# Patient Record
Sex: Female | Born: 1955 | Race: Black or African American | Hispanic: No | Marital: Single | Smoking: Former smoker
Health system: Southern US, Community
[De-identification: ages and names within clinical notes are randomized; demographics above are authoritative.]

## PROBLEM LIST (undated history)

## (undated) DIAGNOSIS — G4733 Obstructive sleep apnea (adult) (pediatric): Secondary | ICD-10-CM

## (undated) DIAGNOSIS — I10 Essential (primary) hypertension: Secondary | ICD-10-CM

## (undated) DIAGNOSIS — K219 Gastro-esophageal reflux disease without esophagitis: Secondary | ICD-10-CM

## (undated) DIAGNOSIS — Z9989 Dependence on other enabling machines and devices: Secondary | ICD-10-CM

## (undated) DIAGNOSIS — Z9289 Personal history of other medical treatment: Secondary | ICD-10-CM

## (undated) DIAGNOSIS — E7800 Pure hypercholesterolemia, unspecified: Secondary | ICD-10-CM

## (undated) DIAGNOSIS — K754 Autoimmune hepatitis: Secondary | ICD-10-CM

## (undated) DIAGNOSIS — M1990 Unspecified osteoarthritis, unspecified site: Secondary | ICD-10-CM

## (undated) DIAGNOSIS — E119 Type 2 diabetes mellitus without complications: Secondary | ICD-10-CM

## (undated) DIAGNOSIS — F419 Anxiety disorder, unspecified: Secondary | ICD-10-CM

## (undated) DIAGNOSIS — Z8489 Family history of other specified conditions: Secondary | ICD-10-CM

## (undated) DIAGNOSIS — Z87442 Personal history of urinary calculi: Secondary | ICD-10-CM

## (undated) DIAGNOSIS — I2510 Atherosclerotic heart disease of native coronary artery without angina pectoris: Secondary | ICD-10-CM

## (undated) DIAGNOSIS — J449 Chronic obstructive pulmonary disease, unspecified: Secondary | ICD-10-CM

## (undated) DIAGNOSIS — D649 Anemia, unspecified: Secondary | ICD-10-CM

## (undated) DIAGNOSIS — J189 Pneumonia, unspecified organism: Secondary | ICD-10-CM

## (undated) DIAGNOSIS — J45909 Unspecified asthma, uncomplicated: Secondary | ICD-10-CM

## (undated) HISTORY — PX: TOTAL KNEE ARTHROPLASTY: SHX125

## (undated) HISTORY — PX: CHOLECYSTECTOMY: SHX55

## (undated) HISTORY — PX: CARDIAC CATHETERIZATION: SHX172

## (undated) HISTORY — PX: JOINT REPLACEMENT: SHX530

## (undated) HISTORY — PX: SHOULDER OPEN ROTATOR CUFF REPAIR: SHX2407

## (undated) HISTORY — PX: CARPAL TUNNEL RELEASE: SHX101

## (undated) HISTORY — PX: BACK SURGERY: SHX140

## (undated) HISTORY — PX: SHOULDER ARTHROSCOPY WITH ROTATOR CUFF REPAIR: SHX5685

## (undated) HISTORY — PX: ABDOMINAL HYSTERECTOMY: SHX81

---

## 2010-05-23 HISTORY — PX: POSTERIOR FUSION LUMBAR SPINE: SUR632

## 2010-05-29 ENCOUNTER — Inpatient Hospital Stay (HOSPITAL_COMMUNITY): Admission: RE | Admit: 2010-05-29 | Discharge: 2010-06-02 | Payer: Self-pay | Admitting: Neurosurgery

## 2010-10-03 LAB — CBC
HCT: 31.8 % — ABNORMAL LOW (ref 36.0–46.0)
Hemoglobin: 10 g/dL — ABNORMAL LOW (ref 12.0–15.0)
MCH: 30 pg (ref 26.0–34.0)
MCHC: 32.3 g/dL (ref 30.0–36.0)
MCV: 92.9 fL (ref 78.0–100.0)
Platelets: 244 10*3/uL (ref 150–400)
RDW: 13.6 % (ref 11.5–15.5)
RDW: 13.9 % (ref 11.5–15.5)
WBC: 10.6 10*3/uL — ABNORMAL HIGH (ref 4.0–10.5)
WBC: 13.8 10*3/uL — ABNORMAL HIGH (ref 4.0–10.5)

## 2010-10-03 LAB — BASIC METABOLIC PANEL
BUN: 8 mg/dL (ref 6–23)
BUN: 9 mg/dL (ref 6–23)
CO2: 24 mEq/L (ref 19–32)
Chloride: 109 mEq/L (ref 96–112)
Creatinine, Ser: 0.76 mg/dL (ref 0.4–1.2)
GFR calc Af Amer: >60 mL/min (ref >60)
GFR calc non Af Amer: >60 mL/min (ref >60)
Potassium: 3.6 mEq/L (ref 3.5–5.1)
Sodium: 140 mEq/L (ref 135–145)

## 2010-10-03 LAB — ABO/RH: ABO/RH(D): O POS

## 2010-10-03 LAB — TYPE AND SCREEN: Antibody Screen: NEGATIVE

## 2010-10-03 LAB — GLUCOSE, CAPILLARY
Glucose-Capillary: 101 mg/dL — ABNORMAL HIGH (ref 70–99)
Glucose-Capillary: 109 mg/dL — ABNORMAL HIGH (ref 70–99)
Glucose-Capillary: 113 mg/dL — ABNORMAL HIGH (ref 70–99)
Glucose-Capillary: 125 mg/dL — ABNORMAL HIGH (ref 70–99)
Glucose-Capillary: 154 mg/dL — ABNORMAL HIGH (ref 70–99)
Glucose-Capillary: 200 mg/dL — ABNORMAL HIGH (ref 70–99)

## 2010-10-03 LAB — CARDIAC PANEL(CRET KIN+CKTOT+MB+TROPI)
CK, MB: 1.3 ng/mL (ref 0.3–4.0)
Relative Index: 0.2 (ref 0.0–2.5)
Relative Index: 0.2 (ref 0.0–2.5)
Total CK: 712 U/L — ABNORMAL HIGH (ref 7–177)
Troponin I: 0.01 ng/mL (ref 0.00–0.06)
Troponin I: 0.02 ng/mL (ref 0.00–0.06)

## 2010-10-04 LAB — CBC
HCT: 38.1 % (ref 36.0–46.0)
Hemoglobin: 12.1 g/dL (ref 12.0–15.0)
MCH: 30.1 pg (ref 26.0–34.0)
MCHC: 31.8 g/dL (ref 30.0–36.0)
RBC: 4.02 MIL/uL (ref 3.87–5.11)

## 2010-10-04 LAB — BASIC METABOLIC PANEL
CO2: 27 mEq/L (ref 19–32)
GFR calc non Af Amer: >60 mL/min (ref >60)
Glucose, Bld: 95 mg/dL (ref 70–99)
Potassium: 4.6 mEq/L (ref 3.5–5.1)
Sodium: 139 mEq/L (ref 135–145)

## 2010-10-04 LAB — SURGICAL PCR SCREEN: MRSA, PCR: NEGATIVE

## 2011-09-14 IMAGING — RF DG LUMBAR SPINE 2-3V
1 series · 2 of 2 positions shown · non-contrast
Comparison: Intraoperative radiographs dated 05/29/2010

CLINICAL DATA: Spondylolisthesis.  Spinal stenosis.

LUMBAR SPINE - 2-3 VIEW

[Series 1: run · 2 of 2 slices shown]
[im 1/2]
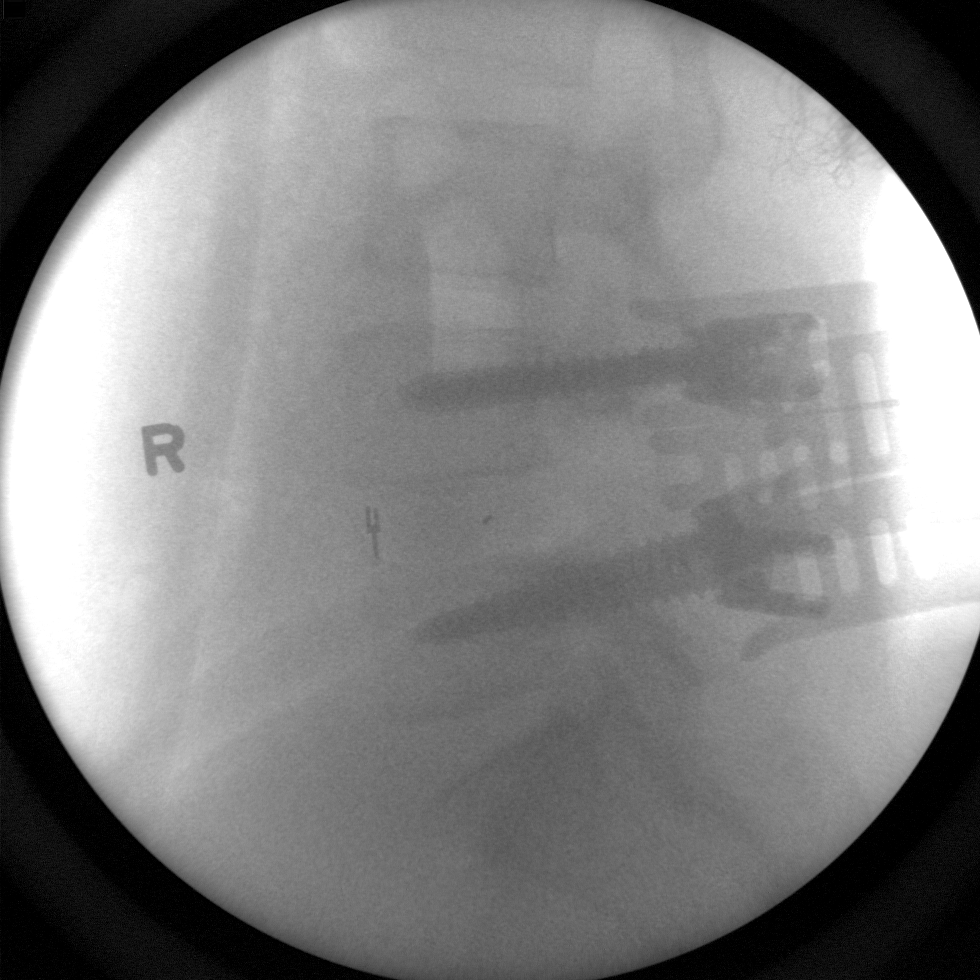
[im 2/2]
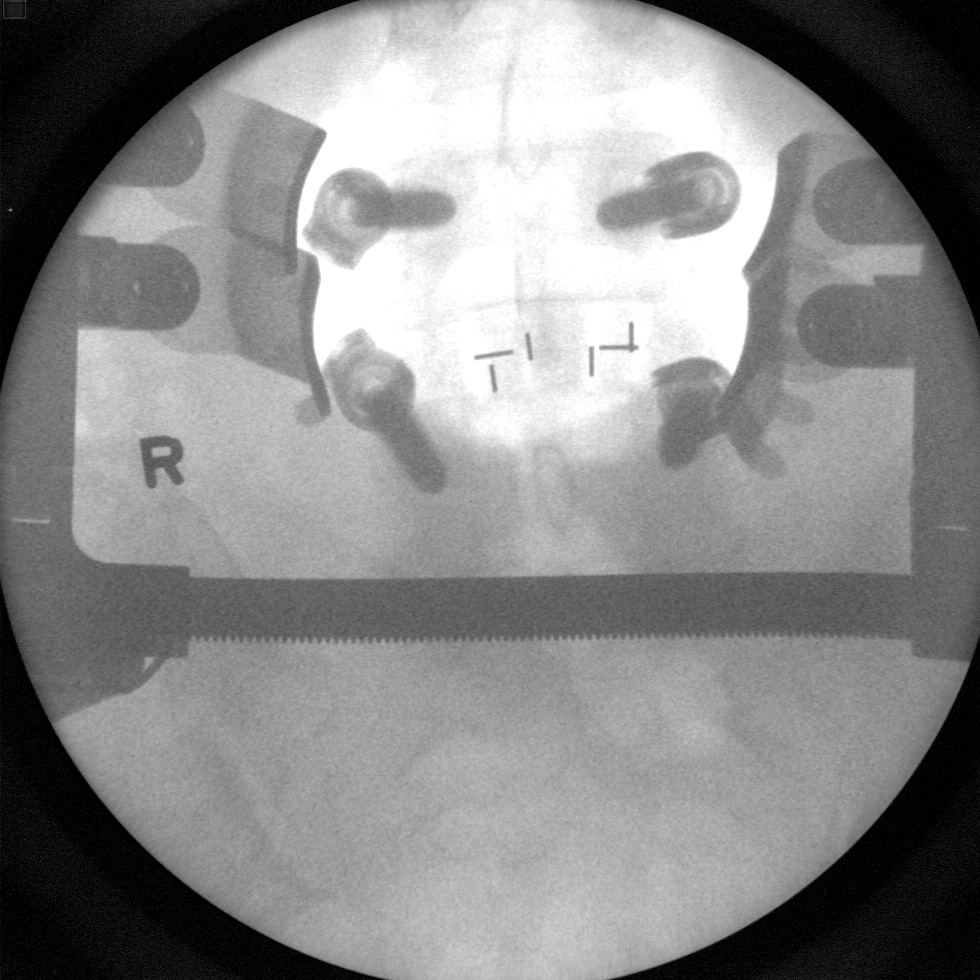

[2 of 2 positions shown; findings below may reference images not displayed]

FINDINGS: AP and lateral C-arm images demonstrate that the patient
has a interbody fusion device and interpedicular screws at L4-5.
Alignment appears anatomic at the L4-5 level.
IMPRESSION: Fusion being performed at L4-5.

## 2011-09-14 IMAGING — CR DG OR PORTABLE SPINE
1 series · 1 of 1 positions shown · non-contrast
Comparison: None.

CLINICAL DATA: L4-L5 laminectomy and PLIF.  Missing sponge.

PORTABLE SPINE

[view not recorded]
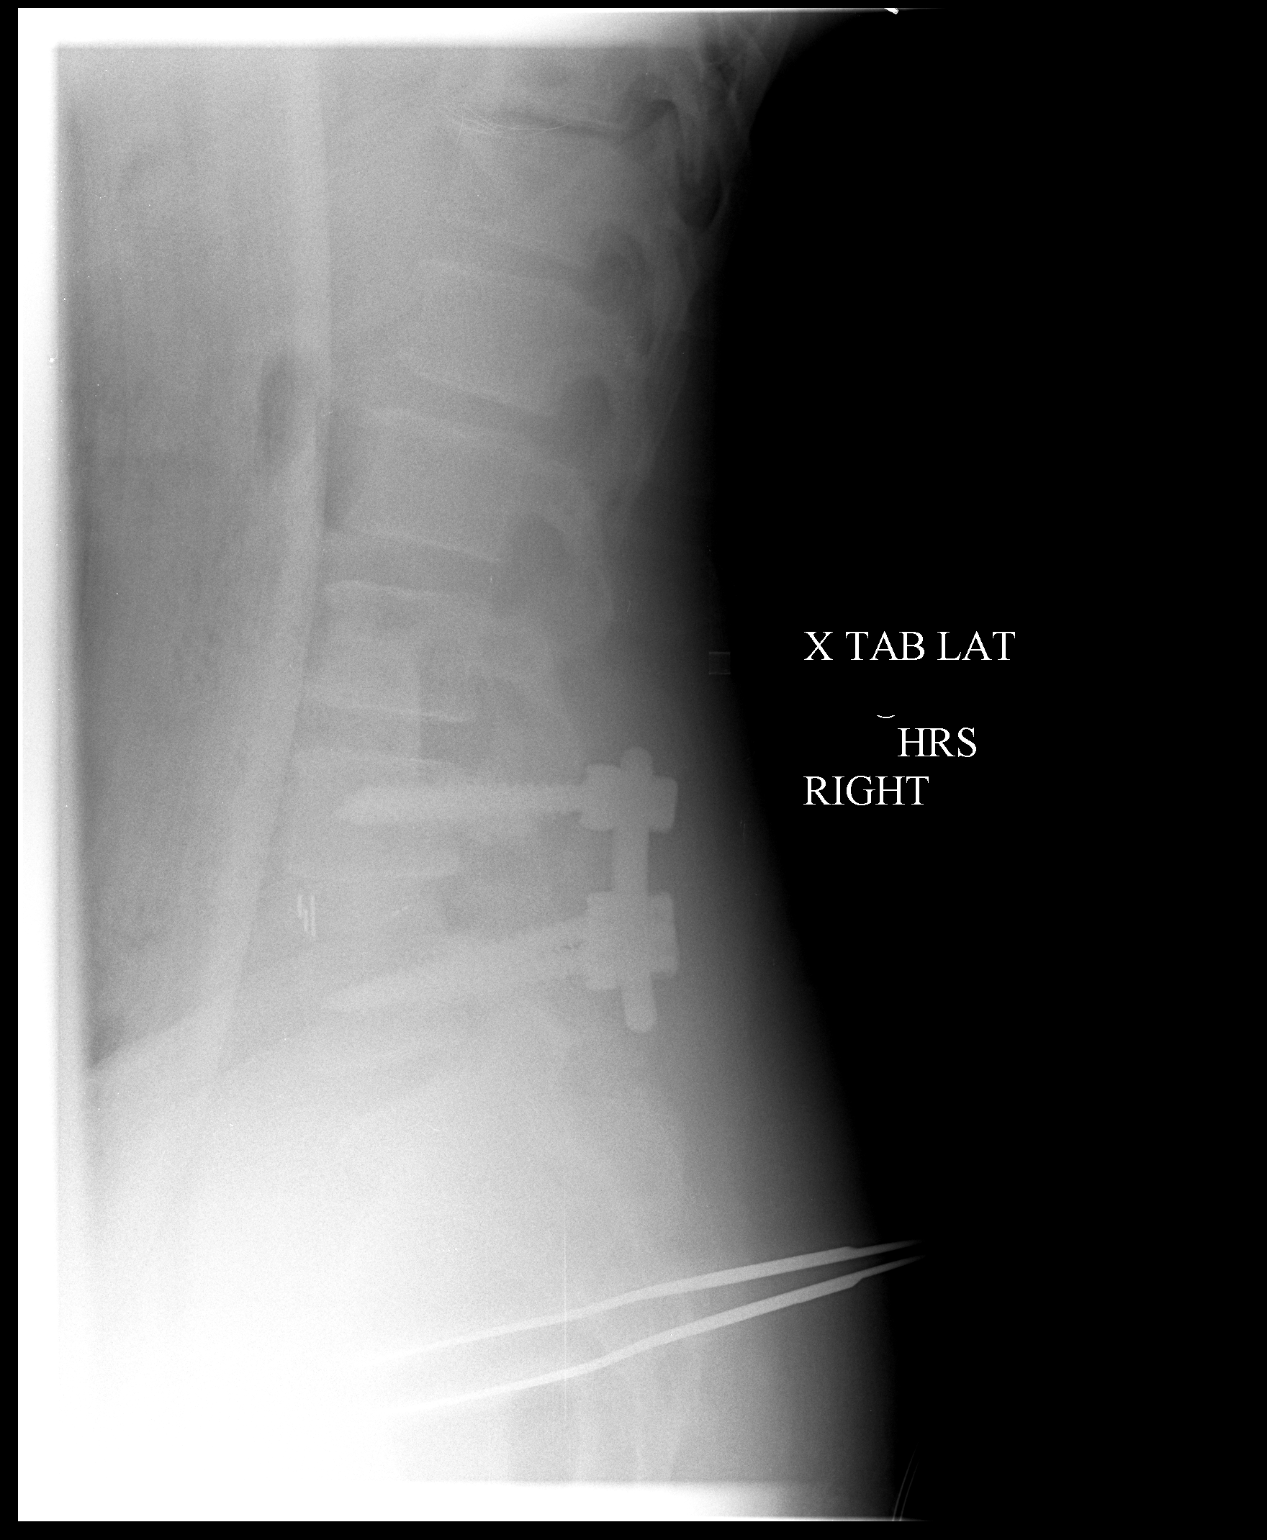

[1 of 1 positions shown; findings below may reference images not displayed]

FINDINGS: Five lumbar type vertebral bodies are assumed.  Cross-
table lateral view at 7995 hours demonstrates posterior lumbar and
interbody fusion at L4-L5.  The hardware appears well positioned.
No retained sponge is identified within the field of view.  An
instrument overlies the sacrum.
IMPRESSION: No evidence of retained surgical sponge status post L4-L5 PLIF.  An
instrument overlies the sacrum.

Results were called to the operating room at the time of
interpretation.  It was reported at that time that the sponge had
been found.

## 2013-07-23 HISTORY — PX: INCISION AND DRAINAGE: SHX5863

## 2013-07-23 HISTORY — PX: REVISION TOTAL KNEE ARTHROPLASTY: SUR1280

## 2014-09-06 ENCOUNTER — Ambulatory Visit: Payer: Self-pay

## 2015-09-27 ENCOUNTER — Encounter: Payer: Self-pay | Admitting: Internal Medicine

## 2015-09-27 ENCOUNTER — Ambulatory Visit (INDEPENDENT_AMBULATORY_CARE_PROVIDER_SITE_OTHER): Payer: Medicaid Other | Admitting: Internal Medicine

## 2015-09-27 VITALS — BP 133/82 | HR 71 | Temp 98.3°F

## 2015-09-27 DIAGNOSIS — T8453XA Infection and inflammatory reaction due to internal right knee prosthesis, initial encounter: Secondary | ICD-10-CM | POA: Diagnosis present

## 2015-09-27 DIAGNOSIS — E7800 Pure hypercholesterolemia, unspecified: Secondary | ICD-10-CM

## 2015-09-27 DIAGNOSIS — Z9049 Acquired absence of other specified parts of digestive tract: Secondary | ICD-10-CM | POA: Diagnosis not present

## 2015-09-27 DIAGNOSIS — I10 Essential (primary) hypertension: Secondary | ICD-10-CM

## 2015-09-27 DIAGNOSIS — I25812 Atherosclerosis of bypass graft of coronary artery of transplanted heart without angina pectoris: Secondary | ICD-10-CM | POA: Diagnosis not present

## 2015-09-27 DIAGNOSIS — I25810 Atherosclerosis of coronary artery bypass graft(s) without angina pectoris: Secondary | ICD-10-CM | POA: Insufficient documentation

## 2015-09-27 MED ORDER — SULFAMETHOXAZOLE-TRIMETHOPRIM 800-160 MG PO TABS: 1.0000 | ORAL_TABLET | Freq: Two times a day (BID) | ORAL | Status: DC

## 2015-09-27 NOTE — Addendum Note (Signed)
Addended by: Rejeana BrockMURRAY, CANDACE A on: 09/27/2015 02:52 PM   Modules accepted: Medications

## 2015-09-27 NOTE — Progress Notes (Addendum)
[facility]      Reason for Consult:prosthetic joint infection    Referring Physician: Dr. Adin Hector    Patient ID: Nancy Simmons, female    DOB: 11-Sep-1955, 60 y.o.   MRN: [id number]  HPI:   She comes in for evaluation of treatment for an early prosthetic joint infection.  She remotely had a right total knee arthroplasty done and then recently had progressively worsening pain, noting sensations of instability and giving way and xray noted some possible loosening.  An initial knee aspirate was negative for infection and she underwent revision right total knee arthroplasty and ORIF of intraoperative medical tibial plateau fracture.  She initially was doing well and in rehab but one day her knee locked while walking and had a 'pop' sensation.  She developed some swelling then noted some blood and pus.  She went to the ED and evaluated and on 08/19/15 underwent 1 stage replacement.  She was started on empiric antibiotics with vanocmycin and then grew Serratia marcescens on culture, resistant to cefazolin and cefoxitin and sensitive to TMP/SMX, ceftriaxone, cefepime, levofloxacin, ertapenem.  She was continued on ceftriaxone and has been on this for about 4 weeks, though I don't have a definitive start date.   Previous record reviewed including the OP report, office notes.   PMHx: CAD, HTN, hypercholesterolemia  Prior to Admission medications   Medication Sig Start Date End Date Taking? Authorizing Provider  apixaban (ELIQUIS) 5 MG TABS tablet Take 5 mg by mouth 2 (two) times daily.   Yes Historical Provider, MD  azathioprine (IMURAN) 100 MG tablet Take 100 mg by mouth daily.   Yes Historical Provider, MD  cefTRIAXone (ROCEPHIN) 2 g SOLR injection Inject 2 g into the vein daily. 09/08/15 10/04/15 Yes Historical Provider, MD  fesoterodine (TOVIAZ) 8 MG TB24 tablet Take 8 mg by mouth daily.   Yes Historical Provider, MD  guaiFENesin-dextromethorphan (ROBITUSSIN DM) 100-10  MG/5ML syrup Take 5 mLs by mouth every 4 (four) hours as needed for cough.   Yes Historical Provider, MD  hydrALAZINE (APRESOLINE) 10 MG tablet Take 10 mg by mouth 2 (two) times daily.   Yes Historical Provider, MD  hydrOXYzine (ATARAX/VISTARIL) 25 MG tablet Take 25 mg by mouth 3 (three) times daily as needed (for hives).   Yes Historical Provider, MD  Ipratropium-Albuterol (COMBIVENT RESPIMAT) 20-100 MCG/ACT AERS respimat Inhale 1 puff into the lungs every 6 (six) hours.   Yes Historical Provider, MD  ipratropium-albuterol (DUONEB) 0.5-2.5 (3) MG/3ML SOLN Take 3 mLs by nebulization every 6 (six) hours as needed (for shortrness of breath).   Yes Historical Provider, MD  lidocaine (LIDODERM) 5 % Place 1 patch onto the skin daily. Remove & Discard patch within 12 hours or as directed by MD   Yes Historical Provider, MD  metFORMIN (GLUCOPHAGE) 500 MG tablet Take 500 mg by mouth 2 (two) times daily with a meal.   Yes Historical Provider, MD  Multiple Vitamins-Minerals (DAILY MULTIVITAMIN PO) Take by mouth 1 day or 1 dose.   Yes Historical Provider, MD  sulfamethoxazole-trimethoprim (BACTRIM DS,SEPTRA DS) 800-160 MG tablet Take 1 tablet by mouth 2 (two) times daily. 09/27/15   Thayer Headings, MD    Allergies not on file  Social History  Substance Use Topics  . Smoking status: Never Smoker   . Smokeless tobacco: None  . Alcohol Use: None    FMHx: cardiac disease  Review of Systems  Constitutional: negative for fevers, chills, malaise and  anorexia Gastrointestinal: negative for diarrhea and abdominal pain Musculoskeletal: negative for myalgias and arthralgias All other systems reviewed and are negative   Constitutional: in no apparent distress and alert  Filed Vitals:   09/27/15 0944  BP: 133/82  Pulse: 71  Temp: 98.3 F (36.8 C)   EYES: anicteric ENMT: Cardiovascular: Cor RRR and No murmurs Respiratory: CTA B; normal respiratory effort GI: Bowel sounds are normal, liver is not  enlarged, spleen is not enlarged Musculoskeletal: peripheral pulses normal, no pedal edema, no clubbing or cyanosis Skin: negatives: no rash Hematologic: no cervical lad  Labs: Lab Results  Component Value Date   WBC 13.8* 06/01/2010   HGB 9.7* 06/01/2010   HCT 30.0* 06/01/2010   MCV 92.9 06/01/2010   PLT 244 06/01/2010    Lab Results  Component Value Date   CREATININE 0.76 06/01/2010   BUN 9 06/01/2010   NA 137 06/01/2010   K 4.2 06/01/2010   CL 109 06/01/2010   CO2 24 06/01/2010   No results found for: ALT, AST, GGT, ALKPHOS, BILITOT, INR   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   Plan: 1) Complete 6 weeks of IV ceftriaxone 2) transition to Bactrim DS 1 tab twice a day for 3-6 months 3)crp, esr, cbc, cmp - had labs today at rehab so will check what was done. 4) rtc about 2 months  Thank you for referral  ADDENDUM: labs from rehab revieiwed, CRP 0.8 and ESR 75, creat, K, wnl.  Ok to transition to Bactrim 1 DS bid after completing 6 weeks.

## 2015-09-28 ENCOUNTER — Telehealth: Payer: Self-pay | Admitting: *Deleted

## 2015-09-28 NOTE — Telephone Encounter (Signed)
[facility] dose and length of therapy.  Reviewed OV notes and confirmed rx.

## 2015-12-06 ENCOUNTER — Ambulatory Visit: Payer: Medicaid Other | Admitting: Internal Medicine

## 2015-12-08 ENCOUNTER — Ambulatory Visit: Payer: Medicaid Other | Admitting: Internal Medicine

## 2016-01-16 ENCOUNTER — Encounter: Payer: Self-pay | Admitting: Internal Medicine

## 2016-01-16 ENCOUNTER — Ambulatory Visit (INDEPENDENT_AMBULATORY_CARE_PROVIDER_SITE_OTHER): Payer: Medicaid Other | Admitting: Internal Medicine

## 2016-01-16 VITALS — BP 118/79 | HR 91 | Temp 98.4°F | Wt 236.0 lb

## 2016-01-16 DIAGNOSIS — T8453XA Infection and inflammatory reaction due to internal right knee prosthesis, initial encounter: Secondary | ICD-10-CM | POA: Diagnosis not present

## 2016-01-16 NOTE — Progress Notes (Signed)
[facility]      Reason for Consult:prosthetic joint infection    Referring Physician: Dr. Mardene SpeakHubler    Patient ID: Nancy Simmons, female    DOB: 02/27/1956, 60 y.o.   MRN: [id number]  HPI:   She comes in for evaluation of treatment for an early prosthetic joint infection.  She remotely had a right total knee arthroplasty done and then recently had progressively worsening pain, noting sensations of instability and giving way and xray noted some possible loosening.  An initial knee aspirate was negative for infection and she underwent revision right total knee arthroplasty and ORIF of intraoperative medical tibial plateau fracture.  She initially was doing well and in rehab but one day her knee locked while walking and had a 'pop' sensation.  She developed some swelling then noted some blood and pus.  She went to the ED and evaluated and on 08/19/15 underwent 1 stage replacement.  She was started on empiric antibiotics with vanocmycin and then grew Serratia marcescens on culture, resistant to cefazolin and cefoxitin and sensitive to TMP/SMX, ceftriaxone, cefepime, levofloxacin, ertapenem.  She was continued on ceftriaxone and has been on this for about 4 weeks, though I don't have a definitive start date.   Previous record reviewed including the OP report, office notes.   PMHx: CAD, HTN, hypercholesterolemia  Prior to Admission medications   Medication Sig Start Date End Date Taking? Authorizing Provider  apixaban (ELIQUIS) 5 MG TABS tablet Take 5 mg by mouth 2 (two) times daily.   Yes Historical Provider, MD  azathioprine (IMURAN) 100 MG tablet Take 100 mg by mouth daily.   Yes Historical Provider, MD  cefTRIAXone (ROCEPHIN) 2 g SOLR injection Inject 2 g into the vein daily. 09/08/15 10/04/15 Yes Historical Provider, MD  fesoterodine (TOVIAZ) 8 MG TB24 tablet Take 8 mg by mouth daily.   Yes Historical Provider, MD  guaiFENesin-dextromethorphan (ROBITUSSIN DM) 100-10  MG/5ML syrup Take 5 mLs by mouth every 4 (four) hours as needed for cough.   Yes Historical Provider, MD  hydrALAZINE (APRESOLINE) 10 MG tablet Take 10 mg by mouth 2 (two) times daily.   Yes Historical Provider, MD  hydrOXYzine (ATARAX/VISTARIL) 25 MG tablet Take 25 mg by mouth 3 (three) times daily as needed (for hives).   Yes Historical Provider, MD  Ipratropium-Albuterol (COMBIVENT RESPIMAT) 20-100 MCG/ACT AERS respimat Inhale 1 puff into the lungs every 6 (six) hours.   Yes Historical Provider, MD  ipratropium-albuterol (DUONEB) 0.5-2.5 (3) MG/3ML SOLN Take 3 mLs by nebulization every 6 (six) hours as needed (for shortrness of breath).   Yes Historical Provider, MD  lidocaine (LIDODERM) 5 % Place 1 patch onto the skin daily. Remove & Discard patch within 12 hours or as directed by MD   Yes Historical Provider, MD  metFORMIN (GLUCOPHAGE) 500 MG tablet Take 500 mg by mouth 2 (two) times daily with a meal.   Yes Historical Provider, MD  Multiple Vitamins-Minerals (DAILY MULTIVITAMIN PO) Take by mouth 1 day or 1 dose.   Yes Historical Provider, MD  sulfamethoxazole-trimethoprim (BACTRIM DS,SEPTRA DS) 800-160 MG tablet Take 1 tablet by mouth 2 (two) times daily. 09/27/15   Gardiner Barefootobert W Felder Lebeda, MD    Allergies  Allergen Reactions  . Penicillin G Rash    Social History  Substance Use Topics  . Smoking status: Never Simmons   . Smokeless tobacco: None  . Alcohol Use: None    FMHx: cardiac disease  Review of Systems  Constitutional: negative for fevers, chills, malaise and anorexia  Constitutional: in no apparent distress and alert  Filed Vitals:   01/16/16 1341  BP: 118/79  Pulse: 91  Temp: 98.4 F (36.9 C)   Musculoskeletal: peripheral pulses normal, no pedal edema, no clubbing or cyanosis Skin: negatives: no rash Hematologic: no cervical lad  Labs: Lab Results  Component Value Date   WBC 13.8* 06/01/2010   HGB 9.7* 06/01/2010   HCT 30.0* 06/01/2010   MCV 92.9 06/01/2010   PLT 244  06/01/2010    Lab Results  Component Value Date   CREATININE 0.76 06/01/2010   BUN 9 06/01/2010   NA 137 06/01/2010   K 4.2 06/01/2010   CL 109 06/01/2010   CO2 24 06/01/2010   No results found for: ALT, AST, GGT, ALKPHOS, BILITOT, INR   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 Now has completed 6 weeks of IV therapy and 3 months of oral continuation in about 2 weeks.  She will finish out the three months and stop. She already had labs by Dr. Mardene SpeakHubler and will defer if there are any concerns on the labs, otherwise can stop.  Plan: Finish antibiotics and stop at the end of three months.  RTC PRN

## 2016-05-10 ENCOUNTER — Ambulatory Visit (INDEPENDENT_AMBULATORY_CARE_PROVIDER_SITE_OTHER): Payer: Medicaid Other | Admitting: Internal Medicine

## 2016-05-10 ENCOUNTER — Telehealth: Payer: Self-pay

## 2016-05-10 VITALS — BP 117/78 | HR 62 | Temp 98.4°F | Wt 234.0 lb

## 2016-05-10 DIAGNOSIS — T8453XA Infection and inflammatory reaction due to internal right knee prosthesis, initial encounter: Secondary | ICD-10-CM | POA: Diagnosis not present

## 2016-05-10 DIAGNOSIS — Z23 Encounter for immunization: Secondary | ICD-10-CM

## 2016-05-10 MED ORDER — SULFAMETHOXAZOLE-TRIMETHOPRIM 800-160 MG PO TABS: 2.0000 | ORAL_TABLET | Freq: Two times a day (BID) | ORAL | 1 refills | Status: DC

## 2016-05-10 MED ORDER — METRONIDAZOLE 500 MG PO TABS: 500.0000 mg | ORAL_TABLET | Freq: Three times a day (TID) | ORAL | 1 refills | Status: DC

## 2016-05-10 NOTE — Telephone Encounter (Signed)
Called Dr. Jose PersiaHubler's medical assistant Jasmine DecemberSharon and left a message requesting any recent blood work drawn on patient per Dr. Ephriam Knucklesomer's request. Clinic already has knee biopsy results. Left message to fax over information or to call triage. Rejeana Brockandace Meldrick Buttery, LPN

## 2016-05-10 NOTE — Progress Notes (Signed)
Patient ID: Nancy Simmons, female   DOB: 09/01/1955, 60 y.o.   MRN: [id number]    [facility]      Reason for Consult:prosthetic joint infection    Referring Physician: Dr. Adin Hector    Patient ID: Nancy Simmons, female    DOB: October 12, 1955, 60 y.o.   MRN: [id number]  HPI:   She comes in for follow up of early prosthetic joint infection.   She remotely had a right total knee arthroplasty done and then recently had progressively worsening pain, noting sensations of instability and giving way and xray noted some possible loosening.  An initial knee aspirate was negative for infection and she underwent revision right total knee arthroplasty and ORIF of intraoperative medical tibial plateau fracture.  She initially was doing well and in rehab but one day her knee locked while walking and had a 'pop' sensation.  She developed some swelling then noted some blood and pus.  She went to the ED and evaluated and on 08/19/15 underwent 1 stage replacement.  She was started on empiric antibiotics with vanocmycin and then grew Serratia marcescens on culture, resistant to cefazolin and cefoxitin and sensitive to TMP/SMX, ceftriaxone, cefepime, levofloxacin, ertapenem.  She was continued on ceftriaxone and completed it and continued with Bactrim for 3 months.     Since then, she apparently had some increased inflammatory markers and concern for continued infection and had an aspiration of the knee done at [facility] which was notable for 47,593 WBCs and culture with an anaeorobic Finegoldia magna.  She was placed on empiric Bactrim again by Dr. Adin Hector and back here today. She also has an appt with Dr. Redmond Pulling of orthopedics at [facility] at the end of the month.    PMHx: CAD, HTN, hypercholesterolemia  Prior to Admission medications   Medication Sig Start Date End Date Taking? Authorizing Provider  apixaban (ELIQUIS) 5 MG TABS tablet Take 5 mg by mouth 2 (two) times daily.   Yes Historical  Provider, MD  azathioprine (IMURAN) 100 MG tablet Take 100 mg by mouth daily.   Yes Historical Provider, MD  cefTRIAXone (ROCEPHIN) 2 g SOLR injection Inject 2 g into the vein daily. 09/08/15 10/04/15 Yes Historical Provider, MD  fesoterodine (TOVIAZ) 8 MG TB24 tablet Take 8 mg by mouth daily.   Yes Historical Provider, MD  guaiFENesin-dextromethorphan (ROBITUSSIN DM) 100-10 MG/5ML syrup Take 5 mLs by mouth every 4 (four) hours as needed for cough.   Yes Historical Provider, MD  hydrALAZINE (APRESOLINE) 10 MG tablet Take 10 mg by mouth 2 (two) times daily.   Yes Historical Provider, MD  hydrOXYzine (ATARAX/VISTARIL) 25 MG tablet Take 25 mg by mouth 3 (three) times daily as needed (for hives).   Yes Historical Provider, MD  Ipratropium-Albuterol (COMBIVENT RESPIMAT) 20-100 MCG/ACT AERS respimat Inhale 1 puff into the lungs every 6 (six) hours.   Yes Historical Provider, MD  ipratropium-albuterol (DUONEB) 0.5-2.5 (3) MG/3ML SOLN Take 3 mLs by nebulization every 6 (six) hours as needed (for shortrness of breath).   Yes Historical Provider, MD  lidocaine (LIDODERM) 5 % Place 1 patch onto the skin daily. Remove & Discard patch within 12 hours or as directed by MD   Yes Historical Provider, MD  metFORMIN (GLUCOPHAGE) 500 MG tablet Take 500 mg by mouth 2 (two) times daily with a meal.   Yes Historical Provider, MD  Multiple Vitamins-Minerals (DAILY MULTIVITAMIN PO) Take by mouth 1 day or 1 dose.   Yes Historical Provider,  MD  sulfamethoxazole-trimethoprim (BACTRIM DS,SEPTRA DS) 800-160 MG tablet Take 1 tablet by mouth 2 (two) times daily. 09/27/15   Thayer Headings, MD    Allergies  Allergen Reactions  . Penicillin G Rash    Social History  Substance Use Topics  . Smoking status: Never Smoker  . Smokeless tobacco: Not on file  . Alcohol use Not on file    FMHx: cardiac disease  Review of Systems  Constitutional: negative for fevers, chills, malaise and anorexia MS: denies any significant pain with  walking  Constitutional: in no apparent distress and alert  Vitals:   05/10/16 1019  BP: 117/78  Pulse: 62  Temp: 98.4 F (36.9 C)   Musculoskeletal: peripheral pulses normal, no pedal edema, no clubbing or cyanosis; right knee is notably warm compared to other knee, rest of leg; minimal edema but difficult to assess with size; Skin: negatives: no rash Hematologic: no cervical lad  Labs: Lab Results  Component Value Date   WBC 13.8 (H) 06/01/2010   HGB 9.7 (L) 06/01/2010   HCT 30.0 (L) 06/01/2010   MCV 92.9 06/01/2010   PLT 244 06/01/2010    Lab Results  Component Value Date   CREATININE 0.76 06/01/2010   BUN 9 06/01/2010   NA 137 06/01/2010   K 4.2 06/01/2010   CL 109 06/01/2010   CO2 24 06/01/2010   No results found for: ALT, AST, GGT, ALKPHOS, BILITOT, INR   Assessment: new onset of infection vs recurrence, exacerbation.  It did not grow Serratia but concerning that it still is.  It did grow an unusual anaerobe but I doubt its significance but worth covering as well.  My concern is that she will need debridement and prosthetic knee removal if she does not improve and prolonged IV antibiotics again after debridment.  At this time, IV antibiotics not indicated.  She had recent labs by Dr. Adin Hector so will get a copy of recent CRP and ESR.    Plan:  1) continue bactrim but will make higher dose 2 DS tabs twice a day 2) add flagyl TID 3) get recent labs 4) follow up in 3-4 weeks

## 2016-06-07 ENCOUNTER — Ambulatory Visit (INDEPENDENT_AMBULATORY_CARE_PROVIDER_SITE_OTHER): Payer: Medicaid Other | Admitting: Internal Medicine

## 2016-06-07 ENCOUNTER — Encounter: Payer: Self-pay | Admitting: Internal Medicine

## 2016-06-07 VITALS — BP 118/78 | HR 76 | Temp 98.8°F | Ht 62.0 in | Wt 228.0 lb

## 2016-06-07 DIAGNOSIS — T8453XD Infection and inflammatory reaction due to internal right knee prosthesis, subsequent encounter: Secondary | ICD-10-CM

## 2016-06-07 LAB — BASIC METABOLIC PANEL
BUN: 13 mg/dL (ref 7–25)
CHLORIDE: 105 mmol/L (ref 98–110)
CO2: 21 mmol/L (ref 20–31)
Calcium: 9.5 mg/dL (ref 8.6–10.4)
Creat: 0.72 mg/dL (ref 0.50–0.99)
Glucose, Bld: 90 mg/dL (ref 65–99)
POTASSIUM: 4.4 mmol/L (ref 3.5–5.3)
SODIUM: 134 mmol/L — AB (ref 135–146)

## 2016-06-07 NOTE — Progress Notes (Addendum)
Patient ID: Nancy Simmons, female   DOB: [birth year], 60 y.o.   MRN: [id number]    [facility]      Reason for Consult:prosthetic joint infection    Referring Physician: Dr. Adin Simmons    Patient ID: Nancy Simmons, female    DOB: 1955-12-21, 60 y.o.   MRN: [id number]  HPI:   She comes in for follow up of early prosthetic joint infection.   She remotely had a right total knee arthroplasty done and then recently had progressively worsening pain, noting sensations of instability and giving way and xray noted some possible loosening.  An initial knee aspirate was negative for infection and she underwent revision right total knee arthroplasty and ORIF of intraoperative medical tibial plateau fracture.  She initially was doing well and in rehab but one day her knee locked while walking and had a 'pop' sensation.  She developed some swelling then noted some blood and pus.  She went to the ED and evaluated and on 08/19/15 underwent 1 stage replacement.  She was started on empiric antibiotics with vanocmycin and then grew Serratia marcescens on culture, resistant to cefazolin and cefoxitin and sensitive to TMP/SMX, ceftriaxone, cefepime, levofloxacin, ertapenem.  She was continued on ceftriaxone and completed it and continued with Bactrim for 3 months.     Since then, she apparently had some increased inflammatory markers and concern for continued infection and had an aspiration of the knee done at [facility] which was notable for 47,593 WBCs and culture with an anaeorobic Finegoldia magna.  She was placed on empiric Bactrim again by Dr. Adin Simmons and with growth I increased her Bactrim to 2DS twice a day and added flagyl.  No issues with taking medication.  She feels her knee is much better.  She also has an appt with Dr. Redmond Simmons of orthopedics at [facility] at the end of this month.   PMHx: CAD, HTN, hypercholesterolemia  Prior to Admission medications   Medication Sig Start Date End Date  Taking? Authorizing Provider  apixaban (ELIQUIS) 5 MG TABS tablet Take 5 mg by mouth 2 (two) times daily.   Yes Historical Provider, MD  azathioprine (IMURAN) 100 MG tablet Take 100 mg by mouth daily.   Yes Historical Provider, MD  cefTRIAXone (ROCEPHIN) 2 g SOLR injection Inject 2 g into the vein daily. 09/08/15 10/04/15 Yes Historical Provider, MD  fesoterodine (TOVIAZ) 8 MG TB24 tablet Take 8 mg by mouth daily.   Yes Historical Provider, MD  guaiFENesin-dextromethorphan (ROBITUSSIN DM) 100-10 MG/5ML syrup Take 5 mLs by mouth every 4 (four) hours as needed for cough.   Yes Historical Provider, MD  hydrALAZINE (APRESOLINE) 10 MG tablet Take 10 mg by mouth 2 (two) times daily.   Yes Historical Provider, MD  hydrOXYzine (ATARAX/VISTARIL) 25 MG tablet Take 25 mg by mouth 3 (three) times daily as needed (for hives).   Yes Historical Provider, MD  Ipratropium-Albuterol (COMBIVENT RESPIMAT) 20-100 MCG/ACT AERS respimat Inhale 1 puff into the lungs every 6 (six) hours.   Yes Historical Provider, MD  ipratropium-albuterol (DUONEB) 0.5-2.5 (3) MG/3ML SOLN Take 3 mLs by nebulization every 6 (six) hours as needed (for shortrness of breath).   Yes Historical Provider, MD  lidocaine (LIDODERM) 5 % Place 1 patch onto the skin daily. Remove & Discard patch within 12 hours or as directed by MD   Yes Historical Provider, MD  metFORMIN (GLUCOPHAGE) 500 MG tablet Take 500 mg by mouth 2 (two) times daily with a meal.  Yes Historical Provider, MD  Multiple Vitamins-Minerals (DAILY MULTIVITAMIN PO) Take by mouth 1 day or 1 dose.   Yes Historical Provider, MD  sulfamethoxazole-trimethoprim (BACTRIM DS,SEPTRA DS) 800-160 MG tablet Take 1 tablet by mouth 2 (two) times daily. 09/27/15   Thayer Headings, MD    Allergies  Allergen Reactions  . Penicillin G Rash    Social History  Substance Use Topics  . Smoking status: Current Every Day Smoker    Packs/day: 0.50    Types: Cigarettes    Start date: 07/23/1972  . Smokeless  tobacco: Current User  . Alcohol use Not on file    FMHx: cardiac disease  Review of Systems  Constitutional: negative for fevers, chills, malaise and anorexia MS: denies any significant pain with walking  Constitutional: in no apparent distress and alert  Vitals:   06/07/16 1047  BP: 118/78  Pulse: 76  Temp: 98.8 F (37.1 C)   Musculoskeletal: peripheral pulses normal, no pedal edema, no clubbing or cyanosis; right knee is improved with no swelling or warmth now Skin: negatives: no rash Hematologic: no cervical lad  Labs: Lab Results  Component Value Date   WBC 13.8 (H) 06/01/2010   HGB 9.7 (L) 06/01/2010   HCT 30.0 (L) 06/01/2010   MCV 92.9 06/01/2010   PLT 244 06/01/2010    Lab Results  Component Value Date   CREATININE 0.76 06/01/2010   BUN 9 06/01/2010   NA 137 06/01/2010   K 4.2 06/01/2010   CL 109 06/01/2010   CO2 24 06/01/2010   No results found for: ALT, AST, GGT, ALKPHOS, BILITOT, INR   Assessment: new onset of infection vs recurrence, exacerbation.  It did not grow Serratia this past time but concerning that it still is.  It did grow an unusual anaerobe but I doubt its significance but worth covering as well.  My concern is that she will need debridement and prosthetic knee removal if she does not improve and prolonged IV antibiotics again after debridment.  I have had her on one month of treatment and it has improved.  I am still worried that after stopping, her knee will again flare.    Plan:  1) continue bactrim and flagyl for another month 2) crp, esr 3) check bmp on bactrim for medication monitoring 4) rtc 4 weeks and consider stopping to see what happens.  She also has an appt with an orthopedist during that time  ADDENDUM:  ESR noted and is over 100 despite treatment.  I am concerned her infection is only being suppressed and she needs 1 or 2 stage revision.  I do not feel IV antibiotics will help without surgical managemtn.  She is seeing  orthopedics at [facility] soon for consideration of that.  Will continue with same antibiotics.

## 2016-06-08 LAB — C-REACTIVE PROTEIN: CRP: 5.6 mg/L (ref <8.0)

## 2016-06-08 LAB — SEDIMENTATION RATE: Sed Rate: 115 mm/hr — ABNORMAL HIGH (ref 0–30)

## 2016-07-09 ENCOUNTER — Encounter: Payer: Self-pay | Admitting: Internal Medicine

## 2016-07-09 ENCOUNTER — Ambulatory Visit (INDEPENDENT_AMBULATORY_CARE_PROVIDER_SITE_OTHER): Payer: Medicaid Other | Admitting: Internal Medicine

## 2016-07-09 VITALS — BP 150/85 | HR 99 | Temp 97.9°F | Ht 62.0 in | Wt 229.0 lb

## 2016-07-09 DIAGNOSIS — T8453XD Infection and inflammatory reaction due to internal right knee prosthesis, subsequent encounter: Secondary | ICD-10-CM | POA: Diagnosis present

## 2016-07-09 DIAGNOSIS — Z5181 Encounter for therapeutic drug level monitoring: Secondary | ICD-10-CM

## 2016-07-09 DIAGNOSIS — K754 Autoimmune hepatitis: Secondary | ICD-10-CM | POA: Diagnosis not present

## 2016-07-09 LAB — CBC WITH DIFFERENTIAL/PLATELET
Basophils Absolute: 43 cells/uL (ref 0–200)
Basophils Relative: 1 %
EOS ABS: 86 {cells}/uL (ref 15–500)
Eosinophils Relative: 2 %
HEMATOCRIT: 34.5 % — AB (ref 35.0–45.0)
Hemoglobin: 10.9 g/dL — ABNORMAL LOW (ref 11.7–15.5)
LYMPHS PCT: 37 %
Lymphs Abs: 1591 cells/uL (ref 850–3900)
MCH: 30.4 pg (ref 27.0–33.0)
MCHC: 31.6 g/dL — AB (ref 32.0–36.0)
MCV: 96.1 fL (ref 80.0–100.0)
MONO ABS: 645 {cells}/uL (ref 200–950)
MPV: 9.9 fL (ref 7.5–12.5)
Monocytes Relative: 15 %
NEUTROS PCT: 45 %
Neutro Abs: 1935 cells/uL (ref 1500–7800)
Platelets: 412 10*3/uL — ABNORMAL HIGH (ref 140–400)
RBC: 3.59 MIL/uL — AB (ref 3.80–5.10)
RDW: 17.4 % — AB (ref 11.0–15.0)
WBC: 4.3 10*3/uL (ref 3.8–10.8)

## 2016-07-09 MED ORDER — SULFAMETHOXAZOLE-TRIMETHOPRIM 800-160 MG PO TABS: 2.0000 | ORAL_TABLET | Freq: Two times a day (BID) | ORAL | 0 refills | Status: DC

## 2016-07-09 MED ORDER — METRONIDAZOLE 500 MG PO TABS: 500.0000 mg | ORAL_TABLET | Freq: Three times a day (TID) | ORAL | 0 refills | Status: DC

## 2016-07-09 NOTE — Assessment & Plan Note (Addendum)
doing well but I will continue for 1 more month of antibiotics with elevated inflammatory markers. Though certainly with autoimmune hepatitis, this may simply be related to that and elevation is not due to the knee.  I am concerned that after antibiotics her knee will again flare and needs more definitive surgical care (ie 1 or 2 stage removal).

## 2016-07-09 NOTE — Assessment & Plan Note (Signed)
Will check creat, K today and continue if ok.

## 2016-07-09 NOTE — Progress Notes (Signed)
   Subjective:    Patient ID: Nancy Simmons, female    DOB: 10/29/1955, 60 y.o.   MRN: [id number]  HPI Here for follow up of PJI.  She remotely had a right total knee arthroplasty done and then recently had progressively worsening pain, noting sensations of instability and giving way and xray noted some possible loosening.  An initial knee aspirate was negative for infection and she underwent revision right total knee arthroplasty and ORIF of intraoperative medical tibial plateau fracture.  She initially was doing well and in rehab but one day her knee locked while walking and had a 'pop' sensation.  She developed some swelling then noted some blood and pus.  She went to the ED and evaluated and on 08/19/15 underwent 1 stage replacement.  She was started on empiric antibiotics with vanocmycin and then grew Serratia marcescens on culture, resistant to cefazolin and cefoxitin and sensitive to TMP/SMX, ceftriaxone, cefepime, levofloxacin, ertapenem.  She was continued on ceftriaxone and completed it and continued with Bactrim for 3 months.     Since then, she has had some increased inflammatory markers and concern for continued infection and had an aspiration of the knee done at [facility] in October 2017 which was notable for 47,593 WBCs and culture with an anaeorobic Finegoldia magna.  She was placed on empiric Bactrim again by Dr. Mardene SpeakHubler and with growth I increased her Bactrim to 2DS twice a day and added flagyl.  No issues with taking medication.  She feels her knee is much better and even compared to last visit is not warm and knot is gone and she is walking better.  As noted though her inflammatory markers remained quite elevated despite clinically improving.   She has an appt with another orthopedist in [place] in January.    Review of Systems  Constitutional: Negative for fatigue and fever.  Gastrointestinal: Negative for diarrhea and nausea.  Genitourinary: Negative for dysuria.  Skin:  Negative for rash.  Neurological: Negative for dizziness.       Objective:   Physical Exam  Constitutional: She appears well-developed and well-nourished. No distress.  Eyes: No scleral icterus.  Cardiovascular: Normal rate, regular rhythm and normal heart sounds.   Musculoskeletal:  Knee without warmth, no signficant swelling  Skin: No rash noted.   Social History   Social History  . Marital status: Single    Spouse name: N/A  . Number of children: N/A  . Years of education: N/A   Occupational History  . Not on file.   Social History Main Topics  . Smoking status: Current Every Day Smoker    Packs/day: 0.50    Types: Cigarettes    Start date: 07/23/1972  . Smokeless tobacco: Never Used  . Alcohol use No  . Drug use: No  . Sexual activity: Not Currently   Other Topics Concern  . Not on file   Social History Narrative  . No narrative on file         Assessment & Plan:

## 2016-07-09 NOTE — Assessment & Plan Note (Signed)
On azathioprine.

## 2016-07-10 LAB — BASIC METABOLIC PANEL
BUN: 13 mg/dL (ref 7–25)
CALCIUM: 9.8 mg/dL (ref 8.6–10.4)
CO2: 23 mmol/L (ref 20–31)
CREATININE: 0.79 mg/dL (ref 0.50–0.99)
Chloride: 101 mmol/L (ref 98–110)
GLUCOSE: 77 mg/dL (ref 65–99)
POTASSIUM: 4.5 mmol/L (ref 3.5–5.3)
Sodium: 134 mmol/L — ABNORMAL LOW (ref 135–146)

## 2016-07-10 LAB — C-REACTIVE PROTEIN: CRP: 8.7 mg/L — ABNORMAL HIGH (ref <8.0)

## 2016-07-10 LAB — SEDIMENTATION RATE: Sed Rate: 90 mm/hr — ABNORMAL HIGH (ref 0–30)

## 2016-07-13 ENCOUNTER — Telehealth: Payer: Self-pay

## 2016-07-13 NOTE — Telephone Encounter (Signed)
States that in Oct Dr.Comer gave her an Rx for an antibiotic and switch the dosage on the 18th of Dec and wanted to know if that was if she should take it as written or take it like she had in October. I told her to take Rx as written this last time. I asked if she was having any adverse reactions and Pt denied. I reviewed her chart and saw where the Dr also Rx'd Flagyl as well. I will speak with the RN on duty to advise and call Pt back with what to do next.

## 2016-07-13 NOTE — Telephone Encounter (Signed)
Pt called stating that during her last visit Dr. Luciana Axeomer changed her Rx for Bactrim from taking 1 tablet 2 times a day to taking 2 tablets 2 times a day and wanted to know if it the change was correct. After reviewing the chart I advised Pt to take 2 tablets 2 times a day as directed. I asked Pt is she was having any new symptoms or otherwise adverse reactions to the medication change. Pt denied but mentioned that she's having burning during urination and thinks she may have a yeast infection and denies discharge or odors. I asked the Pt if she thinks it might be an UTI and she stated that that might be a possibility as well. I advised the Pt that she couldn't be diagnosed over the phone and would need to be seen by a Dr to find out. I will call the Pt back and advise her to be seen by her PCP. Spoke with Pt a second time advising to be seen by her PCP and she agreed to comply

## 2016-07-23 HISTORY — PX: CARDIAC CATHETERIZATION: SHX172

## 2016-08-06 ENCOUNTER — Ambulatory Visit: Payer: Medicaid Other | Admitting: Internal Medicine

## 2016-08-27 ENCOUNTER — Ambulatory Visit (INDEPENDENT_AMBULATORY_CARE_PROVIDER_SITE_OTHER): Payer: Medicaid Other | Admitting: Internal Medicine

## 2016-08-27 ENCOUNTER — Encounter: Payer: Self-pay | Admitting: Internal Medicine

## 2016-08-27 DIAGNOSIS — K754 Autoimmune hepatitis: Secondary | ICD-10-CM | POA: Diagnosis present

## 2016-08-27 DIAGNOSIS — T8453XD Infection and inflammatory reaction due to internal right knee prosthesis, subsequent encounter: Secondary | ICD-10-CM | POA: Diagnosis not present

## 2016-08-28 NOTE — Assessment & Plan Note (Signed)
This seems to have resolved, she has been off of antibiotics and doing well.  From an ID standpoint, I think it is ok to proceed to hip replacement if/when indicated.

## 2016-08-28 NOTE — Assessment & Plan Note (Signed)
I suspect this is why her inflammatory markers have remained elevated.

## 2016-08-28 NOTE — Progress Notes (Signed)
   Subjective:    Patient ID: Nancy Simmons, female    DOB: 02/04/1956, 61 y.o.   MRN: [id number]  HPI Here for follow up of PJI.  She remotely had a right total knee arthroplasty done and then recently had progressively worsening pain, noting sensations of instability and giving way and xray noted some possible loosening.  An initial knee aspirate was negative for infection and she underwent revision right total knee arthroplasty and ORIF of intraoperative medical tibial plateau fracture.  She initially was doing well and in rehab but one day her knee locked while walking and had a 'pop' sensation.  She developed some swelling then noted some blood and pus.  She went to the ED and evaluated and on 08/19/15 underwent 1 stage replacement.  She was started on empiric antibiotics with vanocmycin and then grew Serratia marcescens on culture, resistant to cefazolin and cefoxitin and sensitive to TMP/SMX, ceftriaxone, cefepime, levofloxacin, ertapenem.  She was continued on ceftriaxone and completed it and continued with Bactrim for 3 months.     Since then, she has had some increased inflammatory markers and concern for continued infection and had an aspiration of the knee done at [facility] in October 2017 which was notable for 47,593 WBCs and culture with an anaeorobic Finegoldia magna.  She was placed on empiric Bactrim again by Dr. Mardene SpeakHubler and with growth I increased her Bactrim to 2DS twice a day and added flagyl.  She now has completed about 3 months and has been off of antibiotics about 2 weeks.  Her knee feels well with no swelling, no warmth.   She is hopeful to get a hip replacement soon.       Review of Systems  Constitutional: Negative for fatigue and fever.  Gastrointestinal: Negative for diarrhea and nausea.  Genitourinary: Negative for dysuria.  Skin: Negative for rash.  Neurological: Negative for dizziness.       Objective:   Physical Exam  Constitutional: She appears  well-developed and well-nourished. No distress.  Eyes: No scleral icterus.  Cardiovascular: Normal rate, regular rhythm and normal heart sounds.   Musculoskeletal:  Knee without warmth, no signficant swelling  Skin: No rash noted.   Social History   Social History  . Marital status: Single    Spouse name: N/A  . Number of children: N/A  . Years of education: N/A   Occupational History  . Not on file.   Social History Main Topics  . Smoking status: Current Every Day Smoker    Packs/day: 0.50    Types: Cigarettes    Start date: 07/23/1972  . Smokeless tobacco: Never Used  . Alcohol use No  . Drug use: No  . Sexual activity: Not Currently   Other Topics Concern  . Not on file   Social History Narrative  . No narrative on file         Assessment & Plan:

## 2016-10-04 DIAGNOSIS — E1142 Type 2 diabetes mellitus with diabetic polyneuropathy: Secondary | ICD-10-CM

## 2016-10-04 DIAGNOSIS — J449 Chronic obstructive pulmonary disease, unspecified: Secondary | ICD-10-CM | POA: Diagnosis not present

## 2016-10-04 DIAGNOSIS — I82621 Acute embolism and thrombosis of deep veins of right upper extremity: Secondary | ICD-10-CM | POA: Diagnosis not present

## 2016-10-04 DIAGNOSIS — N10 Acute tubulo-interstitial nephritis: Secondary | ICD-10-CM | POA: Diagnosis not present

## 2016-10-04 DIAGNOSIS — N390 Urinary tract infection, site not specified: Secondary | ICD-10-CM

## 2016-10-04 DIAGNOSIS — K754 Autoimmune hepatitis: Secondary | ICD-10-CM | POA: Diagnosis not present

## 2016-10-04 DIAGNOSIS — Z720 Tobacco use: Secondary | ICD-10-CM

## 2016-10-05 DIAGNOSIS — N390 Urinary tract infection, site not specified: Secondary | ICD-10-CM | POA: Diagnosis not present

## 2016-10-05 DIAGNOSIS — N10 Acute tubulo-interstitial nephritis: Secondary | ICD-10-CM | POA: Diagnosis not present

## 2016-10-05 DIAGNOSIS — E1142 Type 2 diabetes mellitus with diabetic polyneuropathy: Secondary | ICD-10-CM | POA: Diagnosis not present

## 2016-10-05 DIAGNOSIS — Z720 Tobacco use: Secondary | ICD-10-CM | POA: Diagnosis not present

## 2016-10-06 DIAGNOSIS — N10 Acute tubulo-interstitial nephritis: Secondary | ICD-10-CM | POA: Diagnosis not present

## 2016-10-06 DIAGNOSIS — K754 Autoimmune hepatitis: Secondary | ICD-10-CM | POA: Diagnosis not present

## 2016-10-06 DIAGNOSIS — Z720 Tobacco use: Secondary | ICD-10-CM | POA: Diagnosis not present

## 2016-10-06 DIAGNOSIS — E1142 Type 2 diabetes mellitus with diabetic polyneuropathy: Secondary | ICD-10-CM | POA: Diagnosis not present

## 2016-10-06 DIAGNOSIS — N390 Urinary tract infection, site not specified: Secondary | ICD-10-CM | POA: Diagnosis not present

## 2016-10-07 DIAGNOSIS — Z720 Tobacco use: Secondary | ICD-10-CM | POA: Diagnosis not present

## 2016-10-07 DIAGNOSIS — N10 Acute tubulo-interstitial nephritis: Secondary | ICD-10-CM | POA: Diagnosis not present

## 2016-10-07 DIAGNOSIS — E1142 Type 2 diabetes mellitus with diabetic polyneuropathy: Secondary | ICD-10-CM | POA: Diagnosis not present

## 2016-10-07 DIAGNOSIS — N390 Urinary tract infection, site not specified: Secondary | ICD-10-CM | POA: Diagnosis not present

## 2016-10-08 DIAGNOSIS — N10 Acute tubulo-interstitial nephritis: Secondary | ICD-10-CM | POA: Diagnosis not present

## 2016-10-08 DIAGNOSIS — N390 Urinary tract infection, site not specified: Secondary | ICD-10-CM | POA: Diagnosis not present

## 2016-10-08 DIAGNOSIS — Z720 Tobacco use: Secondary | ICD-10-CM | POA: Diagnosis not present

## 2016-10-08 DIAGNOSIS — E1142 Type 2 diabetes mellitus with diabetic polyneuropathy: Secondary | ICD-10-CM | POA: Diagnosis not present

## 2017-03-04 ENCOUNTER — Telehealth: Payer: Self-pay | Admitting: *Deleted

## 2017-03-04 NOTE — Telephone Encounter (Signed)
Call from [facility] requesting all office notes from 12/2015 until present. Stated that they referred patient to this office. Notes faxed to [phone]; confirmation received. Wendall MolaJacqueline Cockerham

## 2017-04-01 DIAGNOSIS — J449 Chronic obstructive pulmonary disease, unspecified: Secondary | ICD-10-CM

## 2017-04-01 DIAGNOSIS — E1142 Type 2 diabetes mellitus with diabetic polyneuropathy: Secondary | ICD-10-CM

## 2017-04-01 DIAGNOSIS — I82621 Acute embolism and thrombosis of deep veins of right upper extremity: Secondary | ICD-10-CM

## 2017-04-01 DIAGNOSIS — K754 Autoimmune hepatitis: Secondary | ICD-10-CM | POA: Diagnosis not present

## 2017-04-01 DIAGNOSIS — G4733 Obstructive sleep apnea (adult) (pediatric): Secondary | ICD-10-CM

## 2017-04-01 DIAGNOSIS — R079 Chest pain, unspecified: Secondary | ICD-10-CM | POA: Diagnosis not present

## 2017-04-02 DIAGNOSIS — E1142 Type 2 diabetes mellitus with diabetic polyneuropathy: Secondary | ICD-10-CM | POA: Diagnosis not present

## 2017-04-02 DIAGNOSIS — I82621 Acute embolism and thrombosis of deep veins of right upper extremity: Secondary | ICD-10-CM | POA: Diagnosis not present

## 2017-04-02 DIAGNOSIS — R079 Chest pain, unspecified: Secondary | ICD-10-CM

## 2017-04-02 DIAGNOSIS — G4733 Obstructive sleep apnea (adult) (pediatric): Secondary | ICD-10-CM | POA: Diagnosis not present

## 2017-04-02 DIAGNOSIS — K754 Autoimmune hepatitis: Secondary | ICD-10-CM | POA: Diagnosis not present

## 2017-04-02 DIAGNOSIS — J449 Chronic obstructive pulmonary disease, unspecified: Secondary | ICD-10-CM | POA: Diagnosis not present

## 2017-04-03 ENCOUNTER — Encounter (HOSPITAL_COMMUNITY): Payer: Self-pay | Admitting: General Practice

## 2017-04-03 ENCOUNTER — Observation Stay (HOSPITAL_COMMUNITY)
Admission: RE | Admit: 2017-04-03 | Discharge: 2017-04-05 | Disposition: A | Discharge status: Home / Self Care | Payer: Medicaid Other | Source: Other Acute Inpatient Hospital | Attending: Cardiology | Admitting: Cardiology

## 2017-04-03 DIAGNOSIS — Z833 Family history of diabetes mellitus: Secondary | ICD-10-CM | POA: Insufficient documentation

## 2017-04-03 DIAGNOSIS — Z880 Allergy status to penicillin: Secondary | ICD-10-CM | POA: Insufficient documentation

## 2017-04-03 DIAGNOSIS — Z7901 Long term (current) use of anticoagulants: Secondary | ICD-10-CM | POA: Diagnosis not present

## 2017-04-03 DIAGNOSIS — Z823 Family history of stroke: Secondary | ICD-10-CM | POA: Insufficient documentation

## 2017-04-03 DIAGNOSIS — I25119 Atherosclerotic heart disease of native coronary artery with unspecified angina pectoris: Principal | ICD-10-CM | POA: Insufficient documentation

## 2017-04-03 DIAGNOSIS — I10 Essential (primary) hypertension: Secondary | ICD-10-CM | POA: Diagnosis present

## 2017-04-03 DIAGNOSIS — Z96653 Presence of artificial knee joint, bilateral: Secondary | ICD-10-CM | POA: Insufficient documentation

## 2017-04-03 DIAGNOSIS — K219 Gastro-esophageal reflux disease without esophagitis: Secondary | ICD-10-CM | POA: Insufficient documentation

## 2017-04-03 DIAGNOSIS — Z9989 Dependence on other enabling machines and devices: Secondary | ICD-10-CM | POA: Diagnosis not present

## 2017-04-03 DIAGNOSIS — E7800 Pure hypercholesterolemia, unspecified: Secondary | ICD-10-CM | POA: Diagnosis present

## 2017-04-03 DIAGNOSIS — Z79899 Other long term (current) drug therapy: Secondary | ICD-10-CM | POA: Diagnosis not present

## 2017-04-03 DIAGNOSIS — F17210 Nicotine dependence, cigarettes, uncomplicated: Secondary | ICD-10-CM | POA: Diagnosis not present

## 2017-04-03 DIAGNOSIS — I208 Other forms of angina pectoris: Secondary | ICD-10-CM | POA: Diagnosis present

## 2017-04-03 DIAGNOSIS — Z800 Family history of malignant neoplasm of digestive organs: Secondary | ICD-10-CM | POA: Insufficient documentation

## 2017-04-03 DIAGNOSIS — E119 Type 2 diabetes mellitus without complications: Secondary | ICD-10-CM

## 2017-04-03 DIAGNOSIS — J4520 Mild intermittent asthma, uncomplicated: Secondary | ICD-10-CM

## 2017-04-03 DIAGNOSIS — R9439 Abnormal result of other cardiovascular function study: Secondary | ICD-10-CM

## 2017-04-03 DIAGNOSIS — Z981 Arthrodesis status: Secondary | ICD-10-CM | POA: Insufficient documentation

## 2017-04-03 DIAGNOSIS — Z8249 Family history of ischemic heart disease and other diseases of the circulatory system: Secondary | ICD-10-CM | POA: Diagnosis not present

## 2017-04-03 DIAGNOSIS — E785 Hyperlipidemia, unspecified: Secondary | ICD-10-CM | POA: Insufficient documentation

## 2017-04-03 DIAGNOSIS — G4733 Obstructive sleep apnea (adult) (pediatric): Secondary | ICD-10-CM

## 2017-04-03 DIAGNOSIS — R079 Chest pain, unspecified: Secondary | ICD-10-CM | POA: Diagnosis not present

## 2017-04-03 DIAGNOSIS — Z886 Allergy status to analgesic agent status: Secondary | ICD-10-CM | POA: Diagnosis not present

## 2017-04-03 DIAGNOSIS — K754 Autoimmune hepatitis: Secondary | ICD-10-CM | POA: Diagnosis present

## 2017-04-03 DIAGNOSIS — Z8679 Personal history of other diseases of the circulatory system: Secondary | ICD-10-CM

## 2017-04-03 DIAGNOSIS — Z86718 Personal history of other venous thrombosis and embolism: Secondary | ICD-10-CM | POA: Insufficient documentation

## 2017-04-03 DIAGNOSIS — I82621 Acute embolism and thrombosis of deep veins of right upper extremity: Secondary | ICD-10-CM | POA: Diagnosis not present

## 2017-04-03 DIAGNOSIS — Z9049 Acquired absence of other specified parts of digestive tract: Secondary | ICD-10-CM | POA: Insufficient documentation

## 2017-04-03 DIAGNOSIS — J45909 Unspecified asthma, uncomplicated: Secondary | ICD-10-CM | POA: Diagnosis present

## 2017-04-03 DIAGNOSIS — I2089 Other forms of angina pectoris: Secondary | ICD-10-CM | POA: Diagnosis present

## 2017-04-03 DIAGNOSIS — Z90710 Acquired absence of both cervix and uterus: Secondary | ICD-10-CM | POA: Diagnosis not present

## 2017-04-03 DIAGNOSIS — E1142 Type 2 diabetes mellitus with diabetic polyneuropathy: Secondary | ICD-10-CM | POA: Diagnosis not present

## 2017-04-03 HISTORY — DX: Gastro-esophageal reflux disease without esophagitis: K21.9

## 2017-04-03 HISTORY — DX: Dependence on other enabling machines and devices: Z99.89

## 2017-04-03 HISTORY — DX: Autoimmune hepatitis: K75.4

## 2017-04-03 HISTORY — DX: Unspecified osteoarthritis, unspecified site: M19.90

## 2017-04-03 HISTORY — DX: Family history of other specified conditions: Z84.89

## 2017-04-03 HISTORY — DX: Obstructive sleep apnea (adult) (pediatric): G47.33

## 2017-04-03 HISTORY — DX: Pure hypercholesterolemia, unspecified: E78.00

## 2017-04-03 HISTORY — DX: Personal history of other medical treatment: Z92.89

## 2017-04-03 HISTORY — DX: Unspecified asthma, uncomplicated: J45.909

## 2017-04-03 HISTORY — DX: Type 2 diabetes mellitus without complications: E11.9

## 2017-04-03 LAB — GLUCOSE, CAPILLARY: Glucose-Capillary: 103 mg/dL — ABNORMAL HIGH (ref 65–99)

## 2017-04-03 LAB — TROPONIN I: Troponin I: <0.03 ng/mL

## 2017-04-03 MED ORDER — INSULIN ASPART 100 UNIT/ML ~~LOC~~ SOLN: 0.0000 [IU] | Freq: Three times a day (TID) | SUBCUTANEOUS | Status: DC

## 2017-04-03 MED ORDER — OXYCODONE HCL 5 MG PO TABS
10.0000 mg | ORAL_TABLET | Freq: Four times a day (QID) | ORAL | Status: DC | PRN
  Administered 2017-04-03 – 2017-04-05 (×3): 10 mg via ORAL
  Filled 2017-04-03 (×3): qty 2

## 2017-04-03 MED ORDER — ONDANSETRON HCL 4 MG PO TABS: 4.0000 mg | ORAL_TABLET | Freq: Four times a day (QID) | ORAL | Status: DC | PRN

## 2017-04-03 MED ORDER — FENOFIBRATE 160 MG PO TABS
160.0000 mg | ORAL_TABLET | Freq: Every day | ORAL | Status: DC
  Administered 2017-04-04 – 2017-04-05 (×2): 160 mg via ORAL
  Filled 2017-04-03 (×2): qty 1

## 2017-04-03 MED ORDER — SODIUM CHLORIDE 0.9% FLUSH
3.0000 mL | Freq: Two times a day (BID) | INTRAVENOUS | Status: DC
  Administered 2017-04-04: 3 mL via INTRAVENOUS

## 2017-04-03 MED ORDER — AZATHIOPRINE 50 MG PO TABS
100.0000 mg | ORAL_TABLET | Freq: Every day | ORAL | Status: DC
  Administered 2017-04-04 – 2017-04-05 (×2): 100 mg via ORAL
  Filled 2017-04-03 (×2): qty 2

## 2017-04-03 MED ORDER — SODIUM CHLORIDE 0.9 % IV SOLN: 250.0000 mL | INTRAVENOUS | Status: DC | PRN

## 2017-04-03 MED ORDER — ALBUTEROL SULFATE (2.5 MG/3ML) 0.083% IN NEBU: 2.5000 mg | INHALATION_SOLUTION | RESPIRATORY_TRACT | Status: DC | PRN

## 2017-04-03 MED ORDER — ONDANSETRON HCL 4 MG/2ML IJ SOLN: 4.0000 mg | Freq: Four times a day (QID) | INTRAMUSCULAR | Status: DC | PRN

## 2017-04-03 MED ORDER — ASPIRIN EC 81 MG PO TBEC
81.0000 mg | DELAYED_RELEASE_TABLET | Freq: Every day | ORAL | Status: DC
Start: 2017-04-03 — End: 2017-04-05
  Administered 2017-04-03 – 2017-04-05 (×3): 81 mg via ORAL
  Filled 2017-04-03 (×3): qty 1

## 2017-04-03 MED ORDER — BUDESONIDE 0.5 MG/2ML IN SUSP
0.5000 mg | Freq: Two times a day (BID) | RESPIRATORY_TRACT | Status: DC
  Administered 2017-04-03 – 2017-04-04 (×3): 0.5 mg via RESPIRATORY_TRACT
  Filled 2017-04-03 (×4): qty 2

## 2017-04-03 MED ORDER — SODIUM CHLORIDE 0.9% FLUSH: 3.0000 mL | INTRAVENOUS | Status: DC | PRN

## 2017-04-03 MED ORDER — IPRATROPIUM-ALBUTEROL 0.5-2.5 (3) MG/3ML IN SOLN: 3.0000 mL | Freq: Four times a day (QID) | RESPIRATORY_TRACT | Status: DC | PRN

## 2017-04-03 MED ORDER — INSULIN ASPART 100 UNIT/ML ~~LOC~~ SOLN: 0.0000 [IU] | Freq: Every day | SUBCUTANEOUS | Status: DC

## 2017-04-03 NOTE — H&P (Signed)
Nancy Simmons YQM:[id number] DOB: 11/09/1955 DOA: 04/03/2017     PCP: Wilmer Floor., MD   Outpatient Specialists: ID comer, [facility] Patient coming from:    home Lives alone,      Chief Complaint: Chest pain, transferred from [place]  HPI: Nancy Simmons is a 61 y.o. 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 Improved with NItro. Initially pain was 10 out of 10 but after EMS arrived and administered nitroglycerin was down to 3 out of 10 Radiated to her back and abdomen. Associated with Nausea and diaphoresis. Pain occurred at rest.  She has been sedentary lately due to bad HIP and have not been walking actively. If she walks up stairs she gets shortness of breth.But ususlay no chest pain at rest.  Currently chest pain free. No melena, no fever or chills, Mild cough starting today.    A trend of patient have had 2-D echo EF 67% diastolic dysfunction with severely dilated left ventricle concentric left ventricular hypertrophy left atrium moderately dilated mild aortic valve sclerosis.  2-D stress test perfusion showing areas of reversible ischemia of anterior septal and inferior lateral wall low to intermediate stress test cardiology was consulted to trend of who recommended transfer to [facility] case was discussed with cardiology here Dr. Marcelino Freestone. Plan to hold [place] plan for cardiac catheterization on September 14 Lipid profile done at [facility] showing total cholesterol 107 LDL 16 HDL 30   Regarding pertinent Chronic problems:DM on metformin reports remote Cardiac craterization years ago unsure of result. She has a cardiologist on [place] but cannot recollect his name.  History of peak induced DVT in right arm over 1 year ago currently on eliquis History of autoimmune hepatitis on azathioprine History of  sleep apnea on C Pap IN ER:  Temp (24hrs), Avg:99.7 F (37.6 C), Min:99.7 F (37.6 C), Max:99.7 F (37.6 C)      on arrival  ED Triage Vitals [04/03/17 1800]  Enc Vitals Group     BP (!) 150/76     Pulse Rate 69     Resp (!) 21     Temp 99.7 F (37.6 C)     Temp Source Oral     SpO2 100 %     Weight 262 lb 2 oz (118.9 kg)     Height  (1.575 m)     Head Circumference      Peak Flow      Pain Score      Pain Loc      Pain Edu?      Excl. in GC?     Latest 99.7 RR 21 100% Hr 69 BP 150/76 WBC 5.0 hemoglobin 10.6 NA 149, K3.8, creatinine 0.6 INR 1.1 calcium 9.6  LFTs unremarkable  troponin less than 0.01 BNP 127 Chest x-ray nonacute Following Medications were ordered in ER: Medications - No data to display   [facility] provider discussed case with: Dr. Dr. Sanjuana Kava Who recommends: transfer to [facility] for cardiac cath on 9/14 We'll see patient in consult in the morning    Hospitalist was called for admission for Stable angina  Review of Systems:    Pertinent positives include: chest pain,   Constitutional:  No weight loss, night sweats, Fevers, chills, fatigue, weight loss  HEENT:  No headaches, Difficulty swallowing,Tooth/dental problems,Sore  throat,  No sneezing, itching, ear ache, nasal congestion, post nasal drip,  Cardio-vascular:  No Orthopnea, PND, anasarca, dizziness, palpitations.no Bilateral lower extremity swelling  GI:  No heartburn, indigestion, abdominal pain, nausea, vomiting, diarrhea, change in bowel habits, loss of appetite, melena, blood in stool, hematemesis Resp:  no shortness of breath at rest. No dyspnea on exertion, No excess mucus, no productive cough, No non-productive cough, No coughing up of blood.No change in color of mucus.No wheezing. Skin:  no rash or lesions. No jaundice GU:  no dysuria, change in color of urine, no urgency or frequency. No straining to urinate.  No flank pain.  Musculoskeletal:  No joint pain or no joint  swelling. No decreased range of motion. No back pain.  Psych:  No change in mood or affect. No depression or anxiety. No memory loss.  Neuro: no localizing neurological complaints, no tingling, no weakness, no double vision, no gait abnormality, no slurred speech, no confusion  As per HPI otherwise 10 point review of systems negative.   Past Medical History: Past Medical History:  Diagnosis Date  . Arthritis    "pretty much all over" (04/03/2017)  . Asthma   . Autoimmune hepatitis (HCC)   . Family history of adverse reaction to anesthesia    "granddaughter has PONV"  . GERD (gastroesophageal reflux disease)   . High cholesterol   . History of blood transfusion    "in [place]; related to knee surgeries"  . OSA on CPAP   . Type II diabetes mellitus (HCC)    Past Surgical History:  Procedure Laterality Date  . ABDOMINAL HYSTERECTOMY    . BACK SURGERY    . CARDIAC CATHETERIZATION    . CARPAL TUNNEL RELEASE Bilateral    "done in [place]"  . CHOLECYSTECTOMY    . INCISION AND DRAINAGE Right 2015   S/P knee revision  . JOINT REPLACEMENT    . POSTERIOR FUSION LUMBAR SPINE  05/2010  . REVISION TOTAL KNEE ARTHROPLASTY Right 2015  . SHOULDER ARTHROSCOPY WITH ROTATOR CUFF REPAIR Right   . SHOULDER OPEN ROTATOR CUFF REPAIR Left    "done in [place]"  . TOTAL KNEE ARTHROPLASTY Bilateral 1990s     Social History:  Ambulatory walker      reports that she has been smoking Cigarettes.  She started smoking about 44 years ago. She has a 22.00 pack-year smoking history. She has never used smokeless tobacco. She reports that she does not drink alcohol or use drugs.  Allergies:   Allergies  Allergen Reactions  . Penicillin G Rash       Family History:   Family History  Problem Relation Age of Onset  . CAD Mother   . CAD Father   . Diabetes Father   . CAD Brother   . Diabetes Brother   . Colon cancer Other   . Stroke Other     Medications: Prior to Admission  medications   Medication Sig Start Date End Date Taking? Authorizing Provider  apixaban (ELIQUIS) 5 MG TABS tablet Take 5 mg by mouth 2 (two) times daily.    [provider]  azathioprine (IMURAN) 100 MG tablet Take 100 mg by mouth daily.    [provider]  beclomethasone (QVAR) 80 MCG/ACT inhaler Inhale 2 puffs into the lungs 2 (two) times daily. Reported on 01/16/2016    [provider]  Choline Fenofibrate (TRILIPIX) 135 MG capsule Take 135 mg by mouth daily.    [provider]  fesoterodine Gala Murdoch)  8 MG TB24 tablet Take 8 mg by mouth daily.    [provider]  hydrOXYzine (ATARAX/VISTARIL) 25 MG tablet Take 25 mg by mouth 3 (three) times daily as needed (for hives).    [provider]  Ipratropium-Albuterol (COMBIVENT RESPIMAT) 20-100 MCG/ACT AERS respimat Inhale 1 puff into the lungs every 6 (six) hours.    [provider]  ipratropium-albuterol (DUONEB) 0.5-2.5 (3) MG/3ML SOLN Take 3 mLs by nebulization every 6 (six) hours as needed (for shortrness of breath). Reported on 01/16/2016    [provider]  meclizine (ANTIVERT) 25 MG tablet Take 25 mg by mouth 3 (three) times daily as needed for dizziness.    [provider]  metFORMIN (GLUCOPHAGE) 500 MG tablet Take 500 mg by mouth 2 (two) times daily with a meal.    [provider]  metroNIDAZOLE (FLAGYL) 500 MG tablet Take 1 tablet (500 mg total) by mouth 3 (three) times daily. Patient not taking: Reported on 08/27/2016 07/09/16   Gardiner Barefoot, MD  Multiple Vitamins-Minerals (DAILY MULTIVITAMIN PO) Take by mouth 1 day or 1 dose.    [provider]  OxyCODONE HCl, Abuse Deter, (OXAYDO) 5 MG TABA Take 10 mg by mouth. Reported on 01/16/2016    [provider]  pantoprazole (PROTONIX) 40 MG tablet Take 40 mg by mouth daily.    [provider]  sulfamethoxazole-trimethoprim (BACTRIM DS,SEPTRA DS) 800-160 MG tablet Take 2 tablets by  mouth 2 (two) times daily. Patient not taking: Reported on 08/27/2016 07/09/16   Gardiner Barefoot, MD  traZODone (DESYREL) 100 MG tablet Take 100 mg by mouth at bedtime.    [provider]    Physical Exam: Patient Vitals for the past 24 hrs:  BP Temp Temp src Pulse Resp SpO2 Height Weight  04/03/17 1800 (!) 150/76 99.7 F (37.6 C) Oral 69 (!) 21 100 %  (1.575 m) 118.9 kg (262 lb 2 oz)    1. General:  in No Acute distress  well  -appearing 2. Psychological: Alert and   Oriented 3. Head/ENT:   Moist   Mucous Membranes                          Head Non traumatic, neck supple                          Normal    Dentition 4. SKIN: normal  Skin turgor,  Skin clean Dry and intact no rash 5. Heart: Regular rate and rhythm no Murmur, no Rub or gallop 6. Lungs:  Clear to auscultation bilaterally, no wheezes or crackles   7. Abdomen: Soft, non-tender, Non distended  Obese bowel sounds present 8. Lower extremities: no clubbing, cyanosis, or edema 9. Neurologically Grossly intact, moving all 4 extremities equally  10. MSK: Normal range of motion   body mass index is 47.94 kg/m.  Labs on Admission:   Labs on Admission: I have personally reviewed following labs and imaging studies  CBC: No results for input(s): WBC, NEUTROABS, HGB, HCT, MCV, PLT in the last 168 hours. Basic Metabolic Panel: No results for input(s): NA, K, CL, CO2, GLUCOSE, BUN, CREATININE, CALCIUM, MG, PHOS in the last 168 hours. GFR: CrCl cannot be calculated (Patient's most recent lab result is older than the maximum 21 days allowed.). Liver Function Tests: No results for input(s): AST, ALT, ALKPHOS, BILITOT, PROT, ALBUMIN in the last 168 hours. No  results for input(s): LIPASE, AMYLASE in the last 168 hours. No results for input(s): AMMONIA in the last 168 hours. Coagulation Profile: No results for input(s): INR, PROTIME in the last 168 hours. Cardiac Enzymes: No results for input(s): CKTOTAL, CKMB,  CKMBINDEX, TROPONINI in the last 168 hours. BNP (last 3 results) No results for input(s): PROBNP in the last 8760 hours. HbA1C: No results for input(s): HGBA1C in the last 72 hours. CBG: No results for input(s): GLUCAP in the last 168 hours. Lipid Profile: No results for input(s): CHOL, HDL, LDLCALC, TRIG, CHOLHDL, LDLDIRECT in the last 72 hours. Thyroid Function Tests: No results for input(s): TSH, T4TOTAL, FREET4, T3FREE, THYROIDAB in the last 72 hours. Anemia Panel: No results for input(s): VITAMINB12, FOLATE, FERRITIN, TIBC, IRON, RETICCTPCT in the last 72 hours. Urine analysis: No results found for: COLORURINE, APPEARANCEUR, LABSPEC, PHURINE, GLUCOSEU, HGBUR, BILIRUBINUR, KETONESUR, PROTEINUR, UROBILINOGEN, NITRITE, LEUKOCYTESUR Sepsis Labs: @LABRCNTIP (procalcitonin:4,lacticidven:4) )No results found for this or any previous visit (from the past 240 hour(s)).      UA  not ordered  No results found for: HGBA1C  CrCl cannot be calculated (Patient's most recent lab result is older than the maximum 21 days allowed.).  BNP (last 3 results) No results for input(s): PROBNP in the last 8760 hours.   ECG REPORT  Independently reviewed Rate: 61  Rhythm: NSR ST&T Change: No acute ischemic changes  QTC 411  Filed Weights   04/03/17 1800  Weight: 118.9 kg (262 lb 2 oz)     Cultures: No results found for: SDES, SPECREQUEST, CULT, REPTSTATUS   Radiological Exams on Admission: No results found.  Chart has been reviewed    Assessment/Plan   61 y.o. 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 Stable angina (HCC)/ Angina at rest [facility](HCC) - plan for cardiac catheterization on 14 continue to monitor on telemetry currently chest pain-free Trend cardiac enzymes  . Autoimmune hepatitis (HCC) - continue home medications   Azathiaoprine currently stable . HTN (hypertension) - stable continue home medications . Hypercholesterolemia - [facility] appears to be at goal continue core medications . Asthma/copd - stable continue home inhalers and when necessary nebulizer currently appears to be well compensated Tobacco abuse spoke about importance of quitting patient is interested at order nicotine cessation protocol Diabetes mellitus type 2 -  - Order Sensitive SSI     -  check TSH and HgA1C  - Hold by mouth medications  OSA - continue CPAP    Other plan as per orders.  DVT prophylaxis:  SCD   Code Status:  FULL CODE   as per patient    Family Communication:   Family not at  Bedside    Disposition Plan:      To home once workup is complete and patient is stable                             Consults called: email cardiology   Admission status:    inpatient     Level of care    tele          I have spent a total of 56 min on this admission  Jesse Nosbisch 04/03/2017, 9:24 PM    [facility]  Pager [phone]   after 2 AM please page  floor coverage PA If 7AM-7PM, please contact the day team taking care of the patient  [email or web address]  Password [email or web address]

## 2017-04-03 NOTE — Progress Notes (Signed)
Patient has CPAP from home. RT will continue to monitor.

## 2017-04-03 NOTE — Progress Notes (Signed)
Dr. Regino Schultzeutouva  Text page for new pt admission by Gardiner CoinsF Tarpley RN .

## 2017-04-04 DIAGNOSIS — E7800 Pure hypercholesterolemia, unspecified: Secondary | ICD-10-CM

## 2017-04-04 DIAGNOSIS — R9439 Abnormal result of other cardiovascular function study: Secondary | ICD-10-CM | POA: Diagnosis not present

## 2017-04-04 DIAGNOSIS — E119 Type 2 diabetes mellitus without complications: Secondary | ICD-10-CM

## 2017-04-04 DIAGNOSIS — K754 Autoimmune hepatitis: Secondary | ICD-10-CM

## 2017-04-04 DIAGNOSIS — I25119 Atherosclerotic heart disease of native coronary artery with unspecified angina pectoris: Secondary | ICD-10-CM | POA: Diagnosis not present

## 2017-04-04 DIAGNOSIS — I208 Other forms of angina pectoris: Secondary | ICD-10-CM | POA: Diagnosis not present

## 2017-04-04 DIAGNOSIS — I10 Essential (primary) hypertension: Secondary | ICD-10-CM | POA: Diagnosis not present

## 2017-04-04 LAB — CBC
HCT: 31.1 % — ABNORMAL LOW (ref 36.0–46.0)
Hemoglobin: 9.8 g/dL — ABNORMAL LOW (ref 12.0–15.0)
MCH: 29.8 pg (ref 26.0–34.0)
MCHC: 31.5 g/dL (ref 30.0–36.0)
MCV: 94.5 fL (ref 78.0–100.0)
PLATELETS: 280 10*3/uL (ref 150–400)
RBC: 3.29 MIL/uL — AB (ref 3.87–5.11)
RDW: 15.6 % — AB (ref 11.5–15.5)
WBC: 5.1 10*3/uL (ref 4.0–10.5)

## 2017-04-04 LAB — HEMOGLOBIN A1C
HEMOGLOBIN A1C: 5.3 % (ref 4.8–5.6)
Mean Plasma Glucose: 105.41 mg/dL

## 2017-04-04 LAB — COMPREHENSIVE METABOLIC PANEL
ALK PHOS: 94 U/L (ref 38–126)
ALT: 20 U/L (ref 14–54)
AST: 21 U/L (ref 15–41)
Albumin: 3.2 g/dL — ABNORMAL LOW (ref 3.5–5.0)
Anion gap: 6 (ref 5–15)
BUN: 12 mg/dL (ref 6–20)
CALCIUM: 9.2 mg/dL (ref 8.9–10.3)
CO2: 23 mmol/L (ref 22–32)
CREATININE: 0.6 mg/dL (ref 0.44–1.00)
Chloride: 108 mmol/L (ref 101–111)
GFR calc non Af Amer: >60 mL/min (ref >60)
Glucose, Bld: 110 mg/dL — ABNORMAL HIGH (ref 65–99)
Potassium: 4 mmol/L (ref 3.5–5.1)
SODIUM: 137 mmol/L (ref 135–145)
Total Bilirubin: 0.6 mg/dL (ref 0.3–1.2)
Total Protein: 6.8 g/dL (ref 6.5–8.1)

## 2017-04-04 LAB — GLUCOSE, CAPILLARY
GLUCOSE-CAPILLARY: 100 mg/dL — AB (ref 65–99)
GLUCOSE-CAPILLARY: 105 mg/dL — AB (ref 65–99)
GLUCOSE-CAPILLARY: 114 mg/dL — AB (ref 65–99)
Glucose-Capillary: 138 mg/dL — ABNORMAL HIGH (ref 65–99)
Glucose-Capillary: 93 mg/dL (ref 65–99)

## 2017-04-04 LAB — TSH: TSH: 1.82 u[IU]/mL (ref 0.350–4.500)

## 2017-04-04 LAB — MAGNESIUM: Magnesium: 1.5 mg/dL — ABNORMAL LOW (ref 1.7–2.4)

## 2017-04-04 LAB — TROPONIN I
Troponin I: <0.03 ng/mL (ref <0.03)
Troponin I: <0.03 ng/mL (ref <0.03)

## 2017-04-04 LAB — HIV ANTIBODY (ROUTINE TESTING W REFLEX): HIV SCREEN 4TH GENERATION: NONREACTIVE

## 2017-04-04 LAB — PHOSPHORUS: Phosphorus: 3.7 mg/dL (ref 2.5–4.6)

## 2017-04-04 MED ORDER — MAGNESIUM SULFATE 2 GM/50ML IV SOLN
2.0000 g | Freq: Once | INTRAVENOUS | Status: AC
  Administered 2017-04-04: 2 g via INTRAVENOUS
  Filled 2017-04-04: qty 50

## 2017-04-04 MED ORDER — MECLIZINE HCL 25 MG PO TABS
25.0000 mg | ORAL_TABLET | Freq: Three times a day (TID) | ORAL | Status: DC | PRN
  Administered 2017-04-04: 25 mg via ORAL
  Filled 2017-04-04 (×2): qty 1

## 2017-04-04 MED ORDER — SODIUM CHLORIDE 0.9 % IV SOLN
250.0000 mL | INTRAVENOUS | Status: DC | PRN
Start: 2017-04-04 — End: 2017-04-05

## 2017-04-04 MED ORDER — ASPIRIN 81 MG PO CHEW
81.0000 mg | CHEWABLE_TABLET | ORAL | Status: AC
  Administered 2017-04-05: 81 mg via ORAL
  Filled 2017-04-04: qty 1

## 2017-04-04 MED ORDER — SODIUM CHLORIDE 0.9 % WEIGHT BASED INFUSION
3.0000 mL/kg/h | INTRAVENOUS | Status: DC
  Administered 2017-04-05: 3 mL/kg/h via INTRAVENOUS

## 2017-04-04 MED ORDER — SODIUM CHLORIDE 0.9% FLUSH: 3.0000 mL | INTRAVENOUS | Status: DC | PRN

## 2017-04-04 MED ORDER — SODIUM CHLORIDE 0.9% FLUSH
3.0000 mL | Freq: Two times a day (BID) | INTRAVENOUS | Status: DC
  Administered 2017-04-04: 3 mL via INTRAVENOUS

## 2017-04-04 MED ORDER — ENOXAPARIN SODIUM 40 MG/0.4ML ~~LOC~~ SOLN: 40.0000 mg | SUBCUTANEOUS | Status: DC

## 2017-04-04 MED ORDER — SODIUM CHLORIDE 0.9 % WEIGHT BASED INFUSION
1.0000 mL/kg/h | INTRAVENOUS | Status: DC
  Administered 2017-04-05: 1 mL/kg/h via INTRAVENOUS

## 2017-04-04 NOTE — Consult Note (Signed)
 Cardiology Consultation:   Patient ID: Nancy Simmons; [id number]; 11/03/1955   Admit date: 04/03/2017 Date of Consult: 04/04/2017  Primary Care Provider: Campbell, Stephen D., MD Primary Cardiologist: New   Patient Profile:   Nancy Simmons is a 61 y.o. 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  History of Present Illness:   [name] presented to [facility] on 04/03/17 with sharp chest pain. A cardiac stress test was done which was positive for reversible ischemia of the anterior septal and inferior lateral wall. This was a low to intermediate risk stress test. The patient was transferred to [facility] for further cardiac evaluation and possible cardiac catheterization.  Significant findings: Troponins: <0.03, <0.03, <0.03 SCr 0.60,   K+  4.0,  magnesium 1.5  Hgb 9.8 TSH 1.820 Hemoglobin A1c 5.3  Past Medical History:  Diagnosis Date  . Arthritis    "pretty much all over" (04/03/2017)  . Asthma   . Autoimmune hepatitis (HCC)   . Family history of adverse reaction to anesthesia    "granddaughter has PONV"  . GERD (gastroesophageal reflux disease)   . High cholesterol   . History of blood transfusion    "in [place]; related to knee surgeries"  . OSA on CPAP   . Type II diabetes mellitus (HCC)     Past Surgical History:  Procedure Laterality Date  . ABDOMINAL HYSTERECTOMY    . BACK SURGERY    . CARDIAC CATHETERIZATION    . CARPAL TUNNEL RELEASE Bilateral    "done in [place]"  . CHOLECYSTECTOMY    . INCISION AND DRAINAGE Right 2015   S/P knee revision  . JOINT REPLACEMENT    . POSTERIOR FUSION LUMBAR SPINE  05/2010  . REVISION TOTAL KNEE ARTHROPLASTY Right 2015  . SHOULDER ARTHROSCOPY WITH ROTATOR CUFF REPAIR Right   . SHOULDER OPEN ROTATOR CUFF REPAIR Left    "done in  [place]"  . TOTAL KNEE ARTHROPLASTY Bilateral 1990s     Home Medications:  Prior to Admission medications   Medication Sig Start Date End Date Taking? Authorizing Provider  apixaban (ELIQUIS) 5 MG TABS tablet Take 5 mg by mouth 2 (two) times daily.   Yes [provider]  azathioprine (IMURAN) 100 MG tablet Take 100 mg by mouth daily.   Yes [provider]  beclomethasone (QVAR) 80 MCG/ACT inhaler Inhale 2 puffs into the lungs 2 (two) times daily. Reported on 01/16/2016   Yes [provider]  Choline Fenofibrate (TRILIPIX) 135 MG capsule Take 135 mg by mouth daily.   Yes [provider]  fesoterodine (TOVIAZ) 8 MG TB24 tablet Take 8 mg by mouth daily.   Yes [provider]  gabapentin (NEURONTIN) 100 MG capsule Take 100 mg by mouth 3 (three) times daily.   Yes [provider]  hydrOXYzine (ATARAX/VISTARIL) 25 MG tablet Take 25 mg by mouth 3 (three) times daily as needed (for hives).   Yes [provider]  Ipratropium-Albuterol (COMBIVENT RESPIMAT) 20-100 MCG/ACT AERS respimat Inhale 1 puff into the lungs every 6 (six) hours.   Yes [provider]  ipratropium-albuterol (DUONEB) 0.5-2.5 (3) MG/3ML SOLN Take 3 mLs by nebulization every 6 (six) hours as needed (for shortrness of breath). Reported on 01/16/2016   Yes [provider]  meclizine (ANTIVERT) 25 MG tablet Take   25 mg by mouth 3 (three) times daily as needed for dizziness.   Yes [provider]  meloxicam (MOBIC) 7.5 MG tablet Take 7.5 mg by mouth 2 (two) times daily.   Yes [provider]  metFORMIN (GLUCOPHAGE) 500 MG tablet Take 500 mg by mouth 2 (two) times daily with a meal.   Yes [provider]  Multiple Vitamins-Minerals (DAILY MULTIVITAMIN PO) Take 1 tablet by mouth daily.    Yes [provider]  OxyCODONE HCl, Abuse Deter, (OXAYDO) 5 MG TABA Take 10 mg by mouth 4 (four) times daily as needed (pain). Reported on  01/16/2016   Yes [provider]  pantoprazole (PROTONIX) 40 MG tablet Take 40 mg by mouth daily.   Yes [provider]  tiotropium (SPIRIVA) 18 MCG inhalation capsule Place 18 mcg into inhaler and inhale daily.   Yes [provider]    Inpatient Medications: Scheduled Meds: . aspirin EC  81 mg Oral Daily  . azaTHIOprine  100 mg Oral Daily  . budesonide  0.5 mg Nebulization BID  . fenofibrate  160 mg Oral Daily  . insulin aspart  0-5 Units Subcutaneous QHS  . insulin aspart  0-9 Units Subcutaneous TID WC  . sodium chloride flush  3 mL Intravenous Q12H   Continuous Infusions: . sodium chloride     PRN Meds: sodium chloride, ipratropium-albuterol, ondansetron **OR** ondansetron (ZOFRAN) IV, oxyCODONE, sodium chloride flush  Allergies:    Allergies  Allergen Reactions  . Tylenol [Acetaminophen]   . Penicillin G Rash    Social History:   Social History   Social History  . Marital status: Single    Spouse name: N/A  . Number of children: N/A  . Years of education: N/A   Occupational History  . Not on file.   Social History Main Topics  . Smoking status: Current Every Day Smoker    Packs/day: 0.50    Years: 44.00    Types: Cigarettes    Start date: 07/23/1972  . Smokeless tobacco: Never Used  . Alcohol use No  . Drug use: No  . Sexual activity: Not Currently   Other Topics Concern  . Not on file   Social History Narrative  . No narrative on file    Family History:    Family History  Problem Relation Age of Onset  . CAD Mother   . CAD Father   . Diabetes Father   . CAD Brother   . Diabetes Brother   . Colon cancer Other   . Stroke Other      ROS:  Please see the history of present illness.  ROS  All other ROS reviewed and negative.     Physical Exam/Data:   Vitals:   04/03/17 2333 04/04/17 0601 04/04/17 0754 04/04/17 0755  BP:  (!) 127/57    Pulse:  77 (!) 6   Resp:   16   Temp:  98.8 F (37.1 C)    TempSrc:  Oral      SpO2: 98% 100%  98%  Weight:  261 lb 9.6 oz (118.7 kg)    Height:        Intake/Output Summary (Last 24 hours) at 04/04/17 1440 Last data filed at 04/04/17 0800  Gross per 24 hour  Intake              180 ml  Output                0 ml  Net  180 ml   Filed Weights   04/03/17 1800 04/04/17 0601  Weight: 262 lb 2 oz (118.9 kg) 261 lb 9.6 oz (118.7 kg)   Body mass index is 47.85 kg/m.  General:  Well nourished, well developed, Obese female, in no acute distress HEENT: normal Lymph: no adenopathy Neck: no JVD Endocrine:  No thryomegaly Vascular: No carotid bruits; FA pulses 2+ bilaterally without bruits  Cardiac:  normal S1, S2; RRR; no murmur  Lungs:  clear to auscultation bilaterally, no wheezing, rhonchi or rales  Abd: soft, nontender, no hepatomegaly  Ext: no edema Musculoskeletal:  No deformities, BUE and BLE strength normal and equal Skin: warm and dry  Neuro:  CNs 2-12 intact, no focal abnormalities noted Psych:  Normal affect   Telemetry:  Telemetry was personally reviewed and demonstrates:  NSR in the 60's-70's. 4 beats NSVT  Relevant CV Studies:  Cardiac stress test was positive forreversible ischemia of anterior septal and inferior lateral wall low to intermediate stress test  Laboratory Data:  Chemistry Recent Labs Lab 04/04/17 0637  NA 137  K 4.0  CL 108  CO2 23  GLUCOSE 110*  BUN 12  CREATININE 0.60  CALCIUM 9.2  GFRNONAA >60  GFRAA >60  ANIONGAP 6     Recent Labs Lab 04/04/17 0637  PROT 6.8  ALBUMIN 3.2*  AST 21  ALT 20  ALKPHOS 94  BILITOT 0.6   Hematology Recent Labs Lab 04/04/17 0637  WBC 5.1  RBC 3.29*  HGB 9.8*  HCT 31.1*  MCV 94.5  MCH 29.8  MCHC 31.5  RDW 15.6*  PLT 280   Cardiac Enzymes Recent Labs Lab 04/03/17 2004 04/04/17 0115 04/04/17 0637  TROPONINI <0.03 <0.03 <0.03   No results for input(s): TROPIPOC in the last 168 hours.  BNPNo results for input(s): BNP, PROBNP in the last 168 hours.   DDimer No results for input(s): DDIMER in the last 168 hours.  Radiology/Studies:  No results found.  Assessment and Plan:   Chest pain/unstable angina -[facility] with sharp chest pain. Troponins were negative 3. The patient had an immediate risk stress test showing reversible ischemia. [facility] was [facility] for further cardiac evaluation and possible cardiac catheterization -currently chest pain free.  -Continue aspirin -SCr  0.60 -Plan for cath first case tomorrow.   Hypertension  -On no blood pressure lowering medications at home -Blood pressure is currently well controlled, was a little elevated last evening  Type 2 diabetes -Hemoglobin A1c 5.3.  -CBGs and sliding scale insulin per internal medicine. -Hold metformin with plans for cardiac cath and then for 48 hours  Hypomagnesemia -Magnesium level I.5. Supplementation given  History of PICC-induced DVT -Patient on Eliquis. Currently on hold for potential Cardiac catheterization. Last dose was yesterday am per pt.   Hyperlipidemia  -Treated with fenofibrate  -Pt does not think she has tried statin in the past. If cath shows CAD will need statin.  -Will check FLP   For questions or updates, please contact [facility] Please consult [email or web address] for contact info under Cardiology/STEMI.   Signed, Berton BonJanine Hatley Henegar, NP  04/04/2017 2:40 PM

## 2017-04-04 NOTE — Progress Notes (Addendum)
PROGRESS NOTE    Nancy Simmons  BJY:[id number] DOB: [birth year] DOA: 04/03/2017 PCP: Wilmer Floor., MD   Brief Narrative:  61 y.o. female with medical history significant of CAD, HTN, HLD, hx of PICC induced DVT on Eliquis, Tobacco abuse, DM 2, OSA on CPAP, autoimmune hepatitis, presented to [facility] with sharp  chest pain. Cardiac stress test was positive forreversible ischemia of anterior septal and inferior lateral wall low to intermediate stress test,  cardiology was consulted who recommended transfer to [facility] for cardiac cath.   Assessment & Plan:   # Chest pain/unstable angina: Positive cardiac stress test, follow-up cardiologist planned likely cardiac cath tomorrow. Patient denied chest pain or shortness of breath. Continue aspirin, fenofibrate and supportive care. -Troponin negative.  #Hypertension: Blood pressure acceptable. Continue to monitor  #History of autoimmune hepatitis: Continues with heparin.  #Type 2 diabetes: Continue sliding scale. Monitor blood sugar level. A1c 5.3  #Hypomagnesemia: Repleted magnesium sulfate. Monitor labs  #History of PICC induced DVT on Eliquis: On hold now because of possible cath tomorrow.  #  OSA on CPAP  DVT prophylaxis: SCD and ambulation. Eliquis on hold Code Status: Full code Family Communication: No family at bedside Disposition Plan: Currently admitted    Consultants:   Cardiologist  Procedures: None Antimicrobials: None  Subjective: Seen and examined at bedside. Denied headache, dizziness, nausea vomiting chest pain or shortness of breath.  Objective: Vitals:   04/03/17 2333 04/04/17 0601 04/04/17 0754 04/04/17 0755  BP:  (!) 127/57    Pulse:  77 (!) 6   Resp:   16   Temp:  98.8 F (37.1 C)    TempSrc:  Oral    SpO2: 98% 100%  98%  Weight:  118.7 kg (261 lb 9.6 oz)    Height:        Intake/Output Summary (Last 24 hours) at 04/04/17 1041 Last data filed at 04/04/17 0800  Gross per 24 hour    Intake              180 ml  Output                0 ml  Net              180 ml   Filed Weights   04/03/17 1800 04/04/17 0601  Weight: 118.9 kg (262 lb 2 oz) 118.7 kg (261 lb 9.6 oz)    Examination:  General exam: Appears calm and comfortable  Respiratory system: Clear to auscultation. Respiratory effort normal. No wheezing or crackle Cardiovascular system: S1 & S2 heard, RRR.  No pedal edema. Gastrointestinal system: Abdomen is nondistended, soft and nontender. Normal bowel sounds heard. Central nervous system: Alert and oriented. No focal neurological deficits. Extremities: Symmetric 5 x 5 power. Skin: No rashes, lesions or ulcers Psychiatry: Judgement and insight appear normal. Mood & affect appropriate.     Data Reviewed: I have personally reviewed following labs and imaging studies  CBC:  Recent Labs Lab 04/04/17 0637  WBC 5.1  HGB 9.8*  HCT 31.1*  MCV 94.5  PLT 280   Basic Metabolic Panel:  Recent Labs Lab 04/04/17 0637  NA 137  K 4.0  CL 108  CO2 23  GLUCOSE 110*  BUN 12  CREATININE 0.60  CALCIUM 9.2  MG 1.5*  PHOS 3.7   GFR: Estimated Creatinine Clearance: 89.2 mL/min (by C-G formula based on SCr of 0.6 mg/dL). Liver Function Tests:  Recent Labs Lab 04/04/17 0637  AST 21  ALT 20  ALKPHOS 94  BILITOT 0.6  PROT 6.8  ALBUMIN 3.2*   No results for input(s): LIPASE, AMYLASE in the last 168 hours. No results for input(s): AMMONIA in the last 168 hours. Coagulation Profile: No results for input(s): INR, PROTIME in the last 168 hours. Cardiac Enzymes:  Recent Labs Lab 04/03/17 2004 04/04/17 0115 04/04/17 0637  TROPONINI <0.03 <0.03 <0.03   BNP (last 3 results) No results for input(s): PROBNP in the last 8760 hours. HbA1C:  Recent Labs  04/04/17 0115  HGBA1C 5.3   CBG:  Recent Labs Lab 04/03/17 2121 04/04/17 0736 04/04/17 0931  GLUCAP 103* 100* 138*   Lipid Profile: No results for input(s): CHOL, HDL, LDLCALC, TRIG,  CHOLHDL, LDLDIRECT in the last 72 hours. Thyroid Function Tests:  Recent Labs  04/04/17 0115  TSH 1.820   Anemia Panel: No results for input(s): VITAMINB12, FOLATE, FERRITIN, TIBC, IRON, RETICCTPCT in the last 72 hours. Sepsis Labs: No results for input(s): PROCALCITON, LATICACIDVEN in the last 168 hours.  No results found for this or any previous visit (from the past 240 hour(s)).       Radiology Studies: No results found.      Scheduled Meds: . aspirin EC  81 mg Oral Daily  . azaTHIOprine  100 mg Oral Daily  . budesonide  0.5 mg Nebulization BID  . fenofibrate  160 mg Oral Daily  . insulin aspart  0-5 Units Subcutaneous QHS  . insulin aspart  0-9 Units Subcutaneous TID WC  . sodium chloride flush  3 mL Intravenous Q12H   Continuous Infusions: . sodium chloride       LOS: 1 day    Dron Jaynie CollinsPrasad Bhandari, MD [facility] Pager [phone]  If 7PM-7AM, please contact night-coverage [email or web address] Password [email or web address] 04/04/2017, 10:41 AM

## 2017-04-05 ENCOUNTER — Encounter (HOSPITAL_COMMUNITY)
Admission: RE | Disposition: A | Discharge status: Home / Self Care | Payer: Self-pay | Source: Other Acute Inpatient Hospital | Attending: Nephrology

## 2017-04-05 ENCOUNTER — Encounter (HOSPITAL_COMMUNITY): Payer: Self-pay | Admitting: Cardiology

## 2017-04-05 DIAGNOSIS — I208 Other forms of angina pectoris: Secondary | ICD-10-CM | POA: Diagnosis not present

## 2017-04-05 DIAGNOSIS — E119 Type 2 diabetes mellitus without complications: Secondary | ICD-10-CM | POA: Diagnosis not present

## 2017-04-05 DIAGNOSIS — K754 Autoimmune hepatitis: Secondary | ICD-10-CM | POA: Diagnosis not present

## 2017-04-05 DIAGNOSIS — E785 Hyperlipidemia, unspecified: Secondary | ICD-10-CM | POA: Diagnosis not present

## 2017-04-05 DIAGNOSIS — I25119 Atherosclerotic heart disease of native coronary artery with unspecified angina pectoris: Secondary | ICD-10-CM | POA: Diagnosis not present

## 2017-04-05 DIAGNOSIS — I25118 Atherosclerotic heart disease of native coronary artery with other forms of angina pectoris: Secondary | ICD-10-CM | POA: Diagnosis not present

## 2017-04-05 DIAGNOSIS — I10 Essential (primary) hypertension: Secondary | ICD-10-CM | POA: Diagnosis not present

## 2017-04-05 DIAGNOSIS — Z86718 Personal history of other venous thrombosis and embolism: Secondary | ICD-10-CM | POA: Diagnosis not present

## 2017-04-05 HISTORY — PX: LEFT HEART CATH AND CORONARY ANGIOGRAPHY: CATH118249

## 2017-04-05 HISTORY — PX: INTRAVASCULAR PRESSURE WIRE/FFR STUDY: CATH118243

## 2017-04-05 LAB — GLUCOSE, CAPILLARY
GLUCOSE-CAPILLARY: 99 mg/dL (ref 65–99)
Glucose-Capillary: 135 mg/dL — ABNORMAL HIGH (ref 65–99)
Glucose-Capillary: 131 mg/dL — ABNORMAL HIGH (ref 65–99)

## 2017-04-05 LAB — LIPID PANEL
CHOLESTEROL: 115 mg/dL (ref 0–200)
HDL: 35 mg/dL — ABNORMAL LOW (ref >40)
LDL Cholesterol: 66 mg/dL (ref 0–99)
Total CHOL/HDL Ratio: 3.3 RATIO
Triglycerides: 71 mg/dL (ref <150)
VLDL: 14 mg/dL (ref 0–40)

## 2017-04-05 LAB — PROTIME-INR
INR: 1.13
Prothrombin Time: 14.4 seconds (ref 11.4–15.2)

## 2017-04-05 LAB — BASIC METABOLIC PANEL
Anion gap: 4 — ABNORMAL LOW (ref 5–15)
BUN: 13 mg/dL (ref 6–20)
CHLORIDE: 109 mmol/L (ref 101–111)
CO2: 24 mmol/L (ref 22–32)
Calcium: 9.2 mg/dL (ref 8.9–10.3)
Creatinine, Ser: 0.59 mg/dL (ref 0.44–1.00)
GFR calc Af Amer: >60 mL/min (ref >60)
GFR calc non Af Amer: >60 mL/min (ref >60)
GLUCOSE: 104 mg/dL — AB (ref 65–99)
POTASSIUM: 4.2 mmol/L (ref 3.5–5.1)
Sodium: 137 mmol/L (ref 135–145)

## 2017-04-05 LAB — CBC
HCT: 31.5 % — ABNORMAL LOW (ref 36.0–46.0)
HEMOGLOBIN: 9.9 g/dL — AB (ref 12.0–15.0)
MCH: 29.7 pg (ref 26.0–34.0)
MCHC: 31.4 g/dL (ref 30.0–36.0)
MCV: 94.6 fL (ref 78.0–100.0)
Platelets: 296 10*3/uL (ref 150–400)
RBC: 3.33 MIL/uL — ABNORMAL LOW (ref 3.87–5.11)
RDW: 15.6 % — ABNORMAL HIGH (ref 11.5–15.5)
WBC: 5.7 10*3/uL (ref 4.0–10.5)

## 2017-04-05 LAB — MAGNESIUM: Magnesium: 1.8 mg/dL (ref 1.7–2.4)

## 2017-04-05 LAB — POCT ACTIVATED CLOTTING TIME: Activated Clotting Time: 219 seconds

## 2017-04-05 SURGERY — LEFT HEART CATH AND CORONARY ANGIOGRAPHY
Anesthesia: LOCAL

## 2017-04-05 MED ORDER — HEPARIN (PORCINE) IN NACL 2-0.9 UNIT/ML-% IJ SOLN
INTRAMUSCULAR | Status: DC | PRN
  Administered 2017-04-05: 08:00:00

## 2017-04-05 MED ORDER — SODIUM CHLORIDE 0.9% FLUSH: 3.0000 mL | Freq: Two times a day (BID) | INTRAVENOUS | Status: DC

## 2017-04-05 MED ORDER — VERAPAMIL HCL 2.5 MG/ML IV SOLN
INTRAVENOUS | Status: AC
  Filled 2017-04-05: qty 2

## 2017-04-05 MED ORDER — MIDAZOLAM HCL 2 MG/2ML IJ SOLN
INTRAMUSCULAR | Status: DC | PRN
  Administered 2017-04-05 (×2): 1 mg via INTRAVENOUS

## 2017-04-05 MED ORDER — FENTANYL CITRATE (PF) 100 MCG/2ML IJ SOLN
INTRAMUSCULAR | Status: DC | PRN
  Administered 2017-04-05 (×2): 25 ug via INTRAVENOUS

## 2017-04-05 MED ORDER — MIDAZOLAM HCL 2 MG/2ML IJ SOLN
INTRAMUSCULAR | Status: AC
  Filled 2017-04-05: qty 2

## 2017-04-05 MED ORDER — APIXABAN 5 MG PO TABS: 5.0000 mg | ORAL_TABLET | Freq: Two times a day (BID) | ORAL | Status: DC

## 2017-04-05 MED ORDER — SODIUM CHLORIDE 0.9% FLUSH: 3.0000 mL | INTRAVENOUS | Status: DC | PRN

## 2017-04-05 MED ORDER — IOPAMIDOL (ISOVUE-370) INJECTION 76%
INTRAVENOUS | Status: AC
  Filled 2017-04-05: qty 100

## 2017-04-05 MED ORDER — SODIUM CHLORIDE 0.9 % IV SOLN: INTRAVENOUS | Status: AC

## 2017-04-05 MED ORDER — LIDOCAINE HCL (PF) 1 % IJ SOLN
INTRAMUSCULAR | Status: DC | PRN
  Administered 2017-04-05: 2 mL

## 2017-04-05 MED ORDER — ADENOSINE 12 MG/4ML IV SOLN
INTRAVENOUS | Status: AC
  Filled 2017-04-05: qty 16

## 2017-04-05 MED ORDER — HEPARIN SODIUM (PORCINE) 1000 UNIT/ML IJ SOLN
INTRAMUSCULAR | Status: DC | PRN
  Administered 2017-04-05: 6000 [IU] via INTRAVENOUS
  Administered 2017-04-05: 3000 [IU] via INTRAVENOUS
  Administered 2017-04-05: 4000 [IU] via INTRAVENOUS

## 2017-04-05 MED ORDER — LIDOCAINE HCL (PF) 1 % IJ SOLN
INTRAMUSCULAR | Status: AC
  Filled 2017-04-05: qty 30

## 2017-04-05 MED ORDER — IOPAMIDOL (ISOVUE-370) INJECTION 76%
INTRAVENOUS | Status: DC | PRN
  Administered 2017-04-05: 95 mL via INTRAVENOUS

## 2017-04-05 MED ORDER — SODIUM CHLORIDE 0.9 % IV SOLN
250.0000 mL | INTRAVENOUS | Status: DC | PRN
Start: 2017-04-05 — End: 2017-04-05

## 2017-04-05 MED ORDER — HEPARIN SODIUM (PORCINE) 1000 UNIT/ML IJ SOLN
INTRAMUSCULAR | Status: AC
  Filled 2017-04-05: qty 1

## 2017-04-05 MED ORDER — ADENOSINE (DIAGNOSTIC) 140MCG/KG/MIN
INTRAVENOUS | Status: DC | PRN
  Administered 2017-04-05: 140 ug/kg/min via INTRAVENOUS

## 2017-04-05 MED ORDER — VERAPAMIL HCL 2.5 MG/ML IV SOLN
INTRAVENOUS | Status: DC | PRN
  Administered 2017-04-05: 08:00:00 via INTRA_ARTERIAL

## 2017-04-05 MED ORDER — HEPARIN (PORCINE) IN NACL 2-0.9 UNIT/ML-% IJ SOLN
INTRAMUSCULAR | Status: AC
  Filled 2017-04-05: qty 1000

## 2017-04-05 MED ORDER — FENTANYL CITRATE (PF) 100 MCG/2ML IJ SOLN
INTRAMUSCULAR | Status: AC
  Filled 2017-04-05: qty 2

## 2017-04-05 SURGICAL SUPPLY — 14 items
CATH INFINITI 5 FR AR1 MOD (CATHETERS) ×2 IMPLANT
CATH INFINITI JR4 5F (CATHETERS) ×2 IMPLANT
CATH OPTITORQUE TIG 4.0 5F (CATHETERS) ×2 IMPLANT
CATH VISTA GUIDE 6FR XBLAD3.5 (CATHETERS) ×2 IMPLANT
DEVICE RAD COMP TR BAND LRG (VASCULAR PRODUCTS) ×2 IMPLANT
GLIDESHEATH SLEND SS 6F .021 (SHEATH) ×2 IMPLANT
GUIDEWIRE INQWIRE 1.5J.035X260 (WIRE) ×1 IMPLANT
GUIDEWIRE PRESSURE COMET II (WIRE) ×2 IMPLANT
INQWIRE 1.5J .035X260CM (WIRE) ×2
KIT ESSENTIALS PG (KITS) ×2 IMPLANT
KIT HEART LEFT (KITS) ×2 IMPLANT
PACK CARDIAC CATHETERIZATION (CUSTOM PROCEDURE TRAY) ×2 IMPLANT
TRANSDUCER W/STOPCOCK (MISCELLANEOUS) ×2 IMPLANT
TUBING CIL FLEX 10 FLL-RA (TUBING) ×2 IMPLANT

## 2017-04-05 NOTE — H&P (View-Only) (Signed)
Cardiology Consultation:   Patient ID: Nancy Simmons; [id number]; [birth year]   Admit date: 04/03/2017 Date of Consult: 04/04/2017  Primary Care Provider: Wilmer Floor., MD Primary Cardiologist: New   Patient Profile:   Nancy Simmons is a 61 y.o. 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  History of Present Illness:   [name] presented to [facility] on 04/03/17 with sharp chest pain. A cardiac stress test was done which was positive for reversible ischemia of the anterior septal and inferior lateral wall. This was a low to intermediate risk stress test. The patient was transferred to [facility] for further cardiac evaluation and possible cardiac catheterization.  Significant findings: Troponins: <0.03, <0.03, <0.03 SCr 0.60,   K+  4.0,  magnesium 1.5  Hgb 9.8 TSH 1.820 Hemoglobin A1c 5.3  Past Medical History:  Diagnosis Date  . Arthritis    "pretty much all over" (04/03/2017)  . Asthma   . Autoimmune hepatitis (HCC)   . Family history of adverse reaction to anesthesia    "granddaughter has PONV"  . GERD (gastroesophageal reflux disease)   . High cholesterol   . History of blood transfusion    "in [place]; related to knee surgeries"  . OSA on CPAP   . Type II diabetes mellitus (HCC)     Past Surgical History:  Procedure Laterality Date  . ABDOMINAL HYSTERECTOMY    . BACK SURGERY    . CARDIAC CATHETERIZATION    . CARPAL TUNNEL RELEASE Bilateral    "done in [place]"  . CHOLECYSTECTOMY    . INCISION AND DRAINAGE Right 2015   S/P knee revision  . JOINT REPLACEMENT    . POSTERIOR FUSION LUMBAR SPINE  05/2010  . REVISION TOTAL KNEE ARTHROPLASTY Right 2015  . SHOULDER ARTHROSCOPY WITH ROTATOR CUFF REPAIR Right   . SHOULDER OPEN ROTATOR CUFF REPAIR Left    "done in  [place]"  . TOTAL KNEE ARTHROPLASTY Bilateral 1990s     Home Medications:  Prior to Admission medications   Medication Sig Start Date End Date Taking? Authorizing Provider  apixaban (ELIQUIS) 5 MG TABS tablet Take 5 mg by mouth 2 (two) times daily.   Yes [provider]  azathioprine (IMURAN) 100 MG tablet Take 100 mg by mouth daily.   Yes [provider]  beclomethasone (QVAR) 80 MCG/ACT inhaler Inhale 2 puffs into the lungs 2 (two) times daily. Reported on 01/16/2016   Yes [provider]  Choline Fenofibrate (TRILIPIX) 135 MG capsule Take 135 mg by mouth daily.   Yes [provider]  fesoterodine (TOVIAZ) 8 MG TB24 tablet Take 8 mg by mouth daily.   Yes [provider]  gabapentin (NEURONTIN) 100 MG capsule Take 100 mg by mouth 3 (three) times daily.   Yes [provider]  hydrOXYzine (ATARAX/VISTARIL) 25 MG tablet Take 25 mg by mouth 3 (three) times daily as needed (for hives).   Yes [provider]  Ipratropium-Albuterol (COMBIVENT RESPIMAT) 20-100 MCG/ACT AERS respimat Inhale 1 puff into the lungs every 6 (six) hours.   Yes [provider]  ipratropium-albuterol (DUONEB) 0.5-2.5 (3) MG/3ML SOLN Take 3 mLs by nebulization every 6 (six) hours as needed (for shortrness of breath). Reported on 01/16/2016   Yes [provider]  meclizine (ANTIVERT) 25 MG tablet Take  25 mg by mouth 3 (three) times daily as needed for dizziness.   Yes [provider]  meloxicam (MOBIC) 7.5 MG tablet Take 7.5 mg by mouth 2 (two) times daily.   Yes [provider]  metFORMIN (GLUCOPHAGE) 500 MG tablet Take 500 mg by mouth 2 (two) times daily with a meal.   Yes [provider]  Multiple Vitamins-Minerals (DAILY MULTIVITAMIN PO) Take 1 tablet by mouth daily.    Yes [provider]  OxyCODONE HCl, Abuse Deter, (OXAYDO) 5 MG TABA Take 10 mg by mouth 4 (four) times daily as needed (pain). Reported on  01/16/2016   Yes [provider]  pantoprazole (PROTONIX) 40 MG tablet Take 40 mg by mouth daily.   Yes [provider]  tiotropium (SPIRIVA) 18 MCG inhalation capsule Place 18 mcg into inhaler and inhale daily.   Yes [provider]    Inpatient Medications: Scheduled Meds: . aspirin EC  81 mg Oral Daily  . azaTHIOprine  100 mg Oral Daily  . budesonide  0.5 mg Nebulization BID  . fenofibrate  160 mg Oral Daily  . insulin aspart  0-5 Units Subcutaneous QHS  . insulin aspart  0-9 Units Subcutaneous TID WC  . sodium chloride flush  3 mL Intravenous Q12H   Continuous Infusions: . sodium chloride     PRN Meds: sodium chloride, ipratropium-albuterol, ondansetron **OR** ondansetron (ZOFRAN) IV, oxyCODONE, sodium chloride flush  Allergies:    Allergies  Allergen Reactions  . Tylenol [Acetaminophen]   . Penicillin G Rash    Social History:   Social History   Social History  . Marital status: Single    Spouse name: N/A  . Number of children: N/A  . Years of education: N/A   Occupational History  . Not on file.   Social History Main Topics  . Smoking status: Current Every Day Smoker    Packs/day: 0.50    Years: 44.00    Types: Cigarettes    Start date: 07/23/1972  . Smokeless tobacco: Never Used  . Alcohol use No  . Drug use: No  . Sexual activity: Not Currently   Other Topics Concern  . Not on file   Social History Narrative  . No narrative on file    Family History:    Family History  Problem Relation Age of Onset  . CAD Mother   . CAD Father   . Diabetes Father   . CAD Brother   . Diabetes Brother   . Colon cancer Other   . Stroke Other      ROS:  Please see the history of present illness.  ROS  All other ROS reviewed and negative.     Physical Exam/Data:   Vitals:   04/03/17 2333 04/04/17 0601 04/04/17 0754 04/04/17 0755  BP:  (!) 127/57    Pulse:  77 (!) 6   Resp:   16   Temp:  98.8 F (37.1 C)    TempSrc:  Oral      SpO2: 98% 100%  98%  Weight:  261 lb 9.6 oz (118.7 kg)    Height:        Intake/Output Summary (Last 24 hours) at 04/04/17 1440 Last data filed at 04/04/17 0800  Gross per 24 hour  Intake              180 ml  Output                0 ml  Net  180 ml   Filed Weights   04/03/17 1800 04/04/17 0601  Weight: 262 lb 2 oz (118.9 kg) 261 lb 9.6 oz (118.7 kg)   Body mass index is 47.85 kg/m.  General:  Well nourished, well developed, Obese female, in no acute distress HEENT: normal Lymph: no adenopathy Neck: no JVD Endocrine:  No thryomegaly Vascular: No carotid bruits; FA pulses 2+ bilaterally without bruits  Cardiac:  normal S1, S2; RRR; no murmur  Lungs:  clear to auscultation bilaterally, no wheezing, rhonchi or rales  Abd: soft, nontender, no hepatomegaly  Ext: no edema Musculoskeletal:  No deformities, BUE and BLE strength normal and equal Skin: warm and dry  Neuro:  CNs 2-12 intact, no focal abnormalities noted Psych:  Normal affect   Telemetry:  Telemetry was personally reviewed and demonstrates:  NSR in the 60's-70's. 4 beats NSVT  Relevant CV Studies:  Cardiac stress test was positive forreversible ischemia of anterior septal and inferior lateral wall low to intermediate stress test  Laboratory Data:  Chemistry Recent Labs Lab 04/04/17 0637  NA 137  K 4.0  CL 108  CO2 23  GLUCOSE 110*  BUN 12  CREATININE 0.60  CALCIUM 9.2  GFRNONAA >60  GFRAA >60  ANIONGAP 6     Recent Labs Lab 04/04/17 0637  PROT 6.8  ALBUMIN 3.2*  AST 21  ALT 20  ALKPHOS 94  BILITOT 0.6   Hematology Recent Labs Lab 04/04/17 0637  WBC 5.1  RBC 3.29*  HGB 9.8*  HCT 31.1*  MCV 94.5  MCH 29.8  MCHC 31.5  RDW 15.6*  PLT 280   Cardiac Enzymes Recent Labs Lab 04/03/17 2004 04/04/17 0115 04/04/17 0637  TROPONINI <0.03 <0.03 <0.03   No results for input(s): TROPIPOC in the last 168 hours.  BNPNo results for input(s): BNP, PROBNP in the last 168 hours.   DDimer No results for input(s): DDIMER in the last 168 hours.  Radiology/Studies:  No results found.  Assessment and Plan:   Chest pain/unstable angina -[facility] with sharp chest pain. Troponins were negative 3. The patient had an immediate risk stress test showing reversible ischemia. [facility] cardiac evaluation and possible cardiac catheterization -currently chest pain free.  -Continue aspirin -SCr  0.60 -Plan for cath first case tomorrow.   Hypertension  -On no blood pressure lowering medications at home -Blood pressure is currently well controlled, was a little elevated last evening  Type 2 diabetes -Hemoglobin A1c 5.3.  -CBGs and sliding scale insulin per internal medicine. -Hold metformin with plans for cardiac cath and then for 48 hours  Hypomagnesemia -Magnesium level I.5. Supplementation given  History of PICC-induced DVT -Patient on Eliquis. Currently on hold for potential Cardiac catheterization. Last dose was yesterday am per pt.   Hyperlipidemia  -Treated with fenofibrate  -Pt does not think she has tried statin in the past. If cath shows CAD will need statin.  -Will check FLP   For questions or updates, please contact [facility] Please consult [email or web address] for contact info under Cardiology/STEMI.   Signed, Berton BonJanine Levy Wellman, NP  04/04/2017 2:40 PM

## 2017-04-05 NOTE — Progress Notes (Signed)
Pharmacy note: apixiban  61 yo female on apixaban PTA now s/p cath to resume 6 hours post TR band removal (removed at about 9pm). She was on apixaban  po bid at home  Plan Will -resume apixaban  po bid at 10pm  Harland German, Pharm D 04/05/2017 9:55 AM

## 2017-04-05 NOTE — Discharge Summary (Signed)
Physician Discharge Summary  ARDELL AARONSON WUJ:[id number] DOB: 1956-06-02 DOA: 04/03/2017  PCP: Wilmer Floor., MD  Admit date: 04/03/2017 Discharge date: 04/05/2017  Admitted From:[facility], came from Home Disposition:home  Recommendations for Outpatient Follow-up:  1. Follow up with PCP in 1-2 weeks 2. 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 Cardiac stress test was positive forreversible ischemia of anterior septal and inferior lateral wall low to intermediate stress test,  cardiology was consulted who recommended transfer to [facility] for cardiac cath. Patient underwent cardiac catheterization with no flow-limiting lesion. Discussed with Dr. Mayford Knife from cardiology. Patient with no chest pain or shortness of breath today. Recommended to resume Eliquis from tomorrow. Resume home medication on discharge. Recommended to follow-up with PCP.  # Chest pain/unstable angina: Cardiac cath unremarkable. Patient is chest pain-free.  #Hypertension: Blood pressure acceptable. Continue to monitor  #History of autoimmune hepatitis: Resume home medications including azathioprine  #Type 2 diabetes: Continue metformin. Monitor blood sugar level. A1c 5.3  #Hypomagnesemia: Repleted magnesium sulfate. Magnesium level acceptable.  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 #  OSA on CPAP Normocytic anemia: Hemoglobin is stable.  Discharge Diagnoses:  Active Problems:   HTN (hypertension)   Hypercholesterolemia   Autoimmune hepatitis (HCC)   Stable angina (HCC)   DM2 (diabetes mellitus, type 2) (HCC)   Asthma    OSA on CPAP   Angina at rest [facility])   Abnormal stress echocardiography    Discharge Instructions  Discharge Instructions    Call MD for:  difficulty breathing, headache or visual disturbances    Complete by:  As directed    Call MD for:  extreme fatigue    Complete by:  As directed    Call MD for:  hives    Complete by:  As directed    Call MD for:  persistant dizziness or light-headedness    Complete by:  As directed    Call MD for:  persistant nausea and vomiting    Complete by:  As directed    Call MD for:  severe uncontrolled pain    Complete by:  As directed    Call MD for:  temperature >100.4    Complete by:  As directed    Diet - low sodium heart healthy    Complete by:  As directed    Increase activity slowly    Complete by:  As directed      Allergies as of 04/05/2017      Reactions   Tylenol [acetaminophen]    Penicillin G Rash      Medication List    TAKE these medications   apixaban 5 MG Tabs tablet Commonly known as:  ELIQUIS Take 5 mg by mouth 2 (two) times daily.   azathioprine 100 MG tablet Commonly known as:  IMURAN Take 100 mg by mouth daily.   beclomethasone 80 MCG/ACT inhaler Commonly known as:  QVAR Inhale 2 puffs into the lungs 2 (two) times daily. Reported on 01/16/2016   DAILY MULTIVITAMIN PO Take 1 tablet by mouth daily.   fesoterodine 8 MG Tb24 tablet Commonly known as:  TOVIAZ Take 8 mg by mouth daily.   gabapentin 100 MG capsule Commonly known as:  NEURONTIN Take 100 mg by mouth 3 (three) times daily.   hydrOXYzine 25 MG tablet Commonly known as:  ATARAX/VISTARIL Take 25 mg by mouth 3 (three) times daily as needed (for hives).   ipratropium-albuterol 0.5-2.5 (3) MG/3ML Soln Commonly known as:  DUONEB Take 3 mLs by nebulization every 6 (six) hours as needed (for shortrness of breath). Reported on 01/16/2016   COMBIVENT RESPIMAT 20-100 MCG/ACT Aers respimat Generic drug:  Ipratropium-Albuterol Inhale 1 puff into the lungs  every 6 (six) hours.   meclizine 25 MG tablet Commonly known as:  ANTIVERT Take 25 mg by mouth 3 (three) times daily as needed for dizziness.   meloxicam 7.5 MG tablet Commonly known as:  MOBIC Take 7.5 mg by mouth 2 (two) times daily.   metFORMIN 500 MG tablet Commonly known as:  GLUCOPHAGE Take 500 mg by mouth 2 (two) times daily with a meal.   OxyCODONE HCl (Abuse Deter) 5 MG Taba Commonly known as:  OXAYDO Take 10 mg by mouth 4 (four) times daily as needed (pain). Reported on 01/16/2016   pantoprazole 40 MG tablet Commonly known as:  PROTONIX Take 40 mg by mouth daily.   tiotropium 18 MCG inhalation capsule Commonly known as:  SPIRIVA Place 18 mcg into inhaler and inhale daily.   TRILIPIX 135 MG capsule Generic drug:  Choline Fenofibrate Take 135 mg by mouth daily.            Discharge Care Instructions        Start     Ordered   04/05/17 0000  Increase activity slowly     04/05/17 1122   04/05/17 0000  Diet - low sodium heart healthy     04/05/17 1122   04/05/17 0000  Call MD for:  temperature >100.4     04/05/17 1122   04/05/17 0000  Call MD for:  persistant nausea and vomiting     04/05/17 1122   04/05/17 0000  Call MD for:  severe uncontrolled pain     04/05/17 1122   04/05/17 0000  Call MD for:  difficulty breathing, headache or visual disturbances     04/05/17 1122   04/05/17 0000  Call MD for:  hives     04/05/17 1122   04/05/17 0000  Call MD for:  persistant dizziness or light-headedness     04/05/17 1122   04/05/17 0000  Call MD for:  extreme fatigue     04/05/17 1122     Follow-up Information    Wilmer Floor., MD. Schedule an appointment as soon as possible for a visit in 1 week(s).   Specialty:  Internal Medicine Contact information: [address] [place] [address] [phone]          Allergies  Allergen Reactions  . Tylenol [Acetaminophen]   . Penicillin G Rash     Consultations: Cardiology  Procedures/Studies: None seen and examined at bedside. Denied headache, dizziness, nausea vomiting chest pressure shortness affect  Subjective:   Discharge Exam: Vitals:   04/05/17 1035 04/05/17 1105  BP: (!) 145/76 (!) 149/74  Pulse:    Resp:    Temp:    SpO2:     Vitals:   04/05/17 0955 04/05/17 1010 04/05/17 1035 04/05/17 1105  BP: (!) 142/61 (!) 156/87 (!) 145/76 (!) 149/74  Pulse:      Resp:      Temp:      TempSrc:      SpO2:  Weight:      Height:        General: Pt is alert, awake, not in acute distress Cardiovascular: RRR, S1/S2 +, no rubs, no gallops Respiratory: CTA bilaterally, no wheezing, no rhonchi Abdominal: Soft, NT, ND, bowel sounds + Extremities: no edema, no cyanosis    The results of significant diagnostics from this hospitalization (including imaging, microbiology, ancillary and laboratory) are listed below for reference.     Microbiology: No results found for this or any previous visit (from the past 240 hour(s)).   Labs: BNP (last 3 results) No results for input(s): BNP in the last 8760 hours. Basic Metabolic Panel:  Recent Labs Lab 04/04/17 0637 04/05/17 0206  NA 137 137  K 4.0 4.2  CL 108 109  CO2 23 24  GLUCOSE 110* 104*  BUN 12 13  CREATININE 0.60 0.59  CALCIUM 9.2 9.2  MG 1.5* 1.8  PHOS 3.7  --    Liver Function Tests:  Recent Labs Lab 04/04/17 0637  AST 21  ALT 20  ALKPHOS 94  BILITOT 0.6  PROT 6.8  ALBUMIN 3.2*   No results for input(s): LIPASE, AMYLASE in the last 168 hours. No results for input(s): AMMONIA in the last 168 hours. CBC:  Recent Labs Lab 04/04/17 0637 04/05/17 0206  WBC 5.1 5.7  HGB 9.8* 9.9*  HCT 31.1* 31.5*  MCV 94.5 94.6  PLT 280 296   Cardiac Enzymes:  Recent Labs Lab 04/03/17 2004 04/04/17 0115 04/04/17 0637  TROPONINI <0.03 <0.03 <0.03   BNP: Invalid input(s): POCBNP CBG:  Recent Labs Lab 04/04/17 1155 04/04/17 1653  04/04/17 2048 04/05/17 0948 04/05/17 1111  GLUCAP 93 105* 114* 99 135*   D-Dimer No results for input(s): DDIMER in the last 72 hours. Hgb A1c  Recent Labs  04/04/17 0115  HGBA1C 5.3   Lipid Profile  Recent Labs  04/05/17 0206  CHOL 115  HDL 35*  LDLCALC 66  TRIG 71  CHOLHDL 3.3   Thyroid function studies  Recent Labs  04/04/17 0115  TSH 1.820   Anemia work up No results for input(s): VITAMINB12, FOLATE, FERRITIN, TIBC, IRON, RETICCTPCT in the last 72 hours. Urinalysis No results found for: COLORURINE, APPEARANCEUR, LABSPEC, PHURINE, GLUCOSEU, HGBUR, BILIRUBINUR, KETONESUR, PROTEINUR, UROBILINOGEN, NITRITE, LEUKOCYTESUR Sepsis Labs Invalid input(s): PROCALCITONIN,  WBC,  LACTICIDVEN Microbiology No results found for this or any previous visit (from the past 240 hour(s)).   Time coordinating discharge: 28 minutes  SIGNED:   Maxie Barb, MD  [facility] 04/05/2017, 11:23 AM  If 7PM-7AM, please contact night-coverage [email or web address] Password [email or web address]

## 2017-04-05 NOTE — Interval H&P Note (Signed)
History and Physical Interval Note:  04/05/2017 7:15 AM  Nancy Simmons  has presented today for surgery, with the diagnosis of positive stress test with CP presentation. The various methods of treatment have been discussed with the patient and family. After consideration of risks, benefits and other options for treatment, the patient has consented to  Procedure(s): LEFT HEART CATH AND CORONARY ANGIOGRAPHY (N/A) with possible Percutaneous Coronary Intervention as a surgical intervention .  The patient's history has been reviewed, patient examined, no change in status, stable for surgery.  I have reviewed the patient's chart and labs.  Questions were answered to the patient's satisfaction.    Cath Lab Visit (complete for each Cath Lab visit)  Clinical Evaluation Leading to the Procedure:   ACS: No.  Non-ACS:    Anginal Classification: CCS II  Anti-ischemic medical therapy: Minimal Therapy (1 class of medications)  Non-Invasive Test Results: Intermediate-risk stress test findings: cardiac mortality 1-3%/year  Prior CABG: No previous CABG   Bryan Lemma

## 2017-11-04 ENCOUNTER — Encounter (INDEPENDENT_AMBULATORY_CARE_PROVIDER_SITE_OTHER): Payer: Self-pay | Admitting: Orthopaedic Surgery

## 2017-11-04 ENCOUNTER — Ambulatory Visit (INDEPENDENT_AMBULATORY_CARE_PROVIDER_SITE_OTHER): Payer: Medicaid Other

## 2017-11-04 ENCOUNTER — Ambulatory Visit (INDEPENDENT_AMBULATORY_CARE_PROVIDER_SITE_OTHER): Payer: Medicaid Other | Admitting: Orthopaedic Surgery

## 2017-11-04 DIAGNOSIS — M25552 Pain in left hip: Secondary | ICD-10-CM | POA: Diagnosis not present

## 2017-11-04 DIAGNOSIS — M1612 Unilateral primary osteoarthritis, left hip: Secondary | ICD-10-CM | POA: Diagnosis not present

## 2017-11-04 NOTE — Progress Notes (Signed)
Office Visit Note   Patient: Nancy PlantsDoris B Patriarca           Date of Birth: 03/18/1956           MRN: [id number] Visit Date: 11/04/2017              Requested by: Wilmer Floorampbell, Stephen D., MD [address] [place], [place] [id number] PCP: Wilmer Floorampbell, Stephen D., MD   Assessment & Plan: Visit Diagnoses:  1. Pain in left hip   2. Unilateral primary osteoarthritis, left hip     Plan: I do feel that she is a candidate for anterior hip replacement surgery however she definitely needs clearance from her primary care physician in terms of looking at her blood glucose and assessing hemoglobin A1c for blood glucose control.  Her heart catheterization report is on the system from September of last year and looks good.  I still feel that she needs the appropriate medical clearance prior to proceed with surgery.  We talked about the risk and benefits of the surgery and all of her heightened risk due to her weight and diabetes.  I gave her handout on hip revision surgery as well.  I would like to see her back in 3 weeks to see how she is doing overall and to determine whether or not we can safely proceed with surgery  Follow-Up Instructions: Return in about 3 weeks (around 11/25/2017).   Orders:  Orders Placed This Encounter  Procedures  . XR HIP UNILAT W OR W/O PELVIS 1V LEFT   No orders of the defined types were placed in this encounter.     Procedures: No procedures performed   Clinical Data: No additional findings.   Subjective: Chief Complaint  Patient presents with  . Left Leg - Follow-up  The patient somewhat I am seeing for the first time.  She has severe debilitating well-documented arthritis of her left hip.  She is been seen at other facilities and they have not been able to perform surgery on her due to her obesity.  She has a body mass index of 48 and a weight of 270 pounds.  She lives with a rolling walker.  She is a diabetic.  The last hemoglobin A1c that is on our system  was below 8.  She is on chronic narcotic pain medications due to the pain in her hip.  She is also on Eliquis there is a blood clot in the past.  Anytime she had a procedure she has been told she can stop her Eliquis for 4-5 days.  Her pain is daily and it is 10 out of 10.  Her left hip pain is detrimentally affect her active daily living, quality of life, and her mobility.  She does have a history of bilateral knee replacements as well.  HPI  Review of Systems She currently denies any fever, chills, nausea, vomiting, headache, shortness of breath.  Objective: Vital Signs: There were no vitals taken for this visit.  Physical Exam She is alert and oriented x3 and in no acute distress Ortho Exam She ambulates with a rolling walker.  She has limited mobility due to her obesity.  She has severe pain with internal and external rotation of her left hip and her left hip is quite stiff on rotation.  I did have her lay in a supine position on the exam table so I can see if I can mobilize her abdomen to safely perform a direct anterior hip approach.  We assessed her thigh weight as well. Specialty Comments:  No specialty comments available.  Imaging: Xr Hip Unilat W Or W/o Pelvis 1v Left  Result Date: 11/04/2017 An AP pelvis and lateral left hip shows severe end-stage arthritis of the left hip.  There is complete loss of joint space.  There is cystic change in the femoral head as well as para-articular osteophytes and sclerotic changes.    PMFS History: Patient Active Problem List   Diagnosis Date Noted  . Unilateral primary osteoarthritis, left hip 11/04/2017  . Abnormal stress echocardiography   . Stable angina (HCC) 04/03/2017  . DM2 (diabetes mellitus, type 2) (HCC) 04/03/2017  . Asthma 04/03/2017  . OSA on CPAP 04/03/2017  . Angina at rest [facility]) 04/03/2017  . Medication monitoring encounter 07/09/2016  . Autoimmune hepatitis (HCC) 07/09/2016  . Infection of prosthetic right knee joint  (HCC) 09/27/2015  . HTN (hypertension) 09/27/2015  . CAD (coronary artery disease) of artery bypass graft 09/27/2015  . Hypercholesterolemia 09/27/2015  . S/P cholecystectomy 09/27/2015   Past Medical History:  Diagnosis Date  . Arthritis    "pretty much all over" (04/03/2017)  . Asthma   . Autoimmune hepatitis (HCC)   . Family history of adverse reaction to anesthesia    "granddaughter has PONV"  . GERD (gastroesophageal reflux disease)   . High cholesterol   . History of blood transfusion    "in [place]; related to knee surgeries"  . OSA on CPAP   . Type II diabetes mellitus (HCC)     Family History  Problem Relation Age of Onset  . CAD Mother   . CAD Father   . Diabetes Father   . CAD Brother   . Diabetes Brother   . Colon cancer Other   . Stroke Other     Past Surgical History:  Procedure Laterality Date  . ABDOMINAL HYSTERECTOMY    . BACK SURGERY    . CARDIAC CATHETERIZATION    . CARPAL TUNNEL RELEASE Bilateral    "done in [place]"  . CHOLECYSTECTOMY    . INCISION AND DRAINAGE Right 2015   S/P knee revision  . INTRAVASCULAR PRESSURE WIRE/FFR STUDY N/A 04/05/2017   Procedure: INTRAVASCULAR PRESSURE WIRE/FFR STUDY;  Surgeon: Marykay Lex, MD;  Location: [facility] INVASIVE CV LAB;  Service: Cardiovascular;  Laterality: N/A;  . JOINT REPLACEMENT    . LEFT HEART CATH AND CORONARY ANGIOGRAPHY N/A 04/05/2017   Procedure: LEFT HEART CATH AND CORONARY ANGIOGRAPHY;  Surgeon: Marykay Lex, MD;  Location: [facility] INVASIVE CV LAB;  Service: Cardiovascular;  Laterality: N/A;  . POSTERIOR FUSION LUMBAR SPINE  05/2010  . REVISION TOTAL KNEE ARTHROPLASTY Right 2015  . SHOULDER ARTHROSCOPY WITH ROTATOR CUFF REPAIR Right   . SHOULDER OPEN ROTATOR CUFF REPAIR Left    "done in [place]"  . TOTAL KNEE ARTHROPLASTY Bilateral 1990s   Social History   Occupational History  . Not on file  Tobacco Use  . Smoking status: Current Every Day Smoker    Packs/day: 0.50    Years: 44.00     Pack years: 22.00    Types: Cigarettes    Start date: 07/23/1972  . Smokeless tobacco: Never Used  Substance and Sexual Activity  . Alcohol use: No    Alcohol/week: 0.0 oz  . Drug use: No  . Sexual activity: Not Currently

## 2017-11-12 ENCOUNTER — Telehealth (INDEPENDENT_AMBULATORY_CARE_PROVIDER_SITE_OTHER): Payer: Self-pay | Admitting: Orthopaedic Surgery

## 2017-11-12 NOTE — Telephone Encounter (Signed)
Patient lost the paper from her last visit listing what she needed for surgry, she thinks this included the medical clearance and a1c. She has appt with Dr. Orvan Falconerampbell tomorrow and wants to see if we could fax that paper over with the list since she lost hers. Fax # [phone] attn: Dr. Orvan Falconerampbell

## 2017-11-13 NOTE — Telephone Encounter (Signed)
Faxed clearance request to Dr. Orvan Falconerampbell.

## 2017-11-18 ENCOUNTER — Telehealth (INDEPENDENT_AMBULATORY_CARE_PROVIDER_SITE_OTHER): Payer: Self-pay | Admitting: Orthopaedic Surgery

## 2017-11-18 NOTE — Telephone Encounter (Signed)
error 

## 2017-11-25 ENCOUNTER — Encounter (INDEPENDENT_AMBULATORY_CARE_PROVIDER_SITE_OTHER): Payer: Self-pay | Admitting: Orthopaedic Surgery

## 2017-11-25 ENCOUNTER — Ambulatory Visit (INDEPENDENT_AMBULATORY_CARE_PROVIDER_SITE_OTHER): Payer: Medicaid Other | Admitting: Orthopaedic Surgery

## 2017-11-25 DIAGNOSIS — M25552 Pain in left hip: Secondary | ICD-10-CM | POA: Diagnosis not present

## 2017-11-25 DIAGNOSIS — M1612 Unilateral primary osteoarthritis, left hip: Secondary | ICD-10-CM

## 2017-11-25 NOTE — Progress Notes (Signed)
The patient is here today again to talk about a left hip replacement.  Her last visit with [place] 3 weeks ago we went over in detail with the surgery involves.  She is had hip pain for well over a year now and is tried and failed all forms conservative treatment.  Her x-rays show complete loss of the superior lateral joint space on the left hip.  There are periarticular osteophytes and flattening the femoral head.  There is cystic changes as well in the femoral head.  She embolus with a rolling walker.  She is morbidly obese with a BMI of 47.9.  She is a diabetic but reports good control.  According to her she is been seen by her primary care physician and they have sent [place] clearance.  All questions and concerns were answered her last visit.  We went over what hip replacement surgery involves in detail.  She is had a handout about this.  I assessed her anatomically in terms of assessing the planes of her soft tissues for success of performing left hip replacement surgery.  She understands the biggest risk for the surgery relates her diabetes and her obesity and there is a high incidence of soft tissue complications.  Having explained this to her she does still wish to proceed with surgery given the debilitating nature of her pain.  I agree with this based on her x-rays and clinical exam as well.  We will work on getting this scheduled.

## 2017-11-27 ENCOUNTER — Other Ambulatory Visit (INDEPENDENT_AMBULATORY_CARE_PROVIDER_SITE_OTHER): Payer: Self-pay | Admitting: Physician Assistant

## 2017-11-29 NOTE — Pre-Procedure Instructions (Signed)
Nancy Simmons  11/29/2017      [facility] [place], [place] - [address] [place] [address] Phone: [phone] Fax: [phone]    Your procedure is scheduled on 12/10/2017.  Report to [facility] Admitting at 1000 A.M.  Call this number if you have problems the morning of surgery:  [phone]   Remember:  Do not eat food or drink liquids after midnight.  Take these medicines the morning of surgery with A SIP OF WATER: Albuterol nebulizer - if needed Qvar inhaler Fesoterodine (Toviaz) Gabapentin (Neurontin) - if needed Ipratropium-Albuterol (Combivent Respimat) - if needed Meclizine (Antivert) - if needed Oxycodone HCl Pantoprazole (Protonix) Tiotropium (Spiriva) inhaler - if needed Tizanidine (Zanaflex) - if needed  7 days prior to surgery STOP taking any Meloxicam (Mobic), Aleve, Naproxen, Ibuprofen, Motrin, Advil, Goody's, BC's, all herbal medications, fish oil, and all vitamins.  Follow your doctors instructions regarding your Aspirin and Eliquis.  If no instructions were given by your doctor, then you will need to call the prescribing office office to get instructions.    WHAT DO I DO ABOUT MY DIABETES MEDICATION?  Marland Kitchen Do not take oral diabetes medicines (pills) the morning of surgery.    How to Manage Your Diabetes Before and After Surgery  Why is it important to control my blood sugar before and after surgery? . Improving blood sugar levels before and after surgery helps healing and can limit problems. . A way of improving blood sugar control is eating a healthy diet by: o  Eating less sugar and carbohydrates o  Increasing activity/exercise o  Talking with your doctor about reaching your blood sugar goals . High blood sugars (greater than 180 mg/dL) can raise your risk of infections and slow your recovery, so you will need to focus on controlling your diabetes during the weeks before surgery. . Make sure that the  doctor who takes care of your diabetes knows about your planned surgery including the date and location.  How do I manage my blood sugar before surgery? . Check your blood sugar at least 4 times a day, starting 2 days before surgery, to make sure that the level is not too high or low. o Check your blood sugar the morning of your surgery when you wake up and every 2 hours until you get to the Short Stay unit. . If your blood sugar is less than 70 mg/dL, you will need to treat for low blood sugar: o Do not take insulin. o Treat a low blood sugar (less than 70 mg/dL) with  cup of clear juice (cranberry or apple), 4 glucose tablets, OR glucose gel. Recheck blood sugar in 15 minutes after treatment (to make sure it is greater than 70 mg/dL). If your blood sugar is not greater than 70 mg/dL on recheck, call [phone] o  for further instructions. . Report your blood sugar to the short stay nurse when you get to Short Stay.  . If you are admitted to the hospital after surgery: o Your blood sugar will be checked by the staff and you will probably be given insulin after surgery (instead of oral diabetes medicines) to make sure you have good blood sugar levels. o The goal for blood sugar control after surgery is 80-180 mg/dL.     Do not wear jewelry, make-up or nail polish.  Do not wear lotions, powders, or perfumes, or deodorant.  Do not shave 48 hours prior to surgery.  Men may shave face and neck.  Do not bring valuables to the hospital.  [facility] is not responsible for any belongings or valuables.  Hearing Aids, eyeglasses, contacts, dentures or bridgework may not be worn into surgery.  Leave your suitcase in the car.  After surgery it may be brought to your room.  For patients admitted to the hospital, discharge time will be determined by your treatment team.  Patients discharged the day of surgery will not be allowed to drive home.   Name and phone number of your driver:    Special  instructions:   [facility]- Preparing For Surgery  Before surgery, you can play an important role. Because skin is not sterile, your skin needs to be as free of germs as possible. You can reduce the number of germs on your skin by washing with CHG (chlorahexidine gluconate) Soap before surgery.  CHG is an antiseptic cleaner which kills germs and bonds with the skin to continue killing germs even after washing.  Oral Hygiene is also important to reduce your risk of infection.  Remember - BRUSH YOUR TEETH THE MORNING OF SURGERY  Please do not use if you have an allergy to CHG or antibacterial soaps. If your skin becomes reddened/irritated stop using the CHG.  Do not shave (including legs and underarms) for at least 48 hours prior to first CHG shower. It is OK to shave your face.  Please follow these instructions carefully.   1. Shower the NIGHT BEFORE SURGERY and the MORNING OF SURGERY with CHG.   2. If you chose to wash your hair, wash your hair first as usual with your normal shampoo.  3. After you shampoo, rinse your hair and body thoroughly to remove the shampoo.  4. Use CHG as you would any other liquid soap. You can apply CHG directly to the skin and wash gently with a scrungie or a clean washcloth.   5. Apply the CHG Soap to your body ONLY FROM THE NECK DOWN.  Do not use on open wounds or open sores. Avoid contact with your eyes, ears, mouth and genitals (private parts). Wash Face and genitals (private parts)  with your normal soap.  6. Wash thoroughly, paying special attention to the area where your surgery will be performed.  7. Thoroughly rinse your body with warm water from the neck down.  8. DO NOT shower/wash with your normal soap after using and rinsing off the CHG Soap.  9. Pat yourself dry with a CLEAN TOWEL.  10. Wear CLEAN PAJAMAS to bed the night before surgery, wear comfortable clothes the morning of surgery  11. Place CLEAN SHEETS on your bed the night of your first  shower and DO NOT SLEEP WITH PETS.    Day of Surgery: Shower as stated above. Do not apply any deodorants/lotions.  Please wear clean clothes to the hospital/surgery center.   Remember to brush your teeth.      Please read over the following fact sheets that you were given.

## 2017-12-02 ENCOUNTER — Encounter (HOSPITAL_COMMUNITY): Payer: Self-pay

## 2017-12-02 ENCOUNTER — Encounter (HOSPITAL_COMMUNITY)
Admission: RE | Admit: 2017-12-02 | Discharge: 2017-12-02 | Disposition: A | Discharge status: Home / Self Care | Payer: Medicaid Other | Source: Ambulatory Visit | Attending: Orthopaedic Surgery | Admitting: Orthopaedic Surgery

## 2017-12-02 ENCOUNTER — Other Ambulatory Visit: Payer: Self-pay

## 2017-12-02 DIAGNOSIS — M1612 Unilateral primary osteoarthritis, left hip: Secondary | ICD-10-CM | POA: Diagnosis not present

## 2017-12-02 DIAGNOSIS — Z01812 Encounter for preprocedural laboratory examination: Secondary | ICD-10-CM | POA: Insufficient documentation

## 2017-12-02 DIAGNOSIS — Z7901 Long term (current) use of anticoagulants: Secondary | ICD-10-CM | POA: Insufficient documentation

## 2017-12-02 DIAGNOSIS — K219 Gastro-esophageal reflux disease without esophagitis: Secondary | ICD-10-CM | POA: Diagnosis not present

## 2017-12-02 DIAGNOSIS — Z96651 Presence of right artificial knee joint: Secondary | ICD-10-CM | POA: Diagnosis not present

## 2017-12-02 DIAGNOSIS — Z791 Long term (current) use of non-steroidal anti-inflammatories (NSAID): Secondary | ICD-10-CM | POA: Diagnosis not present

## 2017-12-02 DIAGNOSIS — I2510 Atherosclerotic heart disease of native coronary artery without angina pectoris: Secondary | ICD-10-CM | POA: Diagnosis not present

## 2017-12-02 DIAGNOSIS — Z8489 Family history of other specified conditions: Secondary | ICD-10-CM | POA: Diagnosis not present

## 2017-12-02 DIAGNOSIS — Z7984 Long term (current) use of oral hypoglycemic drugs: Secondary | ICD-10-CM | POA: Insufficient documentation

## 2017-12-02 DIAGNOSIS — E119 Type 2 diabetes mellitus without complications: Secondary | ICD-10-CM | POA: Diagnosis not present

## 2017-12-02 DIAGNOSIS — Z9049 Acquired absence of other specified parts of digestive tract: Secondary | ICD-10-CM | POA: Diagnosis not present

## 2017-12-02 DIAGNOSIS — Z90710 Acquired absence of both cervix and uterus: Secondary | ICD-10-CM | POA: Insufficient documentation

## 2017-12-02 DIAGNOSIS — Z79899 Other long term (current) drug therapy: Secondary | ICD-10-CM | POA: Insufficient documentation

## 2017-12-02 DIAGNOSIS — G4733 Obstructive sleep apnea (adult) (pediatric): Secondary | ICD-10-CM | POA: Insufficient documentation

## 2017-12-02 DIAGNOSIS — Z96611 Presence of right artificial shoulder joint: Secondary | ICD-10-CM | POA: Insufficient documentation

## 2017-12-02 DIAGNOSIS — Z9889 Other specified postprocedural states: Secondary | ICD-10-CM | POA: Diagnosis not present

## 2017-12-02 DIAGNOSIS — Z7982 Long term (current) use of aspirin: Secondary | ICD-10-CM | POA: Insufficient documentation

## 2017-12-02 DIAGNOSIS — I10 Essential (primary) hypertension: Secondary | ICD-10-CM | POA: Diagnosis not present

## 2017-12-02 DIAGNOSIS — Z86718 Personal history of other venous thrombosis and embolism: Secondary | ICD-10-CM | POA: Insufficient documentation

## 2017-12-02 HISTORY — DX: Personal history of urinary calculi: Z87.442

## 2017-12-02 HISTORY — DX: Atherosclerotic heart disease of native coronary artery without angina pectoris: I25.10

## 2017-12-02 LAB — CBC
HCT: 33.9 % — ABNORMAL LOW (ref 36.0–46.0)
HEMOGLOBIN: 10.4 g/dL — AB (ref 12.0–15.0)
MCH: 29.4 pg (ref 26.0–34.0)
MCHC: 30.7 g/dL (ref 30.0–36.0)
MCV: 95.8 fL (ref 78.0–100.0)
PLATELETS: 344 10*3/uL (ref 150–400)
RBC: 3.54 MIL/uL — AB (ref 3.87–5.11)
RDW: 15.6 % — AB (ref 11.5–15.5)
WBC: 6.2 10*3/uL (ref 4.0–10.5)

## 2017-12-02 LAB — COMPREHENSIVE METABOLIC PANEL
ALT: 15 U/L (ref 14–54)
ANION GAP: 7 (ref 5–15)
AST: 21 U/L (ref 15–41)
Albumin: 3.6 g/dL (ref 3.5–5.0)
Alkaline Phosphatase: 126 U/L (ref 38–126)
BILIRUBIN TOTAL: 0.5 mg/dL (ref 0.3–1.2)
BUN: 8 mg/dL (ref 6–20)
CALCIUM: 9.6 mg/dL (ref 8.9–10.3)
CO2: 25 mmol/L (ref 22–32)
Chloride: 111 mmol/L (ref 101–111)
Creatinine, Ser: 0.72 mg/dL (ref 0.44–1.00)
Glucose, Bld: 90 mg/dL (ref 65–99)
POTASSIUM: 4 mmol/L (ref 3.5–5.1)
Sodium: 143 mmol/L (ref 135–145)
TOTAL PROTEIN: 7.6 g/dL (ref 6.5–8.1)

## 2017-12-02 LAB — SURGICAL PCR SCREEN
MRSA, PCR: NEGATIVE
STAPHYLOCOCCUS AUREUS: POSITIVE — AB

## 2017-12-02 LAB — GLUCOSE, CAPILLARY: GLUCOSE-CAPILLARY: 86 mg/dL (ref 65–99)

## 2017-12-02 LAB — HEMOGLOBIN A1C
HEMOGLOBIN A1C: 5.7 % — AB (ref 4.8–5.6)
Mean Plasma Glucose: 116.89 mg/dL

## 2017-12-02 NOTE — Progress Notes (Addendum)
PCP - Dr. Orvan Falconer Cardiologist - patient is unsure of the doctor's name but feels it is someone at [facility] in [place], [place]; requesting records from their office Autoimmune Hepatitis - managed by Dr. Georgina Quint  Chest x-ray - n/a EKG - 04/06/2017 Stress Test - 2018; requesting records from [facility] ECHO - patient unsure when but thinks it may have been in 2014; requesting records from [facility] Cardiac Cath - 04/05/2017, Dr. Herbie [facility]  Sleep Study - yes, but patient unsure when; requesting records from [facility] CPAP - yes.  Fasting Blood Sugar - 90's-110's Checks Blood Sugar 1 time a day; DM managed by Dr. Orvan Falconer  Blood Thinner Instructions: patient states she was told to hold 5 days prior to surgery Aspirin Instructions: Patient states she was told to hold 5 days prior to surgery  Anesthesia review: yes, cardiac history  Patient denies shortness of breath, fever, cough and chest pain at PAT appointment.  Patient's BP elevated at PAT appointment.  Patient in a lot of pain.  Patient left prior to being able to recheck BP.    Patient verbalized understanding of instructions that were given to them at the PAT appointment. Patient was also instructed that they will need to review over the PAT instructions again at home before surgery.

## 2017-12-03 ENCOUNTER — Encounter (HOSPITAL_COMMUNITY): Payer: Self-pay

## 2017-12-03 NOTE — Progress Notes (Signed)
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   - holding eliquis and ASA 5 days before surgery  - does not take meds for HTN; BP at pre-admission testing was 171/89. I spoke with pt by telephone; she did not know she was supposed to be taking BP meds.  Went to see PCP 12/05/17; she reports she was started on lisinopril  daily and metoprolol  BID. I instructed pt to take metoprolol and hold lisinopril day of surgery  - Hospitalized 9/12-9/14/18 at [facility] for chest pain (was transferred here from [facility]); cath with nonobstructive CAD   VS: BP (!) 171/89   Pulse 75   Temp (!) 36.3 C   Resp 20   Ht  (1.575 m)   Wt 283 lb 9.6 oz (128.6 kg)   SpO2 98%   BMI 51.87 kg/m    PROVIDERS:  PCP is Wilmer Floor., MD   Pt does not see cardiology routinely.    LABS: Labs reviewed: Acceptable for surgery. (all labs ordered are listed, but only abnormal results are displayed)  Labs Reviewed  SURGICAL PCR SCREEN - Abnormal; Notable for the following components:      Result Value   Staphylococcus aureus POSITIVE (*)    All other components within normal limits  HEMOGLOBIN A1C - Abnormal; Notable for the following components:   Hgb A1c MFr Bld 5.7 (*)    All other components within normal limits  CBC - Abnormal; Notable for the following components:   RBC 3.54 (*)    Hemoglobin 10.4 (*)    HCT 33.9 (*)    RDW 15.6 (*)    All other components within normal limits  GLUCOSE, CAPILLARY  COMPREHENSIVE METABOLIC PANEL     IMAGES:  CXR 04/01/17 (found in correspondence 04/06/17,  pg 20, in media tab):  1.  Poor inspiration with only mild right basilar atelectasis.  No definite pneumonia or effusion 2.  Stable borderline cardiomegaly   EKG 04/05/17: NSR. Low voltage QRS.Septal infarct, age undetermined   CV:  Cardiac cath 04/05/17:   The left ventricular systolic function is normal. The left ventricular ejection fraction is 55-65% by visual estimate.  LV end diastolic pressure is mildly elevated.  LM lesion, 30 %stenosed.  Ost LAD lesion, 30 %stenosed. Mid LAD lesion, 45 %stenosed. Dist LAD lesion, 50 %stenosed. - Combined FFR with these lesions was 0.87. Not physiologically significant.  Prox RCA lesion, 30 %stenosed and the remainder of the mid vessel is diffusely diseased but mild. No significant flow-limiting lesions - No flow-limiting lesions takes when the patient several stress test. The 2 lesions in the LAD are angiographically not significant and not significant by FFR.  Either of these 2 lesions could potentially be a focal site for coronary spasm however. - Her stress test would be considered false positive.  Echo 04/02/17 (found in correspondence 04/06/17, pg 21, in media tab):  1.  LV severely dilated.  Borderline concentric LVH.  Overall LV systolic function normal, EF 65-70%.  2.  LA moderately dilated. 3.  Mild aortic valve sclerosis. 4.  Mild tricuspid regurgitation   Past Medical History:  Diagnosis Date  . Arthritis    "pretty much all over" (04/03/2017)  . Asthma   . Autoimmune hepatitis (HCC)   . Family history of adverse reaction to anesthesia    "granddaughter has PONV"  . GERD (gastroesophageal reflux disease)   . High cholesterol   . History of blood transfusion    "in [place]; related to knee surgeries"  . History of kidney stones   . OSA on CPAP   . Type II diabetes mellitus (HCC)     Past Surgical History:  Procedure Laterality Date  . ABDOMINAL HYSTERECTOMY    . BACK SURGERY    . CARDIAC CATHETERIZATION    . CARPAL  TUNNEL RELEASE Bilateral    "done in [place]"  . CHOLECYSTECTOMY    . INCISION AND DRAINAGE Right 2015   S/P knee revision  . INTRAVASCULAR PRESSURE WIRE/FFR STUDY N/A 04/05/2017   Procedure: INTRAVASCULAR PRESSURE WIRE/FFR STUDY;  Surgeon: Marykay Lex, MD;  Location: [facility] INVASIVE CV LAB;  Service: Cardiovascular;  Laterality: N/A;  . JOINT REPLACEMENT    . LEFT HEART CATH AND CORONARY ANGIOGRAPHY N/A 04/05/2017   Procedure: LEFT HEART CATH AND CORONARY ANGIOGRAPHY;  Surgeon: Marykay Lex, MD;  Location: [facility] INVASIVE CV LAB;  Service: Cardiovascular;  Laterality: N/A;  . POSTERIOR FUSION LUMBAR SPINE  05/2010  . REVISION TOTAL KNEE ARTHROPLASTY Right 2015  . SHOULDER ARTHROSCOPY WITH ROTATOR CUFF REPAIR Right   . SHOULDER OPEN ROTATOR CUFF REPAIR Left    "done in [place]"  . TOTAL KNEE ARTHROPLASTY Bilateral 1990s    MEDICATIONS: . lisinopril (PRINIVIL,ZESTRIL) 20 MG tablet  . metoprolol tartrate (LOPRESSOR) 25 MG tablet  . albuterol (PROVENTIL) (2.5 MG/3ML) 0.083% nebulizer solution  . apixaban (ELIQUIS) 5 MG TABS tablet  . aspirin EC 81 MG tablet  . azaTHIOprine (IMURAN) 50 MG tablet  . beclomethasone (QVAR) 80 MCG/ACT inhaler  . Choline Fenofibrate (TRILIPIX) 135 MG capsule  . fesoterodine (TOVIAZ) 8 MG TB24 tablet  . gabapentin (NEURONTIN) 100 MG capsule  . Ipratropium-Albuterol (COMBIVENT RESPIMAT) 20-100 MCG/ACT AERS respimat  . meclizine (ANTIVERT) 25 MG tablet  . meloxicam (MOBIC) 7.5 MG tablet  . metFORMIN (GLUCOPHAGE) 500 MG tablet  . naproxen sodium (ALEVE) 220 MG tablet  . Oxycodone HCl 10 MG TABS  . pantoprazole (PROTONIX) 40 MG tablet  . tiotropium (SPIRIVA) 18 MCG inhalation capsule  . tiZANidine (ZANAFLEX) 2 MG tablet   No current facility-administered medications for this encounter.    - Holding ASA and eliquis 5 days before surgery   If BP acceptable day of surgery, I anticipate pt can proceed with surgery as scheduled.   Rica Mast,  FNP-BC [facility]/Anesthesiology Phone: [phone] 12/05/2017 3:36 PM

## 2017-12-09 MED ORDER — SODIUM CHLORIDE 0.9 % IV SOLN
1000.0000 mg | INTRAVENOUS | Status: AC
  Administered 2017-12-10: 1000 mg via INTRAVENOUS
  Filled 2017-12-09: qty 1100

## 2017-12-10 ENCOUNTER — Encounter (HOSPITAL_COMMUNITY)
Admission: RE | Disposition: A | Discharge status: Skilled Nursing Facility | Payer: Self-pay | Source: Ambulatory Visit | Attending: Orthopaedic Surgery

## 2017-12-10 ENCOUNTER — Inpatient Hospital Stay (HOSPITAL_COMMUNITY): Payer: Medicaid Other

## 2017-12-10 ENCOUNTER — Inpatient Hospital Stay (HOSPITAL_COMMUNITY): Payer: Medicaid Other | Admitting: Emergency Medicine

## 2017-12-10 ENCOUNTER — Inpatient Hospital Stay (HOSPITAL_COMMUNITY)
Admission: RE | Admit: 2017-12-10 | Discharge: 2017-12-13 | DRG: 470 | Disposition: A | Discharge status: Skilled Nursing Facility | Payer: Medicaid Other | Source: Ambulatory Visit | Attending: Orthopaedic Surgery | Admitting: Orthopaedic Surgery

## 2017-12-10 ENCOUNTER — Inpatient Hospital Stay (HOSPITAL_COMMUNITY): Payer: Medicaid Other | Admitting: Certified Registered Nurse Anesthetist

## 2017-12-10 DIAGNOSIS — K219 Gastro-esophageal reflux disease without esophagitis: Secondary | ICD-10-CM | POA: Diagnosis present

## 2017-12-10 DIAGNOSIS — Z9181 History of falling: Secondary | ICD-10-CM | POA: Diagnosis not present

## 2017-12-10 DIAGNOSIS — Z8249 Family history of ischemic heart disease and other diseases of the circulatory system: Secondary | ICD-10-CM

## 2017-12-10 DIAGNOSIS — D62 Acute posthemorrhagic anemia: Secondary | ICD-10-CM | POA: Diagnosis not present

## 2017-12-10 DIAGNOSIS — J45909 Unspecified asthma, uncomplicated: Secondary | ICD-10-CM | POA: Diagnosis present

## 2017-12-10 DIAGNOSIS — Z6841 Body Mass Index (BMI) 40.0 and over, adult: Secondary | ICD-10-CM | POA: Diagnosis not present

## 2017-12-10 DIAGNOSIS — Z9049 Acquired absence of other specified parts of digestive tract: Secondary | ICD-10-CM | POA: Diagnosis not present

## 2017-12-10 DIAGNOSIS — F17210 Nicotine dependence, cigarettes, uncomplicated: Secondary | ICD-10-CM | POA: Diagnosis present

## 2017-12-10 DIAGNOSIS — M2540 Effusion, unspecified joint: Secondary | ICD-10-CM | POA: Diagnosis present

## 2017-12-10 DIAGNOSIS — Z886 Allergy status to analgesic agent status: Secondary | ICD-10-CM

## 2017-12-10 DIAGNOSIS — Z981 Arthrodesis status: Secondary | ICD-10-CM | POA: Diagnosis not present

## 2017-12-10 DIAGNOSIS — K754 Autoimmune hepatitis: Secondary | ICD-10-CM | POA: Diagnosis present

## 2017-12-10 DIAGNOSIS — I25708 Atherosclerosis of coronary artery bypass graft(s), unspecified, with other forms of angina pectoris: Secondary | ICD-10-CM | POA: Diagnosis present

## 2017-12-10 DIAGNOSIS — Z90710 Acquired absence of both cervix and uterus: Secondary | ICD-10-CM

## 2017-12-10 DIAGNOSIS — Z800 Family history of malignant neoplasm of digestive organs: Secondary | ICD-10-CM

## 2017-12-10 DIAGNOSIS — Z96653 Presence of artificial knee joint, bilateral: Secondary | ICD-10-CM | POA: Diagnosis present

## 2017-12-10 DIAGNOSIS — G4733 Obstructive sleep apnea (adult) (pediatric): Secondary | ICD-10-CM | POA: Diagnosis present

## 2017-12-10 DIAGNOSIS — E119 Type 2 diabetes mellitus without complications: Secondary | ICD-10-CM | POA: Diagnosis present

## 2017-12-10 DIAGNOSIS — Z880 Allergy status to penicillin: Secondary | ICD-10-CM | POA: Diagnosis not present

## 2017-12-10 DIAGNOSIS — M1612 Unilateral primary osteoarthritis, left hip: Principal | ICD-10-CM | POA: Diagnosis present

## 2017-12-10 DIAGNOSIS — I10 Essential (primary) hypertension: Secondary | ICD-10-CM | POA: Diagnosis present

## 2017-12-10 DIAGNOSIS — Z96642 Presence of left artificial hip joint: Secondary | ICD-10-CM

## 2017-12-10 DIAGNOSIS — Z833 Family history of diabetes mellitus: Secondary | ICD-10-CM | POA: Diagnosis not present

## 2017-12-10 DIAGNOSIS — Z419 Encounter for procedure for purposes other than remedying health state, unspecified: Secondary | ICD-10-CM

## 2017-12-10 HISTORY — PX: TOTAL HIP ARTHROPLASTY: SHX124

## 2017-12-10 LAB — PROTIME-INR
INR: 1.05
PROTHROMBIN TIME: 13.6 s (ref 11.4–15.2)

## 2017-12-10 LAB — GLUCOSE, CAPILLARY
GLUCOSE-CAPILLARY: 104 mg/dL — AB (ref 65–99)
GLUCOSE-CAPILLARY: 123 mg/dL — AB (ref 65–99)

## 2017-12-10 SURGERY — ARTHROPLASTY, HIP, TOTAL, ANTERIOR APPROACH
Anesthesia: General | Site: Hip | Laterality: Left

## 2017-12-10 MED ORDER — METOCLOPRAMIDE HCL 5 MG/ML IJ SOLN: 5.0000 mg | Freq: Three times a day (TID) | INTRAMUSCULAR | Status: DC | PRN

## 2017-12-10 MED ORDER — CLINDAMYCIN PHOSPHATE 600 MG/50ML IV SOLN
600.0000 mg | Freq: Four times a day (QID) | INTRAVENOUS | Status: AC
  Administered 2017-12-10 – 2017-12-11 (×2): 600 mg via INTRAVENOUS
  Filled 2017-12-10 (×2): qty 50

## 2017-12-10 MED ORDER — HYDROMORPHONE HCL 2 MG/ML IJ SOLN
INTRAMUSCULAR | Status: AC
  Administered 2017-12-10: 0.5 mg via INTRAVENOUS
  Filled 2017-12-10: qty 1

## 2017-12-10 MED ORDER — APIXABAN 5 MG PO TABS
5.0000 mg | ORAL_TABLET | Freq: Two times a day (BID) | ORAL | Status: DC
  Administered 2017-12-11 – 2017-12-13 (×5): 5 mg via ORAL
  Filled 2017-12-10 (×5): qty 1

## 2017-12-10 MED ORDER — GABAPENTIN 100 MG PO CAPS
100.0000 mg | ORAL_CAPSULE | Freq: Three times a day (TID) | ORAL | Status: DC
  Administered 2017-12-10 – 2017-12-13 (×8): 100 mg via ORAL
  Filled 2017-12-10 (×8): qty 1

## 2017-12-10 MED ORDER — MIDAZOLAM HCL 2 MG/2ML IJ SOLN
INTRAMUSCULAR | Status: AC
  Filled 2017-12-10: qty 2

## 2017-12-10 MED ORDER — CHLORHEXIDINE GLUCONATE 4 % EX LIQD: 60.0000 mL | Freq: Once | CUTANEOUS | Status: DC

## 2017-12-10 MED ORDER — METOPROLOL TARTRATE 50 MG PO TABS
50.0000 mg | ORAL_TABLET | Freq: Two times a day (BID) | ORAL | Status: DC
  Administered 2017-12-10 – 2017-12-13 (×6): 50 mg via ORAL
  Filled 2017-12-10 (×6): qty 1

## 2017-12-10 MED ORDER — AZATHIOPRINE 50 MG PO TABS
100.0000 mg | ORAL_TABLET | Freq: Every day | ORAL | Status: DC
  Administered 2017-12-11 – 2017-12-13 (×3): 100 mg via ORAL
  Filled 2017-12-10 (×3): qty 2

## 2017-12-10 MED ORDER — FENTANYL CITRATE (PF) 100 MCG/2ML IJ SOLN
INTRAMUSCULAR | Status: AC
  Filled 2017-12-10: qty 2

## 2017-12-10 MED ORDER — FESOTERODINE FUMARATE ER 8 MG PO TB24
8.0000 mg | ORAL_TABLET | Freq: Every day | ORAL | Status: DC
  Administered 2017-12-11 – 2017-12-13 (×3): 8 mg via ORAL
  Filled 2017-12-10 (×3): qty 1

## 2017-12-10 MED ORDER — PROPOFOL 10 MG/ML IV BOLUS
INTRAVENOUS | Status: AC
  Filled 2017-12-10: qty 20

## 2017-12-10 MED ORDER — CLINDAMYCIN PHOSPHATE 900 MG/50ML IV SOLN
INTRAVENOUS | Status: AC
  Filled 2017-12-10: qty 50

## 2017-12-10 MED ORDER — BUDESONIDE 0.25 MG/2ML IN SUSP
0.2500 mg | Freq: Two times a day (BID) | RESPIRATORY_TRACT | Status: DC
  Administered 2017-12-10 – 2017-12-13 (×6): 0.25 mg via RESPIRATORY_TRACT
  Filled 2017-12-10 (×6): qty 2

## 2017-12-10 MED ORDER — ONDANSETRON HCL 4 MG/2ML IJ SOLN: 4.0000 mg | Freq: Four times a day (QID) | INTRAMUSCULAR | Status: DC | PRN

## 2017-12-10 MED ORDER — METHOCARBAMOL 1000 MG/10ML IJ SOLN
500.0000 mg | Freq: Four times a day (QID) | INTRAVENOUS | Status: DC | PRN
  Filled 2017-12-10: qty 5

## 2017-12-10 MED ORDER — ALBUTEROL SULFATE (2.5 MG/3ML) 0.083% IN NEBU: 2.5000 mg | INHALATION_SOLUTION | Freq: Four times a day (QID) | RESPIRATORY_TRACT | Status: DC | PRN

## 2017-12-10 MED ORDER — FENOFIBRATE 160 MG PO TABS
160.0000 mg | ORAL_TABLET | Freq: Every day | ORAL | Status: DC
  Administered 2017-12-11 – 2017-12-13 (×3): 160 mg via ORAL
  Filled 2017-12-10 (×3): qty 1

## 2017-12-10 MED ORDER — METFORMIN HCL 500 MG PO TABS
500.0000 mg | ORAL_TABLET | Freq: Two times a day (BID) | ORAL | Status: DC
  Administered 2017-12-11 – 2017-12-13 (×5): 500 mg via ORAL
  Filled 2017-12-10 (×5): qty 1

## 2017-12-10 MED ORDER — OXYCODONE HCL 5 MG PO TABS
ORAL_TABLET | ORAL | Status: AC
  Filled 2017-12-10: qty 1

## 2017-12-10 MED ORDER — HYDROMORPHONE HCL 2 MG/ML IJ SOLN
0.5000 mg | INTRAMUSCULAR | Status: DC | PRN
  Administered 2017-12-10 – 2017-12-13 (×7): 1 mg via INTRAVENOUS
  Filled 2017-12-10 (×7): qty 1

## 2017-12-10 MED ORDER — ZOLPIDEM TARTRATE 5 MG PO TABS: 5.0000 mg | ORAL_TABLET | Freq: Every evening | ORAL | Status: DC | PRN

## 2017-12-10 MED ORDER — IPRATROPIUM-ALBUTEROL 0.5-2.5 (3) MG/3ML IN SOLN: 3.0000 mL | Freq: Four times a day (QID) | RESPIRATORY_TRACT | Status: DC | PRN

## 2017-12-10 MED ORDER — ASPIRIN EC 81 MG PO TBEC
81.0000 mg | DELAYED_RELEASE_TABLET | Freq: Every day | ORAL | Status: DC
  Administered 2017-12-11 – 2017-12-13 (×3): 81 mg via ORAL
  Filled 2017-12-10 (×3): qty 1

## 2017-12-10 MED ORDER — HYDROMORPHONE HCL 2 MG/ML IJ SOLN
0.5000 mg | INTRAMUSCULAR | Status: AC | PRN
  Administered 2017-12-10 (×4): 0.5 mg via INTRAVENOUS

## 2017-12-10 MED ORDER — METHOCARBAMOL 500 MG PO TABS
ORAL_TABLET | ORAL | Status: AC
  Filled 2017-12-10: qty 1

## 2017-12-10 MED ORDER — MENTHOL 3 MG MT LOZG: 1.0000 | LOZENGE | OROMUCOSAL | Status: DC | PRN

## 2017-12-10 MED ORDER — TIOTROPIUM BROMIDE MONOHYDRATE 18 MCG IN CAPS
18.0000 ug | ORAL_CAPSULE | Freq: Every day | RESPIRATORY_TRACT | Status: DC
  Administered 2017-12-11 – 2017-12-13 (×3): 18 ug via RESPIRATORY_TRACT
  Filled 2017-12-10: qty 5

## 2017-12-10 MED ORDER — ALUM & MAG HYDROXIDE-SIMETH 200-200-20 MG/5ML PO SUSP: 30.0000 mL | ORAL | Status: DC | PRN

## 2017-12-10 MED ORDER — METHOCARBAMOL 500 MG PO TABS
500.0000 mg | ORAL_TABLET | Freq: Four times a day (QID) | ORAL | Status: DC | PRN
  Administered 2017-12-10 – 2017-12-13 (×6): 500 mg via ORAL
  Filled 2017-12-10 (×5): qty 1

## 2017-12-10 MED ORDER — ONDANSETRON HCL 4 MG PO TABS
4.0000 mg | ORAL_TABLET | Freq: Four times a day (QID) | ORAL | Status: DC | PRN
Start: 2017-12-10 — End: 2017-12-13

## 2017-12-10 MED ORDER — ONDANSETRON HCL 4 MG/2ML IJ SOLN
4.0000 mg | Freq: Four times a day (QID) | INTRAMUSCULAR | Status: DC | PRN
Start: 2017-12-10 — End: 2017-12-13

## 2017-12-10 MED ORDER — DOCUSATE SODIUM 100 MG PO CAPS
100.0000 mg | ORAL_CAPSULE | Freq: Two times a day (BID) | ORAL | Status: DC
  Administered 2017-12-10 – 2017-12-13 (×6): 100 mg via ORAL
  Filled 2017-12-10 (×6): qty 1

## 2017-12-10 MED ORDER — LACTATED RINGERS IV SOLN
INTRAVENOUS | Status: DC
  Administered 2017-12-10 (×2): via INTRAVENOUS

## 2017-12-10 MED ORDER — DIPHENHYDRAMINE HCL 12.5 MG/5ML PO ELIX: 12.5000 mg | ORAL_SOLUTION | ORAL | Status: DC | PRN

## 2017-12-10 MED ORDER — FENTANYL CITRATE (PF) 100 MCG/2ML IJ SOLN
25.0000 ug | INTRAMUSCULAR | Status: DC | PRN
  Administered 2017-12-10: 25 ug via INTRAVENOUS
  Administered 2017-12-10: 50 ug via INTRAVENOUS
  Administered 2017-12-10: 25 ug via INTRAVENOUS
  Administered 2017-12-10: 50 ug via INTRAVENOUS

## 2017-12-10 MED ORDER — ACETAMINOPHEN 325 MG PO TABS: 325.0000 mg | ORAL_TABLET | Freq: Four times a day (QID) | ORAL | Status: DC | PRN

## 2017-12-10 MED ORDER — SUGAMMADEX SODIUM 200 MG/2ML IV SOLN
INTRAVENOUS | Status: DC | PRN
Start: 2017-12-10 — End: 2017-12-10
  Administered 2017-12-10: 500 mg via INTRAVENOUS

## 2017-12-10 MED ORDER — POLYETHYLENE GLYCOL 3350 17 G PO PACK: 17.0000 g | PACK | Freq: Every day | ORAL | Status: DC | PRN

## 2017-12-10 MED ORDER — CLINDAMYCIN PHOSPHATE 900 MG/50ML IV SOLN
900.0000 mg | INTRAVENOUS | Status: AC
  Administered 2017-12-10: 900 mg via INTRAVENOUS

## 2017-12-10 MED ORDER — OXYCODONE HCL 5 MG PO TABS
5.0000 mg | ORAL_TABLET | Freq: Once | ORAL | Status: AC | PRN
  Administered 2017-12-10: 5 mg via ORAL

## 2017-12-10 MED ORDER — MIDAZOLAM HCL 5 MG/5ML IJ SOLN
INTRAMUSCULAR | Status: DC | PRN
  Administered 2017-12-10: 2 mg via INTRAVENOUS

## 2017-12-10 MED ORDER — OXYCODONE HCL 5 MG PO TABS
5.0000 mg | ORAL_TABLET | ORAL | Status: DC | PRN
  Filled 2017-12-10: qty 1
  Filled 2017-12-10 (×2): qty 2
  Filled 2017-12-10: qty 1

## 2017-12-10 MED ORDER — OXYCODONE HCL 5 MG PO TABS
10.0000 mg | ORAL_TABLET | ORAL | Status: DC | PRN
  Administered 2017-12-11 – 2017-12-13 (×7): 15 mg via ORAL
  Filled 2017-12-10 (×5): qty 3

## 2017-12-10 MED ORDER — FENTANYL CITRATE (PF) 250 MCG/5ML IJ SOLN
INTRAMUSCULAR | Status: AC
  Filled 2017-12-10: qty 5

## 2017-12-10 MED ORDER — SODIUM CHLORIDE 0.9 % IR SOLN
Status: DC | PRN
  Administered 2017-12-10: 1000 mL

## 2017-12-10 MED ORDER — PHENYLEPHRINE HCL 10 MG/ML IJ SOLN
INTRAMUSCULAR | Status: DC | PRN
  Administered 2017-12-10 (×2): 80 ug via INTRAVENOUS

## 2017-12-10 MED ORDER — METOCLOPRAMIDE HCL 5 MG PO TABS: 5.0000 mg | ORAL_TABLET | Freq: Three times a day (TID) | ORAL | Status: DC | PRN

## 2017-12-10 MED ORDER — FENTANYL CITRATE (PF) 100 MCG/2ML IJ SOLN
INTRAMUSCULAR | Status: DC | PRN
  Administered 2017-12-10: 50 ug via INTRAVENOUS
  Administered 2017-12-10: 100 ug via INTRAVENOUS
  Administered 2017-12-10 (×5): 50 ug via INTRAVENOUS
  Administered 2017-12-10: 100 ug via INTRAVENOUS

## 2017-12-10 MED ORDER — 0.9 % SODIUM CHLORIDE (POUR BTL) OPTIME
TOPICAL | Status: DC | PRN
  Administered 2017-12-10: 1000 mL

## 2017-12-10 MED ORDER — PHENOL 1.4 % MT LIQD: 1.0000 | OROMUCOSAL | Status: DC | PRN

## 2017-12-10 MED ORDER — ROCURONIUM BROMIDE 100 MG/10ML IV SOLN
INTRAVENOUS | Status: DC | PRN
  Administered 2017-12-10: 50 mg via INTRAVENOUS

## 2017-12-10 MED ORDER — PROPOFOL 10 MG/ML IV BOLUS
INTRAVENOUS | Status: DC | PRN
  Administered 2017-12-10: 150 mg via INTRAVENOUS

## 2017-12-10 MED ORDER — OXYCODONE HCL 5 MG/5ML PO SOLN: 5.0000 mg | Freq: Once | ORAL | Status: AC | PRN

## 2017-12-10 MED ORDER — PANTOPRAZOLE SODIUM 40 MG PO TBEC
40.0000 mg | DELAYED_RELEASE_TABLET | Freq: Every day | ORAL | Status: DC
  Administered 2017-12-11 – 2017-12-13 (×3): 40 mg via ORAL
  Filled 2017-12-10 (×3): qty 1

## 2017-12-10 MED ORDER — LIDOCAINE HCL (CARDIAC) PF 100 MG/5ML IV SOSY
PREFILLED_SYRINGE | INTRAVENOUS | Status: DC | PRN
  Administered 2017-12-10: 80 mg via INTRAVENOUS

## 2017-12-10 MED ORDER — DEXAMETHASONE SODIUM PHOSPHATE 4 MG/ML IJ SOLN
INTRAMUSCULAR | Status: DC | PRN
  Administered 2017-12-10: 10 mg via INTRAVENOUS

## 2017-12-10 MED ORDER — SODIUM CHLORIDE 0.9 % IV SOLN: INTRAVENOUS | Status: DC

## 2017-12-10 SURGICAL SUPPLY — 53 items
BENZOIN TINCTURE PRP APPL 2/3 (GAUZE/BANDAGES/DRESSINGS) ×3 IMPLANT
BLADE CLIPPER SURG (BLADE) IMPLANT
BLADE SAW SGTL 18X1.27X75 (BLADE) ×2 IMPLANT
BLADE SAW SGTL 18X1.27X75MM (BLADE) ×1
CAPT HIP TOTAL 2 ×3 IMPLANT
CELLS DAT CNTRL 66122 CELL SVR (MISCELLANEOUS) ×1 IMPLANT
CLOSURE WOUND 1/2 X4 (GAUZE/BANDAGES/DRESSINGS) ×2
COVER SURGICAL LIGHT HANDLE (MISCELLANEOUS) ×3 IMPLANT
DRAPE C-ARM 42X72 X-RAY (DRAPES) ×3 IMPLANT
DRAPE STERI IOBAN 125X83 (DRAPES) ×3 IMPLANT
DRAPE U-SHAPE 47X51 STRL (DRAPES) ×9 IMPLANT
DRSG AQUACEL AG ADV 3.5X10 (GAUZE/BANDAGES/DRESSINGS) ×3 IMPLANT
DURAPREP 26ML APPLICATOR (WOUND CARE) ×3 IMPLANT
ELECT BLADE 4.0 EZ CLEAN MEGAD (MISCELLANEOUS) ×3
ELECT BLADE 6.5 EXT (BLADE) ×3 IMPLANT
ELECT REM PT RETURN 9FT ADLT (ELECTROSURGICAL) ×3
ELECTRODE BLDE 4.0 EZ CLN MEGD (MISCELLANEOUS) ×1 IMPLANT
ELECTRODE REM PT RTRN 9FT ADLT (ELECTROSURGICAL) ×1 IMPLANT
FACESHIELD WRAPAROUND (MASK) ×6 IMPLANT
GAUZE XEROFORM 5X9 LF (GAUZE/BANDAGES/DRESSINGS) ×3 IMPLANT
GLOVE BIOGEL PI IND STRL 8 (GLOVE) ×2 IMPLANT
GLOVE BIOGEL PI INDICATOR 8 (GLOVE) ×4
GLOVE ECLIPSE 8.0 STRL XLNG CF (GLOVE) ×3 IMPLANT
GLOVE ORTHO TXT STRL SZ7.5 (GLOVE) ×6 IMPLANT
GLOVE SURG SS PI 7.0 STRL IVOR (GLOVE) ×3 IMPLANT
GOWN STRL REUS W/ TWL LRG LVL3 (GOWN DISPOSABLE) ×2 IMPLANT
GOWN STRL REUS W/ TWL XL LVL3 (GOWN DISPOSABLE) ×2 IMPLANT
GOWN STRL REUS W/TWL LRG LVL3 (GOWN DISPOSABLE) ×4
GOWN STRL REUS W/TWL XL LVL3 (GOWN DISPOSABLE) ×4
HANDPIECE INTERPULSE COAX TIP (DISPOSABLE) ×2
KIT BASIN OR (CUSTOM PROCEDURE TRAY) ×3 IMPLANT
KIT TURNOVER KIT B (KITS) ×3 IMPLANT
MANIFOLD NEPTUNE II (INSTRUMENTS) ×3 IMPLANT
NS IRRIG 1000ML POUR BTL (IV SOLUTION) ×3 IMPLANT
PACK TOTAL JOINT (CUSTOM PROCEDURE TRAY) ×3 IMPLANT
PAD ARMBOARD 7.5X6 YLW CONV (MISCELLANEOUS) ×9 IMPLANT
RTRCTR WOUND ALEXIS 18CM MED (MISCELLANEOUS) ×3
SET HNDPC FAN SPRY TIP SCT (DISPOSABLE) ×1 IMPLANT
STAPLER VISISTAT 35W (STAPLE) IMPLANT
STRIP CLOSURE SKIN 1/2X4 (GAUZE/BANDAGES/DRESSINGS) ×4 IMPLANT
SUT ETHIBOND NAB CT1 #1 30IN (SUTURE) ×3 IMPLANT
SUT MNCRL AB 4-0 PS2 18 (SUTURE) IMPLANT
SUT VIC AB 0 CT1 27 (SUTURE) ×2
SUT VIC AB 0 CT1 27XBRD ANBCTR (SUTURE) ×1 IMPLANT
SUT VIC AB 1 CT1 27 (SUTURE) ×2
SUT VIC AB 1 CT1 27XBRD ANBCTR (SUTURE) ×1 IMPLANT
SUT VIC AB 2-0 CT1 27 (SUTURE) ×2
SUT VIC AB 2-0 CT1 TAPERPNT 27 (SUTURE) ×1 IMPLANT
TOWEL OR 17X24 6PK STRL BLUE (TOWEL DISPOSABLE) ×3 IMPLANT
TOWEL OR 17X26 10 PK STRL BLUE (TOWEL DISPOSABLE) ×3 IMPLANT
TRAY CATH 16FR W/PLASTIC CATH (SET/KITS/TRAYS/PACK) IMPLANT
TRAY FOLEY MTR SLVR 16FR STAT (SET/KITS/TRAYS/PACK) IMPLANT
WATER STERILE IRR 1000ML POUR (IV SOLUTION) ×6 IMPLANT

## 2017-12-10 NOTE — Anesthesia Preprocedure Evaluation (Signed)
Anesthesia Evaluation  Patient identified by MRN, date of birth, ID band Patient awake    Reviewed: Allergy & Precautions, H&P , NPO status , Patient's Chart, lab work & pertinent test results  Airway Mallampati: II   Neck ROM: full    Dental   Pulmonary asthma , sleep apnea , Current Smoker,    breath sounds clear to auscultation       Cardiovascular hypertension, + angina + CAD   Rhythm:regular Rate:Normal     Neuro/Psych    GI/Hepatic GERD  ,(+) Hepatitis -  Endo/Other  diabetes, Type obesity  Renal/GU      Musculoskeletal  (+) Arthritis ,   Abdominal   Peds  Hematology   Anesthesia Other Findings   Reproductive/Obstetrics                             Anesthesia Physical Anesthesia Plan  ASA: III  Anesthesia Plan: General   Post-op Pain Management:    Induction: Intravenous  PONV Risk Score and Plan: 2 and Ondansetron, Dexamethasone and Treatment may vary due to age or medical condition  Airway Management Planned: Oral ETT  Additional Equipment:   Intra-op Plan:   Post-operative Plan: Extubation in OR  Informed Consent: I have reviewed the patients History and Physical, chart, labs and discussed the procedure including the risks, benefits and alternatives for the proposed anesthesia with the patient or authorized representative who has indicated his/her understanding and acceptance.     Plan Discussed with: Anesthesiologist, CRNA and Surgeon  Anesthesia Plan Comments:         Anesthesia Quick Evaluation

## 2017-12-10 NOTE — Brief Op Note (Signed)
12/10/2017  1:51 PM  PATIENT:  Nancy Simmons  62 y.o. female  PRE-OPERATIVE DIAGNOSIS:  osteoarthritis left hip  POST-OPERATIVE DIAGNOSIS:  osteoarthritis left hip  PROCEDURE:  Procedure(s): LEFT TOTAL HIP ARTHROPLASTY ANTERIOR APPROACH (Left)  SURGEON:  Surgeon(s) and Role:    Kathryne Hitch, MD - Primary  PHYSICIAN ASSISTANT: Rexene Edison, PA-C  ANESTHESIA:   general  EBL:  200 mL   COUNTS:  YES  DICTATION: .Other Dictation: Dictation Number [id number]  PLAN OF CARE: Admit to inpatient   PATIENT DISPOSITION:  PACU - hemodynamically stable.   Delay start of Pharmacological VTE agent (>24hrs) due to surgical blood loss or risk of bleeding: no

## 2017-12-10 NOTE — Anesthesia Procedure Notes (Signed)
Procedure Name: Intubation Date/Time: 12/10/2017 12:23 PM Performed by: Willma Obando T, CRNA Pre-anesthesia Checklist: Patient identified, Emergency Drugs available, Suction available and Patient being monitored Patient Re-evaluated:Patient Re-evaluated prior to induction Oxygen Delivery Method: Circle system utilized Induction Type: IV induction Ventilation: Mask ventilation with difficulty, Oral airway inserted - appropriate to patient size and Two handed mask ventilation required Laryngoscope Size: Miller and 2 Grade View: Grade II Tube type: Oral Tube size: 7.5 mm Number of attempts: 1 Airway Equipment and Method: Patient positioned with wedge pillow and Stylet Placement Confirmation: ETT inserted through vocal cords under direct vision,  positive ETCO2 and breath sounds checked- equal and bilateral Secured at: 21 cm Tube secured with: Tape Dental Injury: Teeth and Oropharynx as per pre-operative assessment

## 2017-12-10 NOTE — Transfer of Care (Signed)
Immediate Anesthesia Transfer of Care Note  Patient: Nancy Simmons  Procedure(s) Performed: LEFT TOTAL HIP ARTHROPLASTY ANTERIOR APPROACH (Left Hip)  Patient Location: PACU  Anesthesia Type:General  Level of Consciousness: awake, alert  and oriented  Airway & Oxygen Therapy: Patient Spontanous Breathing and Patient connected to nasal cannula oxygen  Post-op Assessment: Report given to RN, Post -op Vital signs reviewed and stable and Patient moving all extremities  Post vital signs: Reviewed and stable  Last Vitals:  Vitals Value Taken Time  BP 160/68 12/10/2017  2:29 PM  Temp    Pulse 81 12/10/2017  2:34 PM  Resp 18 12/10/2017  2:34 PM  SpO2 97 % 12/10/2017  2:34 PM  Vitals shown include unvalidated device data.  Last Pain:  Vitals:   12/10/17 1055  TempSrc:   PainSc: 10-Worst pain ever      Patients Stated Pain Goal: 3 (12/10/17 1055)  Complications: No apparent anesthesia complications

## 2017-12-10 NOTE — Op Note (Signed)
NAMELULA, KOLTON MEDICAL RECORD ZO:[id number] ACCOUNT [id number] DATE OF BIRTH:Aug 04, 1955 FACILITY[facility] LOCATION: [facility] PHYSICIAN:Mattox Schorr Aretha Parrot, MD  OPERATIVE REPORT  DATE OF PROCEDURE:  12/10/2017  PREOPERATIVE DIAGNOSIS:  Primary osteoarthritis and degenerative joint disease, left hip.  POSTOPERATIVE DIAGNOSIS:  Primary osteoarthritis and degenerative joint disease, left hip.  PROCEDURE PERFORMED:  Left total hip arthroplasty through direct anterior approach.  IMPLANTS:  DePuy Sector Gription acetabular component size 50, size 32+0 polyethylene liner, size 10 Corail femoral component with standard offset, size 32+9 ceramic hip ball.  SURGEON:  Vanita Panda. Magnus Ivan, MD  ASSISTANT:  Rexene Edison, PA-C   ANESTHESIA:  General.  ANTIBIOTICS:  900 mg IV clindamycin.  ESTIMATED BLOOD LOSS:  200 mL.  COMPLICATIONS:  None.  INDICATIONS:  The patient is a pleasant 62 year old I have known for a long time.  She is morbidly obese, weighing almost 300 pounds.  She has debilitating arthritis of her left hip.  This has been well documented for many years now.  Her femoral head is  almost essentially melting away at this point.  She does have a history of bilateral knee replacements as well, and her right hip is normal, but her left hip pain is severe and it is daily.  It detrimentally impacts her activities of daily living, her  quality of life and mobility.  She has tried weight loss, and she has worked on activity modification as well as anti-inflammatories and steroid injections in the hip.  At this point, she does wish to proceed with a total hip arthroplasty with very  conservative treatment measures.  She understands fully the risk of acute blood loss anemia, nerve and vessel injury, fracture, infection, dislocation, DVT as well as implant failure with all these risks being heightened given her morbid obesity.  She is  also diabetic but has good blood glucose control  with a hemoglobin A1c last checked at 5.7.  DESCRIPTION OF PROCEDURE:  After informed consent was obtained and the appropriate left hip was marked.  She was brought to the operating room where general anesthesia was obtained while she was on a stretcher.  A Foley catheter was placed, and traction  boots were placed on both her feet.  Next, she was placed supine on the Hana fracture table with a perineal post in place and both legs in an in-line skeletal traction device and no traction applied.  Her left operative hip was prepped and draped with  DuraPrep and sterile drapes.  A time-out was called, and she was identified as the correct patient and correct left hip.  I then made an incision just inferior and posterior to the anterior superior iliac spine and carried this obliquely down the leg.   We dissected down the tensor fascia lata muscle.  Tensor fascia was then divided longitudinally to proceed with an anterior approach to the hip.  We identified and cauterized the circumflex vessels and identified the hip capsule and opened up the hip  capsule in an L-type format, finding a moderate joint effusion.  I placed a Corail retractor on the medial lateral femoral neck and then made a femoral neck cut with an oscillating saw just proximal to the lesser trochanter and completed this with an  osteotome.  We placed a corkscrew guide in the femoral head and found a very small femoral head.  It was completely devoid of cartilage.  We then placed a bent Hohmann over the medial acetabular rim and removed remnants of the acetabular labrum and  other  debris as well as periarticular osteophytes.  We then began reaming under direct visualization from a size 44 reamer and going in stepwise increments up to a size 49 with all removed under direct visualization, the last reamer under direct fluoroscopy  so we could obtain our depth in reaming, inclination and anteversion.  With that being said, we then placed a real DePuy  Sector Gription acetabular component size 50 and a single screw.  We placed a 32+0 polyethylene liner for that size acetabular  component.  Attention was then turned to the femur with the leg externally rotated to 120 degrees, extended and adducted, we were able to place a Mueller retractor medially and a Hohmann retractor by the greater trochanter.  We released the joint capsule  and used a box-cutting osteotome in the femoral canal and a rongeur to lateralize.  We then began broaching from a size 8 broach using the Corail broaching system and going up to only a size 10 based on her thick bone and the quality of the bone.  With  a size 10 in place, we trialed a standard offset femoral neck and a 32+1 hip ball, reduced this in the acetabulum, and I felt like we needed definitely more offset leg length to improve her stability, although she had good range of motion.  We dislocated  the hip, removed the trial components.  I placed the real Corail femoral component with standard offset size 10 and the real 32+9 ceramic hip ball and reduced this in the acetabulum.  I was very pleased with the stability, leg length, offset and range  of motion.  We then irrigated the soft tissues with normal saline solution using pulsatile lavage.  I was able to close the ____ incision of the joint capsule with interrupted #1 Ethibond suture, followed by running 0 Vicryl and tensor fascia, 0 Vicryl  in the deep tissue, 2-0 Vicryl subcutaneous tissue with interrupted 2-0 nylon in the skin.  Xeroform and an Aquacel dressing were applied.  She was awakened, extubated, and taken to recovery room in stable condition.  All final counts were correct.   There were no complications noted.  Of note, Rexene Edison, PA-C, assisted in the entire case.  His assistance was crucial for facilitating all aspects of this case.  LN/NUANCE  D:12/10/2017 T:12/10/2017 JOB:[id number]

## 2017-12-10 NOTE — H&P (Signed)
TOTAL HIP ADMISSION H&P  Patient is admitted for left total hip arthroplasty.  Subjective:  Chief Complaint: left hip pain  HPI: Nancy Simmons, 62 y.o. female, has a history of pain and functional disability in the left hip(s) due to arthritis and patient has failed non-surgical conservative treatments for greater than 12 weeks to include NSAID's and/or analgesics, corticosteriod injections, viscosupplementation injections, flexibility and strengthening excercises, use of assistive devices, weight reduction as appropriate and activity modification.  Onset of symptoms was gradual starting 3 years ago with gradually worsening course since that time.The patient noted no past surgery on the left hip(s).  Patient currently rates pain in the left hip at 10 out of 10 with activity. Patient has night pain, worsening of pain with activity and weight bearing, trendelenberg gait, pain that interfers with activities of daily living and pain with passive range of motion. Patient has evidence of subchondral cysts, subchondral sclerosis, periarticular osteophytes and joint space narrowing by imaging studies. This condition presents safety issues increasing the risk of falls.  There is no current active infection.  Patient Active Problem List   Diagnosis Date Noted  . Unilateral primary osteoarthritis, left hip 11/04/2017  . Abnormal stress echocardiography   . Stable angina (HCC) 04/03/2017  . DM2 (diabetes mellitus, type 2) (HCC) 04/03/2017  . Asthma 04/03/2017  . OSA on CPAP 04/03/2017  . Angina at rest [facility]) 04/03/2017  . Medication monitoring encounter 07/09/2016  . Autoimmune hepatitis (HCC) 07/09/2016  . 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 HTN (hypertension) 09/27/2015  . CAD (coronary artery disease) of artery bypass graft 09/27/2015  . Hypercholesterolemia 09/27/2015  . S/P cholecystectomy 09/27/2015   Past Medical History:  Diagnosis Date  . Arthritis    "pretty much  all over" (04/03/2017)  . Asthma   . Autoimmune hepatitis (HCC)   . Coronary artery disease    mild-mod by 2018 cath  . Family history of adverse reaction to anesthesia    "granddaughter has PONV"  . GERD (gastroesophageal reflux disease)   . High cholesterol   . History of blood transfusion    "in [place]; related to knee surgeries"  . History of kidney stones   . OSA on CPAP   . Type II diabetes mellitus (HCC)     Past Surgical History:  Procedure Laterality Date  . ABDOMINAL HYSTERECTOMY    . BACK SURGERY    . CARDIAC CATHETERIZATION    . CARPAL TUNNEL RELEASE Bilateral    "done in [place]"  . CHOLECYSTECTOMY    . INCISION AND DRAINAGE Right 2015   S/P knee revision  . INTRAVASCULAR PRESSURE WIRE/FFR STUDY N/A 04/05/2017   Procedure: INTRAVASCULAR PRESSURE WIRE/FFR STUDY;  Surgeon: Marykay Lex, MD;  Location: [facility] INVASIVE CV LAB;  Service: Cardiovascular;  Laterality: N/A;  . JOINT REPLACEMENT    . LEFT HEART CATH AND CORONARY ANGIOGRAPHY N/A 04/05/2017   Procedure: LEFT HEART CATH AND CORONARY ANGIOGRAPHY;  Surgeon: Marykay Lex, MD;  Location: [facility] INVASIVE CV LAB;  Service: Cardiovascular;  Laterality: N/A;  . POSTERIOR FUSION LUMBAR SPINE  05/2010  . REVISION TOTAL KNEE ARTHROPLASTY Right 2015  . SHOULDER ARTHROSCOPY WITH ROTATOR CUFF REPAIR Right   . SHOULDER OPEN ROTATOR CUFF REPAIR Left    "done in [place]"  . TOTAL KNEE ARTHROPLASTY Bilateral 1990s    Current Facility-Administered Medications  Medication Dose Route Frequency Provider Last Rate Last Dose  . chlorhexidine (HIBICLENS) 4 % liquid 4 application  60  mL Topical Once Richardean Canal W, PA-C      . clindamycin (CLEOCIN) 900 MG/50ML IVPB           . clindamycin (CLEOCIN) IVPB 900 mg  900 mg Intravenous On Call to OR Kirtland Bouchard, PA-C      . lactated ringers infusion   Intravenous Continuous Ellender, Catheryn Bacon, MD      . tranexamic acid (CYKLOKAPRON) 1,000 mg in sodium chloride 0.9 % 100 mL IVPB   1,000 mg Intravenous To OR Kathryne Hitch, MD       Allergies  Allergen Reactions  . Tylenol [Acetaminophen] Other (See Comments)    Due to Autoimmune hepatitis  . Penicillin G Rash    Has patient had a PCN reaction causing immediate rash, facial/tongue/throat swelling, SOB or lightheadedness with hypotension: No Has patient had a PCN reaction causing severe rash involving mucus membranes or skin necrosis: No Has patient had a PCN reaction that required hospitalization: No Has patient had a PCN reaction occurring within the last 10 years: Yes If all of the above answers are "NO", then may proceed with Cephalosporin use.     Social History   Tobacco Use  . Smoking status: Current Every Day Smoker    Packs/day: 0.50    Years: 44.00    Pack years: 22.00    Types: Cigarettes    Start date: 07/23/1972  . Smokeless tobacco: Never Used  Substance Use Topics  . Alcohol use: No    Alcohol/week: 0.0 oz    Family History  Problem Relation Age of Onset  . CAD Mother   . CAD Father   . Diabetes Father   . CAD Brother   . Diabetes Brother   . Colon cancer Other   . Stroke Other      Review of Systems  Musculoskeletal: Positive for back pain and joint pain.  All other systems reviewed and are negative.   Objective:  Physical Exam  Constitutional: She is oriented to person, place, and time. She appears well-developed and well-nourished.  HENT:  Head: Normocephalic and atraumatic.  Eyes: Pupils are equal, round, and reactive to light. EOM are normal.  Neck: Normal range of motion. Neck supple.  Cardiovascular: Normal rate and regular rhythm.  Respiratory: Effort normal and breath sounds normal.  GI: Soft. Bowel sounds are normal.  Musculoskeletal:       Left hip: She exhibits decreased range of motion, decreased strength, tenderness and bony tenderness.  Neurological: She is alert and oriented to person, place, and time.  Skin: Skin is warm and dry.  Psychiatric: She  has a normal mood and affect.    Vital signs in last 24 hours: Temp:  [98.3 F (36.8 C)] 98.3 F (36.8 C) (05/21 1023) Pulse Rate:  [63] 63 (05/21 1023) Resp:  [18] 18 (05/21 1023) BP: (136)/(69) 136/69 (05/21 1023) SpO2:  [99 %] 99 % (05/21 1023)  Labs:   Estimated body mass index is 51.87 kg/m as calculated from the following:   Height as of 12/02/17:  (1.575 m).   Weight as of 12/02/17: 283 lb 9.6 oz (128.6 kg).   Imaging Review Plain radiographs demonstrate severe degenerative joint disease of the left hip(s). The bone quality appears to be good for age and reported activity level.    Preoperative templating of the joint replacement has been completed, documented, and submitted to the Operating Room personnel in order to optimize intra-operative equipment management.     Assessment/Plan:  End stage arthritis, left hip(s)  The patient history, physical examination, clinical judgement of the provider and imaging studies are consistent with end stage degenerative joint disease of the left hip(s) and total hip arthroplasty is deemed medically necessary. The treatment options including medical management, injection therapy, arthroscopy and arthroplasty were discussed at length. The risks and benefits of total hip arthroplasty were presented and reviewed. The risks due to aseptic loosening, infection, stiffness, dislocation/subluxation,  thromboembolic complications and other imponderables were discussed.  The patient acknowledged the explanation, agreed to proceed with the plan and consent was signed. Patient is being admitted for inpatient treatment for surgery, pain control, PT, OT, prophylactic antibiotics, VTE prophylaxis, progressive ambulation and ADL's and discharge planning.The patient is planning to be discharged home with home health services

## 2017-12-11 ENCOUNTER — Encounter (HOSPITAL_COMMUNITY): Payer: Self-pay | Admitting: Orthopaedic Surgery

## 2017-12-11 LAB — CBC
HCT: 34.1 % — ABNORMAL LOW (ref 36.0–46.0)
Hemoglobin: 10.4 g/dL — ABNORMAL LOW (ref 12.0–15.0)
MCH: 29.2 pg (ref 26.0–34.0)
MCHC: 30.5 g/dL (ref 30.0–36.0)
MCV: 95.8 fL (ref 78.0–100.0)
PLATELETS: 279 10*3/uL (ref 150–400)
RBC: 3.56 MIL/uL — ABNORMAL LOW (ref 3.87–5.11)
RDW: 14.7 % (ref 11.5–15.5)
WBC: 6.9 10*3/uL (ref 4.0–10.5)

## 2017-12-11 LAB — BASIC METABOLIC PANEL
Anion gap: 10 (ref 5–15)
BUN: 11 mg/dL (ref 6–20)
CALCIUM: 9.5 mg/dL (ref 8.9–10.3)
CO2: 22 mmol/L (ref 22–32)
CREATININE: 0.63 mg/dL (ref 0.44–1.00)
Chloride: 106 mmol/L (ref 101–111)
GFR calc Af Amer: >60 mL/min (ref >60)
GLUCOSE: 135 mg/dL — AB (ref 65–99)
Potassium: 4.4 mmol/L (ref 3.5–5.1)
Sodium: 138 mmol/L (ref 135–145)

## 2017-12-11 NOTE — Evaluation (Signed)
Physical Therapy Evaluation Patient Details Name: Nancy Simmons MRN: [id number] DOB: 1956/02/11 Today's Date: 12/11/2017   History of Present Illness  Patient is a 62 y.o. F with significant PMH of diabetes mellitus type 2, OSA on CPAP, stable angina, HTN, CAD, vertigo, and bilateral TKA's. S/p left total hip arthroplasty - direct anterior approach.  Clinical Impression  Pt is s/p THA resulting in the deficits listed below (see PT Problem List). Patient was a limited household ambulator and required assistance for ADL's prior to admission. At PT evaluation, patient presenting with decreased functional mobility secondary to diminished LLE strength, pain, and activity tolerance. Ambulated 10 feet with RW and abnormal gait. Do not feel patient is ready to negotiate 5 steps to enter/exit home. Highly recommending SNF to maximize functional independence. Pt will benefit from skilled PT to increase their independence and safety with mobility.      Follow Up Recommendations Follow surgeon's recommendation for DC plan and follow-up therapies    Equipment Recommendations  None recommended by PT    Recommendations for Other Services       Precautions / Restrictions Precautions Precautions: Fall Restrictions Weight Bearing Restrictions: Yes LLE Weight Bearing: Weight bearing as tolerated      Mobility  Bed Mobility Overal bed mobility: Needs Assistance Bed Mobility: Supine to Sit     Supine to sit: Min assist     General bed mobility comments: min assist for LLE management. Heavy use of bed rail (patient has hospital bed at home)  Transfers Overall transfer level: Needs assistance Equipment used: Rolling walker (2 wheeled) Transfers: Sit to/from Stand Sit to Stand: Min guard         General transfer comment: min guard to transition from sit to stand from elevated bed height. VC's for extending left leg prior to sitting for pain control  Ambulation/Gait Ambulation/Gait  assistance: Min guard Ambulation Distance (Feet): 10 Feet Assistive device: Rolling walker (2 wheeled) Gait Pattern/deviations: Step-to pattern;Decreased step length - left;Trunk flexed   Gait velocity interpretation: <1.31 ft/sec, indicative of household ambulator General Gait Details: Mod VC's for upright posture and RW proximity but patient unable to maintain. Max VC's for sequencing  Stairs            Wheelchair Mobility    Modified Rankin (Stroke Patients Only)       Balance Overall balance assessment: Needs assistance Sitting-balance support: Bilateral upper extremity supported;Feet supported Sitting balance-Leahy Scale: Good     Standing balance support: Bilateral upper extremity supported Standing balance-Leahy Scale: Poor Standing balance comment: reliant on external support                             Pertinent Vitals/Pain Pain Assessment: 0-10 Pain Score: 10-Worst pain ever Pain Location: L hip with ambulation Pain Descriptors / Indicators: Guarding;Grimacing Pain Intervention(s): Limited activity within patient's tolerance;Monitored during session;Patient requesting pain meds-RN notified    Home Living Family/patient expects to be discharged to:: Private residence Living Arrangements: Alone Available Help at Discharge: Family;Available PRN/intermittently Type of Home: Apartment Home Access: Stairs to enter Entrance Stairs-Rails: Can reach both Entrance Stairs-Number of Steps: 5 Home Layout: One level Home Equipment: Emergency planning/management officer - 2 wheels;Cane - single point;Hospital bed      Prior Function Level of Independence: Needs assistance   Gait / Transfers Assistance Needed: uses RW for mobility  ADL's / Homemaking Assistance Needed: Home health aide assists with ADL's/IADL's  Comments: Home health aide daily  for 2.5 hours.      Hand Dominance        Extremity/Trunk Assessment   Upper Extremity Assessment Upper Extremity  Assessment: Defer to OT evaluation    Lower Extremity Assessment Lower Extremity Assessment: RLE deficits/detail;LLE deficits/detail RLE Deficits / Details: WFL LLE Deficits / Details: s/p L THA. Grossly weak, not formally assessed due to pain. Ankle dorsiflexion/plantarflexion WFL and able to perform quad set. Unable to perform SLR or hip adduction/abduction    Cervical / Trunk Assessment Cervical / Trunk Assessment: Other exceptions Cervical / Trunk Exceptions: rounded shoulders  Communication   Communication: No difficulties  Cognition Arousal/Alertness: Awake/alert Behavior During Therapy: WFL for tasks assessed/performed Overall Cognitive Status: Within Functional Limits for tasks assessed                                        General Comments General comments (skin integrity, edema, etc.): Surgical site dressing C/D/I. Written HEP provided    Exercises Total Joint Exercises Ankle Circles/Pumps: Both;Seated;10 reps Quad Sets: 5 reps;Both;Seated   Assessment/Plan    PT Assessment Patient needs continued PT services  PT Problem List Decreased strength;Decreased range of motion;Decreased activity tolerance;Decreased balance;Decreased mobility;Decreased knowledge of use of DME;Obesity;Pain       PT Treatment Interventions DME instruction;Gait training;Stair training;Functional mobility training;Therapeutic activities;Therapeutic exercise;Balance training;Neuromuscular re-education;Patient/family education    PT Goals (Current goals can be found in the Care Plan section)  Acute Rehab PT Goals Patient Stated Goal: go to rehab PT Goal Formulation: With patient Time For Goal Achievement: 12/16/17 Potential to Achieve Goals: Fair    Frequency 7X/week   Barriers to discharge Inaccessible home environment;Decreased caregiver support      Co-evaluation               AM-PAC PT "6 Clicks" Daily Activity  Outcome Measure Difficulty turning over in  bed (including adjusting bedclothes, sheets and blankets)?: Unable Difficulty moving from lying on back to sitting on the side of the bed? : Unable Difficulty sitting down on and standing up from a chair with arms (e.g., wheelchair, bedside commode, etc,.)?: A Little Help needed moving to and from a bed to chair (including a wheelchair)?: A Little Help needed walking in hospital room?: A Little Help needed climbing 3-5 steps with a railing? : A Lot 6 Click Score: 13    End of Session Equipment Utilized During Treatment: Gait belt Activity Tolerance: Patient limited by pain Patient left: in chair;with call bell/phone within reach Nurse Communication: Mobility status PT Visit Diagnosis: Unsteadiness on feet (R26.81);Muscle weakness (generalized) (M62.81);Difficulty in walking, not elsewhere classified (R26.2);Pain Pain - Right/Left: Left Pain - part of body: Hip    Time: 0821-0854 PT Time Calculation (min) (ACUTE ONLY): 33 min   Charges:   PT Evaluation $PT Eval Moderate Complexity: 1 Mod PT Treatments $Gait Training: 8-22 mins   PT G Codes:        Laurina Bustle, PT, DPT Acute Rehabilitation Services  Pager: [phone]   Vanetta Mulders 12/11/2017, 9:09 AM

## 2017-12-11 NOTE — Progress Notes (Signed)
Subjective: 1 Day Post-Op Procedure(s) (LRB): LEFT TOTAL HIP ARTHROPLASTY ANTERIOR APPROACH (Left) Patient reports pain as moderate.  Minimal acute blood loss anemia from surgery - tolerating well.  Objective: Vital signs in last 24 hours: Temp:  [97.7 F (36.5 C)-98.9 F (37.2 C)] 98 F (36.7 C) (05/22 0500) Pulse Rate:  [63-86] 76 (05/22 0500) Resp:  [12-32] 16 (05/22 0500) BP: (136-176)/(65-85) 146/65 (05/22 0500) SpO2:  [92 %-100 %] 98 % (05/22 0500)  Intake/Output from previous day: 05/21 0701 - 05/22 0700 In: 2620 [P.O.:120; I.V.:2500] Out: 2400 [Urine:2150; Blood:250] Intake/Output this shift: No intake/output data recorded.  Recent Labs    12/11/17 0345  HGB 10.4*   Recent Labs    12/11/17 0345  WBC 6.9  RBC 3.56*  HCT 34.1*  PLT 279   Recent Labs    12/11/17 0345  NA 138  K 4.4  CL 106  CO2 22  BUN 11  CREATININE 0.63  GLUCOSE 135*  CALCIUM 9.5   Recent Labs    12/10/17 1105  INR 1.05    Sensation intact distally Intact pulses distally Dorsiflexion/Plantar flexion intact Incision: dressing C/D/I  Assessment/Plan: 1 Day Post-Op Procedure(s) (LRB): LEFT TOTAL HIP ARTHROPLASTY ANTERIOR APPROACH (Left) Up with therapy    Kathryne Hitch 12/11/2017, 7:20 AM

## 2017-12-11 NOTE — Anesthesia Postprocedure Evaluation (Signed)
Anesthesia Post Note  Patient: Nancy Simmons  Procedure(s) Performed: LEFT TOTAL HIP ARTHROPLASTY ANTERIOR APPROACH (Left Hip)     Patient location during evaluation: PACU Anesthesia Type: General Level of consciousness: awake and alert Pain management: pain level controlled Vital Signs Assessment: post-procedure vital signs reviewed and stable Respiratory status: spontaneous breathing, nonlabored ventilation, respiratory function stable and patient connected to nasal cannula oxygen Cardiovascular status: blood pressure returned to baseline and stable Postop Assessment: no apparent nausea or vomiting Anesthetic complications: no    Last Vitals:  Vitals:   12/11/17 1952 12/11/17 2028  BP: (!) 152/59   Pulse: 83   Resp: 18   Temp: 37.1 C   SpO2: 97% 96%    Last Pain:  Vitals:   12/11/17 1952  TempSrc: Oral  PainSc:    Pain Goal: Patients Stated Pain Goal: 0 (12/11/17 1949)               Merilynn Haydu S

## 2017-12-11 NOTE — Progress Notes (Signed)
Patient ID: Nancy Simmons, female   DOB: 25-Nov-1955, 62 y.o.   MRN: [id number] Due to lack of support at home, the patient will need short-term skilled nursing following her hospital stay.  Will consult Social Work.  Also, the patient does use C-PAP at home at bedtime and has not been able to get her family to bring her own machine.  Will consult Resp Therapy.

## 2017-12-11 NOTE — Evaluation (Signed)
Occupational Therapy Evaluation Patient Details Name: Nancy Simmons MRN: [id number] DOB: [birth year] Today's Date: 12/11/2017    History of Present Illness Patient is a 62 y.o. F with significant PMH of diabetes mellitus type 2, OSA on CPAP, stable angina, HTN, CAD, vertigo, and bilateral TKA's. S/p left total hip arthroplasty - direct anterior approach.   Clinical Impression   Pt admitted with the above diagnoses and presents with below problem list. Pt will benefit from continued acute OT to address the below listed deficits and maximize independence with basic ADLs prior to d/c to venue below. PTA pt needed some assist with ADLs (HHA a couple of hours a day). Pt is currently min guard-min A with functional transfers and pivotal steps within the room, max A with LB ADLs.      Follow Up Recommendations  SNF    Equipment Recommendations  Other (comment)(defer to next venue)    Recommendations for Other Services       Precautions / Restrictions Precautions Precautions: Fall Restrictions Weight Bearing Restrictions: Yes LLE Weight Bearing: Weight bearing as tolerated      Mobility Bed Mobility Overal bed mobility: Needs Assistance Bed Mobility: Sit to Supine       Sit to supine: Mod assist   General bed mobility comments: mod A to advance LLE up and onto bed.   Transfers Overall transfer level: Needs assistance Equipment used: Rolling walker (2 wheeled) Transfers: Sit to/from Stand Sit to Stand: Min guard         General transfer comment: min guard stand>sit EOB. cues for hand placement and rw technique    Balance Overall balance assessment: Needs assistance Sitting-balance support: Bilateral upper extremity supported;Feet supported Sitting balance-Leahy Scale: Good     Standing balance support: Bilateral upper extremity supported Standing balance-Leahy Scale: Poor Standing balance comment: reliant on external support                           ADL  either performed or assessed with clinical judgement   ADL Overall ADL's : Needs assistance/impaired Eating/Feeding: Set up;Sitting   Grooming: Set up;Sitting   Upper Body Bathing: Set up;Sitting   Lower Body Bathing: Maximal assistance;Sit to/from stand   Upper Body Dressing : Set up;Sitting   Lower Body Dressing: Maximal assistance;Sit to/from stand   Toilet Transfer: Minimal assistance;Min guard;Ambulation;Stand-pivot;BSC;RW Toilet Transfer Details (indicate cue type and reason): pivotal steps to [facility] Toileting- Clothing Manipulation and Hygiene: Maximal assistance;Sit to/from stand   Tub/ Shower Transfer: Maximal assistance;Stand-pivot;3 in 1;Rolling walker     General ADL Comments: Pt recieved standing with NT in room, finishing sponge bathing with NT assisting. Pt took pivotal steps to bed and completed bed mobility     Vision         Perception     Praxis      Pertinent Vitals/Pain Pain Assessment: Faces Faces Pain Scale: Hurts even more Pain Location: L hip with ambulation Pain Descriptors / Indicators: Guarding;Grimacing Pain Intervention(s): Limited activity within patient's tolerance;Monitored during session;Repositioned     Hand Dominance     Extremity/Trunk Assessment Upper Extremity Assessment Upper Extremity Assessment: Overall WFL for tasks assessed   Lower Extremity Assessment Lower Extremity Assessment: Defer to PT evaluation   Cervical / Trunk Assessment Cervical / Trunk Assessment: Other exceptions Cervical / Trunk Exceptions: rounded shoulders   Communication Communication Communication: No difficulties   Cognition Arousal/Alertness: Awake/alert Behavior During Therapy: WFL for tasks assessed/performed Overall Cognitive Status: Within  Functional Limits for tasks assessed                                     General Comments       Exercises     Shoulder Instructions      Home Living Family/patient expects to be  discharged to:: Private residence Living Arrangements: Alone Available Help at Discharge: Family;Available PRN/intermittently Type of Home: Apartment Home Access: Stairs to enter Entrance Stairs-Number of Steps: 5 Entrance Stairs-Rails: Can reach both Home Layout: One level     Bathroom Shower/Tub: Engineer, production Accessibility: Yes   Home Equipment: Emergency planning/management officer - 2 wheels;Cane - single point;Hospital bed          Prior Functioning/Environment Level of Independence: Needs assistance  Gait / Transfers Assistance Needed: uses RW for mobility ADL's / Homemaking Assistance Needed: Home health aide assists with ADL's/IADL's   Comments: Home health aide daily for 2.5 hours.         OT Problem List: Decreased activity tolerance;Impaired balance (sitting and/or standing);Decreased knowledge of use of DME or AE;Decreased knowledge of precautions;Pain      OT Treatment/Interventions: Self-care/ADL training;DME and/or AE instruction;Therapeutic activities;Balance training;Patient/family education    OT Goals(Current goals can be found in the care plan section) Acute Rehab OT Goals Patient Stated Goal: go to rehab OT Goal Formulation: With patient Time For Goal Achievement: 12/18/17 Potential to Achieve Goals: Good ADL Goals Pt Will Perform Lower Body Bathing: with min assist;sit to/from stand Pt Will Perform Lower Body Dressing: with min assist;sit to/from stand Pt Will Transfer to Toilet: with min assist;ambulating Pt Will Perform Toileting - Clothing Manipulation and hygiene: with min guard assist;sit to/from stand Additional ADL Goal #1: Pt will complete bed mobility at supervision level to prepare for OOB ADLs.  OT Frequency: Min 2X/week   Barriers to D/C:            Co-evaluation              AM-PAC PT "6 Clicks" Daily Activity     Outcome Measure Help from another person eating meals?: None Help from another person taking care of personal  grooming?: None Help from another person toileting, which includes using toliet, bedpan, or urinal?: A Lot Help from another person bathing (including washing, rinsing, drying)?: A Lot Help from another person to put on and taking off regular upper body clothing?: None Help from another person to put on and taking off regular lower body clothing?: A Lot 6 Click Score: 18   End of Session Equipment Utilized During Treatment: Rolling walker  Activity Tolerance: Patient limited by pain;Patient limited by fatigue;Patient tolerated treatment well Patient left: in bed;with call bell/phone within reach  OT Visit Diagnosis: Unsteadiness on feet (R26.81);Pain Pain - Right/Left: Left Pain - part of body: Hip                Time: 1610-9604 OT Time Calculation (min): 9 min Charges:  OT General Charges $OT Visit: 1 Visit OT Evaluation $OT Eval Low Complexity: 1 Low G-Codes:       Pilar Grammes 12/11/2017, 12:44 PM

## 2017-12-12 NOTE — [place] FL2 (Signed)
[place] MEDICAID FL2 LEVEL OF CARE SCREENING TOOL     IDENTIFICATION  Patient Name: Nancy Simmons Birthdate: [birth year] Sex: female Admission Date (Current Location): 12/10/2017  [facility] and [place] Number:  [employer] and Address:  [facility], [address] [place], [place] [address]      Provider Number: [id number]  Attending Physician Name and Address:  Kathryne Hitch,*  Relative Name and Phone Number:  Normand Sloop,  daughter, [phone]    Current Level of Care: Hospital Recommended Level of Care: Skilled Nursing Facility Prior Approval Number:    Date Approved/Denied:   PASRR Number: [id number]  Discharge Plan: SNF    Current Diagnoses: Patient Active Problem List   Diagnosis Date Noted  . Status post total replacement of left hip 12/10/2017  . Unilateral primary osteoarthritis, left hip 11/04/2017  . Abnormal stress echocardiography   . Stable angina (HCC) 04/03/2017  . DM2 (diabetes mellitus, type 2) (HCC) 04/03/2017  . Asthma 04/03/2017  . OSA on CPAP 04/03/2017  . Angina at rest [facility]) 04/03/2017  . Medication monitoring encounter 07/09/2016  . Autoimmune hepatitis (HCC) 07/09/2016  . 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 HTN (hypertension) 09/27/2015  . CAD (coronary artery disease) of artery bypass graft 09/27/2015  . Hypercholesterolemia 09/27/2015  . S/P cholecystectomy 09/27/2015    Orientation RESPIRATION BLADDER Height & Weight     Self, Time, Situation, Place  Normal Continent Weight:   Height:     BEHAVIORAL SYMPTOMS/MOOD NEUROLOGICAL BOWEL NUTRITION STATUS      Continent Diet(See DC Summary)  AMBULATORY STATUS COMMUNICATION OF NEEDS Skin   Extensive Assist Verbally Surgical wounds                       Personal Care Assistance Level of Assistance  Bathing, Feeding, Dressing Bathing Assistance: Maximum assistance Feeding assistance: Independent Dressing  Assistance: Maximum assistance     Functional Limitations Info  Sight, Hearing, Speech Sight Info: Adequate Hearing Info: Adequate Speech Info: Adequate    SPECIAL CARE FACTORS FREQUENCY  PT (By licensed PT), OT (By licensed OT)     PT Frequency: 7x week OT Frequency: 2x week            Contractures      Additional Factors Info  Code Status, Allergies Code Status Info: Full Allergies Info: TYLENOL ACETAMINOPHEN, PENICILLIN G            Current Medications (12/12/2017):  This is the current hospital active medication list Current Facility-Administered Medications  Medication Dose Route Frequency Provider Last Rate Last Dose  . 0.9 %  sodium chloride infusion   Intravenous Continuous Kathryne Hitch, MD      . albuterol (PROVENTIL) (2.5 MG/3ML) 0.083% nebulizer solution 2.5 mg  2.5 mg Nebulization Q6H PRN Kathryne Hitch, MD      . alum & mag hydroxide-simeth (MAALOX/MYLANTA) 200-200-20 MG/5ML suspension 30 mL  30 mL Oral Q4H PRN Kathryne Hitch, MD      . apixaban Everlene Balls) tablet 5 mg  5 mg Oral BID Kathryne Hitch, MD   5 mg at 12/12/17 4132  . aspirin EC tablet 81 mg  81 mg Oral Daily Kathryne Hitch, MD   81 mg at 12/12/17 4401  . azaTHIOprine (IMURAN) tablet 100 mg  100 mg Oral Daily Kathryne Hitch, MD   100 mg at 12/12/17 0905  . budesonide (PULMICORT) nebulizer solution 0.25  mg  0.25 mg Nebulization BID Kathryne Hitch, MD   0.25 mg at 12/12/17 0981  . diphenhydrAMINE (BENADRYL) 12.5 MG/5ML elixir 12.5-25 mg  12.5-25 mg Oral Q4H PRN Kathryne Hitch, MD      . docusate sodium (COLACE) capsule 100 mg  100 mg Oral BID Kathryne Hitch, MD   100 mg at 12/12/17 0904  . fenofibrate tablet 160 mg  160 mg Oral Daily Kathryne Hitch, MD   160 mg at 12/12/17 0905  . fesoterodine (TOVIAZ) tablet 8 mg  8 mg Oral Daily Kathryne Hitch, MD   8 mg at 12/12/17 0905  . gabapentin (NEURONTIN)  capsule 100 mg  100 mg Oral TID Kathryne Hitch, MD   100 mg at 12/12/17 0904  . HYDROmorphone (DILAUDID) injection 0.5-1 mg  0.5-1 mg Intravenous Q4H PRN Kathryne Hitch, MD   1 mg at 12/12/17 1157  . ipratropium-albuterol (DUONEB) 0.5-2.5 (3) MG/3ML nebulizer solution 3 mL  3 mL Inhalation Q6H PRN Kathryne Hitch, MD      . menthol-cetylpyridinium (CEPACOL) lozenge 3 mg  1 lozenge Oral PRN Kathryne Hitch, MD       Or  . phenol (CHLORASEPTIC) mouth spray 1 spray  1 spray Mouth/Throat PRN Kathryne Hitch, MD      . metFORMIN (GLUCOPHAGE) tablet 500 mg  500 mg Oral BID WC Kathryne Hitch, MD   500 mg at 12/12/17 0904  . methocarbamol (ROBAXIN) tablet 500 mg  500 mg Oral Q6H PRN Kathryne Hitch, MD   500 mg at 12/11/17 2020   Or  . methocarbamol (ROBAXIN) 500 mg in dextrose 5 % 50 mL IVPB  500 mg Intravenous Q6H PRN Kathryne Hitch, MD      . metoCLOPramide (REGLAN) tablet 5-10 mg  5-10 mg Oral Q8H PRN Kathryne Hitch, MD       Or  . metoCLOPramide (REGLAN) injection 5-10 mg  5-10 mg Intravenous Q8H PRN Kathryne Hitch, MD      . metoprolol tartrate (LOPRESSOR) tablet 50 mg  50 mg Oral BID Kathryne Hitch, MD   50 mg at 12/12/17 1914  . ondansetron (ZOFRAN) tablet 4 mg  4 mg Oral Q6H PRN Kathryne Hitch, MD       Or  . ondansetron [facility]) injection 4 mg  4 mg Intravenous Q6H PRN Kathryne Hitch, MD      . oxyCODONE (Oxy IR/ROXICODONE) immediate release tablet 10-15 mg  10-15 mg Oral Q4H PRN Kathryne Hitch, MD   15 mg at 12/12/17 1041  . oxyCODONE (Oxy IR/ROXICODONE) immediate release tablet 5-10 mg  5-10 mg Oral Q4H PRN Kathryne Hitch, MD      . pantoprazole (PROTONIX) EC tablet 40 mg  40 mg Oral Daily Kathryne Hitch, MD   40 mg at 12/12/17 0904  . polyethylene glycol (MIRALAX / GLYCOLAX) packet 17 g  17 g Oral Daily PRN Kathryne Hitch, MD      . tiotropium  [facility]) inhalation capsule 18 mcg  18 mcg Inhalation Daily Kathryne Hitch, MD   18 mcg at 12/12/17 0912  . zolpidem (AMBIEN) tablet 5 mg  5 mg Oral QHS PRN Kathryne Hitch, MD         Discharge Medications: Please see discharge summary for a list of discharge medications.  Relevant Imaging Results:  Relevant Lab Results:   Additional Information SS#: [id number], LCSW

## 2017-12-12 NOTE — Clinical Social Work Note (Signed)
Clinical Social Work Assessment  Patient Details  Name: Nancy Simmons MRN: [id number] Date of Birth: 1955-09-18  Date of referral:  12/12/17               Reason for consult:  Facility Placement                Permission sought to share information with:  Facility Art therapist granted to share information::  Yes, Verbal Permission Granted  Name::        Agency::  SNF  Relationship::     Contact Information:     Housing/Transportation Living arrangements for the past 2 months:  Single Family Home Source of Information:  Patient Patient Interpreter Needed:  None Criminal Activity/Legal Involvement Pertinent to Current Situation/Hospitalization:  No - Comment as needed Significant Relationships:  Other Family Members Lives with:  Self Do you feel safe going back to the place where you live?  No Need for family participation in patient care:  No (Coment)  Care giving concerns:  Pt from home alone and will need skilled nursing.  Social Worker assessment / plan:  CSW met with patient at bedside and discussed SNF recommendations. CSW discussed SNF options and placement. Pt was independent with ADL's and ambulated with cane/walker.  Pt has SNF experience.  CSW explained some barriers with placement due to medicaid. CSW obtained permission to send out to referrals to [place] area.   CSW will f/u for disposition.  Employment status:  Disabled (Comment on whether or not currently receiving Disability) Insurance information:  Medicaid In [place] PT Recommendations:  [place] / Referral to community resources:  [place]  Patient/Family's Response to care:  Patient agreeable to SNF and thanked CSW for assisting with disposition discussion.  Patient/Family's Understanding of and Emotional Response to Diagnosis, Current Treatment, and Prognosis:  Pt has good understanding of diagnosis and has good emotional state with disposition.  Pt agreeable to SNF and has been in the past. Pt hopeful to return home after rehabilitation. Pt desires to be close to home. CSW will f/u disposition.  Emotional Assessment Appearance:  Appears stated age Attitude/Demeanor/Rapport:  (Cooperative) Affect (typically observed):  Accepting, Appropriate Orientation:  Oriented to Situation, Oriented to  Time, Oriented to Place, Oriented to Self Alcohol / Substance use:  Not Applicable Psych involvement (Current and /or in the community):  Yes (Comment)  Discharge Needs  Concerns to be addressed:  Discharge Planning Concerns Readmission within the last 30 days:  No Current discharge risk:  Dependent with Mobility, Lives alone, Physical Impairment Barriers to Discharge:  No Barriers Identified   Normajean Baxter, LCSW 12/12/2017, 3:42 PM

## 2017-12-12 NOTE — Discharge Instructions (Addendum)
Information on my medicine - ELIQUIS® (apixaban) ° °Why was Eliquis® prescribed for you? °Eliquis® was prescribed for you to reduce the risk of a blood clot forming that can cause a stroke if you have a medical condition called atrial fibrillation (a type of irregular heartbeat). ° °What do You need to know about Eliquis® ? °Take your Eliquis® TWICE DAILY - one tablet in the morning and one tablet in the evening with or without food. If you have difficulty swallowing the tablet whole please discuss with your pharmacist how to take the medication safely. ° °Take Eliquis® exactly as prescribed by your doctor and DO NOT stop taking Eliquis® without talking to the doctor who prescribed the medication.  Stopping may increase your risk of developing a stroke.  Refill your prescription before you run out. ° °After discharge, you should have regular check-up appointments with your healthcare provider that is prescribing your Eliquis®.  In the future your dose may need to be changed if your kidney function or weight changes by a significant amount or as you get older. ° °What do you do if you miss a dose? °If you miss a dose, take it as soon as you remember on the same day and resume taking twice daily.  Do not take more than one dose of ELIQUIS at the same time to make up a missed dose. ° °Important Safety Information °A possible side effect of Eliquis® is bleeding. You should call your healthcare provider right away if you experience any of the following: °? Bleeding from an injury or your nose that does not stop. °? Unusual colored urine (red or dark brown) or unusual colored stools (red or black). °? Unusual bruising for unknown reasons. °? A serious fall or if you hit your head (even if there is no bleeding). ° °Some medicines may interact with Eliquis® and might increase your risk of bleeding or clotting while on Eliquis®. To help avoid this, consult your healthcare provider or pharmacist prior to using any new  prescription or non-prescription medications, including herbals, vitamins, non-steroidal anti-inflammatory drugs (NSAIDs) and supplements. ° °This website has more information on Eliquis® (apixaban): http://www.eliquis.com/eliquis/home °INSTRUCTIONS AFTER JOINT REPLACEMENT  ° °o Remove items at home which could result in a fall. This includes throw rugs or furniture in walking pathways °o ICE to the affected joint every three hours while awake for 30 minutes at a time, for at least the first 3-5 days, and then as needed for pain and swelling.  Continue to use ice for pain and swelling. You may notice swelling that will progress down to the foot and ankle.  This is normal after surgery.  Elevate your leg when you are not up walking on it.   °o Continue to use the breathing machine you got in the hospital (incentive spirometer) which will help keep your temperature down.  It is common for your temperature to cycle up and down following surgery, especially at night when you are not up moving around and exerting yourself.  The breathing machine keeps your lungs expanded and your temperature down. ° ° °DIET:  As you were doing prior to hospitalization, we recommend a well-balanced diet. ° °DRESSING / WOUND CARE / SHOWERING ° °Keep the surgical dressing until follow up.  The dressing is water proof, so you can shower without any extra covering.  IF THE DRESSING FALLS OFF or the wound gets wet inside, change the dressing with sterile gauze.  Please use good hand washing techniques before   changing the dressing.  Do not use any lotions or creams on the incision until instructed by your surgeon.   ° °ACTIVITY ° °o Increase activity slowly as tolerated, but follow the weight bearing instructions below.   °o No driving for 6 weeks or until further direction given by your physician.  You cannot drive while taking narcotics.  °o No lifting or carrying greater than 10 lbs. until further directed by your surgeon. °o Avoid periods  of inactivity such as sitting longer than an hour when not asleep. This helps prevent blood clots.  °o You may return to work once you are authorized by your doctor.  ° ° ° °WEIGHT BEARING  ° °Weight bearing as tolerated with assist device (walker, cane, etc) as directed, use it as long as suggested by your surgeon or therapist, typically at least 4-6 weeks. ° ° °EXERCISES ° °Results after joint replacement surgery are often greatly improved when you follow the exercise, range of motion and muscle strengthening exercises prescribed by your doctor. Safety measures are also important to protect the joint from further injury. Any time any of these exercises cause you to have increased pain or swelling, decrease what you are doing until you are comfortable again and then slowly increase them. If you have problems or questions, call your caregiver or physical therapist for advice.  ° °Rehabilitation is important following a joint replacement. After just a few days of immobilization, the muscles of the leg can become weakened and shrink (atrophy).  These exercises are designed to build up the tone and strength of the thigh and leg muscles and to improve motion. Often times heat used for twenty to thirty minutes before working out will loosen up your tissues and help with improving the range of motion but do not use heat for the first two weeks following surgery (sometimes heat can increase post-operative swelling).  ° °These exercises can be done on a training (exercise) mat, on the floor, on a table or on a bed. Use whatever works the best and is most comfortable for you.    Use music or television while you are exercising so that the exercises are a pleasant break in your day. This will make your life better with the exercises acting as a break in your routine that you can look forward to.   Perform all exercises about fifteen times, three times per day or as directed.  You should exercise both the operative leg and the  other leg as well. ° °Exercises include: °  °• Quad Sets - Tighten up the muscle on the front of the thigh (Quad) and hold for 5-10 seconds.   °• Straight Leg Raises - With your knee straight (if you were given a brace, keep it on), lift the leg to 60 degrees, hold for 3 seconds, and slowly lower the leg.  Perform this exercise against resistance later as your leg gets stronger.  °• Leg Slides: Lying on your back, slowly slide your foot toward your buttocks, bending your knee up off the floor (only go as far as is comfortable). Then slowly slide your foot back down until your leg is flat on the floor again.  °• Angel Wings: Lying on your back spread your legs to the side as far apart as you can without causing discomfort.  °• Hamstring Strength:  Lying on your back, push your heel against the floor with your leg straight by tightening up the muscles of your buttocks.  Repeat, but   this time bend your knee to a comfortable angle, and push your heel against the floor.  You may put a pillow under the heel to make it more comfortable if necessary.  ° °A rehabilitation program following joint replacement surgery can speed recovery and prevent re-injury in the future due to weakened muscles. Contact your doctor or a physical therapist for more information on knee rehabilitation.  ° ° °CONSTIPATION ° °Constipation is defined medically as fewer than three stools per week and severe constipation as less than one stool per week.  Even if you have a regular bowel pattern at home, your normal regimen is likely to be disrupted due to multiple reasons following surgery.  Combination of anesthesia, postoperative narcotics, change in appetite and fluid intake all can affect your bowels.  ° °YOU MUST use at least one of the following options; they are listed in order of increasing strength to get the job done.  They are all available over the counter, and you may need to use some, POSSIBLY even all of these options:   ° °Drink plenty  of fluids (prune juice may be helpful) and high fiber foods °Colace 100 mg by mouth twice a day  °Senokot for constipation as directed and as needed Dulcolax (bisacodyl), take with full glass of water  °Miralax (polyethylene glycol) once or twice a day as needed. ° °If you have tried all these things and are unable to have a bowel movement in the first 3-4 days after surgery call either your surgeon or your primary doctor.   ° °If you experience loose stools or diarrhea, hold the medications until you stool forms back up.  If your symptoms do not get better within 1 week or if they get worse, check with your doctor.  If you experience "the worst abdominal pain ever" or develop nausea or vomiting, please contact the office immediately for further recommendations for treatment. ° ° °ITCHING:  If you experience itching with your medications, try taking only a single pain pill, or even half a pain pill at a time.  You can also use Benadryl over the counter for itching or also to help with sleep.  ° °TED HOSE STOCKINGS:  Use stockings on both legs until for at least 2 weeks or as directed by physician office. They may be removed at night for sleeping. ° °MEDICATIONS:  See your medication summary on the “After Visit Summary” that nursing will review with you.  You may have some home medications which will be placed on hold until you complete the course of blood thinner medication.  It is important for you to complete the blood thinner medication as prescribed. ° °PRECAUTIONS:  If you experience chest pain or shortness of breath - call 911 immediately for transfer to the hospital emergency department.  ° °If you develop a fever greater that 101 F, purulent drainage from wound, increased redness or drainage from wound, foul odor from the wound/dressing, or calf pain - CONTACT YOUR SURGEON.   °                                                °FOLLOW-UP APPOINTMENTS:  If you do not already have a post-op appointment, please  call the office for an appointment to be seen by your surgeon.  Guidelines for how soon to be seen are listed in   your “After Visit Summary”, but are typically between 1-4 weeks after surgery. ° °OTHER INSTRUCTIONS:  ° °Knee Replacement:  Do not place pillow under knee, focus on keeping the knee straight while resting. CPM instructions: 0-90 degrees, 2 hours in the morning, 2 hours in the afternoon, and 2 hours in the evening. Place foam block, curve side up under heel at all times except when in CPM or when walking.  DO NOT modify, tear, cut, or change the foam block in any way. ° °MAKE SURE YOU:  °• Understand these instructions.  °• Get help right away if you are not doing well or get worse.  ° ° °Thank you for letting us be a part of your medical care team.  It is a privilege we respect greatly.  We hope these instructions will help you stay on track for a fast and full recovery!  ° °

## 2017-12-12 NOTE — Progress Notes (Signed)
RT placed patient on CPAP HS. 2L O2 bleed In needed. Patient tolerating well.

## 2017-12-12 NOTE — Progress Notes (Signed)
Physical Therapy Treatment Patient Details Name: Nancy Simmons MRN: [id number] DOB: [birth year] Today's Date: 12/12/2017    History of Present Illness Patient is a 62 y.o. F with significant PMH of diabetes mellitus type 2, OSA on CPAP, stable angina, HTN, CAD, vertigo, and bilateral TKA's. S/p left total hip arthroplasty - direct anterior approach.    PT Comments    Pt progressing slowly towards physical therapy goals. Was able to perform transfers with min assist and minimal ambulation before requiring a seated rest break. VSS throughout session, however pt having difficulty maintaining eyes open. Required increased time for initiation of all mobility throughout session. Will continue to follow. SNF remains the most appropriate d/c disposition.    Follow Up Recommendations  Follow surgeon's recommendation for DC plan and follow-up therapies     Equipment Recommendations  None recommended by PT    Recommendations for Other Services       Precautions / Restrictions Precautions Precautions: Fall Restrictions Weight Bearing Restrictions: Yes LLE Weight Bearing: Weight bearing as tolerated    Mobility  Bed Mobility               General bed mobility comments: Pt sitting up EOB when PT arrived.   Transfers Overall transfer level: Needs assistance Equipment used: Rolling walker (2 wheeled) Transfers: Sit to/from Stand Sit to Stand: Min assist         General transfer comment: Assist to power up to full stand. Pt required increased time and increased bed height to achieve full stand. VC's for hand placement on seated surface for safety.  Ambulation/Gait Ambulation/Gait assistance: Min guard Ambulation Distance (Feet): 5 Feet(3'; 2') Assistive device: Rolling walker (2 wheeled) Gait Pattern/deviations: Step-to pattern;Decreased step length - left;Trunk flexed Gait velocity: Decreased Gait velocity interpretation: <1.8 ft/sec, indicate of risk for recurrent  falls General Gait Details: VC's throughout for improved posture, increased walker proximity, and general sequencing. Pt moving extremely slow and had difficulty advancing surgical LE. Pt ambulated ~3 feet, required a seated rest break, and then ambulated another 2 feet to recliner.    Stairs             Wheelchair Mobility    Modified Rankin (Stroke Patients Only)       Balance Overall balance assessment: Needs assistance Sitting-balance support: Bilateral upper extremity supported;Feet supported Sitting balance-Leahy Scale: Fair     Standing balance support: Bilateral upper extremity supported Standing balance-Leahy Scale: Poor Standing balance comment: reliant on external support                            Cognition Arousal/Alertness: Awake/alert Behavior During Therapy: WFL for tasks assessed/performed Overall Cognitive Status: Within Functional Limits for tasks assessed                                        Exercises Total Joint Exercises Quad Sets: 5 reps;Both;Seated Long Arc Quad: 5 reps;Left;AAROM(Active assist to raise, with eccentric lower)    General Comments        Pertinent Vitals/Pain Pain Assessment: Faces Faces Pain Scale: Hurts whole lot Pain Location: L hip with ambulation Pain Descriptors / Indicators: Guarding;Grimacing Pain Intervention(s): Monitored during session;Limited activity within patient's tolerance    Home Living                      Prior  Function            PT Goals (current goals can now be found in the care plan section) Acute Rehab PT Goals Patient Stated Goal: go to rehab PT Goal Formulation: With patient Time For Goal Achievement: 12/16/17 Potential to Achieve Goals: Fair Progress towards PT goals: Progressing toward goals    Frequency    7X/week      PT Plan Current plan remains appropriate    Co-evaluation              AM-PAC PT "6 Clicks" Daily Activity   Outcome Measure  Difficulty turning over in bed (including adjusting bedclothes, sheets and blankets)?: Unable Difficulty moving from lying on back to sitting on the side of the bed? : Unable Difficulty sitting down on and standing up from a chair with arms (e.g., wheelchair, bedside commode, etc,.)?: A Little Help needed moving to and from a bed to chair (including a wheelchair)?: A Little Help needed walking in hospital room?: A Little Help needed climbing 3-5 steps with a railing? : A Lot 6 Click Score: 13    End of Session Equipment Utilized During Treatment: Gait belt Activity Tolerance: Patient limited by pain Patient left: in chair;with call bell/phone within reach Nurse Communication: Mobility status PT Visit Diagnosis: Unsteadiness on feet (R26.81);Muscle weakness (generalized) (M62.81);Difficulty in walking, not elsewhere classified (R26.2);Pain Pain - Right/Left: Left Pain - part of body: Hip     Time: 4098-1191 PT Time Calculation (min) (ACUTE ONLY): 31 min  Charges:  $Therapeutic Activity: 23-37 mins                    G Codes:       Conni Slipper, PT, DPT Acute Rehabilitation Services Pager: [phone]    Marylynn Pearson 12/12/2017, 12:31 PM

## 2017-12-12 NOTE — Progress Notes (Signed)
Subjective: 2 Days Post-Op Procedure(s) (LRB): LEFT TOTAL HIP ARTHROPLASTY ANTERIOR APPROACH (Left) Patient reports pain as mild.   No complaints.  Objective: Vital signs in last 24 hours: Temp:  [98.7 F (37.1 C)] 98.7 F (37.1 C) (05/22 1952) Pulse Rate:  [74-84] 78 (05/23 0904) Resp:  [16-18] 16 (05/23 0453) BP: (134-152)/(59-72) 134/67 (05/23 0904) SpO2:  [93 %-99 %] 93 % (05/23 0919)  Intake/Output from previous day: 05/22 0701 - 05/23 0700 In: 600 [P.O.:600] Out: 1600 [Urine:1600] Intake/Output this shift: Total I/O In: 120 [P.O.:120] Out: -   Recent Labs    12/11/17 0345  HGB 10.4*   Recent Labs    12/11/17 0345  WBC 6.9  RBC 3.56*  HCT 34.1*  PLT 279   Recent Labs    12/11/17 0345  NA 138  K 4.4  CL 106  CO2 22  BUN 11  CREATININE 0.63  GLUCOSE 135*  CALCIUM 9.5   Recent Labs    12/10/17 1105  INR 1.05   Left hip and lower extremity: Sensation intact distally Intact pulses distally Dorsiflexion/Plantar flexion intact Incision: dressing C/D/I Compartment soft    Assessment/Plan: 2 Days Post-Op Procedure(s) (LRB): LEFT TOTAL HIP ARTHROPLASTY ANTERIOR APPROACH (Left) Discharge to SNF possible discharge tomorrow. Up with PT.     Nancy Simmons 12/12/2017, 12:40 PM

## 2017-12-13 ENCOUNTER — Encounter (HOSPITAL_COMMUNITY): Payer: Self-pay | Admitting: General Practice

## 2017-12-13 ENCOUNTER — Telehealth (INDEPENDENT_AMBULATORY_CARE_PROVIDER_SITE_OTHER): Payer: Self-pay | Admitting: Orthopaedic Surgery

## 2017-12-13 ENCOUNTER — Other Ambulatory Visit: Payer: Self-pay

## 2017-12-13 MED ORDER — TIZANIDINE HCL 2 MG PO TABS: 2.0000 mg | ORAL_TABLET | Freq: Three times a day (TID) | ORAL | 0 refills | Status: DC | PRN

## 2017-12-13 MED ORDER — OXYCODONE HCL 10 MG PO TABS: 10.0000 mg | ORAL_TABLET | ORAL | 0 refills | Status: DC | PRN

## 2017-12-13 NOTE — Telephone Encounter (Signed)
Social Worker Redge Gainer  [phone]  Nancy Simmons  Pt room [address] [place] [address]  Nancy Simmons requesting  DME order  Cpap machine

## 2017-12-13 NOTE — Progress Notes (Signed)
Report given to [place] neal rn

## 2017-12-13 NOTE — Progress Notes (Signed)
CSW following for discharge plan. CSW provided bed offer to patient and discussed needing to stay 30 days; patient is in agreement. Per SNF, MD will need to put in order for patient's CPAP so the facility can get the equipment in for her. CSW contacted MD office for request.  CSW to continue to follow. Patient will need order for CPAP prior to discharge.  Blenda Nicely, [place] Clinical Social Worker [phone]

## 2017-12-13 NOTE — Progress Notes (Signed)
Physical Therapy Treatment Patient Details Name: Nancy Simmons MRN: [id number] DOB: 05/25/1956 Today's Date: 12/13/2017    History of Present Illness Patient is a 62 y.o. F with significant PMH of diabetes mellitus type 2, OSA on CPAP, stable angina, HTN, CAD, vertigo, and bilateral TKA's. S/p left total hip arthroplasty - direct anterior approach.    PT Comments    Pt progressing slowly towards physical therapy goals. Was able to perform transfers and ambulation with gross min assist but overall requires significant time and several seated rest breaks. Tolerance for functional activity remains low. Plan is for d/c to SNF today. Will continue to follow until pt is transferred out.   Follow Up Recommendations  Follow surgeon's recommendation for DC plan and follow-up therapies     Equipment Recommendations  None recommended by PT    Recommendations for Other Services       Precautions / Restrictions Precautions Precautions: Fall Restrictions Weight Bearing Restrictions: Yes LLE Weight Bearing: Weight bearing as tolerated    Mobility  Bed Mobility                  Transfers Overall transfer level: Needs assistance Equipment used: Rolling walker (2 wheeled) Transfers: Sit to/from Stand Sit to Stand: Min assist         General transfer comment: Assist to power up to full stand. Pt required increased time to achieve full stand. VC's for hand placement on seated surface for safety.  Ambulation/Gait Ambulation/Gait assistance: Min guard Ambulation Distance (Feet): 10 Feet Assistive device: Rolling walker (2 wheeled) Gait Pattern/deviations: Step-to pattern;Decreased step length - left;Trunk flexed Gait velocity: Decreased Gait velocity interpretation: <1.8 ft/sec, indicate of risk for recurrent falls General Gait Details: VC's throughout for improved posture, increased walker proximity, and general sequencing. Pt moving extremely slow and had difficulty advancing  surgical LE. Pt ambulated 3 feet, required a seated rest break, and then ambulated another 7 feet.    Stairs             Wheelchair Mobility    Modified Rankin (Stroke Patients Only)       Balance Overall balance assessment: Needs assistance Sitting-balance support: Bilateral upper extremity supported;Feet supported Sitting balance-Leahy Scale: Fair     Standing balance support: Bilateral upper extremity supported Standing balance-Leahy Scale: Poor Standing balance comment: reliant on external support                            Cognition Arousal/Alertness: Awake/alert Behavior During Therapy: WFL for tasks assessed/performed Overall Cognitive Status: Within Functional Limits for tasks assessed                                        Exercises Total Joint Exercises Quad Sets: 10 reps;Both Hip ABduction/ADduction: 10 reps;AAROM;Left Long Arc Quad: 10 reps;Left    General Comments        Pertinent Vitals/Pain Pain Assessment: Faces Faces Pain Scale: Hurts whole lot Pain Location: L hip with ambulation Pain Descriptors / Indicators: Guarding;Grimacing Pain Intervention(s): Limited activity within patient's tolerance;Monitored during session    Home Living                      Prior Function            PT Goals (current goals can now be found in the care plan section)  Acute Rehab PT Goals Patient Stated Goal: go to rehab PT Goal Formulation: With patient Time For Goal Achievement: 12/16/17 Potential to Achieve Goals: Fair Progress towards PT goals: Progressing toward goals    Frequency    7X/week      PT Plan Current plan remains appropriate    Co-evaluation              AM-PAC PT "6 Clicks" Daily Activity  Outcome Measure  Difficulty turning over in bed (including adjusting bedclothes, sheets and blankets)?: Unable Difficulty moving from lying on back to sitting on the side of the bed? :  Unable Difficulty sitting down on and standing up from a chair with arms (e.g., wheelchair, bedside commode, etc,.)?: A Little Help needed moving to and from a bed to chair (including a wheelchair)?: A Little Help needed walking in hospital room?: A Little Help needed climbing 3-5 steps with a railing? : A Lot 6 Click Score: 13    End of Session Equipment Utilized During Treatment: Gait belt Activity Tolerance: Patient limited by pain Patient left: in chair;with call bell/phone within reach Nurse Communication: Mobility status PT Visit Diagnosis: Unsteadiness on feet (R26.81);Muscle weakness (generalized) (M62.81);Difficulty in walking, not elsewhere classified (R26.2);Pain Pain - Right/Left: Left Pain - part of body: Hip     Time: 6213-0865 PT Time Calculation (min) (ACUTE ONLY): 31 min  Charges:  $Gait Training: 8-22 mins $Therapeutic Exercise: 8-22 mins                    G Codes:       Conni Slipper, PT, DPT Acute Rehabilitation Services Pager: [phone]    Marylynn Pearson 12/13/2017, 12:37 PM

## 2017-12-13 NOTE — Progress Notes (Signed)
Discharge instructions completed with pt.  Pt verbalized understanding of the information.  Pt denies chest pain, shortness of breath, dizziness, lightheadedness, and n/v.  Pt waiting for discharge to SNF.

## 2017-12-13 NOTE — Telephone Encounter (Signed)
Sent to you by text aswell

## 2017-12-13 NOTE — Clinical Social Work Placement (Addendum)
Nurse to call report to [phone], Room 409b    CLINICAL SOCIAL WORK PLACEMENT  NOTE  Date:  12/13/2017  Patient Details  Name: Nancy Simmons MRN: [id number] Date of Birth: February 09, 1956  Clinical Social Work is seeking post-discharge placement for this patient at the Skilled  Nursing Facility level of care (*CSW will initial, date and re-position this form in  chart as items are completed):  Yes   Patient/family provided with [facility] Clinical Social Work Department's list of facilities offering this level of care within the geographic area requested by the patient (or if unable, by the patient's family).  Yes   Patient/family informed of their freedom to choose among providers that offer the needed level of care, that participate in Medicare, Medicaid or managed care program needed by the patient, have an available bed and are willing to accept the patient.  Yes   Patient/family informed of [facility]'s ownership interest in [facility] and [facility], as well as of the fact that they are under no obligation to receive care at these facilities.  PASRR submitted to EDS on       PASRR number received on       Existing PASRR number confirmed on 12/12/17     FL2 transmitted to all facilities in geographic area requested by pt/family on 12/12/17     FL2 transmitted to all facilities within larger geographic area on       Patient informed that his/her managed care company has contracts with or will negotiate with certain facilities, including the following:        Yes   Patient/family informed of bed offers received.  Patient chooses bed at [facility]     Physician recommends and patient chooses bed at      Patient to be transferred to [facility] on 12/13/17.  Patient to be transferred to facility by [facility]     Patient family notified on 12/13/17 of transfer.  Name of family member notified:  Patient responsible for self      PHYSICIAN       Additional Comment:    _______________________________________________ Baldemar Lenis, LCSW 12/13/2017, 11:07 AM

## 2017-12-13 NOTE — Discharge Summary (Signed)
Patient ID: Nancy Simmons MRN: [id number] DOB/AGE: 12-28-1955 62 y.o.  Admit date: 12/10/2017 Discharge date: 12/13/2017  Admission Diagnoses:  Principal Problem:   Unilateral primary osteoarthritis, left hip Active Problems:   Status post total replacement of left hip   Discharge Diagnoses:  Same  Past Medical History:  Diagnosis Date  . Arthritis    "pretty much all over" (04/03/2017)  . Asthma   . Autoimmune hepatitis (HCC)   . Coronary artery disease    mild-mod by 2018 cath  . Family history of adverse reaction to anesthesia    "granddaughter has PONV"  . GERD (gastroesophageal reflux disease)   . High cholesterol   . History of blood transfusion    "in [place]; related to knee surgeries"  . History of kidney stones   . OSA on CPAP   . Type II diabetes mellitus (HCC)     Surgeries: Procedure(s): LEFT TOTAL HIP ARTHROPLASTY ANTERIOR APPROACH on 12/10/2017   Consultants:   Discharged Condition: Improved  Hospital Course: KINLEIGH NAULT is an 62 y.o. female who was admitted 12/10/2017 for operative treatment ofUnilateral primary osteoarthritis, left hip. Patient has severe unremitting pain that affects sleep, daily activities, and work/hobbies. After pre-op clearance the patient was taken to the operating room on 12/10/2017 and underwent  Procedure(s): LEFT TOTAL HIP ARTHROPLASTY ANTERIOR APPROACH.    Patient was given perioperative antibiotics:  Anti-infectives (From admission, onward)   Start     Dose/Rate Route Frequency Ordered Stop   12/10/17 2000  clindamycin (CLEOCIN) IVPB 600 mg     600 mg 100 mL/hr over 30 Minutes Intravenous Every 6 hours 12/10/17 1838 12/11/17 0332   12/10/17 1030  clindamycin (CLEOCIN) IVPB 900 mg     900 mg 100 mL/hr over 30 Minutes Intravenous On call to O.R. 12/10/17 1021 12/10/17 1225   12/10/17 1017  clindamycin (CLEOCIN) 900 MG/50ML IVPB    Note to Pharmacy:  Ray Church   : cabinet override      12/10/17 1017 12/10/17 1225        Patient was given sequential compression devices, early ambulation, and chemoprophylaxis to prevent DVT.  Patient benefited maximally from hospital stay and there were no complications.    Recent vital signs:  Patient Vitals for the past 24 hrs:  BP Temp Temp src Pulse Resp SpO2  12/13/17 0500 (!) 125/54 99.2 F (37.3 C) Oral 74 - 97 %  12/12/17 2100 - - - (!) 113 - 93 %  12/12/17 2005 (!) 137/93 99.7 F (37.6 C) Oral (!) 101 - 97 %  12/12/17 1529 132/66 99.6 F (37.6 C) Oral 91 19 96 %  12/12/17 0919 - - - - - 93 %  12/12/17 0904 134/67 - - 78 - -     Recent laboratory studies:  Recent Labs    12/10/17 1105 12/11/17 0345  WBC  --  6.9  HGB  --  10.4*  HCT  --  34.1*  PLT  --  279  NA  --  138  K  --  4.4  CL  --  106  CO2  --  22  BUN  --  11  CREATININE  --  0.63  GLUCOSE  --  135*  INR 1.05  --   CALCIUM  --  9.5     Discharge Medications:   Allergies as of 12/13/2017      Reactions   Tylenol [acetaminophen] Other (See Comments)   Due to Autoimmune hepatitis  Penicillin G Rash   Has patient had a PCN reaction causing immediate rash, facial/tongue/throat swelling, SOB or lightheadedness with hypotension: No Has patient had a PCN reaction causing severe rash involving mucus membranes or skin necrosis: No Has patient had a PCN reaction that required hospitalization: No Has patient had a PCN reaction occurring within the last 10 years: Yes If all of the above answers are "NO", then may proceed with Cephalosporin use.      Medication List    STOP taking these medications   meloxicam 7.5 MG tablet Commonly known as:  MOBIC   naproxen sodium 220 MG tablet Commonly known as:  ALEVE     TAKE these medications   albuterol (2.5 MG/3ML) 0.083% nebulizer solution Commonly known as:  PROVENTIL Take 2.5 mg by nebulization every 6 (six) hours as needed for wheezing or shortness of breath.   apixaban 5 MG Tabs tablet Commonly known as:  ELIQUIS Take 5  mg by mouth 2 (two) times daily.   aspirin EC 81 MG tablet Take 81 mg by mouth daily.   azaTHIOprine 50 MG tablet Commonly known as:  IMURAN Take 100 mg by mouth daily.   beclomethasone 80 MCG/ACT inhaler Commonly known as:  QVAR Inhale 2 puffs into the lungs 2 (two) times daily. Reported on 01/16/2016   COMBIVENT RESPIMAT 20-100 MCG/ACT Aers respimat Generic drug:  Ipratropium-Albuterol Inhale 1 puff into the lungs every 6 (six) hours as needed for wheezing or shortness of breath.   fesoterodine 8 MG Tb24 tablet Commonly known as:  TOVIAZ Take 8 mg by mouth daily.   gabapentin 100 MG capsule Commonly known as:  NEURONTIN Take 100 mg by mouth 3 (three) times daily as needed (for nerve pain.).   lisinopril 20 MG tablet Commonly known as:  PRINIVIL,ZESTRIL Take 20 mg by mouth daily.   meclizine 25 MG tablet Commonly known as:  ANTIVERT Take 25 mg by mouth 3 (three) times daily as needed for dizziness.   metFORMIN 500 MG tablet Commonly known as:  GLUCOPHAGE Take 500 mg by mouth 2 (two) times daily with a meal.   metoprolol tartrate 25 MG tablet Commonly known as:  LOPRESSOR Take 50 mg by mouth 2 (two) times daily.   Oxycodone HCl 10 MG Tabs Take 1-1.5 tablets (10-15 mg total) by mouth every 4 (four) hours as needed for severe pain (pain score 7-10). What changed:    how much to take  when to take this  reasons to take this   pantoprazole 40 MG tablet Commonly known as:  PROTONIX Take 40 mg by mouth daily.   tiotropium 18 MCG inhalation capsule Commonly known as:  SPIRIVA Place 18 mcg into inhaler and inhale daily.   tiZANidine 2 MG tablet Commonly known as:  ZANAFLEX Take 1 tablet (2 mg total) by mouth every 8 (eight) hours as needed for muscle spasms.   TRILIPIX 135 MG capsule Generic drug:  Choline Fenofibrate Take 135 mg by mouth daily.            Durable Medical Equipment  (From admission, onward)        Start     Ordered   12/10/17 1839   DME 3 n 1  Once     12/10/17 1838   12/10/17 1839  DME Walker rolling  Once    Question:  Patient needs a walker to treat with the following condition  Answer:  Status post total replacement of left hip   12/10/17 1838  Diagnostic Studies: Dg Pelvis Portable  Result Date: 12/10/2017 CLINICAL DATA:  Status post total hip replacement EXAM: PORTABLE PELVIS 1-2 VIEWS COMPARISON:  November 04, 2017 and intraoperative study Dec 10, 2017 FINDINGS: Frontal image of lower pelvis and both hips obtained. There is a total hip replacement on the left with prosthetic components well-seated on frontal view. No acute fracture or dislocation. There is moderate narrowing of the right hip joint. IMPRESSION: Total hip replacement on the left with prosthetic components appearing well-seated on frontal view. No fracture or dislocation. Moderate narrowing right hip joint. Electronically Signed   By: Bretta Bang III M.D.   On: 12/10/2017 15:05   Dg C-arm 1-60 Min  Result Date: 12/10/2017 CLINICAL DATA:  Left total hip arthroplasty from anterior approach. EXAM: DG C-ARM 61-120 MIN; OPERATIVE LEFT HIP WITH PELVIS COMPARISON:  None. FINDINGS: A total of 32 seconds of fluoroscopic time was utilized for placement of an uncemented left total hip arthroplasty. Fine bony detail is limited by the C-arm fluoroscopic technique. Two views are provided demonstrating no immediate intraoperative complications nor evidence of hardware failure. IMPRESSION: Fluoroscopic time utilized for placement of left total hip arthroplasty. No immediate complications identified. Electronically Signed   By: Tollie Eth M.D.   On: 12/10/2017 14:31   Dg Hip Operative Unilat W Or W/o Pelvis Left  Result Date: 12/10/2017 CLINICAL DATA:  Left total hip arthroplasty from anterior approach. EXAM: DG C-ARM 61-120 MIN; OPERATIVE LEFT HIP WITH PELVIS COMPARISON:  None. FINDINGS: A total of 32 seconds of fluoroscopic time was utilized for placement of an  uncemented left total hip arthroplasty. Fine bony detail is limited by the C-arm fluoroscopic technique. Two views are provided demonstrating no immediate intraoperative complications nor evidence of hardware failure. IMPRESSION: Fluoroscopic time utilized for placement of left total hip arthroplasty. No immediate complications identified. Electronically Signed   By: Tollie Eth M.D.   On: 12/10/2017 14:31    Disposition: Discharge disposition: 03-Skilled Nursing Facility         Follow-up Information    Kathryne Hitch, MD Follow up in 2 week(s).   Specialty:  Orthopedic Surgery Contact information: [address] [place] [address] [phone]            Signed: Kathryne Hitch 12/13/2017, 6:38 AM

## 2017-12-13 NOTE — Progress Notes (Signed)
Patient ID: Nancy Simmons, female   DOB: 1955/08/15, 62 y.o.   MRN: [id number] Doing well overall.  Left hip stable.  Can be discharged to skilled nursing today.

## 2017-12-24 ENCOUNTER — Inpatient Hospital Stay (INDEPENDENT_AMBULATORY_CARE_PROVIDER_SITE_OTHER): Payer: Medicaid Other | Admitting: Orthopaedic Surgery

## 2017-12-26 ENCOUNTER — Encounter (INDEPENDENT_AMBULATORY_CARE_PROVIDER_SITE_OTHER): Payer: Self-pay | Admitting: Orthopaedic Surgery

## 2017-12-26 ENCOUNTER — Ambulatory Visit (INDEPENDENT_AMBULATORY_CARE_PROVIDER_SITE_OTHER): Payer: Medicaid Other | Admitting: Orthopaedic Surgery

## 2017-12-26 DIAGNOSIS — Z96642 Presence of left artificial hip joint: Secondary | ICD-10-CM

## 2017-12-26 NOTE — Progress Notes (Signed)
The patient is 16 days status post a left total hip arthroplasty through direct anterior approach.  She is 62 years old with a BMI of 51.9.  She is staying in a nursing care facility but says she is doing well overall.  On exam her left hip incision looks good.  I removed all the sutures and placed Steri-Strips.  There is no significant seroma or wound breakdown.  She understands that skin and wound complications after anterior hip surgery morbidly obese people is so much higher risk.  Overall she seems to do well.  Her leg lengths are equal.  Her pain is not significant.  At this point should continue to watch her incision daily.  If there is any issues at all she will come and see us sooner.  We do not really see her back for 4 weeks unless there is any issues.

## 2018-01-09 ENCOUNTER — Encounter (INDEPENDENT_AMBULATORY_CARE_PROVIDER_SITE_OTHER): Payer: Self-pay | Admitting: Orthopaedic Surgery

## 2018-01-09 ENCOUNTER — Ambulatory Visit (INDEPENDENT_AMBULATORY_CARE_PROVIDER_SITE_OTHER): Payer: Medicaid Other | Admitting: Orthopaedic Surgery

## 2018-01-09 DIAGNOSIS — Z96642 Presence of left artificial hip joint: Secondary | ICD-10-CM

## 2018-01-09 NOTE — Progress Notes (Signed)
Patient is now 4 weeks status post a left total hip arthroplasty.  She is someone who is morbidly obese and is worried about her soft tissue.  She is doing well overall.  She is been [place] at a skilled nursing facility and is scheduled to go home Monday.  She feels much better than she did before surgery.  On examination her left hip incision really looks good none pleased with how it looks.  There is no evidence of infection at all.  Should continue increase activities as comfort allows.  I like to see her back in 4 weeks to see how she is doing overall but no x-rays are needed.

## 2018-01-22 ENCOUNTER — Ambulatory Visit (INDEPENDENT_AMBULATORY_CARE_PROVIDER_SITE_OTHER): Payer: Medicaid Other | Admitting: Orthopaedic Surgery

## 2018-02-10 ENCOUNTER — Encounter (INDEPENDENT_AMBULATORY_CARE_PROVIDER_SITE_OTHER): Payer: Self-pay | Admitting: Orthopaedic Surgery

## 2018-02-10 ENCOUNTER — Ambulatory Visit (INDEPENDENT_AMBULATORY_CARE_PROVIDER_SITE_OTHER): Payer: Medicaid Other | Admitting: Orthopaedic Surgery

## 2018-02-10 DIAGNOSIS — Z96642 Presence of left artificial hip joint: Secondary | ICD-10-CM

## 2018-02-10 NOTE — Progress Notes (Signed)
Patient is a very pleasant 62 year old female who is 62 days status post a left total hip arthroplasty.  She has a BMI of almost 52.  She has done well in her postoperative course.  She is had no wound issues at all.  She ablates with a cane but mainly due to the previous right knee replacement was done elsewhere.  On examination of her left operative hip I can put her through range of motion easily without any difficulty.  Her incision is really good and is healed nicely.  At this point she will continue increase her activities as comfort allows.  We will see her back in 6 months with a low AP pelvis and lateral of her left operative hip.  If there is any issues before then she will let us know.

## 2018-03-05 ENCOUNTER — Encounter (INDEPENDENT_AMBULATORY_CARE_PROVIDER_SITE_OTHER): Payer: Self-pay | Admitting: Orthopaedic Surgery

## 2018-03-05 ENCOUNTER — Ambulatory Visit (INDEPENDENT_AMBULATORY_CARE_PROVIDER_SITE_OTHER): Payer: Medicaid Other

## 2018-03-05 ENCOUNTER — Telehealth (INDEPENDENT_AMBULATORY_CARE_PROVIDER_SITE_OTHER): Payer: Self-pay | Admitting: Orthopaedic Surgery

## 2018-03-05 ENCOUNTER — Ambulatory Visit (INDEPENDENT_AMBULATORY_CARE_PROVIDER_SITE_OTHER): Payer: Medicaid Other | Admitting: Orthopaedic Surgery

## 2018-03-05 DIAGNOSIS — M25552 Pain in left hip: Secondary | ICD-10-CM

## 2018-03-05 DIAGNOSIS — Z96642 Presence of left artificial hip joint: Secondary | ICD-10-CM

## 2018-03-05 MED ORDER — OXYCODONE HCL 10 MG PO TABS: 10.0000 mg | ORAL_TABLET | ORAL | 0 refills | Status: DC | PRN

## 2018-03-05 MED ORDER — TIZANIDINE HCL 2 MG PO TABS: 2.0000 mg | ORAL_TABLET | Freq: Three times a day (TID) | ORAL | 0 refills | Status: DC | PRN

## 2018-03-05 NOTE — Progress Notes (Signed)
The patient comes in today for continued follow-up status post a left total hip arthroplasty.  This was done 85 years ago.  She is someone who is morbidly obese and in pain management.  She has a rolling walker.  She is had previous lumbar spine surgery.  She is on Eliquis and cannot take anti-inflammatories.  She has pain going down her backside on the left side which is her operative side but on the way down to her foot which is more sciatic and back related.  She want to make sure that the hip is doing well.  She says all weekend she had pain in the hip but again denies it being in the groin.  She was having to bend over a lot she is not sure if she has overdone things.  On exam I can easily put her left hip through internal and external rotation without difficulty.  Her incisions healed nicely.  She does have a positive straight leg raise on that side.  She does not appear significantly uncomfortable.  AP pelvis and lateral of her left hip shows a well-seated total hip arthroplasty with no complicating features.  We will continue her pain medication and her muscle relaxants.  She is already on Neurontin as well.  She will work on mainly activity modification.  I will see her back in 3 months to see how she is doing overall but no x-rays are needed.

## 2018-03-05 NOTE — Telephone Encounter (Signed)
Patient called to state that was here this morning and given script for Oxycodone and Zanaflex. Patient requested for the Oxy to be 15 mg not 10.  Please call patient to advise.  [phone]

## 2018-03-05 NOTE — Telephone Encounter (Signed)
That dose is all they will allow me to do.

## 2018-03-05 NOTE — Telephone Encounter (Signed)
Please advise 

## 2018-03-05 NOTE — Telephone Encounter (Signed)
Patient aware 10mg  is as high as we will prescribe

## 2018-03-26 ENCOUNTER — Other Ambulatory Visit (INDEPENDENT_AMBULATORY_CARE_PROVIDER_SITE_OTHER): Payer: Self-pay | Admitting: Orthopaedic Surgery

## 2018-03-26 NOTE — Telephone Encounter (Signed)
Please advise 

## 2018-04-29 ENCOUNTER — Other Ambulatory Visit (INDEPENDENT_AMBULATORY_CARE_PROVIDER_SITE_OTHER): Payer: Self-pay | Admitting: Orthopaedic Surgery

## 2018-04-29 NOTE — Telephone Encounter (Signed)
Please advise 

## 2018-05-05 ENCOUNTER — Other Ambulatory Visit: Payer: Self-pay | Admitting: Neurosurgery

## 2018-05-12 NOTE — Pre-Procedure Instructions (Signed)
Nancy Simmons  05/12/2018      [facility] [place], [place] - [address] [place] [address] Phone: [phone] Fax: [phone]    Your procedure is scheduled on Oct.31  Report to [facility] Admitting at 9:30 A.M.  Call this number if you have problems the morning of surgery:  [phone]   Remember:  Do not eat or drink after midnight.      Take these medicines the morning of surgery with A SIP OF WATER               Albuterol nebulizer if needed             arathioprine (imuran)             qvar--bring to hospital             Bupropion (wellbutin)             fesoterodine (toviaz)             Gabapentin (neurontin)             combivent inhaler--bring to hospital             Meclizine if needed             Metoprolol (lopressor)             Oxycodone if needed             Pantoprazole (protonix)            spriva --bring to hospital           t izanidine (zanaflex) if needed                 7 days prior to surgery STOP taking any Aspirin(unless otherwise instructed by your surgeon), Aleve, Naproxen, Ibuprofen, Motrin, Advil, Goody's, BC's, all herbal medications, fish oil, and all vitamins                 Follow your surgeon's instructions on when to stop Asprin na eliquis.  If no instructions were given by your surgeon then you will need to call the office to get those instructions.                              How to Manage Your Diabetes Before and After Surgery  Why is it important to control my blood sugar before and after surgery? . Improving blood sugar levels before and after surgery helps healing and can limit problems. . A way of improving blood sugar control is eating a healthy diet by: o  Eating less sugar and carbohydrates o  Increasing activity/exercise o  Talking with your doctor about reaching your blood sugar goals . High blood sugars (greater than 180 mg/dL) can raise your risk of infections and  slow your recovery, so you will need to focus on controlling your diabetes during the weeks before surgery. . Make sure that the doctor who takes care of your diabetes knows about your planned surgery including the date and location.  How do I manage my blood sugar before surgery? . Check your blood sugar at least 4 times a day, starting 2 days before surgery, to make sure that the level is not too high or low. o Check your blood sugar the morning of your surgery when you wake up and every 2 hours until you get to the  Short Stay unit. . If your blood sugar is less than 70 mg/dL, you will need to treat for low blood sugar: o Do not take insulin. o Treat a low blood sugar (less than 70 mg/dL) with  cup of clear juice (cranberry or apple), 4 glucose tablets, OR glucose gel. Recheck blood sugar in 15 minutes after treatment (to make sure it is greater than 70 mg/dL). If your blood sugar is not greater than 70 mg/dL on recheck, call [phone] o  for further instructions. . Report your blood sugar to the short stay nurse when you get to Short Stay.  . If you are admitted to the hospital after surgery: o Your blood sugar will be checked by the staff and you will probably be given insulin after surgery (instead of oral diabetes medicines) to make sure you have good blood sugar levels. o The goal for blood sugar control after surgery is 80-180 mg/dL.        WHAT DO I DO ABOUT MY DIABETES MEDICATION?   Marland Kitchen Do not take oral diabetes medicines (pills) the morning of surgery.     Do not wear jewelry, make-up or nail polish.  Do not wear lotions, powders, or perfumes, or deodorant.  Do not shave 48 hours prior to surgery.  Men may shave face and neck.  Do not bring valuables to the hospital.  [facility] is not responsible for any belongings or valuables.  Contacts, dentures or bridgework may not be worn into surgery.  Leave your suitcase in the car.  After surgery it may be brought to your  room.  For patients admitted to the hospital, discharge time will be determined by your treatment team.  Patients discharged the day of surgery will not be allowed to drive home.    Special instructions: [facility]- Preparing For Surgery  Before surgery, you can play an important role. Because skin is not sterile, your skin needs to be as free of germs as possible. You can reduce the number of germs on your skin by washing with CHG (chlorahexidine gluconate) Soap before surgery.  CHG is an antiseptic cleaner which kills germs and bonds with the skin to continue killing germs even after washing.    Oral Hygiene is also important to reduce your risk of infection.  Remember - BRUSH YOUR TEETH THE MORNING OF SURGERY WITH YOUR REGULAR TOOTHPASTE  Please do not use if you have an allergy to CHG or antibacterial soaps. If your skin becomes reddened/irritated stop using the CHG.  Do not shave (including legs and underarms) for at least 48 hours prior to first CHG shower. It is OK to shave your face.  Please follow these instructions carefully.   1. Shower the NIGHT BEFORE SURGERY and the MORNING OF SURGERY with CHG.   2. If you chose to wash your hair, wash your hair first as usual with your normal shampoo.  3. After you shampoo, rinse your hair and body thoroughly to remove the shampoo.  4. Use CHG as you would any other liquid soap. You can apply CHG directly to the skin and wash gently with a scrungie or a clean washcloth.   5. Apply the CHG Soap to your body ONLY FROM THE NECK DOWN.  Do not use on open wounds or open sores. Avoid contact with your eyes, ears, mouth and genitals (private parts). Wash Face and genitals (private parts)  with your normal soap.  6. Wash thoroughly, paying special attention to the area where  your surgery will be performed.  7. Thoroughly rinse your body with warm water from the neck down.  8. DO NOT shower/wash with your normal soap after using and rinsing  off the CHG Soap.  9. Pat yourself dry with a CLEAN TOWEL.  10. Wear CLEAN PAJAMAS to bed the night before surgery, wear comfortable clothes the morning of surgery  11. Place CLEAN SHEETS on your bed the night of your first shower and DO NOT SLEEP WITH PETS.    Day of Surgery:  Do not apply any deodorants/lotions.  Please wear clean clothes to the hospital/surgery center.   Remember to brush your teeth WITH YOUR REGULAR TOOTHPASTE.     Please read over the following fact sheets that you were given. Coughing and Deep Breathing, MRSA Information and Surgical Site Infection Prevention

## 2018-05-13 ENCOUNTER — Other Ambulatory Visit: Payer: Self-pay

## 2018-05-13 ENCOUNTER — Encounter (HOSPITAL_COMMUNITY)
Admission: RE | Admit: 2018-05-13 | Discharge: 2018-05-13 | Disposition: A | Discharge status: Home / Self Care | Payer: Medicaid Other | Source: Ambulatory Visit | Attending: Neurosurgery | Admitting: Neurosurgery

## 2018-05-13 ENCOUNTER — Encounter (HOSPITAL_COMMUNITY): Payer: Self-pay

## 2018-05-13 DIAGNOSIS — G4733 Obstructive sleep apnea (adult) (pediatric): Secondary | ICD-10-CM | POA: Diagnosis not present

## 2018-05-13 DIAGNOSIS — E119 Type 2 diabetes mellitus without complications: Secondary | ICD-10-CM | POA: Diagnosis not present

## 2018-05-13 DIAGNOSIS — Z90710 Acquired absence of both cervix and uterus: Secondary | ICD-10-CM | POA: Insufficient documentation

## 2018-05-13 DIAGNOSIS — Z01818 Encounter for other preprocedural examination: Secondary | ICD-10-CM | POA: Insufficient documentation

## 2018-05-13 DIAGNOSIS — I10 Essential (primary) hypertension: Secondary | ICD-10-CM | POA: Insufficient documentation

## 2018-05-13 DIAGNOSIS — K219 Gastro-esophageal reflux disease without esophagitis: Secondary | ICD-10-CM | POA: Diagnosis not present

## 2018-05-13 DIAGNOSIS — F17200 Nicotine dependence, unspecified, uncomplicated: Secondary | ICD-10-CM | POA: Insufficient documentation

## 2018-05-13 DIAGNOSIS — Z7982 Long term (current) use of aspirin: Secondary | ICD-10-CM | POA: Insufficient documentation

## 2018-05-13 DIAGNOSIS — I2510 Atherosclerotic heart disease of native coronary artery without angina pectoris: Secondary | ICD-10-CM | POA: Diagnosis not present

## 2018-05-13 DIAGNOSIS — Z9889 Other specified postprocedural states: Secondary | ICD-10-CM | POA: Insufficient documentation

## 2018-05-13 DIAGNOSIS — Z7984 Long term (current) use of oral hypoglycemic drugs: Secondary | ICD-10-CM | POA: Insufficient documentation

## 2018-05-13 DIAGNOSIS — Z79899 Other long term (current) drug therapy: Secondary | ICD-10-CM | POA: Diagnosis not present

## 2018-05-13 DIAGNOSIS — M5126 Other intervertebral disc displacement, lumbar region: Secondary | ICD-10-CM | POA: Diagnosis not present

## 2018-05-13 HISTORY — DX: Essential (primary) hypertension: I10

## 2018-05-13 LAB — CBC
HEMATOCRIT: 35.7 % — AB (ref 36.0–46.0)
HEMOGLOBIN: 10.4 g/dL — AB (ref 12.0–15.0)
MCH: 28.6 pg (ref 26.0–34.0)
MCHC: 29.1 g/dL — ABNORMAL LOW (ref 30.0–36.0)
MCV: 98.1 fL (ref 80.0–100.0)
NRBC: 0 % (ref 0.0–0.2)
Platelets: 260 10*3/uL (ref 150–400)
RBC: 3.64 MIL/uL — AB (ref 3.87–5.11)
RDW: 16.3 % — ABNORMAL HIGH (ref 11.5–15.5)
WBC: 4.4 10*3/uL (ref 4.0–10.5)

## 2018-05-13 LAB — COMPREHENSIVE METABOLIC PANEL
ALBUMIN: 3.4 g/dL — AB (ref 3.5–5.0)
ALT: 16 U/L (ref 0–44)
AST: 25 U/L (ref 15–41)
Alkaline Phosphatase: 110 U/L (ref 38–126)
Anion gap: 7 (ref 5–15)
BILIRUBIN TOTAL: 0.5 mg/dL (ref 0.3–1.2)
BUN: 13 mg/dL (ref 8–23)
CO2: 22 mmol/L (ref 22–32)
CREATININE: 0.78 mg/dL (ref 0.44–1.00)
Calcium: 9.3 mg/dL (ref 8.9–10.3)
Chloride: 113 mmol/L — ABNORMAL HIGH (ref 98–111)
GFR calc Af Amer: >60 mL/min (ref >60)
GLUCOSE: 110 mg/dL — AB (ref 70–99)
Potassium: 4.3 mmol/L (ref 3.5–5.1)
Sodium: 142 mmol/L (ref 135–145)
TOTAL PROTEIN: 7.1 g/dL (ref 6.5–8.1)

## 2018-05-13 LAB — SURGICAL PCR SCREEN
MRSA, PCR: NEGATIVE
Staphylococcus aureus: NEGATIVE

## 2018-05-13 LAB — GLUCOSE, CAPILLARY: Glucose-Capillary: 120 mg/dL — ABNORMAL HIGH (ref 70–99)

## 2018-05-13 NOTE — Progress Notes (Signed)
PCP - Junious Dresser Cardiologist -  Pt hasn't seen a cardiologist in a few years, mentions maybe going to [facility] in [place] prior to knee surgery years ago Sleep study- Dr. Blenda Nicely- requested. Pt wears CPAP at night pressure setting at 11  EKG - 05/13/2018   ECHO - 04/06/17 Cardiac Cath - 9/18  Fasting Blood Sugar - 100-120 normally, A1c drawn today  Blood Thinner Instructions: Last dose of Eliuis and ASA 05/19/18 per patient  Anesthesia review: heart hx, follow up on requested clearance note from Dr. Orvan Falconer  Patient denies shortness of breath, fever, cough and chest pain at PAT appointment   Patient verbalized understanding of instructions that were given to them at the PAT appointment. Patient was also instructed that they will need to review over the PAT instructions again at home before surgery.

## 2018-05-14 LAB — HEMOGLOBIN A1C
Hgb A1c MFr Bld: 5.7 % — ABNORMAL HIGH (ref 4.8–5.6)
MEAN PLASMA GLUCOSE: 117 mg/dL

## 2018-05-15 NOTE — Progress Notes (Addendum)
Anesthesia Chart Review:  Case:  [id number] Date/Time:  05/22/18 1121   Procedure:  MICRODISCECTOMY LUMBAR 2- LUMBAR 3 (Left ) - MICRODISCECTOMY LUMBAR 2- LUMBAR 3   Anesthesia type:  General   Pre-op diagnosis:  LUMBAR HERNIATED DISC   Location:  [facility] ROOM 19 / [facility]   Surgeon:  Tressie Stalker, MD      DISCUSSION: 62 yo female current smoker. 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  Hospitalized 9/12-9/14/18 at [facility] for chest pain (was transferred here from [facility]); cath with nonobstructive CAD.  Preop clearance from PCP 05/12/2018 states to hold ASA and Eluquis 3d preop.  Recently had left THA 12/10/2017 without complication.  Anticipate she can proceed with surgery as planned barring acute status change.  VS: BP (!) 141/64   Pulse 73   Temp 36.7 C   Resp 20   Ht 5\' 6"  (1.676 m)   Wt 129.1 kg   SpO2 100%   BMI 45.94 kg/m   PROVIDERS: Wilmer Floor., MD is PCP  Georgina Quint, PA-C is Gastroenterology provider  LABS: Labs reviewed: Acceptable for surgery. (all labs ordered are listed, but only abnormal results are displayed)  Labs Reviewed  GLUCOSE, CAPILLARY - Abnormal; Notable for the following components:      Result Value   Glucose-Capillary 120 (*)    All other components within normal limits  HEMOGLOBIN A1C - Abnormal; Notable for the following components:   Hgb A1c MFr Bld 5.7 (*)    All other components within normal limits  CBC - Abnormal; Notable for the following components:   RBC 3.64 (*)    Hemoglobin 10.4 (*)    HCT 35.7 (*)    MCHC 29.1 (*)    RDW 16.3 (*)    All other components within normal limits  COMPREHENSIVE METABOLIC PANEL - Abnormal; Notable for the following components:   Chloride 113 (*)    Glucose, Bld 110 (*)    Albumin 3.4 (*)    All other components within normal limits  SURGICAL PCR SCREEN     IMAGES: CXR  04/01/17 (found in correspondence 04/06/17, pg 20, in media tab):  1.  Poor inspiration with only mild right basilar atelectasis.  No definite pneumonia or effusion 2.  Stable borderline cardiomegaly  EKG: 05/13/2018: Normal sinus rhythm. Rate 74.  CV: Cath 04/05/2017:  The left ventricular systolic function is normal. The left ventricular ejection fraction is 55-65% by visual estimate.  LV end diastolic pressure is mildly elevated.  ___________________________________  LM lesion, 30 %stenosed.  Ost LAD lesion, 30 %stenosed. Mid LAD lesion, 45 %stenosed. Dist LAD lesion, 50 %stenosed. - Combined FFR with these lesions was 0.87. Not physiologically significant.  Prox RCA lesion, 30 %stenosed and the remainder of the mid vessel is diffusely diseased but mild. No significant flow-limiting lesions   No flow-limiting lesions takes when the patient several stress test. The 2 lesions in the LAD are angiographically not significant and not significant by FFR.  Either of these 2 lesions could potentially be a focal site for coronary spasm however.  Her stress test would be considered false positive.  Echo 04/02/17 (found in correspondence 04/06/17, pg 21, in media tab):  1.  LV severely dilated.  Borderline concentric LVH.  Overall LV systolic function normal, EF 65-70%. 2.  LA moderately dilated. 3.  Mild aortic valve sclerosis. 4.  Mild tricuspid  regurgitation Past Medical History:  Diagnosis Date  . Arthritis    "pretty much all over" (04/03/2017)  . Asthma   . Autoimmune hepatitis (HCC)   . Coronary artery disease    mild-mod by 2018 cath  . Family history of adverse reaction to anesthesia    "granddaughter has PONV"  . GERD (gastroesophageal reflux disease)   . High cholesterol   . History of blood transfusion    "in [place]; related to knee surgeries"  . History of kidney stones   . Hypertension   . OSA on CPAP    CPAP, pressure settings 11  . Type II diabetes mellitus  (HCC)     Past Surgical History:  Procedure Laterality Date  . ABDOMINAL HYSTERECTOMY    . BACK SURGERY    . CARDIAC CATHETERIZATION    . CARPAL TUNNEL RELEASE Bilateral    "done in [place]"  . CHOLECYSTECTOMY    . INCISION AND DRAINAGE Right 2015   S/P knee revision  . INTRAVASCULAR PRESSURE WIRE/FFR STUDY N/A 04/05/2017   Procedure: INTRAVASCULAR PRESSURE WIRE/FFR STUDY;  Surgeon: Marykay Lex, MD;  Location: [facility] INVASIVE CV LAB;  Service: Cardiovascular;  Laterality: N/A;  . JOINT REPLACEMENT    . LEFT HEART CATH AND CORONARY ANGIOGRAPHY N/A 04/05/2017   Procedure: LEFT HEART CATH AND CORONARY ANGIOGRAPHY;  Surgeon: Marykay Lex, MD;  Location: [facility] INVASIVE CV LAB;  Service: Cardiovascular;  Laterality: N/A;  . POSTERIOR FUSION LUMBAR SPINE  05/2010  . REVISION TOTAL KNEE ARTHROPLASTY Right 2015  . SHOULDER ARTHROSCOPY WITH ROTATOR CUFF REPAIR Right   . SHOULDER OPEN ROTATOR CUFF REPAIR Left    "done in [place]"  . TOTAL HIP ARTHROPLASTY Left 12/10/2017   Procedure: LEFT TOTAL HIP ARTHROPLASTY ANTERIOR APPROACH;  Surgeon: Kathryne Hitch, MD;  Location: [facility];  Service: Orthopedics;  Laterality: Left;  . TOTAL KNEE ARTHROPLASTY Bilateral 1990s    MEDICATIONS: . amLODipine (NORVASC) 5 MG tablet  . albuterol (PROVENTIL) (2.5 MG/3ML) 0.083% nebulizer solution  . apixaban (ELIQUIS) 5 MG TABS tablet  . aspirin EC 81 MG tablet  . azaTHIOprine (IMURAN) 50 MG tablet  . beclomethasone (QVAR) 80 MCG/ACT inhaler  . buPROPion (WELLBUTRIN XL) 150 MG 24 hr tablet  . Choline Fenofibrate (TRILIPIX) 135 MG capsule  . diclofenac sodium (VOLTAREN) 1 % GEL  . fesoterodine (TOVIAZ) 8 MG TB24 tablet  . gabapentin (NEURONTIN) 300 MG capsule  . Ipratropium-Albuterol (COMBIVENT RESPIMAT) 20-100 MCG/ACT AERS respimat  . lisinopril (PRINIVIL,ZESTRIL) 20 MG tablet  . Meclizine HCl 25 MG CHEW  . meloxicam (MOBIC) 7.5 MG tablet  . metFORMIN (GLUCOPHAGE) 500 MG tablet  . metoprolol  tartrate (LOPRESSOR) 50 MG tablet  . Multiple Vitamins-Minerals (MULTIVITAMIN GUMMIES WOMENS PO)  . Oxycodone HCl 10 MG TABS  . pantoprazole (PROTONIX) 40 MG tablet  . tiotropium (SPIRIVA) 18 MCG inhalation capsule  . tiZANidine (ZANAFLEX) 2 MG tablet   No current facility-administered medications for this encounter.

## 2018-05-15 NOTE — Anesthesia Preprocedure Evaluation (Addendum)
Anesthesia Evaluation    Airway Mallampati: III  TM Distance: >3 FB Neck ROM: Limited    Dental no notable dental hx. (+) Teeth Intact, Dental Advisory Given   Pulmonary asthma , sleep apnea and Continuous Positive Airway Pressure Ventilation , Current Smoker,    Pulmonary exam normal breath sounds clear to auscultation       Cardiovascular hypertension, Pt. on home beta blockers and Pt. on medications + angina + CAD and + DVT (on eliquis, last dose 10/27)  Normal cardiovascular exam Rhythm:Regular Rate:Normal  Cath 04/05/2017: The left ventricular systolic function is normal. The left ventricular ejection fraction is 55-65% by visual estimate. LV end diastolic pressure is mildly elevated. LM lesion, 30 %stenosed. Ost LAD lesion, 30 %stenosed. Mid LAD lesion, 45 %stenosed. Dist LAD lesion, 50 %stenosed. - Combined FFR with these lesions was 0.87. Not physiologically significant. Prox RCA lesion, 30 %stenosed and the remainder of the mid vessel is diffusely diseased but mild. No significant flow-limiting lesions No flow-limiting lesions takes when the patient several stress test. The 2 lesions in the LAD are angiographically not significant and not significant by FFR. Either of these 2 lesions could potentially be a focal site for coronary spasm however.  Her stress test would be considered false positive.  Echo 04/02/17 1. LV severely dilated. Borderline concentric LVH. Overall LV systolic function normal,EF 65-70%. 2. LA moderately dilated. 3. Mild aortic valve sclerosis. 4. Mild tricuspid regurgitation   Neuro/Psych negative neurological ROS  negative psych ROS   GI/Hepatic GERD  Medicated,(+) Hepatitis -, Autoimmune  Endo/Other  diabetes, Type 2, Oral Hypoglycemic Agents  Renal/GU      Musculoskeletal  (+) Arthritis , Osteoarthritis,    Abdominal   Peds  Hematology  (+) Blood dyscrasia, anemia ,    Anesthesia Other Findings Lumbar herniated disc  Reproductive/Obstetrics                           Anesthesia Physical Anesthesia Plan  ASA: III  Anesthesia Plan: General   Post-op Pain Management:    Induction: Intravenous  PONV Risk Score and Plan: 2 and Ondansetron and Midazolam  Airway Management Planned: Video Laryngoscope Planned  Additional Equipment:   Intra-op Plan:   Post-operative Plan: Extubation in OR  Informed Consent: I have reviewed the patients History and Physical, chart, labs and discussed the procedure including the risks, benefits and alternatives for the proposed anesthesia with the patient or authorized representative who has indicated his/her understanding and acceptance.   Dental advisory given  Plan Discussed with: CRNA  Anesthesia Plan Comments: (See PAT note written 05/15/2018 by Antionette Poles, PA-C )       Anesthesia Quick Evaluation

## 2018-05-20 ENCOUNTER — Other Ambulatory Visit: Payer: Self-pay | Admitting: Neurosurgery

## 2018-05-21 MED ORDER — VANCOMYCIN HCL 10 G IV SOLR
1500.0000 mg | INTRAVENOUS | Status: AC
  Administered 2018-05-22: 1500 mg via INTRAVENOUS
  Filled 2018-05-21: qty 1500

## 2018-05-22 ENCOUNTER — Ambulatory Visit (HOSPITAL_COMMUNITY): Payer: Medicaid Other | Admitting: Anesthesiology

## 2018-05-22 ENCOUNTER — Encounter (HOSPITAL_COMMUNITY)
Admission: RE | Disposition: A | Discharge status: Skilled Nursing Facility | Payer: Self-pay | Source: Ambulatory Visit | Attending: Neurosurgery

## 2018-05-22 ENCOUNTER — Ambulatory Visit (HOSPITAL_COMMUNITY): Payer: Medicaid Other | Admitting: Physician Assistant

## 2018-05-22 ENCOUNTER — Other Ambulatory Visit: Payer: Self-pay

## 2018-05-22 ENCOUNTER — Ambulatory Visit (HOSPITAL_COMMUNITY): Payer: Medicaid Other

## 2018-05-22 ENCOUNTER — Encounter (HOSPITAL_COMMUNITY): Payer: Self-pay

## 2018-05-22 ENCOUNTER — Ambulatory Visit (HOSPITAL_COMMUNITY)
Admission: RE | Admit: 2018-05-22 | Discharge: 2018-05-27 | Disposition: A | Discharge status: Skilled Nursing Facility | Payer: Medicaid Other | Source: Ambulatory Visit | Attending: Neurosurgery | Admitting: Neurosurgery

## 2018-05-22 DIAGNOSIS — Z23 Encounter for immunization: Secondary | ICD-10-CM | POA: Diagnosis not present

## 2018-05-22 DIAGNOSIS — Z96653 Presence of artificial knee joint, bilateral: Secondary | ICD-10-CM | POA: Diagnosis not present

## 2018-05-22 DIAGNOSIS — Z419 Encounter for procedure for purposes other than remedying health state, unspecified: Secondary | ICD-10-CM

## 2018-05-22 DIAGNOSIS — Z7982 Long term (current) use of aspirin: Secondary | ICD-10-CM | POA: Insufficient documentation

## 2018-05-22 DIAGNOSIS — M5116 Intervertebral disc disorders with radiculopathy, lumbar region: Secondary | ICD-10-CM | POA: Insufficient documentation

## 2018-05-22 DIAGNOSIS — Z79899 Other long term (current) drug therapy: Secondary | ICD-10-CM | POA: Insufficient documentation

## 2018-05-22 DIAGNOSIS — E119 Type 2 diabetes mellitus without complications: Secondary | ICD-10-CM | POA: Insufficient documentation

## 2018-05-22 DIAGNOSIS — Z7984 Long term (current) use of oral hypoglycemic drugs: Secondary | ICD-10-CM | POA: Insufficient documentation

## 2018-05-22 DIAGNOSIS — Z880 Allergy status to penicillin: Secondary | ICD-10-CM | POA: Insufficient documentation

## 2018-05-22 DIAGNOSIS — Z7901 Long term (current) use of anticoagulants: Secondary | ICD-10-CM | POA: Insufficient documentation

## 2018-05-22 DIAGNOSIS — F17210 Nicotine dependence, cigarettes, uncomplicated: Secondary | ICD-10-CM | POA: Insufficient documentation

## 2018-05-22 DIAGNOSIS — Z96642 Presence of left artificial hip joint: Secondary | ICD-10-CM | POA: Insufficient documentation

## 2018-05-22 DIAGNOSIS — I10 Essential (primary) hypertension: Secondary | ICD-10-CM | POA: Insufficient documentation

## 2018-05-22 DIAGNOSIS — G4733 Obstructive sleep apnea (adult) (pediatric): Secondary | ICD-10-CM | POA: Diagnosis not present

## 2018-05-22 DIAGNOSIS — I2510 Atherosclerotic heart disease of native coronary artery without angina pectoris: Secondary | ICD-10-CM | POA: Insufficient documentation

## 2018-05-22 DIAGNOSIS — M5126 Other intervertebral disc displacement, lumbar region: Secondary | ICD-10-CM | POA: Diagnosis present

## 2018-05-22 HISTORY — PX: LUMBAR LAMINECTOMY/DECOMPRESSION MICRODISCECTOMY: SHX5026

## 2018-05-22 LAB — GLUCOSE, CAPILLARY
GLUCOSE-CAPILLARY: 123 mg/dL — AB (ref 70–99)
GLUCOSE-CAPILLARY: 129 mg/dL — AB (ref 70–99)
Glucose-Capillary: 110 mg/dL — ABNORMAL HIGH (ref 70–99)
Glucose-Capillary: 149 mg/dL — ABNORMAL HIGH (ref 70–99)

## 2018-05-22 LAB — HEMOGLOBIN A1C
HEMOGLOBIN A1C: 5.4 % (ref 4.8–5.6)
MEAN PLASMA GLUCOSE: 108.28 mg/dL

## 2018-05-22 LAB — PROTIME-INR
INR: 1.03
PROTHROMBIN TIME: 13.4 s (ref 11.4–15.2)

## 2018-05-22 SURGERY — LUMBAR LAMINECTOMY/DECOMPRESSION MICRODISCECTOMY 1 LEVEL
Anesthesia: General | Laterality: Left

## 2018-05-22 MED ORDER — THROMBIN 5000 UNITS EX SOLR
CUTANEOUS | Status: AC
  Filled 2018-05-22: qty 5000

## 2018-05-22 MED ORDER — TIZANIDINE HCL 4 MG PO TABS
2.0000 mg | ORAL_TABLET | Freq: Three times a day (TID) | ORAL | Status: DC | PRN
  Administered 2018-05-23 – 2018-05-24 (×2): 2 mg via ORAL
  Filled 2018-05-22 (×3): qty 1

## 2018-05-22 MED ORDER — BISACODYL 10 MG RE SUPP: 10.0000 mg | Freq: Every day | RECTAL | Status: DC | PRN

## 2018-05-22 MED ORDER — THROMBIN 5000 UNITS EX SOLR
OROMUCOSAL | Status: DC | PRN
  Administered 2018-05-22: 12:00:00 via TOPICAL

## 2018-05-22 MED ORDER — SODIUM CHLORIDE 0.9% FLUSH: 3.0000 mL | INTRAVENOUS | Status: DC | PRN

## 2018-05-22 MED ORDER — AZATHIOPRINE 50 MG PO TABS
100.0000 mg | ORAL_TABLET | Freq: Every day | ORAL | Status: DC
Start: 2018-05-22 — End: 2018-05-27
  Administered 2018-05-23 – 2018-05-26 (×4): 100 mg via ORAL
  Filled 2018-05-22 (×4): qty 2

## 2018-05-22 MED ORDER — LISINOPRIL 20 MG PO TABS
20.0000 mg | ORAL_TABLET | Freq: Every day | ORAL | Status: DC
  Administered 2018-05-23 – 2018-05-26 (×4): 20 mg via ORAL
  Filled 2018-05-22 (×4): qty 1

## 2018-05-22 MED ORDER — BUDESONIDE 0.25 MG/2ML IN SUSP
0.2500 mg | Freq: Two times a day (BID) | RESPIRATORY_TRACT | Status: DC
  Administered 2018-05-22 – 2018-05-26 (×9): 0.25 mg via RESPIRATORY_TRACT
  Filled 2018-05-22 (×9): qty 2

## 2018-05-22 MED ORDER — HEMOSTATIC AGENTS (NO CHARGE) OPTIME
TOPICAL | Status: DC | PRN
  Administered 2018-05-22: 1 via TOPICAL

## 2018-05-22 MED ORDER — ROCURONIUM BROMIDE 10 MG/ML (PF) SYRINGE
PREFILLED_SYRINGE | INTRAVENOUS | Status: DC | PRN
  Administered 2018-05-22: 50 mg via INTRAVENOUS

## 2018-05-22 MED ORDER — PHENYLEPHRINE 40 MCG/ML (10ML) SYRINGE FOR IV PUSH (FOR BLOOD PRESSURE SUPPORT)
PREFILLED_SYRINGE | INTRAVENOUS | Status: AC
  Filled 2018-05-22: qty 10

## 2018-05-22 MED ORDER — BUPIVACAINE-EPINEPHRINE (PF) 0.5% -1:200000 IJ SOLN
INTRAMUSCULAR | Status: DC | PRN
  Administered 2018-05-22: 10 mL via PERINEURAL

## 2018-05-22 MED ORDER — TIOTROPIUM BROMIDE MONOHYDRATE 18 MCG IN CAPS
18.0000 ug | ORAL_CAPSULE | Freq: Every day | RESPIRATORY_TRACT | Status: DC
  Filled 2018-05-22: qty 5

## 2018-05-22 MED ORDER — ALBUTEROL SULFATE (2.5 MG/3ML) 0.083% IN NEBU: 2.5000 mg | INHALATION_SOLUTION | Freq: Four times a day (QID) | RESPIRATORY_TRACT | Status: DC | PRN

## 2018-05-22 MED ORDER — HYDROMORPHONE HCL 1 MG/ML IJ SOLN
0.2500 mg | INTRAMUSCULAR | Status: DC | PRN
  Administered 2018-05-22 (×2): 0.5 mg via INTRAVENOUS

## 2018-05-22 MED ORDER — BUPIVACAINE-EPINEPHRINE 0.5% -1:200000 IJ SOLN
INTRAMUSCULAR | Status: AC
  Filled 2018-05-22: qty 1

## 2018-05-22 MED ORDER — MIDAZOLAM HCL 2 MG/2ML IJ SOLN
INTRAMUSCULAR | Status: AC
  Filled 2018-05-22: qty 2

## 2018-05-22 MED ORDER — HYDROMORPHONE HCL 1 MG/ML IJ SOLN
INTRAMUSCULAR | Status: AC
  Filled 2018-05-22: qty 1

## 2018-05-22 MED ORDER — SODIUM CHLORIDE 0.9 % IV SOLN
INTRAVENOUS | Status: DC | PRN
  Administered 2018-05-22: 12:00:00

## 2018-05-22 MED ORDER — PHENYLEPHRINE 40 MCG/ML (10ML) SYRINGE FOR IV PUSH (FOR BLOOD PRESSURE SUPPORT)
PREFILLED_SYRINGE | INTRAVENOUS | Status: DC | PRN
  Administered 2018-05-22: 60 ug via INTRAVENOUS

## 2018-05-22 MED ORDER — MECLIZINE HCL 12.5 MG PO TABS: 25.0000 mg | ORAL_TABLET | Freq: Three times a day (TID) | ORAL | Status: DC | PRN

## 2018-05-22 MED ORDER — AMLODIPINE BESYLATE 5 MG PO TABS
5.0000 mg | ORAL_TABLET | Freq: Every day | ORAL | Status: DC
  Administered 2018-05-23 – 2018-05-26 (×4): 5 mg via ORAL
  Filled 2018-05-22 (×5): qty 1

## 2018-05-22 MED ORDER — SODIUM CHLORIDE 0.9 % IV SOLN: 250.0000 mL | INTRAVENOUS | Status: DC

## 2018-05-22 MED ORDER — GLYCOPYRROLATE 0.2 MG/ML IJ SOLN
INTRAMUSCULAR | Status: DC | PRN
  Administered 2018-05-22: 0.2 mg via INTRAVENOUS

## 2018-05-22 MED ORDER — METFORMIN HCL 500 MG PO TABS
500.0000 mg | ORAL_TABLET | Freq: Two times a day (BID) | ORAL | Status: DC
  Administered 2018-05-22 – 2018-05-26 (×9): 500 mg via ORAL
  Filled 2018-05-22 (×9): qty 1

## 2018-05-22 MED ORDER — GABAPENTIN 300 MG PO CAPS
300.0000 mg | ORAL_CAPSULE | Freq: Three times a day (TID) | ORAL | Status: DC
  Administered 2018-05-22 – 2018-05-26 (×14): 300 mg via ORAL
  Filled 2018-05-22 (×14): qty 1

## 2018-05-22 MED ORDER — THROMBIN 5000 UNITS EX SOLR
CUTANEOUS | Status: AC
  Filled 2018-05-22: qty 10000

## 2018-05-22 MED ORDER — METOPROLOL TARTRATE 50 MG PO TABS
50.0000 mg | ORAL_TABLET | Freq: Two times a day (BID) | ORAL | Status: DC
  Administered 2018-05-22 – 2018-05-26 (×9): 50 mg via ORAL
  Filled 2018-05-22 (×9): qty 1

## 2018-05-22 MED ORDER — PANTOPRAZOLE SODIUM 40 MG PO TBEC
40.0000 mg | DELAYED_RELEASE_TABLET | Freq: Every day | ORAL | Status: DC
  Administered 2018-05-23 – 2018-05-26 (×4): 40 mg via ORAL
  Filled 2018-05-22 (×5): qty 1

## 2018-05-22 MED ORDER — THROMBIN 5000 UNITS EX SOLR
CUTANEOUS | Status: DC | PRN
  Administered 2018-05-22 (×2): 5000 [IU] via TOPICAL

## 2018-05-22 MED ORDER — ROCURONIUM BROMIDE 50 MG/5ML IV SOSY
PREFILLED_SYRINGE | INTRAVENOUS | Status: AC
  Filled 2018-05-22: qty 20

## 2018-05-22 MED ORDER — FESOTERODINE FUMARATE ER 8 MG PO TB24
8.0000 mg | ORAL_TABLET | Freq: Every day | ORAL | Status: DC
  Administered 2018-05-22 – 2018-05-26 (×5): 8 mg via ORAL
  Filled 2018-05-22 (×5): qty 1

## 2018-05-22 MED ORDER — OXYCODONE HCL 5 MG PO TABS
ORAL_TABLET | ORAL | Status: AC
  Filled 2018-05-22: qty 2

## 2018-05-22 MED ORDER — MIDAZOLAM HCL 5 MG/5ML IJ SOLN
INTRAMUSCULAR | Status: DC | PRN
  Administered 2018-05-22: 2 mg via INTRAVENOUS

## 2018-05-22 MED ORDER — LIDOCAINE 2% (20 MG/ML) 5 ML SYRINGE
INTRAMUSCULAR | Status: DC | PRN
  Administered 2018-05-22: 80 mg via INTRAVENOUS

## 2018-05-22 MED ORDER — UMECLIDINIUM BROMIDE 62.5 MCG/INH IN AEPB
1.0000 | INHALATION_SPRAY | Freq: Every day | RESPIRATORY_TRACT | Status: DC
  Administered 2018-05-23 – 2018-05-26 (×4): 1 via RESPIRATORY_TRACT
  Filled 2018-05-22: qty 7

## 2018-05-22 MED ORDER — SUGAMMADEX SODIUM 200 MG/2ML IV SOLN
INTRAVENOUS | Status: DC | PRN
  Administered 2018-05-22: 300 mg via INTRAVENOUS

## 2018-05-22 MED ORDER — MELOXICAM 7.5 MG PO TABS
7.5000 mg | ORAL_TABLET | Freq: Every day | ORAL | Status: DC
  Administered 2018-05-23 – 2018-05-26 (×4): 7.5 mg via ORAL
  Filled 2018-05-22 (×4): qty 1

## 2018-05-22 MED ORDER — GLYCOPYRROLATE PF 0.2 MG/ML IJ SOSY
PREFILLED_SYRINGE | INTRAMUSCULAR | Status: AC
  Filled 2018-05-22: qty 1

## 2018-05-22 MED ORDER — ADULT MULTIVITAMIN W/MINERALS CH
1.0000 | ORAL_TABLET | Freq: Every day | ORAL | Status: DC
  Administered 2018-05-23 – 2018-05-26 (×4): 1 via ORAL
  Filled 2018-05-22 (×4): qty 1

## 2018-05-22 MED ORDER — OXYCODONE HCL 5 MG PO TABS: 5.0000 mg | ORAL_TABLET | ORAL | Status: DC | PRN

## 2018-05-22 MED ORDER — DOCUSATE SODIUM 100 MG PO CAPS
100.0000 mg | ORAL_CAPSULE | Freq: Two times a day (BID) | ORAL | Status: DC
  Administered 2018-05-22 – 2018-05-26 (×9): 100 mg via ORAL
  Filled 2018-05-22 (×9): qty 1

## 2018-05-22 MED ORDER — CHLORHEXIDINE GLUCONATE CLOTH 2 % EX PADS: 6.0000 | MEDICATED_PAD | Freq: Once | CUTANEOUS | Status: DC

## 2018-05-22 MED ORDER — PHENOL 1.4 % MT LIQD: 1.0000 | OROMUCOSAL | Status: DC | PRN

## 2018-05-22 MED ORDER — PROPOFOL 10 MG/ML IV BOLUS
INTRAVENOUS | Status: DC | PRN
  Administered 2018-05-22: 140 mg via INTRAVENOUS

## 2018-05-22 MED ORDER — ONDANSETRON HCL 4 MG/2ML IJ SOLN
INTRAMUSCULAR | Status: AC
  Filled 2018-05-22: qty 2

## 2018-05-22 MED ORDER — BACITRACIN ZINC 500 UNIT/GM EX OINT
TOPICAL_OINTMENT | CUTANEOUS | Status: DC | PRN
  Administered 2018-05-22: 1 via TOPICAL

## 2018-05-22 MED ORDER — SODIUM CHLORIDE 0.9% FLUSH
3.0000 mL | Freq: Two times a day (BID) | INTRAVENOUS | Status: DC
  Administered 2018-05-22 – 2018-05-25 (×2): 3 mL via INTRAVENOUS

## 2018-05-22 MED ORDER — ZOLPIDEM TARTRATE 5 MG PO TABS: 5.0000 mg | ORAL_TABLET | Freq: Every evening | ORAL | Status: DC | PRN

## 2018-05-22 MED ORDER — BUPROPION HCL ER (XL) 150 MG PO TB24
150.0000 mg | ORAL_TABLET | Freq: Every day | ORAL | Status: DC
  Administered 2018-05-23 – 2018-05-26 (×4): 150 mg via ORAL
  Filled 2018-05-22 (×4): qty 1

## 2018-05-22 MED ORDER — LIDOCAINE 2% (20 MG/ML) 5 ML SYRINGE
INTRAMUSCULAR | Status: AC
  Filled 2018-05-22: qty 15

## 2018-05-22 MED ORDER — DEXAMETHASONE SODIUM PHOSPHATE 10 MG/ML IJ SOLN
INTRAMUSCULAR | Status: AC
  Filled 2018-05-22: qty 1

## 2018-05-22 MED ORDER — DEXAMETHASONE SODIUM PHOSPHATE 10 MG/ML IJ SOLN
INTRAMUSCULAR | Status: DC | PRN
  Administered 2018-05-22: 10 mg via INTRAVENOUS

## 2018-05-22 MED ORDER — FENOFIBRATE 160 MG PO TABS
160.0000 mg | ORAL_TABLET | Freq: Every day | ORAL | Status: DC
  Administered 2018-05-23 – 2018-05-26 (×4): 160 mg via ORAL
  Filled 2018-05-22 (×4): qty 1

## 2018-05-22 MED ORDER — 0.9 % SODIUM CHLORIDE (POUR BTL) OPTIME
TOPICAL | Status: DC | PRN
  Administered 2018-05-22: 1000 mL

## 2018-05-22 MED ORDER — ONDANSETRON HCL 4 MG/2ML IJ SOLN: 4.0000 mg | Freq: Four times a day (QID) | INTRAMUSCULAR | Status: DC | PRN

## 2018-05-22 MED ORDER — BACITRACIN ZINC 500 UNIT/GM EX OINT
TOPICAL_OINTMENT | CUTANEOUS | Status: AC
  Filled 2018-05-22: qty 28.35

## 2018-05-22 MED ORDER — IPRATROPIUM-ALBUTEROL 0.5-2.5 (3) MG/3ML IN SOLN: 3.0000 mL | Freq: Four times a day (QID) | RESPIRATORY_TRACT | Status: DC | PRN

## 2018-05-22 MED ORDER — OXYCODONE HCL 5 MG PO TABS
10.0000 mg | ORAL_TABLET | ORAL | Status: DC | PRN
  Administered 2018-05-22 – 2018-05-26 (×16): 10 mg via ORAL
  Filled 2018-05-22 (×15): qty 2

## 2018-05-22 MED ORDER — MENTHOL 3 MG MT LOZG: 1.0000 | LOZENGE | OROMUCOSAL | Status: DC | PRN

## 2018-05-22 MED ORDER — ONDANSETRON HCL 4 MG/2ML IJ SOLN
INTRAMUSCULAR | Status: DC | PRN
  Administered 2018-05-22: 4 mg via INTRAVENOUS

## 2018-05-22 MED ORDER — ONDANSETRON HCL 4 MG PO TABS: 4.0000 mg | ORAL_TABLET | Freq: Four times a day (QID) | ORAL | Status: DC | PRN

## 2018-05-22 MED ORDER — FENTANYL CITRATE (PF) 250 MCG/5ML IJ SOLN
INTRAMUSCULAR | Status: DC | PRN
  Administered 2018-05-22: 50 ug via INTRAVENOUS
  Administered 2018-05-22: 100 ug via INTRAVENOUS

## 2018-05-22 MED ORDER — LACTATED RINGERS IV SOLN
INTRAVENOUS | Status: DC
  Administered 2018-05-22 (×2): via INTRAVENOUS

## 2018-05-22 MED ORDER — VANCOMYCIN HCL 10 G IV SOLR
1250.0000 mg | Freq: Once | INTRAVENOUS | Status: AC
  Administered 2018-05-22: 1250 mg via INTRAVENOUS
  Filled 2018-05-22: qty 1250

## 2018-05-22 MED ORDER — FENTANYL CITRATE (PF) 250 MCG/5ML IJ SOLN
INTRAMUSCULAR | Status: AC
  Filled 2018-05-22: qty 5

## 2018-05-22 MED ORDER — INSULIN ASPART 100 UNIT/ML ~~LOC~~ SOLN
0.0000 [IU] | SUBCUTANEOUS | Status: DC
  Administered 2018-05-22 – 2018-05-23 (×4): 3 [IU] via SUBCUTANEOUS
  Administered 2018-05-23: 4 [IU] via SUBCUTANEOUS
  Administered 2018-05-23: 3 [IU] via SUBCUTANEOUS

## 2018-05-22 MED ORDER — MORPHINE SULFATE (PF) 4 MG/ML IV SOLN
4.0000 mg | INTRAVENOUS | Status: DC | PRN
  Administered 2018-05-22: 4 mg via INTRAVENOUS
  Filled 2018-05-22 (×2): qty 1

## 2018-05-22 SURGICAL SUPPLY — 50 items
BAG DECANTER FOR FLEXI CONT (MISCELLANEOUS) ×3 IMPLANT
BENZOIN TINCTURE PRP APPL 2/3 (GAUZE/BANDAGES/DRESSINGS) ×3 IMPLANT
BLADE CLIPPER SURG (BLADE) IMPLANT
BUR MATCHSTICK NEURO 3.0 LAGG (BURR) ×3 IMPLANT
BUR PRECISION FLUTE 6.0 (BURR) ×3 IMPLANT
CANISTER SUCT 3000ML PPV (MISCELLANEOUS) ×3 IMPLANT
CARTRIDGE OIL MAESTRO DRILL (MISCELLANEOUS) ×1 IMPLANT
CLOSURE WOUND 1/2 X4 (GAUZE/BANDAGES/DRESSINGS) ×1
COVER WAND RF STERILE (DRAPES) ×3 IMPLANT
DERMABOND ADVANCED (GAUZE/BANDAGES/DRESSINGS) ×2
DERMABOND ADVANCED .7 DNX12 (GAUZE/BANDAGES/DRESSINGS) ×1 IMPLANT
DIFFUSER DRILL AIR PNEUMATIC (MISCELLANEOUS) ×3 IMPLANT
DRAPE LAPAROTOMY 100X72X124 (DRAPES) ×3 IMPLANT
DRAPE MICROSCOPE LEICA (MISCELLANEOUS) ×3 IMPLANT
DRAPE POUCH INSTRU U-SHP 10X18 (DRAPES) ×3 IMPLANT
DRAPE SURG 17X23 STRL (DRAPES) ×12 IMPLANT
DRSG OPSITE POSTOP 4X6 (GAUZE/BANDAGES/DRESSINGS) ×3 IMPLANT
ELECT BLADE 4.0 EZ CLEAN MEGAD (MISCELLANEOUS) ×3
ELECT REM PT RETURN 9FT ADLT (ELECTROSURGICAL) ×3
ELECTRODE BLDE 4.0 EZ CLN MEGD (MISCELLANEOUS) ×1 IMPLANT
ELECTRODE REM PT RTRN 9FT ADLT (ELECTROSURGICAL) ×1 IMPLANT
GAUZE 4X4 16PLY RFD (DISPOSABLE) IMPLANT
GAUZE SPONGE 4X4 12PLY STRL (GAUZE/BANDAGES/DRESSINGS) ×3 IMPLANT
GLOVE BIO SURGEON STRL SZ8 (GLOVE) ×3 IMPLANT
GLOVE BIO SURGEON STRL SZ8.5 (GLOVE) ×3 IMPLANT
GLOVE EXAM NITRILE XL STR (GLOVE) IMPLANT
GOWN STRL REUS W/ TWL LRG LVL3 (GOWN DISPOSABLE) ×3 IMPLANT
GOWN STRL REUS W/ TWL XL LVL3 (GOWN DISPOSABLE) ×2 IMPLANT
GOWN STRL REUS W/TWL 2XL LVL3 (GOWN DISPOSABLE) IMPLANT
GOWN STRL REUS W/TWL LRG LVL3 (GOWN DISPOSABLE) ×6
GOWN STRL REUS W/TWL XL LVL3 (GOWN DISPOSABLE) ×4
HEMOSTAT POWDER SURGIFOAM 1G (HEMOSTASIS) ×3 IMPLANT
KIT BASIN OR (CUSTOM PROCEDURE TRAY) ×3 IMPLANT
KIT TURNOVER KIT B (KITS) ×3 IMPLANT
NEEDLE HYPO 21X1.5 SAFETY (NEEDLE) IMPLANT
NEEDLE HYPO 22GX1.5 SAFETY (NEEDLE) ×3 IMPLANT
NS IRRIG 1000ML POUR BTL (IV SOLUTION) ×3 IMPLANT
OIL CARTRIDGE MAESTRO DRILL (MISCELLANEOUS) ×3
PACK LAMINECTOMY NEURO (CUSTOM PROCEDURE TRAY) ×3 IMPLANT
PAD ARMBOARD 7.5X6 YLW CONV (MISCELLANEOUS) ×9 IMPLANT
PATTIES SURGICAL .5 X1 (DISPOSABLE) IMPLANT
RUBBERBAND STERILE (MISCELLANEOUS) ×6 IMPLANT
SPONGE SURGIFOAM ABS GEL SZ50 (HEMOSTASIS) ×3 IMPLANT
STRIP CLOSURE SKIN 1/2X4 (GAUZE/BANDAGES/DRESSINGS) ×2 IMPLANT
SUT VIC AB 1 CT1 18XBRD ANBCTR (SUTURE) ×1 IMPLANT
SUT VIC AB 1 CT1 8-18 (SUTURE) ×2
SUT VIC AB 2-0 CP2 18 (SUTURE) ×3 IMPLANT
TOWEL GREEN STERILE (TOWEL DISPOSABLE) ×3 IMPLANT
TOWEL GREEN STERILE FF (TOWEL DISPOSABLE) ×3 IMPLANT
WATER STERILE IRR 1000ML POUR (IV SOLUTION) ×3 IMPLANT

## 2018-05-22 NOTE — Anesthesia Postprocedure Evaluation (Signed)
Anesthesia Post Note  Patient: Nancy Simmons  Procedure(s) Performed: MICRODISCECTOMY LUMBAR TWOLUMBAR THREE (Left )     Patient location during evaluation: PACU Anesthesia Type: General Level of consciousness: awake and alert Pain management: pain level controlled Vital Signs Assessment: post-procedure vital signs reviewed and stable Respiratory status: spontaneous breathing, nonlabored ventilation, respiratory function stable and patient connected to nasal cannula oxygen Cardiovascular status: blood pressure returned to baseline and stable Postop Assessment: no apparent nausea or vomiting Anesthetic complications: no    Last Vitals:  Vitals:   05/22/18 1545 05/22/18 1612  BP: (!) 150/76   Pulse: 69   Resp: 17   Temp:  (!) 36.4 C  SpO2: 96%     Last Pain:  Vitals:   05/22/18 1612  TempSrc:   PainSc: 3                  Nancy Simmons

## 2018-05-22 NOTE — Anesthesia Procedure Notes (Signed)
Procedure Name: Intubation Date/Time: 05/22/2018 12:20 PM Performed by: Marena Chancy, CRNA Pre-anesthesia Checklist: Patient identified, Emergency Drugs available, Suction available and Patient being monitored Patient Re-evaluated:Patient Re-evaluated prior to induction Oxygen Delivery Method: Circle System Utilized Preoxygenation: Pre-oxygenation with 100% oxygen Induction Type: IV induction Ventilation: Mask ventilation without difficulty Laryngoscope Size: Miller and 2 Grade View: Grade II Tube type: Oral Tube size: 7.5 mm Number of attempts: 1 Airway Equipment and Method: Stylet and Oral airway Placement Confirmation: ETT inserted through vocal cords under direct vision,  positive ETCO2 and breath sounds checked- equal and bilateral Tube secured with: Tape Dental Injury: Teeth and Oropharynx as per pre-operative assessment

## 2018-05-22 NOTE — Progress Notes (Signed)
Subjective: The patient is alert and pleasant.  She is in no apparent distress.  Objective: Vital signs in last 24 hours: Temp:  [97.8 F (36.6 C)] 97.8 F (36.6 C) (10/31 1358) Pulse Rate:  [64-73] 73 (10/31 1445) Resp:  [18-20] 18 (10/31 1445) BP: (144-157)/(70-74) 144/72 (10/31 1445) SpO2:  [93 %-100 %] 93 % (10/31 1445) Weight:  [129.1 kg] 129.1 kg (10/31 1107) Estimated body mass index is 45.94 kg/m as calculated from the following:   Height as of this encounter: 5\' 6"  (1.676 m).   Weight as of this encounter: 129.1 kg.   Intake/Output from previous day: No intake/output data recorded. Intake/Output this shift: Total I/O In: 1000 [I.V.:1000] Out: 50 [Blood:50]  Physical exam the patient is alert and pleasant.  She is moving her lower extremities well.  Lab Results: No results for input(s): WBC, HGB, HCT, PLT in the last 72 hours. BMET No results for input(s): NA, K, CL, CO2, GLUCOSE, BUN, CREATININE, CALCIUM in the last 72 hours.  Studies/Results: Dg Lumbar Spine 1 View  Result Date: 05/22/2018 CLINICAL DATA:  L2-3 micro discectomy. EXAM: LUMBAR SPINE - 1 VIEW COMPARISON:  Radiographs of April 04, 2018. MRI of April 11, 2018. FINDINGS: Single intraoperative cross-table lateral projection of the lumbar spine demonstrates a surgical probe directed toward the posterior elements of L2. IMPRESSION: Surgical localization as described above. Electronically Signed   By: Lupita Raider, M.D.   On: 05/22/2018 14:40    Assessment/Plan: The patient is doing well.  LOS: 0 days     Cristi Loron 05/22/2018, 2:59 PM

## 2018-05-22 NOTE — H&P (Signed)
Subjective: The patient is a 62 year old white female whose had multiple prior surgeries.  She has had an L4-5 fusion.  She is developed back and left leg pain consistent with a lumbar radiculopathy.  She has failed medical management.  She was worked up with a lumbar MRI which demonstrated a herniated disc at L2-3 on the left.  I discussed the various treatment options with the patient including surgery.  She has decided to proceed with a left L2-3 discectomy.  Past Medical History:  Diagnosis Date  . Arthritis    "pretty much all over" (04/03/2017)  . Asthma   . Autoimmune hepatitis (HCC)   . Coronary artery disease    mild-mod by 2018 cath  . Family history of adverse reaction to anesthesia    "granddaughter has PONV"  . GERD (gastroesophageal reflux disease)   . High cholesterol   . History of blood transfusion    "in [place]; related to knee surgeries"  . History of kidney stones   . Hypertension   . OSA on CPAP    CPAP, pressure settings 11  . Type II diabetes mellitus (HCC)     Past Surgical History:  Procedure Laterality Date  . ABDOMINAL HYSTERECTOMY    . BACK SURGERY    . CARDIAC CATHETERIZATION    . CARPAL TUNNEL RELEASE Bilateral    "done in [place]"  . CHOLECYSTECTOMY    . INCISION AND DRAINAGE Right 2015   S/P knee revision  . INTRAVASCULAR PRESSURE WIRE/FFR STUDY N/A 04/05/2017   Procedure: INTRAVASCULAR PRESSURE WIRE/FFR STUDY;  Surgeon: Marykay Lex, MD;  Location: [facility] INVASIVE CV LAB;  Service: Cardiovascular;  Laterality: N/A;  . JOINT REPLACEMENT    . LEFT HEART CATH AND CORONARY ANGIOGRAPHY N/A 04/05/2017   Procedure: LEFT HEART CATH AND CORONARY ANGIOGRAPHY;  Surgeon: Marykay Lex, MD;  Location: [facility] INVASIVE CV LAB;  Service: Cardiovascular;  Laterality: N/A;  . POSTERIOR FUSION LUMBAR SPINE  05/2010  . REVISION TOTAL KNEE ARTHROPLASTY Right 2015  . SHOULDER ARTHROSCOPY WITH ROTATOR CUFF REPAIR Right   . SHOULDER OPEN ROTATOR CUFF REPAIR Left    "done in [place]"  . TOTAL HIP ARTHROPLASTY Left 12/10/2017   Procedure: LEFT TOTAL HIP ARTHROPLASTY ANTERIOR APPROACH;  Surgeon: Kathryne Hitch, MD;  Location: [facility] OR;  Service: Orthopedics;  Laterality: Left;  . TOTAL KNEE ARTHROPLASTY Bilateral 1990s    Allergies  Allergen Reactions  . Acetaminophen Other (See Comments)    PATIENT HAS AUTOIMMUNE HEPATITIS PATIENT IS * NOT * TO RECEIVE ANY ACETAMINOPHEN  . Penicillin G Rash and Other (See Comments)    Has patient had a PCN reaction causing immediate rash, facial/tongue/throat swelling, SOB or lightheadedness with hypotension: No Has patient had a PCN reaction causing severe rash involving mucus membranes or skin necrosis: No Has patient had a PCN reaction that required hospitalization: No Has patient had a PCN reaction occurring within the last 10 years: #  #  #  YES  #  #  #     Social History   Tobacco Use  . Smoking status: Current Every Day Smoker    Packs/day: 0.50    Years: 44.00    Pack years: 22.00    Types: Cigarettes    Start date: 07/23/1972    Last attempt to quit: 12/06/2017    Years since quitting: 0.4  . Smokeless tobacco: Never Used  . Tobacco comment: 1 pack of cigarettes every 3 days  Substance Use Topics  . Alcohol  use: No    Alcohol/week: 0.0 standard drinks    Family History  Problem Relation Age of Onset  . CAD Mother   . CAD Father   . Diabetes Father   . CAD Brother   . Diabetes Brother   . Colon cancer Other   . Stroke Other    Prior to Admission medications   Medication Sig Start Date End Date Taking? Authorizing Provider  albuterol (PROVENTIL) (2.5 MG/3ML) 0.083% nebulizer solution Take 2.5 mg by nebulization every 6 (six) hours as needed for wheezing or shortness of breath.   Yes [provider]  amLODipine (NORVASC) 5 MG tablet Take 5 mg by mouth daily.   Yes [provider]  apixaban (ELIQUIS) 5 MG TABS tablet Take 5 mg by mouth 2 (two) times daily.   Yes [provider]  aspirin EC 81 MG tablet Take 81 mg by mouth daily.   Yes [provider]  azaTHIOprine (IMURAN) 50 MG tablet Take 100 mg by mouth daily.   Yes [provider]  beclomethasone (QVAR) 80 MCG/ACT inhaler Inhale 2 puffs into the lungs 2 (two) times daily.    Yes [provider]  buPROPion (WELLBUTRIN XL) 150 MG 24 hr tablet Take 150 mg by mouth daily.   Yes [provider]  Choline Fenofibrate (TRILIPIX) 135 MG capsule Take 135 mg by mouth daily.   Yes [provider]  diclofenac sodium (VOLTAREN) 1 % GEL Apply 1 application topically 4 (four) times daily as needed (for pain).   Yes [provider]  fesoterodine (TOVIAZ) 8 MG TB24 tablet Take 8 mg by mouth daily.   Yes [provider]  gabapentin (NEURONTIN) 300 MG capsule Take 300 mg by mouth 3 (three) times daily.    Yes [provider]  lisinopril (PRINIVIL,ZESTRIL) 20 MG tablet Take 20 mg by mouth daily.   Yes [provider]  Meclizine HCl 25 MG CHEW Chew 25 mg by mouth 3 (three) times daily as needed for dizziness.    Yes [provider]  meloxicam (MOBIC) 7.5 MG tablet Take 7.5 mg by mouth daily.   Yes [provider]  metFORMIN (GLUCOPHAGE) 500 MG tablet Take 500 mg by mouth 2 (two) times daily with a meal.    Yes [provider]  metoprolol tartrate (LOPRESSOR) 50 MG tablet Take 50 mg by mouth 2 (two) times daily.    Yes [provider]  Multiple Vitamins-Minerals (MULTIVITAMIN GUMMIES WOMENS PO) Take 2 each by mouth daily.   Yes [provider]  Oxycodone HCl 10 MG TABS Take 10 mg by mouth 3 (three) times daily as needed (for severe pain).    Yes [provider]  pantoprazole (PROTONIX) 40 MG tablet Take 40 mg by mouth daily.   Yes [provider]  tiotropium (SPIRIVA) 18 MCG inhalation capsule Place 18 mcg into inhaler and inhale daily.   Yes [provider]  tiZANidine  (ZANAFLEX) 2 MG tablet TAKE ONE TABLET BY MOUTH EVERY 8 HOURS AS NEEDED FOR MUSCLE SPASMS Patient taking differently: Take 2 mg by mouth every 8 (eight) hours as needed for muscle spasms.  04/29/18  Yes Kathryne Hitch, MD  Ipratropium-Albuterol (COMBIVENT RESPIMAT) 20-100 MCG/ACT AERS respimat Inhale 1 puff into the lungs every 6 (six) hours as needed for wheezing or shortness of breath.     [provider]     Review of Systems  Positive ROS: As above  All other systems have  been reviewed and were otherwise negative with the exception of those mentioned in the HPI and as above.  Objective: Vital signs in last 24 hours: Temp:  [97.8 F (36.6 C)] 97.8 F (36.6 C) (10/31 1014) Pulse Rate:  [64] 64 (10/31 1014) Resp:  [20] 20 (10/31 1014) BP: (157)/(71) 157/71 (10/31 1014) SpO2:  [100 %] 100 % (10/31 1014) Weight:  [129.1 kg] 129.1 kg (10/31 1107) Estimated body mass index is 45.94 kg/m as calculated from the following:   Height as of this encounter: 5\' 6"  (1.676 m).   Weight as of this encounter: 129.1 kg.   General Appearance: Alert Head: Normocephalic, without obvious abnormality, atraumatic Eyes: PERRL, conjunctiva/corneas clear, EOM's intact,    Ears: Normal  Throat: Normal  Neck: Supple, Back: The patient's lumbar incision is well-healed.  Straight leg raise testing is positive on the left. Lungs: Clear to auscultation bilaterally, respirations unlabored Heart: Regular rate and rhythm, no murmur, rub or gallop Abdomen: Soft, non-tender Extremities: Extremities normal, atraumatic, no cyanosis or edema Skin: unremarkable  NEUROLOGIC:   Mental status: alert and oriented,Motor Exam - grossly normal Sensory Exam - grossly normal Reflexes:  Coordination - grossly normal Gait - grossly normal Balance - grossly normal Cranial Nerves: I: smell Not tested  II: visual acuity  OS: Normal  OD: Normal   II: visual fields Full to confrontation  II: pupils  Equal, round, reactive to light  III,VII: ptosis None  III,IV,VI: extraocular muscles  Full ROM  V: mastication Normal  V: facial light touch sensation  Normal  V,VII: corneal reflex  Present  VII: facial muscle function - upper  Normal  VII: facial muscle function - lower Normal  VIII: hearing Not tested  IX: soft palate elevation  Normal  IX,X: gag reflex Present  XI: trapezius strength  5/5  XI: sternocleidomastoid strength 5/5  XI: neck flexion strength  5/5  XII: tongue strength  Normal    Data Review Lab Results  Component Value Date   WBC 4.4 05/13/2018   HGB 10.4 (L) 05/13/2018   HCT 35.7 (L) 05/13/2018   MCV 98.1 05/13/2018   PLT 260 05/13/2018   Lab Results  Component Value Date   NA 142 05/13/2018   K 4.3 05/13/2018   CL 113 (H) 05/13/2018   CO2 22 05/13/2018   BUN 13 05/13/2018   CREATININE 0.78 05/13/2018   GLUCOSE 110 (H) 05/13/2018   Lab Results  Component Value Date   INR 1.03 05/22/2018    Assessment/Plan: Left L2-3 herniated disc, lumbar radiculopathy, lumbago: I have discussed the situation with the patient.  I have reviewed her imaging studies with her and pointed out the abnormalities.  We have discussed the various treatment options including surgery.  I have described the surgical treatment option of the left L2-3 discectomy.  I have shown her surgical models.  I have given her surgical pamphlet.  We have discussed the risks, benefits, alternatives, expected postoperative course, and likelihood of achieving our goals with surgery.  I have answered all her questions.  She has decided to proceed with surgery.   Cristi Loron 05/22/2018 11:27 AM

## 2018-05-22 NOTE — Progress Notes (Signed)
Pt home CPAP set up and ready to use when pt wants to put on. Sterile water added

## 2018-05-22 NOTE — Transfer of Care (Signed)
Immediate Anesthesia Transfer of Care Note  Patient: Nancy Simmons  Procedure(s) Performed: MICRODISCECTOMY LUMBAR TWOLUMBAR THREE (Left )  Patient Location: PACU  Anesthesia Type:General  Level of Consciousness: awake, alert  and oriented  Airway & Oxygen Therapy: Patient Spontanous Breathing and Patient connected to nasal cannula oxygen  Post-op Assessment: Report given to RN, Post -op Vital signs reviewed and stable and Patient moving all extremities X 4  Post vital signs: Reviewed and stable  Last Vitals:  Vitals Value Taken Time  BP 150/70 05/22/2018  1:58 PM  Temp    Pulse 70 05/22/2018  2:02 PM  Resp 15 05/22/2018  2:02 PM  SpO2 98 % 05/22/2018  2:02 PM  Vitals shown include unvalidated device data.  Last Pain:  Vitals:   05/22/18 1058  TempSrc:   PainSc: 7          Complications: No apparent anesthesia complications

## 2018-05-22 NOTE — Op Note (Signed)
Brief history: The patient is a 62 year old morbidly obese black female who has complained of back and left leg pain consistent with a lumbar radiculopathy.  She has failed medical management and was worked up with a lumbar MRI.  This demonstrated a herniated disc at L2-3 on the left.  I discussed the various treatment options with the patient including surgery.  She has weighed the risks, benefits, and alternatives surgery and decided proceed with a left L2-3 discectomy.  Preoperative diagnosis: Left L2-3 herniated disc, lumbar radiculopathy, lumbago  Postoperative diagnosis: The same  Procedure: Left L2-3 discectomy using micro-dissection  Surgeon: Dr. Delma Officer  Asst.: None  Anesthesia: Gen. endotracheal  Estimated blood loss: 75 cc  Drains: None  Complications: None  Description of procedure: The patient was brought to the operating room by the anesthesia team. General endotracheal anesthesia was induced. The patient was turned to the prone position on the Wilson frame. The patient's lumbosacral region was then prepared with Betadine scrub and Betadine solution. Sterile drapes were applied.  I then injected the area to be incised with Marcaine with epinephrine solution. I then used a scalpel to make a linear midline incision over the L2-3 intervertebral disc space. I then used electrocautery to perform a left sided subperiosteal dissection exposing the spinous process and lamina of L2 and 3. We obtained intraoperative radiograph to confirm our location. I then inserted the [facility] retractor for exposure.  We then brought the operative microscope into the field. Under its magnification and illumination we completed the microdissection. I used a high-speed drill to perform a laminotomy at L2-3 on the left. I then used a Kerrison punches to widen the laminotomy and removed the ligamentum flavum at L2-3 on the left. We then used microdissection to free up the thecal sac and the L3 nerve  root from the epidural tissue. I then used a Kerrison punch to perform a foraminotomy at about the left L3 nerve root. We then using the nerve root retractor to gently retract the thecal sac and the L3 nerve root medially. This exposed the intervertebral disc. We identified the ruptured disc and remove it with the pituitary forceps.  We inspected  the intervertebral disc.  There were no impending herniations and we therefore did not perform an intervertebral discectomy.  I then palpated along the ventral surface of the thecal sac and along exit route of the L3 nerve root and noted that the neural structures were well decompressed. This completed the decompression.  We then obtained hemostasis using bipolar electrocautery. We irrigated the wound out with bacitracin solution. We then removed the retractor. We then reapproximated the patient's thoracolumbar fascia with interrupted #1 Vicryl suture. We then reapproximated the patient's subcutaneous tissue with interrupted 2-0 Vicryl suture. We then reapproximated patient's skin with Steri-Strips and benzoin. The was then coated with bacitracin ointment. The drapes were removed. The patient was subsequently returned to the supine position where they were extubated by the anesthesia team. The patient was then transported to the postanesthesia care unit in stable condition. All sponge instrument and needle counts were reportedly correct at the end of this case.

## 2018-05-22 NOTE — Progress Notes (Signed)
Pharmacy Antibiotic Note  Nancy Simmons is a 61 y.o. female admitted on 05/22/2018 with surgical prophylaxis.  Pharmacy has been consulted for vancomycin dosing.  Patient has no drain charted and the Op note states no drains placed.  Plan: Vancomycin 1250 mg IV x 1 dose, 12 hours after intra-op dose. Pharmacy to sign off.   Height: 5\' 6"  (167.6 cm) Weight: 284 lb 9.6 oz (129.1 kg) IBW/kg (Calculated) : 59.3  Temp (24hrs), Avg:97.7 F (36.5 C), Min:97.5 F (36.4 C), Max:97.8 F (36.6 C)  No results for input(s): WBC, CREATININE, LATICACIDVEN, VANCOTROUGH, VANCOPEAK, VANCORANDOM, GENTTROUGH, GENTPEAK, GENTRANDOM, TOBRATROUGH, TOBRAPEAK, TOBRARND, AMIKACINPEAK, AMIKACINTROU, AMIKACIN in the last 168 hours.  Estimated Creatinine Clearance: 99.1 mL/min (by C-G formula based on SCr of 0.78 mg/dL).    Allergies  Allergen Reactions  . Acetaminophen Other (See Comments)    PATIENT HAS AUTOIMMUNE HEPATITIS PATIENT IS * NOT * TO RECEIVE ANY ACETAMINOPHEN  . Penicillin G Rash and Other (See Comments)    Has patient had a PCN reaction causing immediate rash, facial/tongue/throat swelling, SOB or lightheadedness with hypotension: No Has patient had a PCN reaction causing severe rash involving mucus membranes or skin necrosis: No Has patient had a PCN reaction that required hospitalization: No Has patient had a PCN reaction occurring within the last 10 years: #  #  #  YES  #  #  #      Thank you for allowing [place] to participate in this patients care.   Signe Colt, PharmD Please utilize [employer] (under [facility]) for appropriate number for your unit pharmacist. 05/22/2018 4:51 PM

## 2018-05-23 ENCOUNTER — Encounter (HOSPITAL_COMMUNITY): Payer: Self-pay | Admitting: Neurosurgery

## 2018-05-23 DIAGNOSIS — M5116 Intervertebral disc disorders with radiculopathy, lumbar region: Secondary | ICD-10-CM | POA: Diagnosis not present

## 2018-05-23 LAB — GLUCOSE, CAPILLARY
GLUCOSE-CAPILLARY: 100 mg/dL — AB (ref 70–99)
GLUCOSE-CAPILLARY: 124 mg/dL — AB (ref 70–99)
GLUCOSE-CAPILLARY: 166 mg/dL — AB (ref 70–99)
Glucose-Capillary: 136 mg/dL — ABNORMAL HIGH (ref 70–99)
Glucose-Capillary: 123 mg/dL — ABNORMAL HIGH (ref 70–99)

## 2018-05-23 LAB — HEMOGLOBIN A1C
Hgb A1c MFr Bld: 5.3 % (ref 4.8–5.6)
MEAN PLASMA GLUCOSE: 105.41 mg/dL

## 2018-05-23 MED ORDER — TIZANIDINE HCL 2 MG PO TABS: 2.0000 mg | ORAL_TABLET | Freq: Three times a day (TID) | ORAL | 0 refills | Status: DC | PRN

## 2018-05-23 MED ORDER — INSULIN ASPART 100 UNIT/ML ~~LOC~~ SOLN
0.0000 [IU] | Freq: Three times a day (TID) | SUBCUTANEOUS | Status: DC
  Administered 2018-05-25: 4 [IU] via SUBCUTANEOUS
  Administered 2018-05-26: 3 [IU] via SUBCUTANEOUS

## 2018-05-23 MED ORDER — OXYCODONE HCL 10 MG PO TABS: 10.0000 mg | ORAL_TABLET | ORAL | 0 refills | Status: DC | PRN

## 2018-05-23 NOTE — Care Management Note (Signed)
Case Management Note  Patient Details  Name: LYSHA SCHRADE MRN: [id number] Date of Birth: 07-20-1956  Subjective/Objective:    Pt s/p L2-3 microdisc. She is from home alone. Pt has aide 2 hours a day.  No issues obtaining her medications and granddaughter helps with transportation.                Action/Plan: MD discharged patient home today. Bedside RN spoke to Dr Arnoldo Morale and he agreed for her to stay until am. CM met with the patient and she states her granddaughter can provide transportation home in the am. Pt would like to be d/ced in the am before lunch. Bedside RN updated.  No f/u per PT and no DME needs.  Expected Discharge Date:  05/23/18               Expected Discharge Plan:  Home/Self Care  In-House Referral:     Discharge planning Services     Post Acute Care Choice:    Choice offered to:     DME Arranged:    DME Agency:     HH Arranged:    HH Agency:     Status of Service:  In process, will continue to follow  If discussed at Long Length of Stay Meetings, dates discussed:    Additional Comments:  Pollie Friar, RN 05/23/2018, 12:31 PM

## 2018-05-23 NOTE — Discharge Summary (Addendum)
Physician Discharge Summary  Patient ID: Nancy Simmons MRN: [id number] DOB/AGE: 1956-04-15 62 y.o.  Admit date: 05/22/2018 Discharge date: 05/26/2018  Admission Diagnoses: Lumbar disc, lumbar radiculopathy, lumbago  Discharge Diagnoses: The same Active Problems:   Lumbar herniated disc   Discharged Condition: good  Hospital Course: Patient admitted for the below procedure.  The patient's postoperative course was unremarkable.  On postoperative day #1 she requested discharge to home.  She was given written and oral discharge instructions.  All her questions were answered.  Consults: Physical therapy Significant Diagnostic Studies: None Treatments: L2-3 discectomy using microdissection Discharge Exam: Blood pressure 135/76, pulse 61, temperature 98.4 F (36.9 C), temperature source Oral, resp. rate 20, height 5\' 6"  (1.676 m), weight 129.1 kg, SpO2 96 %. Patient is alert and pleasant.  She looks well.  She is moving her lower extremities well.  Disposition: SNF  Discharge Instructions    Call MD for:  difficulty breathing, headache or visual disturbances   Complete by:  As directed    Call MD for:  extreme fatigue   Complete by:  As directed    Call MD for:  hives   Complete by:  As directed    Call MD for:  persistant dizziness or light-headedness   Complete by:  As directed    Call MD for:  persistant nausea and vomiting   Complete by:  As directed    Call MD for:  redness, tenderness, or signs of infection (pain, swelling, redness, odor or green/yellow discharge around incision site)   Complete by:  As directed    Call MD for:  severe uncontrolled pain   Complete by:  As directed    Call MD for:  temperature >100.4   Complete by:  As directed    Diet - low sodium heart healthy   Complete by:  As directed    Discharge instructions   Complete by:  As directed    Call [phone] for a followup appointment. Take a stool softener while you are using pain medications.    Driving Restrictions   Complete by:  As directed    Do not drive for 2 weeks.   Increase activity slowly   Complete by:  As directed    Lifting restrictions   Complete by:  As directed    Do not lift more than 5 pounds. No excessive bending or twisting.   May shower / Bathe   Complete by:  As directed    Remove the dressing for 3 days after surgery.  You may shower, but leave the incision alone.   Remove dressing in 48 hours   Complete by:  As directed    Your stitches are under the scan and will dissolve by themselves. The Steri-Strips will fall off after you take a few showers. Do not rub back or pick at the wound, Leave the wound alone.     Allergies as of 05/26/2018      Reactions   Acetaminophen Other (See Comments)   PATIENT HAS AUTOIMMUNE HEPATITIS PATIENT IS * NOT * TO RECEIVE ANY ACETAMINOPHEN   Penicillin G Rash, Other (See Comments)   Has patient had a PCN reaction causing immediate rash, facial/tongue/throat swelling, SOB or lightheadedness with hypotension: No Has patient had a PCN reaction causing severe rash involving mucus membranes or skin necrosis: No Has patient had a PCN reaction that required hospitalization: No Has patient had a PCN reaction occurring within the last 10 years: #  #  #  YES  #  #  #      Medication List    TAKE these medications   albuterol (2.5 MG/3ML) 0.083% nebulizer solution Commonly known as:  PROVENTIL Take 2.5 mg by nebulization every 6 (six) hours as needed for wheezing or shortness of breath.   amLODipine 5 MG tablet Commonly known as:  NORVASC Take 5 mg by mouth daily.   apixaban 5 MG Tabs tablet Commonly known as:  ELIQUIS Take 5 mg by mouth 2 (two) times daily.   aspirin EC 81 MG tablet Take 81 mg by mouth daily.   azaTHIOprine 50 MG tablet Commonly known as:  IMURAN Take 100 mg by mouth daily.   beclomethasone 80 MCG/ACT inhaler Commonly known as:  QVAR Inhale 2 puffs into the lungs 2 (two) times daily.    buPROPion 150 MG 24 hr tablet Commonly known as:  WELLBUTRIN XL Take 150 mg by mouth daily.   COMBIVENT RESPIMAT 20-100 MCG/ACT Aers respimat Generic drug:  Ipratropium-Albuterol Inhale 1 puff into the lungs every 6 (six) hours as needed for wheezing or shortness of breath.   diclofenac sodium 1 % Gel Commonly known as:  VOLTAREN Apply 1 application topically 4 (four) times daily as needed (for pain).   fesoterodine 8 MG Tb24 tablet Commonly known as:  TOVIAZ Take 8 mg by mouth daily.   gabapentin 300 MG capsule Commonly known as:  NEURONTIN Take 300 mg by mouth 3 (three) times daily.   lisinopril 20 MG tablet Commonly known as:  PRINIVIL,ZESTRIL Take 20 mg by mouth daily.   Meclizine HCl 25 MG Chew Chew 25 mg by mouth 3 (three) times daily as needed for dizziness.   meloxicam 7.5 MG tablet Commonly known as:  MOBIC Take 7.5 mg by mouth daily.   metFORMIN 500 MG tablet Commonly known as:  GLUCOPHAGE Take 500 mg by mouth 2 (two) times daily with a meal.   metoprolol tartrate 50 MG tablet Commonly known as:  LOPRESSOR Take 50 mg by mouth 2 (two) times daily.   MULTIVITAMIN GUMMIES WOMENS PO Take 2 each by mouth daily.   Oxycodone HCl 10 MG Tabs Take 1 tablet (10 mg total) by mouth every 4 (four) hours as needed for severe pain ((score 7 to 10)). What changed:    when to take this  reasons to take this   pantoprazole 40 MG tablet Commonly known as:  PROTONIX Take 40 mg by mouth daily.   tiotropium 18 MCG inhalation capsule Commonly known as:  SPIRIVA Place 18 mcg into inhaler and inhale daily.   tiZANidine 2 MG tablet Commonly known as:  ZANAFLEX Take 1 tablet (2 mg total) by mouth every 8 (eight) hours as needed for muscle spasms. What changed:  See the new instructions.   TRILIPIX 135 MG capsule Generic drug:  Choline Fenofibrate Take 135 mg by mouth daily.      Follow-up Information    Tressie Stalker, MD Follow up.   Specialty:   Neurosurgery Contact information: [address] [place] [address] [phone]           Signed: Alyson Ingles 05/26/2018, 2:02 PM

## 2018-05-23 NOTE — Evaluation (Signed)
Physical Therapy Evaluation Patient Details Name: Nancy Simmons MRN: [id number] DOB: 10-20-1955 Today's Date: 05/23/2018   History of Present Illness  Pt is a 62 y/o female s/p L2-3 microdiscectomy. PMH including but not limited to CAD, HTN and DM.  Clinical Impression  Pt presented sitting OOB in recliner chair, awake and willing to participate in therapy session. Prior to admission, pt reported that she ambulated with use of rollator and required some assistance from an aide for ADLs. Pt currently able to perform transfers with supervision and ambulate in hallway with RW and min guard to supervision level. PT also successfully participated in stair training with min guard, no difficulties. PT reviewed 3/3 back precautions with pt throughout. PT also discussed a generalized walking program with pt to initiate upon d/c. PT will continue to follow acutely to progress mobility as tolerated.     Follow Up Recommendations No PT follow up;Supervision - Intermittent    Equipment Recommendations  None recommended by PT    Recommendations for Other Services       Precautions / Restrictions Precautions Precautions: Back;Fall Precaution Comments: PT reviewed 3/3 back precautions and verbally reviewed log roll  Restrictions Weight Bearing Restrictions: No      Mobility  Bed Mobility               General bed mobility comments: pt OOB in recliner chair upon arrival  Transfers Overall transfer level: Needs assistance Equipment used: Rolling walker (2 wheeled);None Transfers: Sit to/from Stand Sit to Stand: Supervision         General transfer comment: supervision for safety; pt initially standing without an AD  Ambulation/Gait Ambulation/Gait assistance: Min guard;Supervision Gait Distance (Feet): 200 Feet Assistive device: Rolling walker (2 wheeled) Gait Pattern/deviations: Step-through pattern;Decreased stride length Gait velocity: decreased   General Gait Details: pt  steady with RW, no LOB or need for physical assistance; mostly supervision level, min guard initially  Stairs Stairs: Yes Stairs assistance: Min guard Stair Management: Two rails;Step to pattern;Forwards Number of Stairs: 2 General stair comments: min guard for safety(x2 trials)  Wheelchair Mobility    Modified Rankin (Stroke Patients Only)       Balance Overall balance assessment: Needs assistance Sitting-balance support: Feet supported Sitting balance-Leahy Scale: Good     Standing balance support: During functional activity;No upper extremity supported Standing balance-Leahy Scale: Fair                               Pertinent Vitals/Pain Pain Assessment: 0-10 Pain Score: 7  Pain Location: back Pain Descriptors / Indicators: Sore Pain Intervention(s): Monitored during session;Repositioned    Home Living Family/patient expects to be discharged to:: Private residence Living Arrangements: Alone Available Help at Discharge: Family;Available PRN/intermittently   Home Access: Stairs to enter Entrance Stairs-Rails: Can reach both Entrance Stairs-Number of Steps: 5 Home Layout: One level Home Equipment: Emergency planning/management officer - 2 wheels;Cane - single point;Hospital bed;Walker - 4 wheels Additional Comments: pt has a HH aide every day for 2 hrs a day    Prior Function Level of Independence: Needs assistance   Gait / Transfers Assistance Needed: ambulates with rollator  ADL's / Homemaking Assistance Needed: has a HH aide for 2 hours everyday that assists with bathing and ADLs as needed        Hand Dominance        Extremity/Trunk Assessment   Upper Extremity Assessment Upper Extremity Assessment: Defer to OT evaluation;Overall [facility]  for tasks assessed    Lower Extremity Assessment Lower Extremity Assessment: Overall WFL for tasks assessed    Cervical / Trunk Assessment Cervical / Trunk Assessment: Other exceptions Cervical / Trunk Exceptions: s/p  lumbar sx  Communication   Communication: No difficulties  Cognition Arousal/Alertness: Awake/alert Behavior During Therapy: WFL for tasks assessed/performed Overall Cognitive Status: Within Functional Limits for tasks assessed                                        General Comments      Exercises     Assessment/Plan    PT Assessment Patient needs continued PT services  PT Problem List Decreased balance;Decreased coordination;Decreased mobility;Decreased knowledge of precautions;Pain       PT Treatment Interventions DME instruction;Gait training;Stair training;Functional mobility training;Therapeutic exercise;Therapeutic activities;Balance training;Neuromuscular re-education;Patient/family education    PT Goals (Current goals can be found in the Care Plan section)  Acute Rehab PT Goals Patient Stated Goal: decrease pain, walk without pain PT Goal Formulation: With patient Time For Goal Achievement: 06/06/18 Potential to Achieve Goals: Good    Frequency Min 5X/week   Barriers to discharge        Co-evaluation               AM-PAC PT "6 Clicks" Daily Activity  Outcome Measure Difficulty turning over in bed (including adjusting bedclothes, sheets and blankets)?: A Lot Difficulty moving from lying on back to sitting on the side of the bed? : A Lot Difficulty sitting down on and standing up from a chair with arms (e.g., wheelchair, bedside commode, etc,.)?: Unable Help needed moving to and from a bed to chair (including a wheelchair)?: A Little Help needed walking in hospital room?: A Little Help needed climbing 3-5 steps with a railing? : A Little 6 Click Score: 14    End of Session Equipment Utilized During Treatment: Gait belt Activity Tolerance: Patient tolerated treatment well Patient left: in chair;with call bell/phone within reach Nurse Communication: Mobility status PT Visit Diagnosis: Other abnormalities of gait and mobility  (R26.89);Pain Pain - part of body: (back)    Time: 1610-9604 PT Time Calculation (min) (ACUTE ONLY): 23 min   Charges:   PT Evaluation $PT Eval Moderate Complexity: 1 Mod PT Treatments $Gait Training: 8-22 mins        Deborah Chalk, PT, DPT  Acute Rehabilitation Services Pager [phone] Office [phone]    Alessandra Bevels Donivan Thammavong 05/23/2018, 10:15 AM

## 2018-05-24 DIAGNOSIS — M5116 Intervertebral disc disorders with radiculopathy, lumbar region: Secondary | ICD-10-CM | POA: Diagnosis not present

## 2018-05-24 LAB — GLUCOSE, CAPILLARY
GLUCOSE-CAPILLARY: 91 mg/dL (ref 70–99)
GLUCOSE-CAPILLARY: 114 mg/dL — AB (ref 70–99)
Glucose-Capillary: 99 mg/dL (ref 70–99)
Glucose-Capillary: 109 mg/dL — ABNORMAL HIGH (ref 70–99)

## 2018-05-24 NOTE — Progress Notes (Addendum)
Patient did not have ride yesterday to be discharged.  She will have a ride today for discharge. Order re-entered.  Addendum Patient got up to use restroom and had acute left lower extremity pain that was just as severe as her preoperative pain. She is now sitting comfortably without pain. Strength 5/5. Will ambulate with nursing. If pain returns and remains just as severe, will hold on discharge. Communicated with nursing.

## 2018-05-24 NOTE — Progress Notes (Signed)
Physical Therapy Treatment Patient Details Name: Nancy Simmons MRN: [id number] DOB: 1955-08-23 Today's Date: 05/24/2018    History of Present Illness Pt is a 62 y/o female s/p L2-3 microdiscectomy. PMH including but not limited to CAD, HTN and DM.    PT Comments    Pt unable to tolerate much activity this AM secondary to increased pain in back and L LE. Pt's RN aware. PT will continue to follow pt acutely to progress mobility as tolerated.    Follow Up Recommendations  Supervision - Intermittent;No PT follow up     Equipment Recommendations  None recommended by PT    Recommendations for Other Services       Precautions / Restrictions Precautions Precautions: Back;Fall Restrictions Weight Bearing Restrictions: No    Mobility  Bed Mobility               General bed mobility comments: pt OOB in recliner chair upon arrival  Transfers Overall transfer level: Needs assistance Equipment used: Rolling walker (2 wheeled) Transfers: Sit to/from Stand Sit to Stand: Supervision         General transfer comment: supervision for safety, good technique  Ambulation/Gait Ambulation/Gait assistance: Min guard Gait Distance (Feet): 10 Feet Assistive device: Rolling walker (2 wheeled) Gait Pattern/deviations: Step-to pattern;Decreased step length - right;Decreased stance time - left;Decreased stride length;Decreased weight shift to left;Antalgic Gait velocity: decreased   General Gait Details: pt with greatly increased pain in back and L LE with standing and ambulation; unable to tolerate ambulating further than 10'   Stairs             Wheelchair Mobility    Modified Rankin (Stroke Patients Only)       Balance Overall balance assessment: Needs assistance Sitting-balance support: Feet supported Sitting balance-Leahy Scale: Good     Standing balance support: Bilateral upper extremity supported Standing balance-Leahy Scale: Poor                               Cognition Arousal/Alertness: Awake/alert Behavior During Therapy: WFL for tasks assessed/performed Overall Cognitive Status: Within Functional Limits for tasks assessed                                        Exercises      General Comments        Pertinent Vitals/Pain Pain Assessment: 0-10 Pain Score: 8  Pain Location: back, L LE with standing and ambulation Pain Descriptors / Indicators: Sore;Sharp;Shooting Pain Intervention(s): Monitored during session;Repositioned;RN gave pain meds during session    Home Living                      Prior Function            PT Goals (current goals can now be found in the care plan section) Acute Rehab PT Goals PT Goal Formulation: With patient Time For Goal Achievement: 06/06/18 Potential to Achieve Goals: Good Progress towards PT goals: Progressing toward goals    Frequency    Min 5X/week      PT Plan Current plan remains appropriate    Co-evaluation              AM-PAC PT "6 Clicks" Daily Activity  Outcome Measure  Difficulty turning over in bed (including adjusting bedclothes, sheets and blankets)?: A Lot Difficulty moving from  lying on back to sitting on the side of the bed? : A Lot Difficulty sitting down on and standing up from a chair with arms (e.g., wheelchair, bedside commode, etc,.)?: Unable Help needed moving to and from a bed to chair (including a wheelchair)?: A Little Help needed walking in hospital room?: A Little Help needed climbing 3-5 steps with a railing? : A Little 6 Click Score: 14    End of Session Equipment Utilized During Treatment: Gait belt Activity Tolerance: Patient limited by pain Patient left: in chair;with call bell/phone within reach;Other (comment)(RT in room providing breathing treatment) Nurse Communication: Mobility status PT Visit Diagnosis: Other abnormalities of gait and mobility (R26.89);Pain Pain - Right/Left: Left Pain - part of  body: Leg(back)     Time: 1610-9604 PT Time Calculation (min) (ACUTE ONLY): 14 min  Charges:  $Therapeutic Activity: 8-22 mins                     Deborah Chalk, PT, DPT  Acute Rehabilitation Services Pager [phone] Office [phone]     Nancy Simmons 05/24/2018, 9:29 AM

## 2018-05-25 DIAGNOSIS — M5116 Intervertebral disc disorders with radiculopathy, lumbar region: Secondary | ICD-10-CM | POA: Diagnosis not present

## 2018-05-25 LAB — GLUCOSE, CAPILLARY
GLUCOSE-CAPILLARY: 103 mg/dL — AB (ref 70–99)
GLUCOSE-CAPILLARY: 160 mg/dL — AB (ref 70–99)
Glucose-Capillary: 98 mg/dL (ref 70–99)

## 2018-05-25 MED ORDER — INFLUENZA VAC SPLIT QUAD 0.5 ML IM SUSY
0.5000 mL | PREFILLED_SYRINGE | INTRAMUSCULAR | Status: AC | PRN
  Administered 2018-05-26: 0.5 mL via INTRAMUSCULAR

## 2018-05-25 MED ORDER — DEXAMETHASONE SODIUM PHOSPHATE 10 MG/ML IJ SOLN
10.0000 mg | Freq: Once | INTRAMUSCULAR | Status: AC
  Administered 2018-05-25: 10 mg via INTRAVENOUS
  Filled 2018-05-25: qty 1

## 2018-05-25 NOTE — Progress Notes (Addendum)
  NEUROSURGERY PROGRESS NOTE   No issues overnight. Reports continued LLE pain with ambulation. Not as severe as prior to surgery Tolerating po. Voiding normal.   EXAM:  BP 121/66 (BP Location: Left Arm)   Pulse 90   Temp 98.3 F (36.8 C) (Oral)   Resp 18   Ht 5\' 6"  (1.676 m)   Wt 129.1 kg   SpO2 98%   BMI 45.94 kg/m   Awake, alert, oriented  Speech fluent, appropriate  CN grossly intact  MAEW with good strength Incision with focal area of dried blood, but no active bleeding.  PLAN Continued LLE pain with ambulation, but remains neuro intact and without pain while stationary. Does not feel comfortable going home yet. Will trial steroid for possible post op radiculitis. Dr Lovell Sheehan to follow up tomorrow.

## 2018-05-25 NOTE — Progress Notes (Signed)
Physical Therapy Treatment Patient Details Name: Nancy Simmons MRN: [id number] DOB: [birth year] Today's Date: 05/25/2018    History of Present Illness Pt is a 62 y.o. female s/p L2-3 microdiscectomy on 05/22/18. Pt with worsening LLE pain post-op; neursosurgery to trial steroid for possible post-op radiculitis. PMH includes CAD, HTN, DM.   PT Comments    Pt remains limited by significant LLE pain with mobility, especially while weightbearing. LLE strength WFL, but pt with difficulty progressing L foot forward when walking secondary to L hip and anterior thigh pain. Able to use RW to amb 2' requiring seated rest break secondary to pain, then an additional 2' before needing to rest. Do not feel pt can safely discharge home at this point. Reports family cannot provide consistent assist if needed. Will continue to follow acutely.   Follow Up Recommendations  Home health PT;Supervision for mobility/OOB     Equipment Recommendations  (may require w/c if to discharge home while still limited by pain)    Recommendations for Other Services       Precautions / Restrictions Precautions Precautions: Back;Fall Restrictions Weight Bearing Restrictions: No    Mobility  Bed Mobility Overal bed mobility: Modified Independent             General bed mobility comments: Good ability to perform log roll, reports pain not as bad with bed mobility compared to when WB  Transfers Overall transfer level: Needs assistance Equipment used: Rolling walker (2 wheeled) Transfers: Sit to/from Stand Sit to Stand: Supervision         General transfer comment: Bed height slightly raised; supervision for safety. C/o significant LLE pain upon WB  Ambulation/Gait Ambulation/Gait assistance: Min guard Gait Distance (Feet): 5 Feet Assistive device: Rolling walker (2 wheeled) Gait Pattern/deviations: Step-to pattern;Antalgic;Decreased dorsiflexion - left;Decreased step length - left;Trunk flexed Gait  velocity: decreased Gait velocity interpretation: <1.31 ft/sec, indicative of household ambulator General Gait Details: Min guard secondary to worsening LLE pain. Able to take a few steps forwards/backwards with RW before needing to sit. Performed second trial, which went better pt leading with R foot, but still only able to tolerate a few steps   Stairs             Wheelchair Mobility    Modified Rankin (Stroke Patients Only)       Balance Overall balance assessment: Needs assistance Sitting-balance support: Feet supported Sitting balance-Leahy Scale: Good     Standing balance support: Bilateral upper extremity supported Standing balance-Leahy Scale: Poor                              Cognition Arousal/Alertness: Awake/alert Behavior During Therapy: WFL for tasks assessed/performed Overall Cognitive Status: Within Functional Limits for tasks assessed                                        Exercises      General Comments        Pertinent Vitals/Pain Pain Assessment: Faces Faces Pain Scale: Hurts whole lot Pain Location: L hip into LLE while moving, especially WB; reports no pain at rest Pain Descriptors / Indicators: Sore;Sharp;Shooting;Grimacing;Numbness Pain Intervention(s): Monitored during session;Limited activity within patient's tolerance    Home Living                      Prior Function  PT Goals (current goals can now be found in the care plan section) Acute Rehab PT Goals Patient Stated Goal: decrease pain, walk without pain PT Goal Formulation: With patient Time For Goal Achievement: 06/06/18 Potential to Achieve Goals: Fair Progress towards PT goals: Progressing toward goals    Frequency    Min 5X/week      PT Plan Discharge plan needs to be updated    Co-evaluation              AM-PAC PT "6 Clicks" Daily Activity  Outcome Measure  Difficulty turning over in bed (including  adjusting bedclothes, sheets and blankets)?: None Difficulty moving from lying on back to sitting on the side of the bed? : None Difficulty sitting down on and standing up from a chair with arms (e.g., wheelchair, bedside commode, etc,.)?: Unable Help needed moving to and from a bed to chair (including a wheelchair)?: A Little Help needed walking in hospital room?: A Little Help needed climbing 3-5 steps with a railing? : A Lot 6 Click Score: 17    End of Session Equipment Utilized During Treatment: Gait belt Activity Tolerance: Patient limited by pain Patient left: in bed;with call bell/phone within reach;with nursing/sitter in room Nurse Communication: Mobility status PT Visit Diagnosis: Other abnormalities of gait and mobility (R26.89);Pain Pain - Right/Left: Left Pain - part of body: Leg     Time: 1610-9604 PT Time Calculation (min) (ACUTE ONLY): 17 min  Charges:  $Gait Training: 8-22 mins                     Ina Homes, PT, DPT Acute Rehabilitation Services  Pager [phone] Office [phone]  Malachy Chamber 05/25/2018, 11:03 AM

## 2018-05-26 DIAGNOSIS — M5116 Intervertebral disc disorders with radiculopathy, lumbar region: Secondary | ICD-10-CM | POA: Diagnosis not present

## 2018-05-26 LAB — GLUCOSE, CAPILLARY
GLUCOSE-CAPILLARY: 137 mg/dL — AB (ref 70–99)
GLUCOSE-CAPILLARY: 106 mg/dL — AB (ref 70–99)
Glucose-Capillary: 83 mg/dL (ref 70–99)
Glucose-Capillary: 106 mg/dL — ABNORMAL HIGH (ref 70–99)

## 2018-05-26 MED ORDER — PNEUMOCOCCAL VAC POLYVALENT 25 MCG/0.5ML IJ INJ
0.5000 mL | INJECTION | INTRAMUSCULAR | Status: AC
  Administered 2018-05-26: 0.5 mL via INTRAMUSCULAR
  Filled 2018-05-26: qty 0.5

## 2018-05-26 MED ORDER — OXYCODONE HCL 10 MG PO TABS: 10.0000 mg | ORAL_TABLET | ORAL | 0 refills | Status: DC | PRN

## 2018-05-26 MED ORDER — TIZANIDINE HCL 2 MG PO TABS: 2.0000 mg | ORAL_TABLET | Freq: Three times a day (TID) | ORAL | 0 refills | Status: DC | PRN

## 2018-05-26 MED ORDER — INFLUENZA VAC SPLIT QUAD 0.5 ML IM SUSY
0.5000 mL | PREFILLED_SYRINGE | INTRAMUSCULAR | Status: AC
  Filled 2018-05-26: qty 0.5

## 2018-05-26 NOTE — Progress Notes (Signed)
Physical Therapy Treatment Patient Details Name: Nancy Simmons MRN: [id number] DOB: Nov 13, 1955 Today's Date: 05/26/2018    History of Present Illness Pt is a 62 y.o. female s/p L2-3 microdiscectomy on 05/22/18. Pt with worsening LLE pain post-op; neursosurgery to trial steroid for possible post-op radiculitis. PMH includes CAD, HTN, DM.    PT Comments    Patient progressing slowly towards PT goals. Continues to have significant LLE pain with gait and especially during swing phase of gait. Reports mild improvement with taking short steps. Pain immediately improves with sitting. Some improvement from yesterday's session. Able to negotiate 2 steps with increased time and difficulty, however pt has 5 steps to enter home. Concerned about pt going home in this state; slowly improving with regards to activity but not walking distances that correlate with getting around in home environment. Started steroids yesterday. Would recommend an additional night to try to increase activity. Lengthy discussion problem solving home setup and how to safely manuever throughout house, negotiate steps etc Encouraged walking with nursing today. Pt is home alone and only has an aide for 1.5 hrs in evening. Will continue to follow.    Follow Up Recommendations  Home health PT;Supervision for mobility/OOB     Equipment Recommendations  None recommended by PT    Recommendations for Other Services       Precautions / Restrictions Precautions Precautions: Back;Fall Precaution Comments: PT reviewed 3/3 back precautions and verbally reviewed log roll  Restrictions Weight Bearing Restrictions: No    Mobility  Bed Mobility               General bed mobility comments: Sitting in chair upon PT arrival.   Transfers Overall transfer level: Needs assistance Equipment used: Rolling walker (2 wheeled) Transfers: Sit to/from Stand Sit to Stand: Supervision         General transfer comment: SUpervision for  safety. Stood from chair x3. Pain through LLE with WB.  Ambulation/Gait Ambulation/Gait assistance: Min guard Gait Distance (Feet): 10 Feet(+ 12') Assistive device: Rolling walker (2 wheeled) Gait Pattern/deviations: Step-to pattern;Antalgic;Decreased step length - left;Trunk flexed;Step-through pattern Gait velocity: decreased   General Gait Details: Slow, unsteady gait with difficulty advancing LLE due to pain; increased WB through BUEs. Cues to decrease step lengths temporarily to decrease pain.   Stairs Stairs: Yes Stairs assistance: Min guard Stair Management: Two rails;Step to pattern;Forwards Number of Stairs: 2 General stair comments: Min guard for safety, lots of difficulty bringing LLE up ons tair   Wheelchair Mobility    Modified Rankin (Stroke Patients Only)       Balance Overall balance assessment: Needs assistance Sitting-balance support: Feet supported;No upper extremity supported Sitting balance-Leahy Scale: Good     Standing balance support: During functional activity;Bilateral upper extremity supported Standing balance-Leahy Scale: Poor                              Cognition Arousal/Alertness: Awake/alert Behavior During Therapy: WFL for tasks assessed/performed Overall Cognitive Status: Within Functional Limits for tasks assessed                                        Exercises      General Comments General comments (skin integrity, edema, etc.): Lengthy discussion problem solving home setup and how to safely manuever throughout house, negotiate steps etc      Pertinent  Vitals/Pain Pain Assessment: 0-10 Pain Score: 10-Worst pain ever Pain Location: Lft hip into LLE while moving, especially WB; reports no pain at rest Pain Descriptors / Indicators: Sore;Sharp;Shooting;Grimacing;Numbness Pain Intervention(s): Monitored during session;Repositioned;Limited activity within patient's tolerance    Home Living                       Prior Function            PT Goals (current goals can now be found in the care plan section) Progress towards PT goals: Progressing toward goals    Frequency    Min 5X/week      PT Plan Current plan remains appropriate    Co-evaluation              AM-PAC PT "6 Clicks" Daily Activity  Outcome Measure  Difficulty turning over in bed (including adjusting bedclothes, sheets and blankets)?: None Difficulty moving from lying on back to sitting on the side of the bed? : None Difficulty sitting down on and standing up from a chair with arms (e.g., wheelchair, bedside commode, etc,.)?: A Little Help needed moving to and from a bed to chair (including a wheelchair)?: A Little Help needed walking in hospital room?: A Little Help needed climbing 3-5 steps with a railing? : A Little 6 Click Score: 20    End of Session Equipment Utilized During Treatment: Gait belt Activity Tolerance: Patient limited by pain Patient left: in chair;with call bell/phone within reach Nurse Communication: Mobility status PT Visit Diagnosis: Other abnormalities of gait and mobility (R26.89);Pain Pain - Right/Left: Left Pain - part of body: Leg     Time: 1610-9604 PT Time Calculation (min) (ACUTE ONLY): 28 min  Charges:  $Gait Training: 8-22 mins $Self Care/Home Management: 8-22                     Mylo Red, PT, DPT Acute Rehabilitation Services Pager [phone] Office [phone]       Blake Divine A Tomasa Dobransky 05/26/2018, 9:06 AM

## 2018-05-26 NOTE — [place] FL2 (Signed)
[place] MEDICAID FL2 LEVEL OF CARE SCREENING TOOL     IDENTIFICATION  Patient Name: Nancy Simmons Birthdate: 06/21/1956 Sex: female Admission Date (Current Location): 05/22/2018  [facility] and [place] Number:  [employer] and Address:  The . [facility], [address] [place], [place] [address]      Provider Number: [id number]  Attending Physician Name and Address:  Tressie Stalker, MD  Relative Name and Phone Number:       Current Level of Care: Hospital Recommended Level of Care: Skilled Nursing Facility Prior Approval Number:    Date Approved/Denied:   PASRR Number: [id number]  Discharge Plan: SNF    Current Diagnoses: Patient Active Problem List   Diagnosis Date Noted  . Lumbar herniated disc 05/22/2018  . Status post total replacement of left hip 12/10/2017  . Unilateral primary osteoarthritis, left hip 11/04/2017  . Abnormal stress echocardiography   . Stable angina (HCC) 04/03/2017  . DM2 (diabetes mellitus, type 2) (HCC) 04/03/2017  . Asthma 04/03/2017  . OSA on CPAP 04/03/2017  . Angina at rest [facility]) 04/03/2017  . Medication monitoring encounter 07/09/2016  . Autoimmune hepatitis (HCC) 07/09/2016  . 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 HTN (hypertension) 09/27/2015  . CAD (coronary artery disease) of artery bypass graft 09/27/2015  . Hypercholesterolemia 09/27/2015  . S/P cholecystectomy 09/27/2015    Orientation RESPIRATION BLADDER Height & Weight     Self, Time, Situation, Place  Normal Continent Weight: 284 lb 9.6 oz (129.1 kg) Height:  5\' 6"  (167.6 cm)  BEHAVIORAL SYMPTOMS/MOOD NEUROLOGICAL BOWEL NUTRITION STATUS      Continent Diet(carb modified)  AMBULATORY STATUS COMMUNICATION OF NEEDS Skin   Limited Assist Verbally Surgical wounds(left back, closed incision, honeycomb dressing)                       Personal Care Assistance Level of Assistance  Bathing, Feeding, Dressing  Bathing Assistance: Limited assistance Feeding assistance: Independent Dressing Assistance: Limited assistance     Functional Limitations Info  Sight, Hearing, Speech Sight Info: Adequate Hearing Info: Adequate Speech Info: Adequate    SPECIAL CARE FACTORS FREQUENCY  PT (By licensed PT), OT (By licensed OT)     PT Frequency: 5x/wk OT Frequency: 5x/wk            Contractures Contractures Info: Not present    Additional Factors Info  Code Status, Allergies Code Status Info: Full Allergies Info: Acetaminophen, Penicillin G           Current Medications (05/26/2018):  This is the current hospital active medication list Current Facility-Administered Medications  Medication Dose Route Frequency Provider Last Rate Last Dose  . 0.9 %  sodium chloride infusion  250 mL Intravenous Continuous Tressie Stalker, MD   Stopped at 05/22/18 1747  . amLODipine (NORVASC) tablet 5 mg  5 mg Oral Daily Tressie Stalker, MD   5 mg at 05/26/18 1043  . azaTHIOprine (IMURAN) tablet 100 mg  100 mg Oral Daily Tressie Stalker, MD   100 mg at 05/26/18 1044  . bisacodyl (DULCOLAX) suppository 10 mg  10 mg Rectal Daily PRN Tressie Stalker, MD      . budesonide (PULMICORT) nebulizer solution 0.25 mg  0.25 mg Nebulization BID Tressie Stalker, MD   0.25 mg at 05/26/18 0801  . buPROPion (WELLBUTRIN XL) 24 hr tablet 150 mg  150 mg Oral Daily Tressie Stalker, MD   150 mg at 05/26/18 1044  .  docusate sodium (COLACE) capsule 100 mg  100 mg Oral BID Tressie Stalker, MD   100 mg at 05/26/18 1043  . fenofibrate tablet 160 mg  160 mg Oral Daily Tressie Stalker, MD   160 mg at 05/26/18 1043  . fesoterodine (TOVIAZ) tablet 8 mg  8 mg Oral Daily Tressie Stalker, MD   8 mg at 05/26/18 1050  . gabapentin (NEURONTIN) capsule 300 mg  300 mg Oral TID Tressie Stalker, MD   300 mg at 05/26/18 1044  . [COMPLETED] Influenza vac split quadrivalent PF (FLUARIX) injection 0.5 mL  0.5 mL Intramuscular Tomorrow-1000  Tressie Stalker, MD      . insulin aspart (novoLOG) injection 0-20 Units  0-20 Units Subcutaneous TID AC & HS Tressie Stalker, MD   3 Units at 05/26/18 0754  . ipratropium-albuterol (DUONEB) 0.5-2.5 (3) MG/3ML nebulizer solution 3 mL  3 mL Inhalation Q6H PRN Tressie Stalker, MD      . lactated ringers infusion   Intravenous Continuous Armond Hang, Chelsey L, MD 10 mL/hr at 05/22/18 1053    . lisinopril (PRINIVIL,ZESTRIL) tablet 20 mg  20 mg Oral Daily Tressie Stalker, MD   20 mg at 05/26/18 1044  . meclizine (ANTIVERT) tablet 25 mg  25 mg Oral TID PRN Tressie Stalker, MD      . meloxicam [facility]) tablet 7.5 mg  7.5 mg Oral Daily Tressie Stalker, MD   7.5 mg at 05/26/18 1044  . menthol-cetylpyridinium (CEPACOL) lozenge 3 mg  1 lozenge Oral PRN Tressie Stalker, MD       Or  . phenol [facility]) mouth spray 1 spray  1 spray Mouth/Throat PRN Tressie Stalker, MD      . metFORMIN (GLUCOPHAGE) tablet 500 mg  500 mg Oral BID WC Tressie Stalker, MD   500 mg at 05/26/18 1043  . metoprolol tartrate (LOPRESSOR) tablet 50 mg  50 mg Oral BID Tressie Stalker, MD   50 mg at 05/26/18 1044  . morphine 4 MG/ML injection 4 mg  4 mg Intravenous Q2H PRN Tressie Stalker, MD   4 mg at 05/22/18 2046  . multivitamin with minerals tablet 1 tablet  1 tablet Oral Daily Tressie Stalker, MD   1 tablet at 05/26/18 1043  . ondansetron (ZOFRAN) tablet 4 mg  4 mg Oral Q6H PRN Tressie Stalker, MD       Or  . ondansetron [facility]) injection 4 mg  4 mg Intravenous Q6H PRN Tressie Stalker, MD      . oxyCODONE (Oxy IR/ROXICODONE) immediate release tablet 10 mg  10 mg Oral Q3H PRN Tressie Stalker, MD   10 mg at 05/26/18 1044  . oxyCODONE (Oxy IR/ROXICODONE) immediate release tablet 5 mg  5 mg Oral Q3H PRN Tressie Stalker, MD      . pantoprazole (PROTONIX) EC tablet 40 mg  40 mg Oral Daily Tressie Stalker, MD   40 mg at 05/26/18 1044  . [START ON 05/27/2018] pneumococcal 23 valent vaccine (PNU-IMMUNE) injection 0.5 mL  0.5  mL Intramuscular Tomorrow-1000 Tressie Stalker, MD      . sodium chloride flush (NS) 0.9 % injection 3 mL  3 mL Intravenous Q12H Tressie Stalker, MD   3 mL at 05/25/18 2257  . sodium chloride flush (NS) 0.9 % injection 3 mL  3 mL Intravenous PRN Tressie Stalker, MD      . tiZANidine (ZANAFLEX) tablet 2 mg  2 mg Oral Q8H PRN Tressie Stalker, MD   2 mg at 05/24/18 1047  . umeclidinium bromide (INCRUSE ELLIPTA) 62.5  MCG/INH 1 puff  1 puff Inhalation Daily Tressie Stalker, MD   1 puff at 05/26/18 0801  . zolpidem (AMBIEN) tablet 5 mg  5 mg Oral QHS PRN Tressie Stalker, MD         Discharge Medications: Please see discharge summary for a list of discharge medications.  Relevant Imaging Results:  Relevant Lab Results:   Additional Information SS#: [id number]  Baldemar Lenis, LCSW

## 2018-05-26 NOTE — Clinical Social Work Note (Signed)
Clinical Social Work Assessment  Patient Details  Name: Nancy Simmons MRN: [id number] Date of Birth: 11/23/1955  Date of referral:  05/26/18               Reason for consult:  Facility Placement                Permission sought to share information with:  Chartered certified accountant granted to share information::  Yes, Verbal Permission Granted  Name::        Agency::  [facility]  Relationship::     Contact Information:     Housing/Transportation Living arrangements for the past 2 months:  Single Family Home Source of Information:  Patient Patient Interpreter Needed:  None Criminal Activity/Legal Involvement Pertinent to Current Situation/Hospitalization:  No - Comment as needed Significant Relationships:  Adult Children Lives with:  Self Do you feel safe going back to the place where you live?  Yes Need for family participation in patient care:  No (Coment)  Care giving concerns:  Patient from home alone, but anxious about returning home alone post-surgery. Patient prefers to be considered for SNF, if appropriate.   Social Worker assessment / plan:  CSW met with patient and discussed patient's preference for SNF placement instead of home. Patient discussed how she's scared to go home alone. CSW discussed patient needing to stay for 30 days with her Medicaid, and patient agreeable. Patient requested [facility] in [place]. CSW confirmed bed offer. CSW went to update patient, and patient requesting [facility] instead. CSW sent referral. Awaiting decision from [facility] on bed offer.  Employment status:  Retired Forensic scientist:  Medicaid In [place] PT Recommendations:  Home with [place] / Referral to community resources:  [place]  Patient/Family's Response to care:  Patient prefers SNF placement.  Patient/Family's Understanding of and Emotional Response to Diagnosis, Current Treatment, and Prognosis:  Patient aware of deficits  and anxious about her ability to manage on her own in her home. Patient would like to be considered for SNF at this time to feel better about assistance at discharge. Patient apologized for not thinking of [facility] on initial discussion, but would prefer to be closer to her daughters and grandsons.  Emotional Assessment Appearance:  Appears stated age Attitude/Demeanor/Rapport:  Engaged Affect (typically observed):  Pleasant Orientation:  Oriented to Self, Oriented to Place, Oriented to  Time, Oriented to Situation Alcohol / Substance use:  Not Applicable Psych involvement (Current and /or in the community):  No (Comment)  Discharge Needs  Concerns to be addressed:  Care Coordination Readmission within the last 30 days:  No Current discharge risk:  Physical Impairment, Dependent with Mobility, Lives alone Barriers to Discharge:  No Barriers Identified   Geralynn Ochs, LCSW 05/26/2018, 3:53 PM

## 2018-05-26 NOTE — Progress Notes (Signed)
Patient nervous about going home alone, notified social work that patient wanting to go SNF.

## 2018-05-26 NOTE — Discharge Summary (Signed)
Physician Discharge Summary  Patient ID: Nancy Simmons MRN: [id number] DOB/AGE: 1955-10-20 62 y.o.  Admit date: 05/22/2018 Discharge date: 05/26/2018  Admission Diagnoses: Lumbar herniated disc, lumbar radiculopathy  Discharge Diagnoses: The same Active Problems:   Lumbar herniated disc   Discharged Condition: fair  Hospital Course: I performed a left L2-3 discectomy on the patient on 05/22/2018.  The surgery went well.  The patient's postoperative course was remarkable only for pain control.  We had PT and OT see the patient to mobilize her.  She did not feel comfortable going home alone.  Arrangements were made for skilled nursing facility placement for convalescence.  Was transferred to the skilled nursing facility.  She was given written and oral discharge instructions.  All questions were answered.  Consults: PT, OT Significant Diagnostic Studies: None Treatments: Left L2-3 discectomy using microdissection Discharge Exam: Blood pressure 135/76, pulse 61, temperature 98.4 F (36.9 C), temperature source Oral, resp. rate 20, height 5\' 6"  (1.676 m), weight 129.1 kg, SpO2 96 %. The patient is alert and pleasant.  Her strength is grossly normal in her lower extremities.  Disposition: Skilled nursing facility  Discharge Instructions    Call MD for:  difficulty breathing, headache or visual disturbances   Complete by:  As directed    Call MD for:  difficulty breathing, headache or visual disturbances   Complete by:  As directed    Call MD for:  extreme fatigue   Complete by:  As directed    Call MD for:  extreme fatigue   Complete by:  As directed    Call MD for:  hives   Complete by:  As directed    Call MD for:  hives   Complete by:  As directed    Call MD for:  persistant dizziness or light-headedness   Complete by:  As directed    Call MD for:  persistant dizziness or light-headedness   Complete by:  As directed    Call MD for:  persistant nausea and vomiting   Complete  by:  As directed    Call MD for:  persistant nausea and vomiting   Complete by:  As directed    Call MD for:  redness, tenderness, or signs of infection (pain, swelling, redness, odor or green/yellow discharge around incision site)   Complete by:  As directed    Call MD for:  redness, tenderness, or signs of infection (pain, swelling, redness, odor or green/yellow discharge around incision site)   Complete by:  As directed    Call MD for:  severe uncontrolled pain   Complete by:  As directed    Call MD for:  severe uncontrolled pain   Complete by:  As directed    Call MD for:  temperature >100.4   Complete by:  As directed    Call MD for:  temperature >100.4   Complete by:  As directed    Diet - low sodium heart healthy   Complete by:  As directed    Diet - low sodium heart healthy   Complete by:  As directed    Discharge instructions   Complete by:  As directed    Call [phone] for a followup appointment. Take a stool softener while you are using pain medications.   Discharge instructions   Complete by:  As directed    Call [phone] for a followup appointment. Take a stool softener while you are using pain medications.   Driving Restrictions   Complete by:  As directed    Do not drive for 2 weeks.   Driving Restrictions   Complete by:  As directed    Do not drive for 2 weeks.   Increase activity slowly   Complete by:  As directed    Increase activity slowly   Complete by:  As directed    Lifting restrictions   Complete by:  As directed    Do not lift more than 5 pounds. No excessive bending or twisting.   Lifting restrictions   Complete by:  As directed    Do not lift more than 5 pounds. No excessive bending or twisting.   May shower / Bathe   Complete by:  As directed    Remove the dressing for 3 days after surgery.  You may shower, but leave the incision alone.   May shower / Bathe   Complete by:  As directed    Remove the dressing for 3 days after surgery.   You may shower, but leave the incision alone.   No dressing needed   Complete by:  As directed    Remove dressing in 48 hours   Complete by:  As directed    Your stitches are under the scan and will dissolve by themselves. The Steri-Strips will fall off after you take a few showers. Do not rub back or pick at the wound, Leave the wound alone.     Allergies as of 05/26/2018      Reactions   Acetaminophen Other (See Comments)   PATIENT HAS AUTOIMMUNE HEPATITIS PATIENT IS * NOT * TO RECEIVE ANY ACETAMINOPHEN   Penicillin G Rash, Other (See Comments)   Has patient had a PCN reaction causing immediate rash, facial/tongue/throat swelling, SOB or lightheadedness with hypotension: No Has patient had a PCN reaction causing severe rash involving mucus membranes or skin necrosis: No Has patient had a PCN reaction that required hospitalization: No Has patient had a PCN reaction occurring within the last 10 years: #  #  #  YES  #  #  #      Medication List    TAKE these medications   albuterol (2.5 MG/3ML) 0.083% nebulizer solution Commonly known as:  PROVENTIL Take 2.5 mg by nebulization every 6 (six) hours as needed for wheezing or shortness of breath.   amLODipine 5 MG tablet Commonly known as:  NORVASC Take 5 mg by mouth daily.   apixaban 5 MG Tabs tablet Commonly known as:  ELIQUIS Take 5 mg by mouth 2 (two) times daily.   aspirin EC 81 MG tablet Take 81 mg by mouth daily.   azaTHIOprine 50 MG tablet Commonly known as:  IMURAN Take 100 mg by mouth daily.   beclomethasone 80 MCG/ACT inhaler Commonly known as:  QVAR Inhale 2 puffs into the lungs 2 (two) times daily.   buPROPion 150 MG 24 hr tablet Commonly known as:  WELLBUTRIN XL Take 150 mg by mouth daily.   COMBIVENT RESPIMAT 20-100 MCG/ACT Aers respimat Generic drug:  Ipratropium-Albuterol Inhale 1 puff into the lungs every 6 (six) hours as needed for wheezing or shortness of breath.   diclofenac sodium 1 %  Gel Commonly known as:  VOLTAREN Apply 1 application topically 4 (four) times daily as needed (for pain).   fesoterodine 8 MG Tb24 tablet Commonly known as:  TOVIAZ Take 8 mg by mouth daily.   gabapentin 300 MG capsule Commonly known as:  NEURONTIN Take 300 mg by mouth 3 (three) times daily.  lisinopril 20 MG tablet Commonly known as:  PRINIVIL,ZESTRIL Take 20 mg by mouth daily.   Meclizine HCl 25 MG Chew Chew 25 mg by mouth 3 (three) times daily as needed for dizziness.   meloxicam 7.5 MG tablet Commonly known as:  MOBIC Take 7.5 mg by mouth daily.   metFORMIN 500 MG tablet Commonly known as:  GLUCOPHAGE Take 500 mg by mouth 2 (two) times daily with a meal.   metoprolol tartrate 50 MG tablet Commonly known as:  LOPRESSOR Take 50 mg by mouth 2 (two) times daily.   MULTIVITAMIN GUMMIES WOMENS PO Take 2 each by mouth daily.   Oxycodone HCl 10 MG Tabs Take 1 tablet (10 mg total) by mouth every 4 (four) hours as needed ((score 7 to 10)). What changed:    when to take this  reasons to take this   pantoprazole 40 MG tablet Commonly known as:  PROTONIX Take 40 mg by mouth daily.   tiotropium 18 MCG inhalation capsule Commonly known as:  SPIRIVA Place 18 mcg into inhaler and inhale daily.   tiZANidine 2 MG tablet Commonly known as:  ZANAFLEX Take 1 tablet (2 mg total) by mouth every 8 (eight) hours as needed for muscle spasms. What changed:  See the new instructions.   TRILIPIX 135 MG capsule Generic drug:  Choline Fenofibrate Take 135 mg by mouth daily.       Contact information for follow-up providers    Tressie Stalker, MD Follow up.   Specialty:  Neurosurgery Contact information: [address] [place] [address] [phone]            Contact information for after-discharge care    Destination    [facility] .   Service:  Skilled Nursing Contact information: [address] [place]  [place] [address] [phone]                  Signed: Cristi Loron 05/26/2018, 2:35 PM

## 2018-05-26 NOTE — Care Management Note (Signed)
Case Management Note  Patient Details  Name: Nancy Simmons MRN: [id number] Date of Birth: 11/26/1955  Subjective/Objective:                    Action/Plan: Per CSW patient has decided she cant manage post surgery at home. She is requesting rehab stay before returning home. CSW working on placement. CM signing off.   Expected Discharge Date:  05/26/18               Expected Discharge Plan:  Home/Self Care  In-House Referral:     Discharge planning Services     Post Acute Care Choice:    Choice offered to:     DME Arranged:    DME Agency:     HH Arranged:    HH Agency:     Status of Service:  Completed, signed off  If discussed at [employer] of Stay Meetings, dates discussed:    Additional Comments:  Kermit Balo, RN 05/26/2018, 4:18 PM

## 2018-05-26 NOTE — Clinical Social Work Placement (Signed)
Nurse to call report to [phone]     CLINICAL SOCIAL WORK PLACEMENT  NOTE  Date:  05/26/2018  Patient Details  Name: Nancy Simmons MRN: [id number] Date of Birth: 01-29-1956  Clinical Social Work is seeking post-discharge placement for this patient at the Skilled  Nursing Facility level of care (*CSW will initial, date and re-position this form in  chart as items are completed):  Yes   Patient/family provided with [facility] Clinical Social Work Department's list of facilities offering this level of care within the geographic area requested by the patient (or if unable, by the patient's family).  Yes   Patient/family informed of their freedom to choose among providers that offer the needed level of care, that participate in Medicare, Medicaid or managed care program needed by the patient, have an available bed and are willing to accept the patient.  Yes   Patient/family informed of [facility]'s ownership interest in [facility] and [facility], as well as of the fact that they are under no obligation to receive care at these facilities.  PASRR submitted to EDS on       PASRR number received on       Existing PASRR number confirmed on 05/26/18     FL2 transmitted to all facilities in geographic area requested by pt/family on 05/26/18     FL2 transmitted to all facilities within larger geographic area on       Patient informed that his/her managed care company has contracts with or will negotiate with certain facilities, including the following:        Yes   Patient/family informed of bed offers received.  Patient chooses bed at ([facility])     Physician recommends and patient chooses bed at      Patient to be transferred to ([facility] on 05/26/18.  Patient to be transferred to facility by [facility]     Patient family notified on 05/26/18 of transfer.  Name of family member notified:        PHYSICIAN       Additional Comment:     _______________________________________________ Baldemar Lenis, LCSW 05/26/2018, 4:14 PM

## 2018-05-27 DIAGNOSIS — M5116 Intervertebral disc disorders with radiculopathy, lumbar region: Secondary | ICD-10-CM | POA: Diagnosis not present

## 2018-06-04 ENCOUNTER — Ambulatory Visit (INDEPENDENT_AMBULATORY_CARE_PROVIDER_SITE_OTHER): Payer: Medicaid Other | Admitting: Orthopaedic Surgery

## 2018-06-04 ENCOUNTER — Encounter (INDEPENDENT_AMBULATORY_CARE_PROVIDER_SITE_OTHER): Payer: Self-pay | Admitting: Orthopaedic Surgery

## 2018-06-04 ENCOUNTER — Ambulatory Visit (INDEPENDENT_AMBULATORY_CARE_PROVIDER_SITE_OTHER): Payer: Medicaid Other

## 2018-06-04 DIAGNOSIS — M25552 Pain in left hip: Secondary | ICD-10-CM | POA: Diagnosis not present

## 2018-06-04 DIAGNOSIS — Z96642 Presence of left artificial hip joint: Secondary | ICD-10-CM | POA: Diagnosis not present

## 2018-06-04 NOTE — Progress Notes (Signed)
The patient is now 6 months status post a left total hip arthroplasty.  She is someone who is morbidly obese with a BMI of 45.9.  2 weeks ago tomorrow she underwent lumbar spine surgery due to symptomatic herniated disc.  She is staying in skilled nursing because of this.  She feels like that left lower extremity is weak but she has no significant issues with a hip replacement itself.  On exam I can put her left hip to internal extra rotation with no difficulty at all.  I did counsel her about weight loss given her morbid obesity.  From a hip standpoint she is doing well and they can continue attempts to mobilize her and get her moving.  From my standpoint I do not need to see her back for 6 months.  At that visit I would like a low AP pelvis and a lateral of her left hip but this can be done supine.

## 2018-06-20 ENCOUNTER — Other Ambulatory Visit: Payer: Self-pay

## 2018-06-20 ENCOUNTER — Observation Stay (HOSPITAL_COMMUNITY)
Admission: EM | Admit: 2018-06-20 | Discharge: 2018-06-21 | Disposition: A | Discharge status: Skilled Nursing Facility | Payer: Medicaid Other | Attending: Cardiology | Admitting: Cardiology

## 2018-06-20 ENCOUNTER — Emergency Department (HOSPITAL_COMMUNITY): Payer: Medicaid Other

## 2018-06-20 ENCOUNTER — Encounter (HOSPITAL_COMMUNITY): Payer: Self-pay | Admitting: Internal Medicine

## 2018-06-20 DIAGNOSIS — Z880 Allergy status to penicillin: Secondary | ICD-10-CM | POA: Insufficient documentation

## 2018-06-20 DIAGNOSIS — E119 Type 2 diabetes mellitus without complications: Secondary | ICD-10-CM | POA: Insufficient documentation

## 2018-06-20 DIAGNOSIS — Z7982 Long term (current) use of aspirin: Secondary | ICD-10-CM | POA: Insufficient documentation

## 2018-06-20 DIAGNOSIS — I2510 Atherosclerotic heart disease of native coronary artery without angina pectoris: Secondary | ICD-10-CM

## 2018-06-20 DIAGNOSIS — Z79899 Other long term (current) drug therapy: Secondary | ICD-10-CM | POA: Insufficient documentation

## 2018-06-20 DIAGNOSIS — Z823 Family history of stroke: Secondary | ICD-10-CM | POA: Insufficient documentation

## 2018-06-20 DIAGNOSIS — Z90710 Acquired absence of both cervix and uterus: Secondary | ICD-10-CM | POA: Diagnosis not present

## 2018-06-20 DIAGNOSIS — G4733 Obstructive sleep apnea (adult) (pediatric): Secondary | ICD-10-CM | POA: Insufficient documentation

## 2018-06-20 DIAGNOSIS — I200 Unstable angina: Secondary | ICD-10-CM | POA: Diagnosis present

## 2018-06-20 DIAGNOSIS — J45909 Unspecified asthma, uncomplicated: Secondary | ICD-10-CM | POA: Diagnosis not present

## 2018-06-20 DIAGNOSIS — Z981 Arthrodesis status: Secondary | ICD-10-CM | POA: Insufficient documentation

## 2018-06-20 DIAGNOSIS — E785 Hyperlipidemia, unspecified: Secondary | ICD-10-CM | POA: Diagnosis not present

## 2018-06-20 DIAGNOSIS — Z833 Family history of diabetes mellitus: Secondary | ICD-10-CM | POA: Insufficient documentation

## 2018-06-20 DIAGNOSIS — Z7901 Long term (current) use of anticoagulants: Secondary | ICD-10-CM | POA: Insufficient documentation

## 2018-06-20 DIAGNOSIS — R0789 Other chest pain: Secondary | ICD-10-CM | POA: Insufficient documentation

## 2018-06-20 DIAGNOSIS — Z96653 Presence of artificial knee joint, bilateral: Secondary | ICD-10-CM | POA: Diagnosis not present

## 2018-06-20 DIAGNOSIS — K754 Autoimmune hepatitis: Secondary | ICD-10-CM | POA: Diagnosis not present

## 2018-06-20 DIAGNOSIS — Z886 Allergy status to analgesic agent status: Secondary | ICD-10-CM | POA: Diagnosis not present

## 2018-06-20 DIAGNOSIS — E7800 Pure hypercholesterolemia, unspecified: Secondary | ICD-10-CM | POA: Insufficient documentation

## 2018-06-20 DIAGNOSIS — Z96642 Presence of left artificial hip joint: Secondary | ICD-10-CM | POA: Insufficient documentation

## 2018-06-20 DIAGNOSIS — F17210 Nicotine dependence, cigarettes, uncomplicated: Secondary | ICD-10-CM | POA: Diagnosis not present

## 2018-06-20 DIAGNOSIS — Z888 Allergy status to other drugs, medicaments and biological substances status: Secondary | ICD-10-CM | POA: Insufficient documentation

## 2018-06-20 DIAGNOSIS — Z8249 Family history of ischemic heart disease and other diseases of the circulatory system: Secondary | ICD-10-CM | POA: Insufficient documentation

## 2018-06-20 DIAGNOSIS — Z885 Allergy status to narcotic agent status: Secondary | ICD-10-CM | POA: Insufficient documentation

## 2018-06-20 DIAGNOSIS — D649 Anemia, unspecified: Secondary | ICD-10-CM | POA: Diagnosis not present

## 2018-06-20 DIAGNOSIS — Z87442 Personal history of urinary calculi: Secondary | ICD-10-CM | POA: Insufficient documentation

## 2018-06-20 DIAGNOSIS — Z86718 Personal history of other venous thrombosis and embolism: Secondary | ICD-10-CM | POA: Diagnosis not present

## 2018-06-20 DIAGNOSIS — I25118 Atherosclerotic heart disease of native coronary artery with other forms of angina pectoris: Secondary | ICD-10-CM | POA: Insufficient documentation

## 2018-06-20 DIAGNOSIS — R079 Chest pain, unspecified: Principal | ICD-10-CM | POA: Insufficient documentation

## 2018-06-20 DIAGNOSIS — Z6841 Body Mass Index (BMI) 40.0 and over, adult: Secondary | ICD-10-CM | POA: Insufficient documentation

## 2018-06-20 DIAGNOSIS — Z9049 Acquired absence of other specified parts of digestive tract: Secondary | ICD-10-CM | POA: Insufficient documentation

## 2018-06-20 DIAGNOSIS — Z791 Long term (current) use of non-steroidal anti-inflammatories (NSAID): Secondary | ICD-10-CM | POA: Insufficient documentation

## 2018-06-20 DIAGNOSIS — Z800 Family history of malignant neoplasm of digestive organs: Secondary | ICD-10-CM | POA: Insufficient documentation

## 2018-06-20 DIAGNOSIS — Z7951 Long term (current) use of inhaled steroids: Secondary | ICD-10-CM | POA: Diagnosis not present

## 2018-06-20 DIAGNOSIS — I10 Essential (primary) hypertension: Secondary | ICD-10-CM | POA: Diagnosis present

## 2018-06-20 DIAGNOSIS — Z7984 Long term (current) use of oral hypoglycemic drugs: Secondary | ICD-10-CM | POA: Insufficient documentation

## 2018-06-20 DIAGNOSIS — I252 Old myocardial infarction: Secondary | ICD-10-CM | POA: Diagnosis not present

## 2018-06-20 DIAGNOSIS — K219 Gastro-esophageal reflux disease without esophagitis: Secondary | ICD-10-CM | POA: Insufficient documentation

## 2018-06-20 LAB — COMPREHENSIVE METABOLIC PANEL
ALT: 19 U/L (ref 0–44)
AST: 23 U/L (ref 15–41)
Albumin: 3.5 g/dL (ref 3.5–5.0)
Alkaline Phosphatase: 103 U/L (ref 38–126)
Anion gap: 8 (ref 5–15)
BUN: 19 mg/dL (ref 8–23)
CO2: 23 mmol/L (ref 22–32)
CREATININE: 0.85 mg/dL (ref 0.44–1.00)
Calcium: 9.6 mg/dL (ref 8.9–10.3)
Chloride: 108 mmol/L (ref 98–111)
GFR calc Af Amer: >60 mL/min (ref >60)
GFR calc non Af Amer: >60 mL/min (ref >60)
Glucose, Bld: 136 mg/dL — ABNORMAL HIGH (ref 70–99)
Potassium: 4.4 mmol/L (ref 3.5–5.1)
Sodium: 139 mmol/L (ref 135–145)
Total Bilirubin: 0.5 mg/dL (ref 0.3–1.2)
Total Protein: 7.4 g/dL (ref 6.5–8.1)

## 2018-06-20 LAB — CBC
HEMATOCRIT: 34.6 % — AB (ref 36.0–46.0)
Hemoglobin: 10.3 g/dL — ABNORMAL LOW (ref 12.0–15.0)
MCH: 28.7 pg (ref 26.0–34.0)
MCHC: 29.8 g/dL — ABNORMAL LOW (ref 30.0–36.0)
MCV: 96.4 fL (ref 80.0–100.0)
Platelets: 238 10*3/uL (ref 150–400)
RBC: 3.59 MIL/uL — ABNORMAL LOW (ref 3.87–5.11)
RDW: 15.7 % — ABNORMAL HIGH (ref 11.5–15.5)
WBC: 5.1 10*3/uL (ref 4.0–10.5)
nRBC: 0 % (ref 0.0–0.2)

## 2018-06-20 LAB — I-STAT TROPONIN, ED: Troponin i, poc: 0.01 ng/mL (ref 0.00–0.08)

## 2018-06-20 LAB — GLUCOSE, CAPILLARY
Glucose-Capillary: 68 mg/dL — ABNORMAL LOW (ref 70–99)
Glucose-Capillary: 117 mg/dL — ABNORMAL HIGH (ref 70–99)

## 2018-06-20 LAB — TROPONIN I

## 2018-06-20 LAB — MRSA PCR SCREENING: MRSA by PCR: NEGATIVE

## 2018-06-20 LAB — LIPASE, BLOOD: Lipase: 36 U/L (ref 11–51)

## 2018-06-20 MED ORDER — BUPROPION HCL ER (XL) 150 MG PO TB24
150.0000 mg | ORAL_TABLET | Freq: Every day | ORAL | Status: DC
  Administered 2018-06-21: 150 mg via ORAL
  Filled 2018-06-20: qty 1

## 2018-06-20 MED ORDER — GABAPENTIN 300 MG PO CAPS: 300.0000 mg | ORAL_CAPSULE | Freq: Three times a day (TID) | ORAL | Status: DC | PRN

## 2018-06-20 MED ORDER — NITROGLYCERIN 0.4 MG SL SUBL
0.4000 mg | SUBLINGUAL_TABLET | Freq: Once | SUBLINGUAL | Status: AC
  Administered 2018-06-20: 0.4 mg via SUBLINGUAL
  Filled 2018-06-20: qty 1

## 2018-06-20 MED ORDER — LISINOPRIL 20 MG PO TABS
20.0000 mg | ORAL_TABLET | Freq: Every day | ORAL | Status: DC
  Administered 2018-06-21: 20 mg via ORAL
  Filled 2018-06-20: qty 1

## 2018-06-20 MED ORDER — INSULIN ASPART 100 UNIT/ML ~~LOC~~ SOLN: 0.0000 [IU] | Freq: Three times a day (TID) | SUBCUTANEOUS | Status: DC

## 2018-06-20 MED ORDER — UMECLIDINIUM BROMIDE 62.5 MCG/INH IN AEPB
1.0000 | INHALATION_SPRAY | Freq: Every day | RESPIRATORY_TRACT | Status: DC
  Administered 2018-06-21: 1 via RESPIRATORY_TRACT
  Filled 2018-06-20 (×2): qty 7

## 2018-06-20 MED ORDER — TIOTROPIUM BROMIDE MONOHYDRATE 18 MCG IN CAPS
18.0000 ug | ORAL_CAPSULE | Freq: Every day | RESPIRATORY_TRACT | Status: DC
  Filled 2018-06-20: qty 5

## 2018-06-20 MED ORDER — PANTOPRAZOLE SODIUM 40 MG PO TBEC
80.0000 mg | DELAYED_RELEASE_TABLET | Freq: Every day | ORAL | Status: DC
  Administered 2018-06-21: 80 mg via ORAL
  Filled 2018-06-20: qty 2

## 2018-06-20 MED ORDER — SODIUM CHLORIDE 0.9 % IV SOLN: 250.0000 mL | INTRAVENOUS | Status: DC | PRN

## 2018-06-20 MED ORDER — FESOTERODINE FUMARATE ER 8 MG PO TB24
8.0000 mg | ORAL_TABLET | Freq: Every day | ORAL | Status: DC
  Administered 2018-06-21: 8 mg via ORAL
  Filled 2018-06-20: qty 1

## 2018-06-20 MED ORDER — SODIUM CHLORIDE 0.9% FLUSH: 3.0000 mL | INTRAVENOUS | Status: DC | PRN

## 2018-06-20 MED ORDER — NITROGLYCERIN 2 % TD OINT
1.0000 [in_us] | TOPICAL_OINTMENT | Freq: Four times a day (QID) | TRANSDERMAL | Status: DC
  Administered 2018-06-20 – 2018-06-21 (×3): 1 [in_us] via TOPICAL
  Filled 2018-06-20 (×3): qty 1

## 2018-06-20 MED ORDER — SODIUM CHLORIDE 0.9% FLUSH
3.0000 mL | Freq: Two times a day (BID) | INTRAVENOUS | Status: DC
  Administered 2018-06-20 – 2018-06-21 (×2): 3 mL via INTRAVENOUS

## 2018-06-20 MED ORDER — RIVAROXABAN 20 MG PO TABS
20.0000 mg | ORAL_TABLET | Freq: Every day | ORAL | Status: DC
  Administered 2018-06-20: 20 mg via ORAL
  Filled 2018-06-20 (×2): qty 1

## 2018-06-20 MED ORDER — BECLOMETHASONE DIPROPIONATE 80 MCG/ACT IN AERS: 2.0000 | INHALATION_SPRAY | Freq: Two times a day (BID) | RESPIRATORY_TRACT | Status: DC

## 2018-06-20 MED ORDER — MULTIVITAMIN GUMMIES WOMENS PO CHEW: 1.0000 | CHEWABLE_TABLET | Freq: Every day | ORAL | Status: DC

## 2018-06-20 MED ORDER — FENOFIBRATE 160 MG PO TABS
160.0000 mg | ORAL_TABLET | Freq: Every evening | ORAL | Status: DC
  Administered 2018-06-20: 160 mg via ORAL
  Filled 2018-06-20: qty 1

## 2018-06-20 MED ORDER — AZATHIOPRINE 50 MG PO TABS
50.0000 mg | ORAL_TABLET | Freq: Every day | ORAL | Status: DC
  Administered 2018-06-21: 50 mg via ORAL
  Filled 2018-06-20: qty 1

## 2018-06-20 MED ORDER — ASPIRIN EC 81 MG PO TBEC
81.0000 mg | DELAYED_RELEASE_TABLET | Freq: Every day | ORAL | Status: DC
  Administered 2018-06-21: 81 mg via ORAL
  Filled 2018-06-20: qty 1

## 2018-06-20 MED ORDER — ONDANSETRON HCL 4 MG/2ML IJ SOLN: 4.0000 mg | Freq: Four times a day (QID) | INTRAMUSCULAR | Status: DC | PRN

## 2018-06-20 MED ORDER — AMLODIPINE BESYLATE 5 MG PO TABS
5.0000 mg | ORAL_TABLET | Freq: Every day | ORAL | Status: DC
  Administered 2018-06-21: 5 mg via ORAL
  Filled 2018-06-20: qty 1

## 2018-06-20 MED ORDER — BUDESONIDE 0.5 MG/2ML IN SUSP
0.5000 mg | Freq: Two times a day (BID) | RESPIRATORY_TRACT | Status: DC
  Administered 2018-06-20 – 2018-06-21 (×2): 0.5 mg via RESPIRATORY_TRACT
  Filled 2018-06-20 (×2): qty 2

## 2018-06-20 MED ORDER — ADULT MULTIVITAMIN W/MINERALS CH
1.0000 | ORAL_TABLET | Freq: Every day | ORAL | Status: DC
  Administered 2018-06-20 – 2018-06-21 (×2): 1 via ORAL
  Filled 2018-06-20 (×2): qty 1

## 2018-06-20 MED ORDER — METOPROLOL TARTRATE 50 MG PO TABS
50.0000 mg | ORAL_TABLET | Freq: Two times a day (BID) | ORAL | Status: DC
  Administered 2018-06-21: 50 mg via ORAL
  Filled 2018-06-20: qty 1

## 2018-06-20 MED ORDER — TIZANIDINE HCL 2 MG PO TABS
2.0000 mg | ORAL_TABLET | Freq: Three times a day (TID) | ORAL | Status: DC | PRN
  Filled 2018-06-20: qty 1

## 2018-06-20 MED ORDER — MECLIZINE HCL 25 MG PO TABS
25.0000 mg | ORAL_TABLET | Freq: Three times a day (TID) | ORAL | Status: DC | PRN
  Filled 2018-06-20: qty 1

## 2018-06-20 MED ORDER — NITROGLYCERIN 0.4 MG SL SUBL: 0.4000 mg | SUBLINGUAL_TABLET | SUBLINGUAL | Status: DC | PRN

## 2018-06-20 MED ORDER — OXYCODONE HCL 5 MG PO TABS
10.0000 mg | ORAL_TABLET | ORAL | Status: DC | PRN
  Administered 2018-06-20 – 2018-06-21 (×3): 10 mg via ORAL
  Filled 2018-06-20 (×3): qty 2

## 2018-06-20 MED ORDER — NITROGLYCERIN 2 % TD OINT
1.0000 [in_us] | TOPICAL_OINTMENT | Freq: Once | TRANSDERMAL | Status: AC
  Administered 2018-06-20: 1 [in_us] via TOPICAL
  Filled 2018-06-20: qty 1

## 2018-06-20 MED ORDER — ALBUTEROL SULFATE (2.5 MG/3ML) 0.083% IN NEBU: 2.5000 mg | INHALATION_SOLUTION | Freq: Four times a day (QID) | RESPIRATORY_TRACT | Status: DC | PRN

## 2018-06-20 NOTE — ED Triage Notes (Signed)
[facility]/o chest pain since around 0930 this morning. CP in center of chest and dull in nature. Given 324 asa and 2 doses nitroglycerin by EMS. Pain 7/10 prior to nitroglycerin administration. Reports 1/10 pain post nitro. Pt reports feeling "worn out and nauseous." Denies SOB/diaphoresis. Skin cool and dry. VSS at this time.

## 2018-06-20 NOTE — ED Notes (Signed)
Patient returned from xray.

## 2018-06-20 NOTE — ED Provider Notes (Signed)
[facility] EMERGENCY DEPARTMENT Provider Note   CSN: [id number] Arrival date & time:        History   Chief Complaint Chief Complaint  Patient presents with  . Chest Pain    HPI Nancy Simmons is a 62 y.o. female.  HPI   Nancy Simmons is a 62 y.o. female, with a history of asthma, autoimmune hepatitis, HTN, DM, hypercholesterolemia, and GERD, presenting to the ED with chest pain beginning this morning around 9:30 AM.  States she was at rest when she began to feel central, lower chest pain, dull, 7/10, constant, intermittently radiating toward the left shoulder and the abdomen.  Accompanied by nausea and fatigue.  She was given 324 mg aspirin and two 0.4 mg doses of sublingual NTG with improvement in her pain to 1/10.  States she has had this type of pain before.  Denies fever, cough, diaphoresis, syncope, shortness of breath, vomiting, diarrhea, or any other complaints.    Past Medical History:  Diagnosis Date  . Arthritis    "pretty much all over" (04/03/2017)  . Asthma   . Autoimmune hepatitis (HCC)   . Coronary artery disease    a. mild-mod by 03/2017 following false positive nuc - She subsequently underwent cath with 30% LM, 30% ostial LAD, 45% mLAD, 50% dLAD, 30% prox RCA, no flow limiting lesions, LVEF 55-65%.   . Family history of adverse reaction to anesthesia    "granddaughter has PONV"  . GERD (gastroesophageal reflux disease)   . High cholesterol   . History of blood transfusion    "in [place]; related to knee surgeries"  . History of kidney stones   . Hypertension   . OSA on CPAP    CPAP, pressure settings 11  . Type II diabetes mellitus [facility])     Patient Active Problem List   Diagnosis Date Noted  . Chest pain, unspecified 06/20/2018  . CAD in native artery 06/20/2018  . Morbid obesity (HCC) 06/20/2018  . History of DVT (deep vein thrombosis) 06/20/2018  . Lumbar herniated disc 05/22/2018  . Status post total replacement of left  hip 12/10/2017  . Unilateral primary osteoarthritis, left hip 11/04/2017  . Abnormal stress echocardiography   . Stable angina (HCC) 04/03/2017  . DM2 (diabetes mellitus, type 2) (HCC) 04/03/2017  . Asthma 04/03/2017  . OSA on CPAP 04/03/2017  . Angina at rest [facility]) 04/03/2017  . Medication monitoring encounter 07/09/2016  . Autoimmune hepatitis (HCC) 07/09/2016  . 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 HTN (hypertension) 09/27/2015  . CAD (coronary artery disease) of artery bypass graft 09/27/2015  . Hypercholesterolemia 09/27/2015  . S/P cholecystectomy 09/27/2015    Past Surgical History:  Procedure Laterality Date  . ABDOMINAL HYSTERECTOMY    . BACK SURGERY    . CARDIAC CATHETERIZATION    . CARPAL TUNNEL RELEASE Bilateral    "done in [place]"  . CHOLECYSTECTOMY    . INCISION AND DRAINAGE Right 2015   S/P knee revision  . INTRAVASCULAR PRESSURE WIRE/FFR STUDY N/A 04/05/2017   Procedure: INTRAVASCULAR PRESSURE WIRE/FFR STUDY;  Surgeon: Marykay Lex, MD;  Location: [facility] INVASIVE CV LAB;  Service: Cardiovascular;  Laterality: N/A;  . JOINT REPLACEMENT    . LEFT HEART CATH AND CORONARY ANGIOGRAPHY N/A 04/05/2017   Procedure: LEFT HEART CATH AND CORONARY ANGIOGRAPHY;  Surgeon: Marykay Lex, MD;  Location: [facility] INVASIVE CV LAB;  Service: Cardiovascular;  Laterality: N/A;  . LUMBAR LAMINECTOMY/DECOMPRESSION MICRODISCECTOMY Left  05/22/2018   Procedure: MICRODISCECTOMY LUMBAR TWOLUMBAR THREE;  Surgeon: Tressie Stalker, MD;  Location: [facility] OR;  Service: Neurosurgery;  Laterality: Left;  . POSTERIOR FUSION LUMBAR SPINE  05/2010  . REVISION TOTAL KNEE ARTHROPLASTY Right 2015  . SHOULDER ARTHROSCOPY WITH ROTATOR CUFF REPAIR Right   . SHOULDER OPEN ROTATOR CUFF REPAIR Left    "done in [place]"  . TOTAL HIP ARTHROPLASTY Left 12/10/2017   Procedure: LEFT TOTAL HIP ARTHROPLASTY ANTERIOR APPROACH;  Surgeon: Kathryne Hitch, MD;  Location: [facility] OR;  Service:  Orthopedics;  Laterality: Left;  . TOTAL KNEE ARTHROPLASTY Bilateral 1990s     OB History   None      Home Medications    Prior to Admission medications   Medication Sig Start Date End Date Taking? Authorizing Provider  albuterol (PROVENTIL) (2.5 MG/3ML) 0.083% nebulizer solution Take 2.5 mg by nebulization every 6 (six) hours as needed for wheezing or shortness of breath.   Yes [provider]  amLODipine (NORVASC) 5 MG tablet Take 5 mg by mouth daily.   Yes [provider]  aspirin EC 81 MG tablet Take 81 mg by mouth daily.   Yes [provider]  azaTHIOprine (IMURAN) 50 MG tablet Take 50 mg by mouth daily.    Yes [provider]  beclomethasone (QVAR) 80 MCG/ACT inhaler Inhale 2 puffs into the lungs 2 (two) times daily.    Yes [provider]  buPROPion (WELLBUTRIN XL) 150 MG 24 hr tablet Take 150 mg by mouth daily.   Yes [provider]  Choline Fenofibrate (TRILIPIX) 135 MG capsule Take 135 mg by mouth every evening.    Yes [provider]  diclofenac sodium (VOLTAREN) 1 % GEL Apply 1 application topically 4 (four) times daily as needed (for pain).   Yes [provider]  fesoterodine (TOVIAZ) 8 MG TB24 tablet Take 8 mg by mouth daily.   Yes [provider]  gabapentin (NEURONTIN) 300 MG capsule Take 300 mg by mouth every 8 (eight) hours as needed (muscle spasms).    Yes [provider]  Ipratropium-Albuterol (COMBIVENT RESPIMAT) 20-100 MCG/ACT AERS respimat Inhale 1 puff into the lungs every 6 (six) hours as needed for wheezing or shortness of breath.    Yes [provider]  lisinopril (PRINIVIL,ZESTRIL) 20 MG tablet Take 20 mg by mouth daily.   Yes [provider]  Meclizine HCl 25 MG CHEW Chew 25 mg by mouth 3 (three) times daily as needed for dizziness.    Yes [provider]  meloxicam (MOBIC) 7.5 MG tablet Take 15 mg by mouth daily.    Yes [provider]    metFORMIN (GLUCOPHAGE) 500 MG tablet Take 500 mg by mouth 2 (two) times daily with a meal.    Yes [provider]  metoprolol tartrate (LOPRESSOR) 50 MG tablet Take 50 mg by mouth 2 (two) times daily.    Yes [provider]  Multiple Vitamins-Minerals (MULTIVITAMIN GUMMIES WOMENS PO) Take 2 each by mouth daily.   Yes [provider]  omeprazole (PRILOSEC) 20 MG capsule Take 40 mg by mouth every morning.   Yes [provider]  Oxycodone HCl 10 MG TABS Take 1 tablet (10 mg total) by mouth every 4 (four) hours as needed ((score 7 to 10)). 05/26/18  Yes Tressie Stalker, MD  rivaroxaban (XARELTO) 20 MG TABS tablet Take 20 mg by mouth daily with supper.   Yes [provider]  tiotropium (SPIRIVA) 18 MCG inhalation capsule  Place 18 mcg into inhaler and inhale daily.   Yes [provider]  tiZANidine (ZANAFLEX) 2 MG tablet Take 1 tablet (2 mg total) by mouth every 8 (eight) hours as needed for muscle spasms. 05/26/18  Yes Tressie Stalker, MD    Family History Family History  Problem Relation Age of Onset  . CAD Mother   . Heart disease Mother        Enlarged heart  . CAD Father   . Diabetes Father   . Heart failure Father   . CAD Brother   . Diabetes Brother   . Heart disease Brother        Defibrillator  . Colon cancer Other   . Stroke Other     Social History Social History   Tobacco Use  . Smoking status: Current Every Day Smoker    Packs/day: 0.50    Years: 44.00    Pack years: 22.00    Types: Cigarettes    Start date: 07/23/1972    Last attempt to quit: 12/06/2017    Years since quitting: 0.5  . Smokeless tobacco: Never Used  . Tobacco comment: 1 pack of cigarettes every 3 days  Substance Use Topics  . Alcohol use: No    Alcohol/week: 0.0 standard drinks  . Drug use: Not Currently    Types: "Crack" cocaine    Comment: Remote h/o cocaine use     Allergies   Acetaminophen and Penicillin g   Review of Systems Review  of Systems  Constitutional: Negative for chills, diaphoresis and fever.  Respiratory: Negative for cough and shortness of breath.   Cardiovascular: Positive for chest pain. Negative for leg swelling.  Gastrointestinal: Positive for nausea. Negative for diarrhea and vomiting.  Genitourinary: Negative for dysuria, flank pain and hematuria.  All other systems reviewed and are negative.    Physical Exam Updated Vital Signs BP 140/78   Pulse (!) 59   Resp 16   Ht 5\' 2"  (1.575 m)   Wt 123.8 kg   SpO2 97%   BMI 49.93 kg/m   Physical Exam  Constitutional: She appears well-developed and well-nourished. No distress.  HENT:  Head: Normocephalic and atraumatic.  Eyes: Conjunctivae are normal.  Neck: Neck supple.  Cardiovascular: Normal rate, regular rhythm, normal heart sounds and intact distal pulses.  Pulses:      Dorsalis pedis pulses are 2+ on the right side, and 2+ on the left side.       Posterior tibial pulses are 2+ on the right side, and 2+ on the left side.  Pulmonary/Chest: Effort normal and breath sounds normal. No respiratory distress.  Abdominal: Soft. There is no tenderness. There is no guarding.  No tenderness whatsoever in the abdomen.  Musculoskeletal: She exhibits no edema.  Lymphadenopathy:    She has no cervical adenopathy.  Neurological: She is alert.  Sensation to light touch grossly intact in the extremities. Strength 5/5 in the extremities.  Skin: Skin is warm and dry. She is not diaphoretic.  Psychiatric: She has a normal mood and affect. Her behavior is normal.  Nursing note and vitals reviewed.    ED Treatments / Results  Labs (all labs ordered are listed, but only abnormal results are displayed) Labs Reviewed  CBC - Abnormal; Notable for the following components:      Result Value   RBC 3.59 (*)    Hemoglobin 10.3 (*)    HCT 34.6 (*)    MCHC 29.8 (*)    RDW 15.7 (*)  All other components within normal limits  COMPREHENSIVE METABOLIC PANEL -  Abnormal; Notable for the following components:   Glucose, Bld 136 (*)    All other components within normal limits  LIPASE, BLOOD  I-STAT TROPONIN, ED    EKG EKG Interpretation  Date/Time:  Friday June 20 2018 13:49:16 EST Ventricular Rate:  72 PR Interval:    QRS Duration: 96 QT Interval:  393 QTC Calculation: 431 R Axis:   58 Text Interpretation:  Sinus rhythm Anterior infarct, old Confirmed by Margarita Grizzleay, Danielle [phone]([id number]) on 06/20/2018 1:52:44 PM   Radiology Dg Chest 2 View  Result Date: 06/20/2018 CLINICAL DATA:  Acute onset chest pain that began approximately 9:30 a.m. this morning. EXAM: CHEST - 2 VIEW COMPARISON:  04/02/2017, 04/01/2017, 10/04/2016 and earlier. FINDINGS: Cardiac silhouette mildly enlarged, unchanged. Hilar and mediastinal contours otherwise unremarkable. Lungs clear. Mild pulmonary venous hypertension without evidence of overt pulmonary edema. No pleural effusions. Degenerative changes and DISH involving the thoracic spine. IMPRESSION: Stable cardiomegaly.  No acute cardiopulmonary disease. Electronically Signed   By: Hulan Saashomas  Lawrence M.D.   On: 06/20/2018 11:18    Procedures Procedures (including critical care time)  Medications Ordered in ED Medications  nitroGLYCERIN (NITROSTAT) SL tablet 0.4 mg (0.4 mg Sublingual Given 06/20/18 1115)  nitroGLYCERIN (NITROGLYN) 2 % ointment 1 inch (1 inch Topical Given 06/20/18 1224)     Initial Impression / Assessment and Plan / ED Course  I have reviewed the triage vital signs and the nursing notes.  Pertinent labs & imaging results that were available during my care of the patient were reviewed by me and considered in my medical decision making (see chart for details).  Clinical Course as of Jun 21 1355  Fri Jun 20, 2018  1138 Patient's pain has resolved.   [SJ]  1209 Spoke with Cardiology. States they will come see the patient.    [SJ]    Clinical Course User Index [SJ] Joy, Shawn C, PA-C   Patient  presents with chest pain that came on at rest.  It improved and then resolved with nitroglycerin.  Pain has elements that give concern for unstable angina.  HEART score is 4-5, indicating moderate risk for a cardiac event.  Admitted for further observation.  Findings and plan of care discussed with Margarita Grizzleanielle Ray, MD.   Vitals:   06/20/18 1145 06/20/18 1200 06/20/18 1215 06/20/18 1230  BP: 129/77 134/70 131/85 140/78  Pulse: (!) 56 (!) 59 (!) 59 (!) 59  Resp: 17 16 14 16   SpO2: 98% 100% 99% 97%  Weight:      Height:         Final Clinical Impressions(s) / ED Diagnoses   Final diagnoses:  Unstable angina [facility](HCC)    ED Discharge Orders    None       Anselm PancoastJoy, Shawn C, PA-C 06/20/18 1358    Margarita Grizzleay, Danielle, MD 06/21/18 [phone]

## 2018-06-20 NOTE — H&P (Addendum)
Cardiology History & Physical    Patient ID: KEYONA EMRICH MRN: [id number], DOB: Sep 11, 1955 Date of Encounter: 06/20/2018, 12:58 PM Primary Physician: Wilmer Floor., MD Primary Cardiologist: No primary care provider on file. - seen by Dr. Mayford Knife in 03/2017, but typically resides in [place] area so will need to establish there. Primary Electrophysiologist:  None  Chief Complaint: chest pain Reason for Admission: chest pain Requesting MD: Harolyn Rutherford, PA-C  HPI: STEVIE ERTLE is a 62 y.o. 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  She recalls having similar symptoms about 5 years ago when she was diagnosed with a bout of autoimmune hepatitis. She was previously admitted to [facility] in 03/2017 with a sharper type of chest pain. A cardiac stress test was done which was positive for reversible ischemia of the anterior septal and inferior lateral wall. She subsequently underwent cath with 30% LM, 30% ostial LAD, 45% mLAD, 50% dLAD, 30% prox RCA, no flow limiting lesions, LVEF 55-65%. She recently underwent planned L2-3 discectomy for lumbar herniated disc 05/22/18, post-op course appeared unremarkable. She was discharged to [facility] rehab and had actually progressed with therapy well enough in the last few weeks that she was slated to be discharged from their program soon. She had not had any recent exertional anginal sx. One of her grandchildren was born yesterday. Today while at rest around 10:30am she developed sudden substernal chest discomfort described as a dull ache associated with nausea. It migrated to epigastric region and left shoulder blade. She  went and laid down and felt somewhat better. However, due to persistent sx she alerted facility staff. She was given 2 SL NTG a few minutes apart, each prompting some relief. When she got to the ER she was given an additional SL NTG which afforded more relief. She has since had a NTG patch placed and remains pain free. She denies any pleuritic component to her pain. No palpitations, diaphoresis, vomiting, or any other recent changes in symptoms otherwise. She continues to smoke but has been using Wellbutrin and has cut down significantly. ER workup notable for negative troponin x 1, Hgb 10.3 c/w prior, glucose 136 but otherwise normal CMET and lipase. VSS - not tachycardic, tachypneic or hypoxic.    Past Medical History:  Diagnosis Date  . Arthritis    "pretty much all over" (04/03/2017)  . Asthma   . Autoimmune hepatitis (HCC)   . Coronary artery disease    a. mild-mod by 03/2017 following false positive nuc - She subsequently underwent cath with 30% LM, 30% ostial LAD, 45% mLAD, 50% dLAD, 30% prox RCA, no flow limiting lesions, LVEF 55-65%.   . Family history of adverse reaction to anesthesia    "granddaughter has PONV"  . GERD (gastroesophageal reflux disease)   . High cholesterol   . History of blood transfusion    "in [place]; related to knee surgeries"  . History of kidney stones   . Hypertension   . OSA on CPAP    CPAP, pressure settings 11  . Type II diabetes mellitus (HCC)      Surgical History:  Past Surgical History:  Procedure Laterality Date  . ABDOMINAL HYSTERECTOMY    . BACK  SURGERY    . CARDIAC CATHETERIZATION    . CARPAL TUNNEL RELEASE Bilateral    "done in [place]"  . CHOLECYSTECTOMY    . INCISION AND DRAINAGE Right 2015   S/P knee revision  . INTRAVASCULAR PRESSURE WIRE/FFR STUDY N/A 04/05/2017   Procedure: INTRAVASCULAR PRESSURE WIRE/FFR STUDY;  Surgeon: Marykay LexHarding, David W, MD;  [facility];  Service: Cardiovascular;  Laterality: N/A;  . JOINT  REPLACEMENT    . LEFT HEART CATH AND CORONARY ANGIOGRAPHY N/A 04/05/2017   Procedure: LEFT HEART CATH AND CORONARY ANGIOGRAPHY;  Surgeon: Marykay LexHarding, David W, MD;  [facility];  Service: Cardiovascular;  Laterality: N/A;  . LUMBAR LAMINECTOMY/DECOMPRESSION MICRODISCECTOMY Left 05/22/2018   Procedure: MICRODISCECTOMY LUMBAR TWOLUMBAR THREE;  Surgeon: Tressie StalkerJenkins, Jeffrey, MD;  [facility];  Service: Neurosurgery;  Laterality: Left;  . POSTERIOR FUSION LUMBAR SPINE  05/2010  . REVISION TOTAL KNEE ARTHROPLASTY Right 2015  . SHOULDER ARTHROSCOPY WITH ROTATOR CUFF REPAIR Right   . SHOULDER OPEN ROTATOR CUFF REPAIR Left    "done in [place]"  . TOTAL HIP ARTHROPLASTY Left 12/10/2017   Procedure: LEFT TOTAL HIP ARTHROPLASTY ANTERIOR APPROACH;  Surgeon: Kathryne HitchBlackman, Christopher Y, MD;  Location: [facility] OR;  Service: Orthopedics;  Laterality: Left;  . TOTAL KNEE ARTHROPLASTY Bilateral 1990s     Home Meds: Prior to Admission medications   Medication Sig Start Date End Date Taking? Authorizing Provider  amLODipine (NORVASC) 5 MG tablet Take 5 mg by mouth daily.   Yes [provider]  aspirin EC 81 MG tablet Take 81 mg by mouth daily.   Yes [provider]  azaTHIOprine (IMURAN) 50 MG tablet Take 50 mg by mouth daily.    Yes [provider]  buPROPion (WELLBUTRIN XL) 150 MG 24 hr tablet Take 150 mg by mouth daily.   Yes [provider]  lisinopril (PRINIVIL,ZESTRIL) 20 MG tablet Take 20 mg by mouth daily.   Yes [provider]  meloxicam (MOBIC) 7.5 MG tablet Take 15 mg by mouth daily.    Yes [provider]  Multiple Vitamins-Minerals (MULTIVITAMIN GUMMIES WOMENS PO) Take 2 each by mouth daily.   Yes [provider]  albuterol (PROVENTIL) (2.5 MG/3ML) 0.083% nebulizer solution Take 2.5 mg by nebulization every 6 (six) hours as needed for wheezing or shortness of breath.    [provider]  apixaban (ELIQUIS) 5 MG TABS tablet  Take 5 mg by mouth 2 (two) times daily.    [provider]  beclomethasone (QVAR) 80 MCG/ACT inhaler Inhale 2 puffs into the lungs 2 (two) times daily.     [provider]  Choline Fenofibrate (TRILIPIX) 135 MG capsule Take 135 mg by mouth daily.    [provider]  diclofenac sodium (VOLTAREN) 1 % GEL Apply 1 application topically 4 (four) times daily as needed (for pain).    [provider]  fesoterodine (TOVIAZ) 8 MG TB24 tablet Take 8 mg by mouth daily.    [provider]  gabapentin (NEURONTIN) 300 MG capsule Take 300 mg by mouth 3 (three) times daily.     [provider]  Ipratropium-Albuterol (COMBIVENT RESPIMAT) 20-100 MCG/ACT AERS respimat Inhale 1 puff into the lungs every 6 (six) hours as needed for wheezing or shortness of breath.     [provider]  Meclizine HCl 25 MG CHEW Chew 25 mg by mouth 3 (three) times daily as needed for dizziness.     [provider]  metFORMIN (GLUCOPHAGE)  500 MG tablet Take 500 mg by mouth 2 (two) times daily with a meal.     [provider]  metoprolol tartrate (LOPRESSOR) 50 MG tablet Take 50 mg by mouth 2 (two) times daily.     [provider]  Oxycodone HCl 10 MG TABS Take 1 tablet (10 mg total) by mouth every 4 (four) hours as needed ((score 7 to 10)). 05/26/18   Tressie Stalker, MD  pantoprazole (PROTONIX) 40 MG tablet Take 40 mg by mouth daily.    [provider]  tiotropium (SPIRIVA) 18 MCG inhalation capsule Place 18 mcg into inhaler and inhale daily.    [provider]  tiZANidine (ZANAFLEX) 2 MG tablet Take 1 tablet (2 mg total) by mouth every 8 (eight) hours as needed for muscle spasms. 05/26/18   Tressie Stalker, MD    Allergies:  Allergies  Allergen Reactions  . Acetaminophen Other (See Comments)    PATIENT HAS AUTOIMMUNE HEPATITIS PATIENT IS * NOT * TO RECEIVE ANY ACETAMINOPHEN  . Penicillin G Rash and Other (See Comments)    Has  patient had a PCN reaction causing immediate rash, facial/tongue/throat swelling, SOB or lightheadedness with hypotension: No Has patient had a PCN reaction causing severe rash involving mucus membranes or skin necrosis: No Has patient had a PCN reaction that required hospitalization: No Has patient had a PCN reaction occurring within the last 10 years: #  #  #  YES  #  #  #     Social History   Socioeconomic History  . Marital status: Single    Spouse name: Not on file  . Number of children: Not on file  . Years of education: Not on file  . Highest education level: Not on file  Occupational History  . Not on file  Social Needs  . Financial resource strain: Not on file  . Food insecurity:    Worry: Not on file    Inability: Not on file  . Transportation needs:    Medical: Not on file    Non-medical: Not on file  Tobacco Use  . Smoking status: Current Every Day Smoker    Packs/day: 0.50    Years: 44.00    Pack years: 22.00    Types: Cigarettes    Start date: 07/23/1972    Last attempt to quit: 12/06/2017    Years since quitting: 0.5  . Smokeless tobacco: Never Used  . Tobacco comment: 1 pack of cigarettes every 3 days  Substance and Sexual Activity  . Alcohol use: No    Alcohol/week: 0.0 standard drinks  . Drug use: Not Currently    Types: "Crack" cocaine    Comment: Remote h/o cocaine use  . Sexual activity: Not Currently  Lifestyle  . Physical activity:    Days per week: Not on file    Minutes per session: Not on file  . Stress: Not on file  Relationships  . Social connections:    Talks on phone: Not on file    Gets together: Not on file    Attends religious service: Not on file    Active member of club or organization: Not on file    Attends meetings of clubs or organizations: Not on file    Relationship status: Not on file  . Intimate partner violence:    Fear of current or ex partner: Not on file    Emotionally abused: Not on file    Physically abused: Not  on file  Forced sexual activity: Not on file  Other Topics Concern  . Not on file  Social History Narrative  . Not on file     Family History  Problem Relation Age of Onset  . CAD Mother   . Heart disease Mother        Enlarged heart  . CAD Father   . Diabetes Father   . Heart failure Father   . CAD Brother   . Diabetes Brother   . Heart disease Brother        Defibrillator  . Colon cancer Other   . Stroke Other     Review of Systems: All other systems reviewed and are otherwise negative except as noted above.  Telemetry unremarkable thus far.  Labs:   Lab Results  Component Value Date   WBC 5.1 06/20/2018   HGB 10.3 (L) 06/20/2018   HCT 34.6 (L) 06/20/2018   MCV 96.4 06/20/2018   PLT 238 06/20/2018    Recent Labs  Lab 06/20/18 1055  NA 139  K 4.4  CL 108  CO2 23  BUN 19  CREATININE 0.85  CALCIUM 9.6  PROT 7.4  BILITOT 0.5  ALKPHOS 103  ALT 19  AST 23  GLUCOSE 136*   No results for input(s): CKTOTAL, CKMB, TROPONINI in the last 72 hours. Lab Results  Component Value Date   CHOL 115 04/05/2017   HDL 35 (L) 04/05/2017   LDLCALC 66 04/05/2017   TRIG 71 04/05/2017   No results found for: DDIMER  Radiology/Studies:  Dg Chest 2 View  Result Date: 06/20/2018 CLINICAL DATA:  Acute onset chest pain that began approximately 9:30 a.m. this morning. EXAM: CHEST - 2 VIEW COMPARISON:  04/02/2017, 04/01/2017, 10/04/2016 and earlier. FINDINGS: Cardiac silhouette mildly enlarged, unchanged. Hilar and mediastinal contours otherwise unremarkable. Lungs clear. Mild pulmonary venous hypertension without evidence of overt pulmonary edema. No pleural effusions. Degenerative changes and DISH involving the thoracic spine. IMPRESSION: Stable cardiomegaly.  No acute cardiopulmonary disease. Electronically Signed   By: Hulan Saas M.D.   On: 06/20/2018 11:18   Dg Lumbar Spine 1 View  Result Date: 05/22/2018 CLINICAL DATA:  L2-3 micro discectomy. EXAM: LUMBAR  SPINE - 1 VIEW COMPARISON:  Radiographs of April 04, 2018. MRI of April 11, 2018. FINDINGS: Single intraoperative cross-table lateral projection of the lumbar spine demonstrates a surgical probe directed toward the posterior elements of L2. IMPRESSION: Surgical localization as described above. Electronically Signed   By: Lupita Raider, M.D.   On: 05/22/2018 14:40   Xr Hip Unilat W Or W/o Pelvis 1v Left  Result Date: 06/04/2018 Standing AP pelvis and lateral of the left hip shows a well-seated total hip arthroplasty with no complicating features.  There is been a change compared to previous films there were standing films from 2 months ago.  Wt Readings from Last 3 Encounters:  06/20/18 123.8 kg  05/22/18 129.1 kg  05/13/18 129.1 kg   EKG: NSR 60bpm, anterior Q waves V1-V3 (previous EKGs showing V1-V2), nonspecific inferior TW changes, low voltage, not acutely change from prior.  Physical Exam: Blood pressure 140/78, pulse (!) 59, resp. rate 16, height 5\' 2"  (1.575 m), weight 123.8 kg, SpO2 97 %. Body mass index is 49.93 kg/m. General: Well developed, well nourished morbidly obese AAF in no acute distress. Head: Normocephalic, atraumatic, sclera non-icteric, no xanthomas, nares are without discharge.  Neck: Negative for carotid bruits. JVD not elevated. Lungs: Clear bilaterally to auscultation without wheezes, rales, or rhonchi. Breathing  is unlabored. Heart: RRR with S1 S2. No murmurs, rubs, or gallops appreciated. Abdomen: Soft, non-tender, non-distended with normoactive bowel sounds. No hepatomegaly. No rebound/guarding. No obvious abdominal masses. Msk:  Strength and tone appear normal for age. Extremities: No clubbing or cyanosis. No edema.  Distal pedal pulses are 2+ and equal bilaterally. Neuro: Alert and oriented X 3. No focal deficit. No facial asymmetry. Moves all extremities spontaneously. Psych:  Responds to questions appropriately with a normal affect.    Assessment  and Plan   1. Chest pain - difficult to know etiology, occurred at rest but no objective evidence of ischemia thus far. It was SL NTG responsive. Cath results noted above. Plan to admit to observation, cycle troponins, and continue NTG paste overnight. If troponins are negative overnight and patient remains pain free, will ask nursing to remove NTG paste and ambulate patient tomorrow. If she remains pain free and feels better, anticipate discharge. She may be able to go directly home from [facility] instead of back to SNF as she was nearing discharge there. Will consult social work to assist with this process as her belongings are still there. Will also hold NSAID acutely.  2. Nonobstructive CAD in 03/2017 - continue ASA for now along with BB. Not clear why patient is not on statin given DM and HLD, but may be related to h/o autoimmune hepatitis. This can be clarified in the outpatient setting. Will update lipids.  3. Morbid obesity - as she gradually increases activity at home will be important to work on [employer].  4. 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 No clinical evidence of VTE. Will continue Xarelto going into the weekend, but if there is evidence of ischemia this will need to be held and she will need to go on heparin instead.  5. HTN - controlled.  6. Diabetes mellitus - A1C 5.3 in 05/2018. Hold metformin as inpatient in case procedures are needed, and add SSI.  7. Autoimmune hepatitis - continue home regimen. No acute change in LFTs. Will trend.  Severity of Illness: The appropriate patient status for this patient is OBSERVATION. Observation status is judged to be reasonable and necessary in order to provide the required intensity of service to ensure the patient's safety. The patient's presenting symptoms, physical exam findings, and initial radiographic and laboratory data in the context of their medical condition is felt to place them at decreased risk  for further clinical deterioration. Furthermore, it is anticipated that the patient will be medically stable for discharge from the hospital within 2 midnights of admission. The following factors support the patient status of observation.   " The patient's presenting symptoms include chest pain. " The physical exam findings include morbid obesity. " The initial radiographic and laboratory data are negative for acute abnormality     For questions or updates, please contact [facility] Please consult [email or web address] for contact info under Cardiology/STEMI.  Signed, Laurann Montana, PA-C 06/20/2018, 12:58 PM  History and all data above reviewed.  Patient examined.  I agree with the findings as above.  The patient presents for evaluation of chest pain.  This is described as above.  It is substernal and radiating down to the upper epigastric area.  There it was moderately severe and improved after 2 NTG.  She has no acute findings with negative troponin and no acute EKG findings.  There is poor anterior R wave progression probable lead placement with new Q waves compared to previous.  Cath last fall as above.  l The patient exam reveals COR:RRR  ,  Lungs: Clear  ,  Abd: Positive bowel sounds, no rebound no guarding, Ext No edema  .  All available labs, radiology testing, previous records reviewed. Agree with documented assessment and plan.  Observation overnight and if negative enzymes then no further work up.  OK to go home in the AM.    Rollene Rotunda  3:16 PM  06/20/2018

## 2018-06-20 NOTE — ED Notes (Signed)
Patient transported to X-ray 

## 2018-06-20 NOTE — Progress Notes (Signed)
Gave patient a sandwich and ginger ale for  68 blood sugar

## 2018-06-20 NOTE — ED Notes (Signed)
Pt ambulatory to restroom with minimal assistance from this NT. Pt stated she typically uses a walker at home.

## 2018-06-20 NOTE — ED Notes (Signed)
Cardiology @ bedside.

## 2018-06-21 DIAGNOSIS — Z86718 Personal history of other venous thrombosis and embolism: Secondary | ICD-10-CM | POA: Diagnosis not present

## 2018-06-21 DIAGNOSIS — R0789 Other chest pain: Secondary | ICD-10-CM | POA: Diagnosis not present

## 2018-06-21 LAB — CBC
HCT: 32.1 % — ABNORMAL LOW (ref 36.0–46.0)
HEMOGLOBIN: 9.8 g/dL — AB (ref 12.0–15.0)
MCH: 28.8 pg (ref 26.0–34.0)
MCHC: 30.5 g/dL (ref 30.0–36.0)
MCV: 94.4 fL (ref 80.0–100.0)
Platelets: 243 10*3/uL (ref 150–400)
RBC: 3.4 MIL/uL — AB (ref 3.87–5.11)
RDW: 15.8 % — ABNORMAL HIGH (ref 11.5–15.5)
WBC: 5.6 10*3/uL (ref 4.0–10.5)
nRBC: 0 % (ref 0.0–0.2)

## 2018-06-21 LAB — LIPID PANEL
Cholesterol: 135 mg/dL (ref 0–200)
HDL: 30 mg/dL — AB (ref >40)
LDL Cholesterol: 82 mg/dL (ref 0–99)
Total CHOL/HDL Ratio: 4.5 RATIO
Triglycerides: 117 mg/dL (ref <150)
VLDL: 23 mg/dL (ref 0–40)

## 2018-06-21 LAB — HEPATIC FUNCTION PANEL
ALT: 17 U/L (ref 0–44)
AST: 21 U/L (ref 15–41)
Albumin: 3.2 g/dL — ABNORMAL LOW (ref 3.5–5.0)
Alkaline Phosphatase: 95 U/L (ref 38–126)
Bilirubin, Direct: 0.1 mg/dL (ref 0.0–0.2)
Indirect Bilirubin: 0.5 mg/dL (ref 0.3–0.9)
Total Bilirubin: 0.6 mg/dL (ref 0.3–1.2)
Total Protein: 6.9 g/dL (ref 6.5–8.1)

## 2018-06-21 LAB — GLUCOSE, CAPILLARY
Glucose-Capillary: 91 mg/dL (ref 70–99)
Glucose-Capillary: 108 mg/dL — ABNORMAL HIGH (ref 70–99)

## 2018-06-21 LAB — BASIC METABOLIC PANEL
Anion gap: 10 (ref 5–15)
BUN: 17 mg/dL (ref 8–23)
CO2: 24 mmol/L (ref 22–32)
CREATININE: 0.89 mg/dL (ref 0.44–1.00)
Calcium: 9.4 mg/dL (ref 8.9–10.3)
Chloride: 106 mmol/L (ref 98–111)
GFR calc Af Amer: >60 mL/min (ref >60)
GFR calc non Af Amer: >60 mL/min (ref >60)
Glucose, Bld: 90 mg/dL (ref 70–99)
Potassium: 4.1 mmol/L (ref 3.5–5.1)
Sodium: 140 mmol/L (ref 135–145)

## 2018-06-21 LAB — HIV ANTIBODY (ROUTINE TESTING W REFLEX): HIV Screen 4th Generation wRfx: NONREACTIVE

## 2018-06-21 LAB — TROPONIN I: Troponin I: <0.03 ng/mL (ref <0.03)

## 2018-06-21 NOTE — Progress Notes (Signed)
This RN attempted report x2 [facility] [place].   [facility] has been called to transport back to facility.   Will continue to attempt report to receiving facility.

## 2018-06-21 NOTE — Discharge Summary (Addendum)
Discharge Summary    Patient ID: Nancy Simmons MRN: [id number]; DOB: 05-02-1956  Admit date: 06/20/2018 Discharge date: 06/21/2018  Primary Care Provider: Wilmer Floor., MD  Primary Cardiologist: [place] Primary Electrophysiologist:  None   Discharge Diagnoses    Principal Problem:   Chest pain, unspecified Active Problems:   HTN (hypertension)   Hypercholesterolemia   DM2 (diabetes mellitus, type 2) (HCC)   CAD in native artery   Morbid obesity (HCC)   History of DVT (deep vein thrombosis)   Chest pain   Allergies Allergies  Allergen Reactions  . Acetaminophen Other (See Comments)    PATIENT HAS AUTOIMMUNE HEPATITIS PATIENT IS * NOT * TO RECEIVE ANY ACETAMINOPHEN  . Penicillin G Rash and Other (See Comments)    Has patient had a PCN reaction causing immediate rash, facial/tongue/throat swelling, SOB or lightheadedness with hypotension: No Has patient had a PCN reaction causing severe rash involving mucus membranes or skin necrosis: No Has patient had a PCN reaction that required hospitalization: No Has patient had a PCN reaction occurring within the last 10 years: #  #  #  YES  #  #  #     Diagnostic Studies/Procedures    N/A _____________   History of Present Illness     Nancy Simmons is a 62 y.o. 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  She recalls having similar symptoms about 5 years ago when she was diagnosed with a bout of autoimmune hepatitis. She was previously admitted to [facility] in 03/2017 with a sharper type of chest pain. A cardiac stress test was done which was positive for reversible ischemia of the  anterior septal and inferior lateral wall. She subsequently underwent cath with 30% LM, 30% ostial LAD, 45% mLAD, 50% dLAD, 30% prox RCA, no flow limiting lesions, LVEF 55-65%. She recently underwent planned L2-3 discectomy for lumbar herniated disc 05/22/18, post-op course appeared unremarkable. She was discharged to [facility] rehab and had actually progressed with therapy well enough in the last few weeks that she was slated to be discharged from their program soon. She had not had any recent exertional anginal sx. One of her grandchildren was born yesterday. Today while at rest around 10:30am she developed sudden substernal chest discomfort described as a dull ache associated with nausea. It migrated to epigastric region and left shoulder blade. She went and laid down and felt somewhat better. However, due to persistent sx she alerted facility staff. She was given 2 SL NTG a few minutes apart, each prompting some relief. When she got to the ER she was given an additional SL NTG which afforded more relief. She has since had a NTG patch placed and remains pain free. She denies any pleuritic component to her pain. No palpitations, diaphoresis, vomiting, or any other recent changes in symptoms otherwise. She continues to smoke but has been using Wellbutrin and has cut down significantly. ER workup notable for negative troponin x 1, Hgb 10.3 c/w prior, glucose 136 but otherwise normal CMET and lipase. VSS - not tachycardic, tachypneic or hypoxic.   Hospital Course     Consultants: N/A   Given the atypical symptoms, she was kept overnight for observation.  Serial  troponin was negative.  Her chest pain resolved and has not recurred again.  EKG was normal as well.  Patient was taken off of nitroglycerin patch on 06/21/2018 and was able to ambulate without any recurrent exertional chest discomfort.  She is deemed stable for discharge from cardiology perspective.  I will arrange outpatient follow-up in our  [place] office.  Note, patient has been on systemic anticoagulation therapy for the PICC induced DVT since at least February 2017.  Current guidelines suggest minimal of 77-month of systemic anticoagulation.  It is unclear to [place] why the patient has been on [facility] for more than 2 years.  Since we do not have the work-up involved from [facility], we will defer this decision to her primary care provider.  _____________  Discharge Vitals Blood pressure (!) 144/78, pulse 66, temperature 98.3 F (36.8 C), temperature source Oral, resp. rate 17, height 5\' 2"  (1.575 m), weight 123.8 kg, SpO2 98 %.  Filed Weights   06/20/18 1053  Weight: 123.8 kg    Labs & Radiologic Studies    CBC Recent Labs    06/20/18 1055 06/21/18 0403  WBC 5.1 5.6  HGB 10.3* 9.8*  HCT 34.6* 32.1*  MCV 96.4 94.4  PLT 238 243   Basic Metabolic Panel Recent Labs    16/10/96 1055 06/21/18 0403  NA 139 140  K 4.4 4.1  CL 108 106  CO2 23 24  GLUCOSE 136* 90  BUN 19 17  CREATININE 0.85 0.89  CALCIUM 9.6 9.4   Liver Function Tests Recent Labs    06/20/18 1055 06/21/18 0403  AST 23 21  ALT 19 17  ALKPHOS 103 95  BILITOT 0.5 0.6  PROT 7.4 6.9  ALBUMIN 3.5 3.2*   Recent Labs    06/20/18 1055  LIPASE 36   Cardiac Enzymes Recent Labs    06/20/18 1630 06/20/18 2146 06/21/18 0403  TROPONINI <0.03 <0.03 <0.03   BNP Invalid input(s): POCBNP D-Dimer No results for input(s): DDIMER in the last 72 hours. Hemoglobin A1C No results for input(s): HGBA1C in the last 72 hours. Fasting Lipid Panel Recent Labs    06/21/18 0403  CHOL 135  HDL 30*  LDLCALC 82  TRIG 045  CHOLHDL 4.5   Thyroid Function Tests No results for input(s): TSH, T4TOTAL, T3FREE, THYROIDAB in the last 72 hours.  Invalid input(s): FREET3 _____________  Dg Chest 2 View  Result Date: 06/20/2018 CLINICAL DATA:  Acute onset chest pain that began approximately 9:30 a.m. this morning. EXAM: CHEST - 2 VIEW COMPARISON:   04/02/2017, 04/01/2017, 10/04/2016 and earlier. FINDINGS: Cardiac silhouette mildly enlarged, unchanged. Hilar and mediastinal contours otherwise unremarkable. Lungs clear. Mild pulmonary venous hypertension without evidence of overt pulmonary edema. No pleural effusions. Degenerative changes and DISH involving the thoracic spine. IMPRESSION: Stable cardiomegaly.  No acute cardiopulmonary disease. Electronically Signed   By: Hulan Saas M.D.   On: 06/20/2018 11:18   Dg Lumbar Spine 1 View  Result Date: 05/22/2018 CLINICAL DATA:  L2-3 micro discectomy. EXAM: LUMBAR SPINE - 1 VIEW COMPARISON:  Radiographs of April 04, 2018. MRI of April 11, 2018. FINDINGS: Single intraoperative cross-table lateral projection of the lumbar spine demonstrates a surgical probe directed toward the posterior elements of L2. IMPRESSION: Surgical localization as described above. Electronically Signed   By: Lupita Raider, M.D.   On: 05/22/2018 14:40   Xr Hip Unilat W Or W/o Pelvis 1v Left  Result Date: 06/04/2018 Standing AP pelvis and lateral of the left  hip shows a well-seated total hip arthroplasty with no complicating features.  There is been a change compared to previous films there were standing films from 2 months ago.  Disposition   Pt is being discharged home today in good condition.  Follow-up Plans & Appointments    Follow-up Information    Wilmer Floor., MD. Schedule an appointment as soon as possible for a visit.   Specialty:  Internal Medicine Contact information: [address] [place] [address] [phone]        [facility] Follow up.   Specialty:  Cardiology Why:  our cardiology office scheduler will contact you to arrange a followup, please give [place] a call if do not hear from [place] in 3 business days.  Contact information: [address] [place] [address] [phone]           Discharge Medications    Allergies as of 06/21/2018      Reactions   Acetaminophen Other (See Comments)   PATIENT HAS AUTOIMMUNE HEPATITIS PATIENT IS * NOT * TO RECEIVE ANY ACETAMINOPHEN   Penicillin G Rash, Other (See Comments)   Has patient had a PCN reaction causing immediate rash, facial/tongue/throat swelling, SOB or lightheadedness with hypotension: No Has patient had a PCN reaction causing severe rash involving mucus membranes or skin necrosis: No Has patient had a PCN reaction that required hospitalization: No Has patient had a PCN reaction occurring within the last 10 years: #  #  #  YES  #  #  #      Medication List    TAKE these medications   albuterol (2.5 MG/3ML) 0.083% nebulizer solution Commonly known as:  PROVENTIL Take 2.5 mg by nebulization every 6 (six) hours as needed for wheezing or shortness of breath.   amLODipine 5 MG tablet Commonly known as:  NORVASC Take 5 mg by mouth daily.   aspirin EC 81 MG tablet Take 81 mg by mouth daily.   azaTHIOprine 50 MG tablet Commonly known as:  IMURAN Take 50 mg by mouth daily.   beclomethasone 80 MCG/ACT inhaler Commonly known as:  QVAR Inhale 2 puffs into the lungs 2 (two) times daily.   buPROPion 150 MG 24 hr tablet Commonly known as:  WELLBUTRIN XL Take 150 mg by mouth daily.   COMBIVENT RESPIMAT 20-100 MCG/ACT Aers respimat Generic drug:  Ipratropium-Albuterol Inhale 1 puff into the lungs every 6 (six) hours as needed for wheezing or shortness of breath.   diclofenac sodium 1 % Gel Commonly known as:  VOLTAREN Apply 1 application topically 4 (four) times daily as needed (for pain).   fesoterodine 8 MG Tb24 tablet Commonly known as:  TOVIAZ Take 8 mg by mouth daily.   gabapentin 300 MG capsule Commonly known as:  NEURONTIN Take 300 mg by mouth every 8 (eight) hours as needed (muscle spasms).   lisinopril 20 MG tablet Commonly known as:  PRINIVIL,ZESTRIL Take 20 mg by mouth daily.   Meclizine HCl 25 MG Chew Chew 25 mg  by mouth 3 (three) times daily as needed for dizziness.   meloxicam 7.5 MG tablet Commonly known as:  MOBIC Take 15 mg by mouth daily.   metFORMIN 500 MG tablet Commonly known as:  GLUCOPHAGE Take 500 mg by mouth 2 (two) times daily with a meal.   metoprolol tartrate 50 MG tablet Commonly known as:  LOPRESSOR Take 50 mg by mouth 2 (two) times daily.   MULTIVITAMIN GUMMIES WOMENS  PO Take 2 each by mouth daily.   omeprazole 20 MG capsule Commonly known as:  PRILOSEC Take 40 mg by mouth every morning.   Oxycodone HCl 10 MG Tabs Take 1 tablet (10 mg total) by mouth every 4 (four) hours as needed ((score 7 to 10)).   rivaroxaban 20 MG Tabs tablet Commonly known as:  XARELTO Take 20 mg by mouth daily with supper.   tiotropium 18 MCG inhalation capsule Commonly known as:  SPIRIVA Place 18 mcg into inhaler and inhale daily.   tiZANidine 2 MG tablet Commonly known as:  ZANAFLEX Take 1 tablet (2 mg total) by mouth every 8 (eight) hours as needed for muscle spasms.   TRILIPIX 135 MG capsule Generic drug:  Choline Fenofibrate Take 135 mg by mouth every evening.        Acute coronary syndrome (MI, NSTEMI, STEMI, etc) this admission?: No.    Outstanding Labs/Studies   N/A  Duration of Discharge Encounter   Greater than 30 minutes including physician time.  Ramond DialSigned, Hao Meng, PA 06/21/2018, 12:24 PM   Personally seen and examined. Agree with above.  Currently feeling well, no chest pain.  She has had some back pain with radiation down her right leg.  She took a pain pill for this, she states.  GEN: Well nourished, well developed, in no acute distress, obese HEENT: normal  Neck: no JVD, carotid bruits, or masses Cardiac: RRR; no murmurs, rubs, or gallops,no edema  Respiratory:  clear to auscultation bilaterally, normal work of breathing GI: soft, nontender, nondistended, + BS MS: no deformity or atrophy  Skin: warm and dry, no rash Neuro:  Alert and Oriented x  3, Strength and sensation are intact Psych: euthymic mood, full affect  Assessment and plan:  Chest discomfort - Currently reassuring.  Prior minimal CAD noted on heart catheterization in September 2018.  Continue with aggressive secondary risk factor prevention however she is not taking a statin due to autoimmune hepatitis possibly in the past.  LDL 82.  Continue to encourage weight loss.  No further cardiac testing.  Okay to discharge.  Troponins have been normal.  Sinus rhythm with no ischemic changes personally reviewed and interpreted ECG  Diabetes with hypertension obstructive sleep apnea and hyperlipidemia - Continue with risk factor modification.  CPAP.  History of PICC line induced DVT -Discussed with her.  She does not have a history of thrombotic events other than this.  PICC line was utilized for antibiotics, knee revision. - I suggested to her that she may discontinue her Eliquis.  She said she will discuss this further with Dr. Orvan Falconerampbell in follow-up.  Morbid obesity -Continue to encourage weight loss-BMI 49  Chest pain has not returned.  Serial troponins were negative.  Okay for discharge.  Donato SchultzMark Skains, MD

## 2018-06-21 NOTE — Progress Notes (Addendum)
Progress Note  Patient Name: Nancy Simmons Date of Encounter: 06/21/2018  Primary Cardiologist: Rosalita Levan  Subjective   Denies any further chest pain, occasional epigastric pain. No SOB. Has been ambulating since yesterday without any issue  Inpatient Medications    Scheduled Meds: . amLODipine  5 mg Oral Daily  . aspirin EC  81 mg Oral Daily  . azaTHIOprine  50 mg Oral Daily  . budesonide  0.5 mg Nebulization BID  . buPROPion  150 mg Oral Daily  . fenofibrate  160 mg Oral QPM  . fesoterodine  8 mg Oral Daily  . insulin aspart  0-9 Units Subcutaneous TID WC  . lisinopril  20 mg Oral Daily  . metoprolol tartrate  50 mg Oral BID  . multivitamin with minerals  1 tablet Oral Daily  . pantoprazole  80 mg Oral Daily  . rivaroxaban  20 mg Oral Q supper  . sodium chloride flush  3 mL Intravenous Q12H  . umeclidinium bromide  1 puff Inhalation Daily   Continuous Infusions: . sodium chloride     PRN Meds: sodium chloride, albuterol, gabapentin, meclizine, nitroGLYCERIN, ondansetron (ZOFRAN) IV, oxyCODONE, sodium chloride flush, tiZANidine   Vital Signs    Vitals:   06/20/18 2205 06/21/18 0108 06/21/18 0642 06/21/18 0803  BP:  110/63 125/72   Pulse: (!) 59 (!) 59 66   Resp:  19 17   Temp:  98.4 F (36.9 C) 98.3 F (36.8 C)   TempSrc:  Oral Oral   SpO2:  97% 97% 98%  Weight:      Height:        Intake/Output Summary (Last 24 hours) at 06/21/2018 0858 Last data filed at 06/21/2018 0800 Gross per 24 hour  Intake 393 ml  Output 1 ml  Net 392 ml   Filed Weights   06/20/18 1053  Weight: 123.8 kg    Telemetry    NSR without significant ventricular ectopy - Personally Reviewed  ECG    NSR with poor R wave progression in anterior leads - Personally Reviewed  Physical Exam   GEN: No acute distress.   Neck: No JVD Cardiac: RRR, no murmurs, rubs, or gallops.  Respiratory: Clear to auscultation bilaterally. GI: Soft, nontender, non-distended  MS: No edema; No  deformity. Neuro:  Nonfocal  Psych: Normal affect   Labs    Chemistry Recent Labs  Lab 06/20/18 1055 06/21/18 0403  NA 139 140  K 4.4 4.1  CL 108 106  CO2 23 24  GLUCOSE 136* 90  BUN 19 17  CREATININE 0.85 0.89  CALCIUM 9.6 9.4  PROT 7.4 6.9  ALBUMIN 3.5 3.2*  AST 23 21  ALT 19 17  ALKPHOS 103 95  BILITOT 0.5 0.6  GFRNONAA >60 >60  GFRAA >60 >60  ANIONGAP 8 10     Hematology Recent Labs  Lab 06/20/18 1055 06/21/18 0403  WBC 5.1 5.6  RBC 3.59* 3.40*  HGB 10.3* 9.8*  HCT 34.6* 32.1*  MCV 96.4 94.4  MCH 28.7 28.8  MCHC 29.8* 30.5  RDW 15.7* 15.8*  PLT 238 243    Cardiac Enzymes Recent Labs  Lab 06/20/18 1630 06/20/18 2146 06/21/18 0403  TROPONINI <0.03 <0.03 <0.03    Recent Labs  Lab 06/20/18 1110  TROPIPOC 0.01     BNPNo results for input(s): BNP, PROBNP in the last 168 hours.   DDimer No results for input(s): DDIMER in the last 168 hours.   Radiology    Dg Chest 2  View  Result Date: 06/20/2018 CLINICAL DATA:  Acute onset chest pain that began approximately 9:30 a.m. this morning. EXAM: CHEST - 2 VIEW COMPARISON:  04/02/2017, 04/01/2017, 10/04/2016 and earlier. FINDINGS: Cardiac silhouette mildly enlarged, unchanged. Hilar and mediastinal contours otherwise unremarkable. Lungs clear. Mild pulmonary venous hypertension without evidence of overt pulmonary edema. No pleural effusions. Degenerative changes and DISH involving the thoracic spine. IMPRESSION: Stable cardiomegaly.  No acute cardiopulmonary disease. Electronically Signed   By: Hulan Saashomas  Lawrence M.D.   On: 06/20/2018 11:18    Cardiac Studies   Cath 04/05/2017  The left ventricular systolic function is normal. The left ventricular ejection fraction is 55-65% by visual estimate.  LV end diastolic pressure is mildly elevated.  ___________________________________  LM lesion, 30 %stenosed.  Ost LAD lesion, 30 %stenosed. Mid LAD lesion, 45 %stenosed. Dist LAD lesion, 50 %stenosed. -  Combined FFR with these lesions was 0.87. Not physiologically significant.  Prox RCA lesion, 30 %stenosed and the remainder of the mid vessel is diffusely diseased but mild. No significant flow-limiting lesions   No flow-limiting lesions takes when the patient several stress test. The 2 lesions in the LAD are angiographically not significant and not significant by FFR.  Either of these 2 lesions could potentially be a focal site for coronary spasm however.  Her stress test would be considered false positive.  Recommendation: Considered other noncardiac etiology for chest pain versus potential coronary spasm.  Patient Profile     62 y.o. 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 Chest pain  - occurred at rest. Symptom atypical. No further chest pain since arrival. Still has mild epigastric discomfort not affected by activity.  - will remove nitro patch, ambulate with walker, if no exertional symptom, expect discharge.   2. CAD: mild disease noted during cath 03/2017. Despite not on statin (?due to autoimmune hepatitis?), FLP not bad, LDL 82, normal total cholesterol, trig and HDL  3. DM II  4. OSA on CPAP  5. HLD  6. Autoimmune hepatitis  7. H/o PICC induced DVT: on anticoagulation chronically per PCP?  - this will need to be readdressed as outpatient. The [facility] that [facility], ED note on 09/07/2014 mentioned [facility] had [facility] line [facility] for antibiotics and to draw blood." Diagnosis code suggest thrombosis of artery. She was seen for anemia at the  time. ACC guideline suggest treat PICC-DVT for minimal of 3 month. Patient has been on eliquis for more than 2 years. [facility] not available to me so I don't know if there is any diagnosis of hypercoagualable disorder, but duration of blood thinner need to be reassessed by PCP   For questions or updates, please contact [facility] Please consult [email or web address] for contact info under        Signed, Azalee CourseHao Meng, PA  06/21/2018, 8:58 AM    Personally seen and examined. Agree with above.  Currently feeling well, no chest pain.  She has had some back pain with radiation down her right leg.  She took a pain pill for this, she states.  GEN: Well nourished, well developed, in no acute distress, obese  HEENT: normal  Neck: no JVD, carotid bruits, or masses Cardiac: RRR; no murmurs, rubs, or gallops,no edema  Respiratory:  clear to auscultation bilaterally, normal work of breathing GI: soft, nontender, nondistended, + BS MS: no deformity or atrophy  Skin: warm and dry, no rash Neuro:  Alert and Oriented x 3, Strength and sensation are intact Psych: euthymic mood, full affect  Assessment and plan:  Chest discomfort - Currently reassuring.  Prior minimal CAD noted on heart catheterization in September 2018.  Continue with aggressive secondary risk factor prevention however she is not taking a statin due to autoimmune hepatitis possibly in the past.  LDL 82.  Continue to encourage weight loss.  No further cardiac testing.  Okay to discharge.  Troponins have been normal.  Sinus rhythm with no ischemic changes personally reviewed and interpreted ECG  Diabetes with hypertension obstructive sleep apnea and hyperlipidemia - Continue with risk factor modification.  CPAP.  History of PICC line induced DVT -Discussed with her.  She does not have a history of thrombotic events other than this.  PICC line was utilized for antibiotics, knee revision. - I suggested to her that she may  discontinue her Eliquis.  She said she will discuss this further with Dr. Orvan Falconer in follow-up.  Morbid obesity -Continue to encourage weight loss-BMI 49  Donato Schultz, MD

## 2018-06-21 NOTE — Progress Notes (Signed)
This RN removed Nitro paste and ambulated pt in room and to bathroom.   Pt tolerated well with no c/o chest pain.

## 2018-06-21 NOTE — Social Work (Signed)
Acknowledging consult that pt "may be discharged home". For home needs please consult RN Case Manager.  CSW signing off. Please consult if any additional needs arise.  Nancy Simmons, LCSWA [facility] Clinical Social Work [phone]

## 2018-06-21 NOTE — Discharge Instructions (Signed)
Information on my medicine - XARELTO (rivaroxaban)  This medication education was reviewed with me or my healthcare representative as part of my discharge preparation.  The pharmacist that spoke with me during my hospital stay was:  Ulyses SouthwardMinh Lattie Cervi, RPH-CPP  WHY WAS Nancy HurlXARELTO PRESCRIBED FOR YOU? Xarelto was prescribed to treat blood clots that may have been found in the veins of your legs (deep vein thrombosis) or in your lungs (pulmonary embolism) and to reduce the risk of them occurring again.  What do you need to know about Xarelto? Continue Xarelto 20mg  daily  DO NOT stop taking Xarelto without talking to the health care provider who prescribed the medication.  Refill your prescription for 20 mg tablets before you run out.  After discharge, you should have regular check-up appointments with your healthcare provider that is prescribing your Xarelto.  In the future your dose may need to be changed if your kidney function changes by a significant amount.  What do you do if you miss a dose? If you are taking Xarelto TWICE DAILY and you miss a dose, take it as soon as you remember. You may take two 15 mg tablets (total 30 mg) at the same time then resume your regularly scheduled 15 mg twice daily the next day.  If you are taking Xarelto ONCE DAILY and you miss a dose, take it as soon as you remember on the same day then continue your regularly scheduled once daily regimen the next day. Do not take two doses of Xarelto at the same time.   Important Safety Information Xarelto is a blood thinner medicine that can cause bleeding. You should call your healthcare provider right away if you experience any of the following: ? Bleeding from an injury or your nose that does not stop. ? Unusual colored urine (red or dark brown) or unusual colored stools (red or black). ? Unusual bruising for unknown reasons. ? A serious fall or if you hit your head (even if there is no bleeding).  Some medicines may  interact with Xarelto and might increase your risk of bleeding while on Xarelto. To help avoid this, consult your healthcare provider or pharmacist prior to using any new prescription or non-prescription medications, including herbals, vitamins, non-steroidal anti-inflammatory drugs (NSAIDs) and supplements.  This website has more information on Xarelto: [email or web address].

## 2018-08-13 ENCOUNTER — Encounter (INDEPENDENT_AMBULATORY_CARE_PROVIDER_SITE_OTHER): Payer: Self-pay | Admitting: Orthopaedic Surgery

## 2018-08-13 ENCOUNTER — Ambulatory Visit (INDEPENDENT_AMBULATORY_CARE_PROVIDER_SITE_OTHER): Payer: Medicaid Other

## 2018-08-13 ENCOUNTER — Ambulatory Visit (INDEPENDENT_AMBULATORY_CARE_PROVIDER_SITE_OTHER): Payer: Medicaid Other | Admitting: Orthopaedic Surgery

## 2018-08-13 DIAGNOSIS — Z96642 Presence of left artificial hip joint: Secondary | ICD-10-CM

## 2018-08-13 MED ORDER — OXYCODONE HCL 10 MG PO TABS: 10.0000 mg | ORAL_TABLET | ORAL | 0 refills | Status: DC | PRN

## 2018-08-13 NOTE — Progress Notes (Signed)
The patient is now 8 months status post a left total hip arthroplasty.  She is someone who is morbidly obese but her hip disease was so bad I felt comfortable doing her surgery and she is done well with this.  She is someone who does have chronic pain and has had previous back surgery.  She says her right hip is bothering her some now but is not in the groin.  On exam her left hip and right hip with both move fluidly and full without any issues at all.  She has a little bit of pain to palpation over the trochanteric area on the right side.  A low AP pelvis and lateral left hip shows a well-seated implant with no complicating features at all.  Her right hip does not appear arthritic.  She is someone that is working on getting back in chronic pain management.  She does wish to have some oxycodone today and I said I can only give her 5 days worth.  She understands this.  We will see her back at her one-year follow-up in 4 months from now.  No x-rays are needed at that visit but we can consider a trochanteric injection on the right side if needed.

## 2018-08-14 ENCOUNTER — Telehealth (INDEPENDENT_AMBULATORY_CARE_PROVIDER_SITE_OTHER): Payer: Self-pay | Admitting: Orthopaedic Surgery

## 2018-08-14 NOTE — Telephone Encounter (Signed)
Patient called to fu on the status of the PA.  Thank you

## 2018-08-14 NOTE — Telephone Encounter (Signed)
Mailed medical record release form per patient request

## 2018-08-14 NOTE — Telephone Encounter (Signed)
Patient called stating that her pharmacy advised her that the Oxycodone needs a prior authorization before they can fill her RX.  CB#[phone].  Thank you.

## 2018-08-15 NOTE — Telephone Encounter (Signed)
So the oxycodone 10 was denied, do you want to try and send 5mg ?

## 2018-08-15 NOTE — Telephone Encounter (Signed)
CORRECTION-- patient just paid full price for the 10mg  and picked it up

## 2018-08-19 ENCOUNTER — Telehealth (INDEPENDENT_AMBULATORY_CARE_PROVIDER_SITE_OTHER): Payer: Self-pay | Admitting: Orthopaedic Surgery

## 2018-08-19 NOTE — Telephone Encounter (Signed)
Returned call to patient      Mailed medical records release form again per pt request  Confirmed address

## 2018-08-25 ENCOUNTER — Telehealth (INDEPENDENT_AMBULATORY_CARE_PROVIDER_SITE_OTHER): Payer: Self-pay | Admitting: Orthopaedic Surgery

## 2018-08-25 NOTE — Telephone Encounter (Signed)
Patient called to state that she needed a refill on Oxycodone.  Please call patient to advise.  [phone]

## 2018-08-25 NOTE — Telephone Encounter (Signed)
Please advise 

## 2018-08-25 NOTE — Telephone Encounter (Signed)
I told her at her last visit that this would be the only time I could provide that medication for her since she is so far out from her surgery and the fact that her hip postoperative looks fine.

## 2018-08-26 NOTE — Telephone Encounter (Signed)
Patient aware of the below message  

## 2018-12-03 ENCOUNTER — Encounter: Payer: Self-pay | Admitting: Orthopaedic Surgery

## 2018-12-03 ENCOUNTER — Ambulatory Visit (INDEPENDENT_AMBULATORY_CARE_PROVIDER_SITE_OTHER): Payer: Medicaid Other | Admitting: Orthopaedic Surgery

## 2018-12-03 ENCOUNTER — Other Ambulatory Visit: Payer: Self-pay

## 2018-12-03 DIAGNOSIS — Z96642 Presence of left artificial hip joint: Secondary | ICD-10-CM | POA: Diagnosis not present

## 2018-12-03 NOTE — Progress Notes (Signed)
HPI: Mrs. Gilkey comes in today now 1 year status post left total hip arthroplasty.  She states she is doing well in regards to her left hip.  She states her range of motion strength improving.  Still does ambulate with a rolling walker.  She sees pain management due to low back pain.  She is no longer having the right hip pain she had due to her bursitis.  She has no complaints.  She had a recent urinary tract infection which was treated with antibiotics.  She states that her incision is healing well.  She is had no fevers chills.  Physical exam: Left hip excellent range of motion without pain.  Minimal tenderness over the trochanteric region.  Impression: Status post left total hip arthroplasty 12/10/2017  Plan: Discussed with her IT band stretching exercises.  She will see [place] back on an as-needed basis or if she has any questions or concerns.  Questions were encouraged and answered by Dr. Magnus Ivan and myself.

## 2018-12-11 ENCOUNTER — Ambulatory Visit: Payer: Self-pay | Admitting: Orthopaedic Surgery

## 2019-03-28 IMAGING — DX DG PORTABLE PELVIS
1 series · 1 of 1 positions shown · non-contrast
Comparison: November 04, 2017 and intraoperative study December 10, 2017

CLINICAL DATA: Status post total hip replacement

EXAM:
PORTABLE PELVIS 1-2 VIEWS

[pelvis ap]
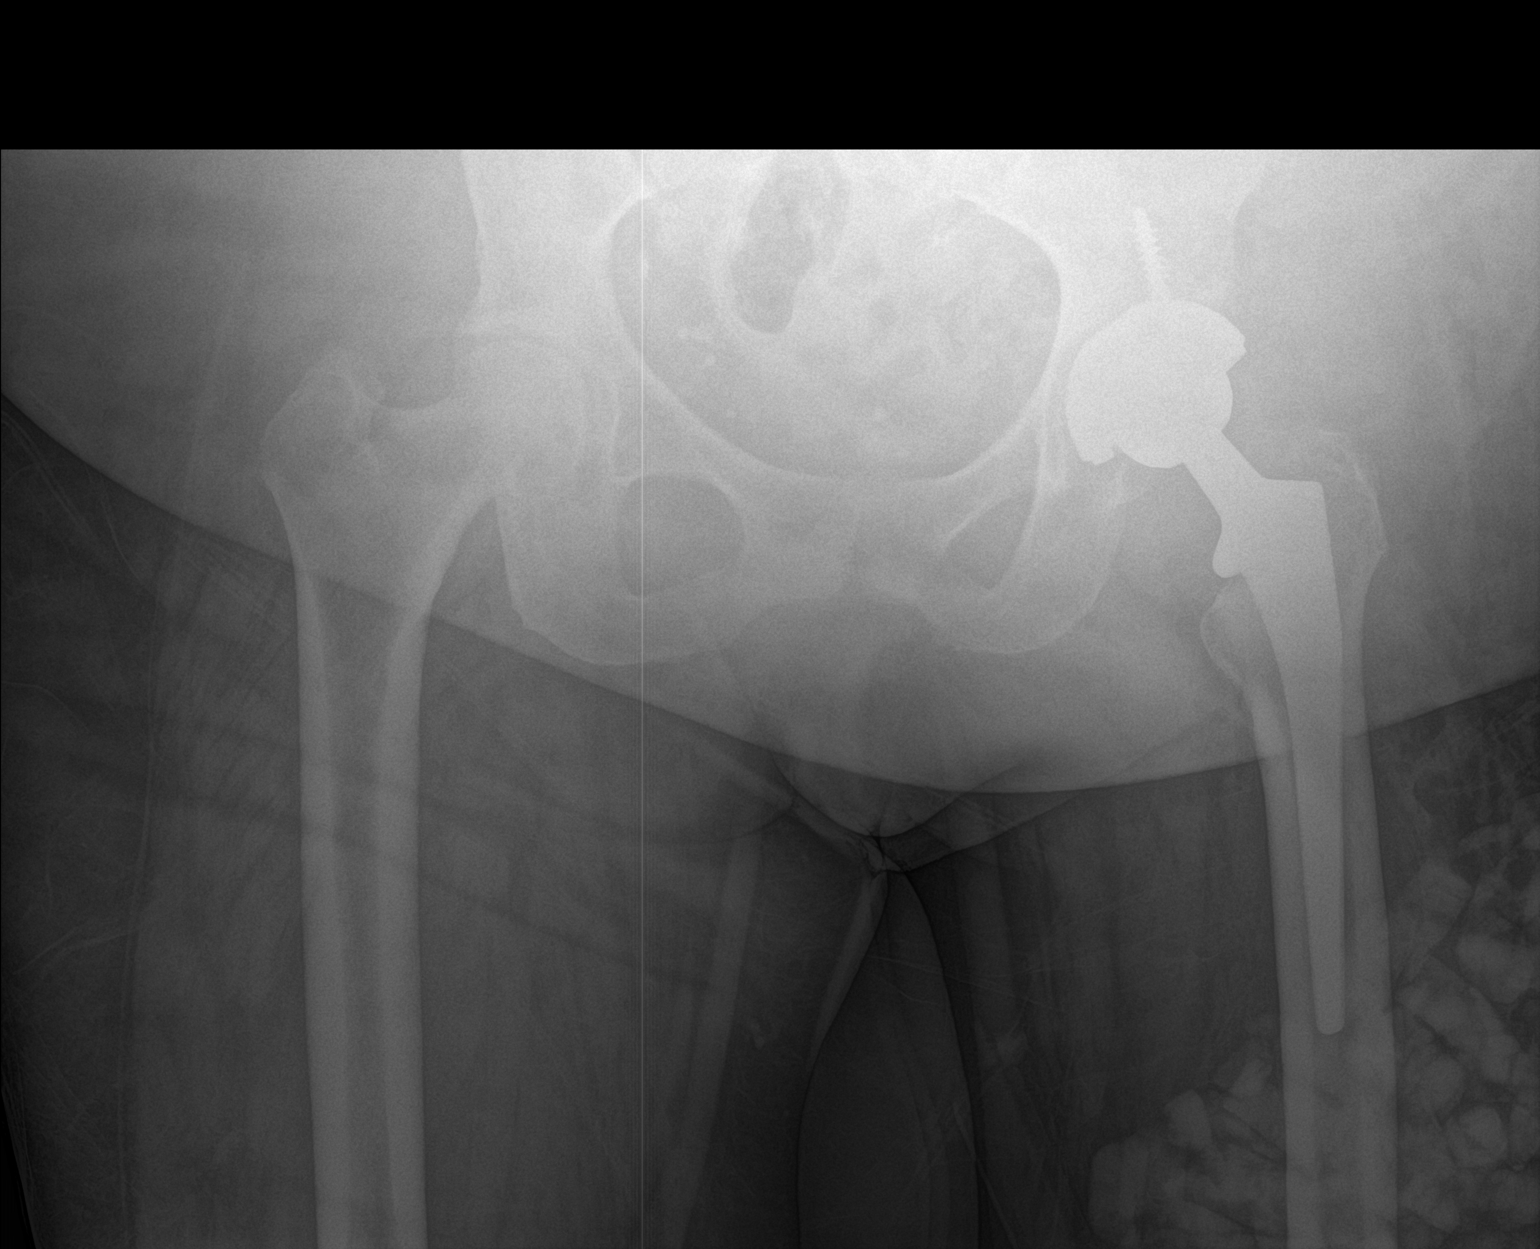

[1 of 1 positions shown; findings below may reference images not displayed]

FINDINGS: Frontal image of lower pelvis and both hips obtained. There is a
total hip replacement on the left with prosthetic components
well-seated on frontal view. No acute fracture or dislocation. There
is moderate narrowing of the right hip joint.
IMPRESSION: Total hip replacement on the left with prosthetic components
appearing well-seated on frontal view. No fracture or dislocation.
Moderate narrowing right hip joint.

## 2019-03-28 IMAGING — RF DG HIP (WITH PELVIS) OPERATIVE*L*
1 series · 2 of 2 positions shown · non-contrast
Comparison: None.

CLINICAL DATA: Left total hip arthroplasty from anterior approach.

EXAM:
DG C-ARM 61-120 MIN; OPERATIVE LEFT HIP WITH PELVIS

[Series 1: run · 2 of 2 slices shown]
[im 1/2]
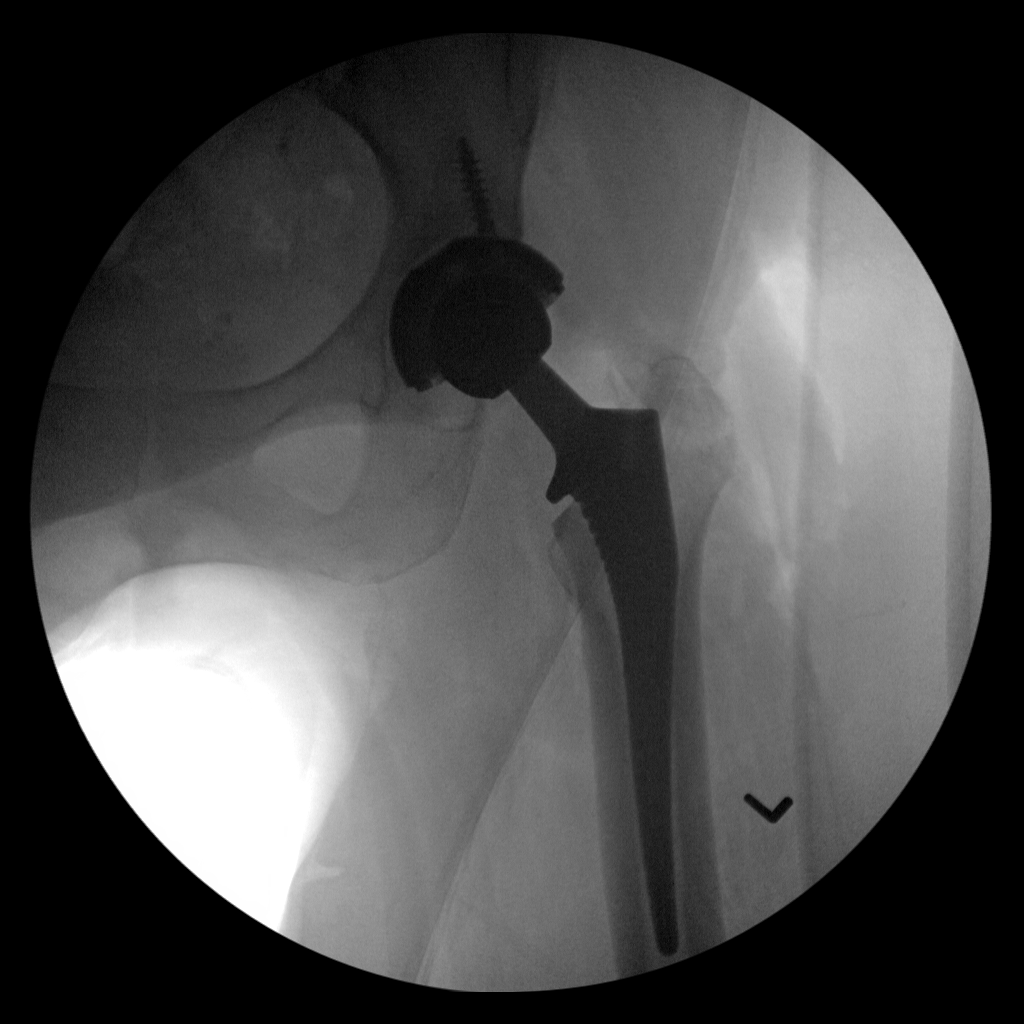
[im 2/2]
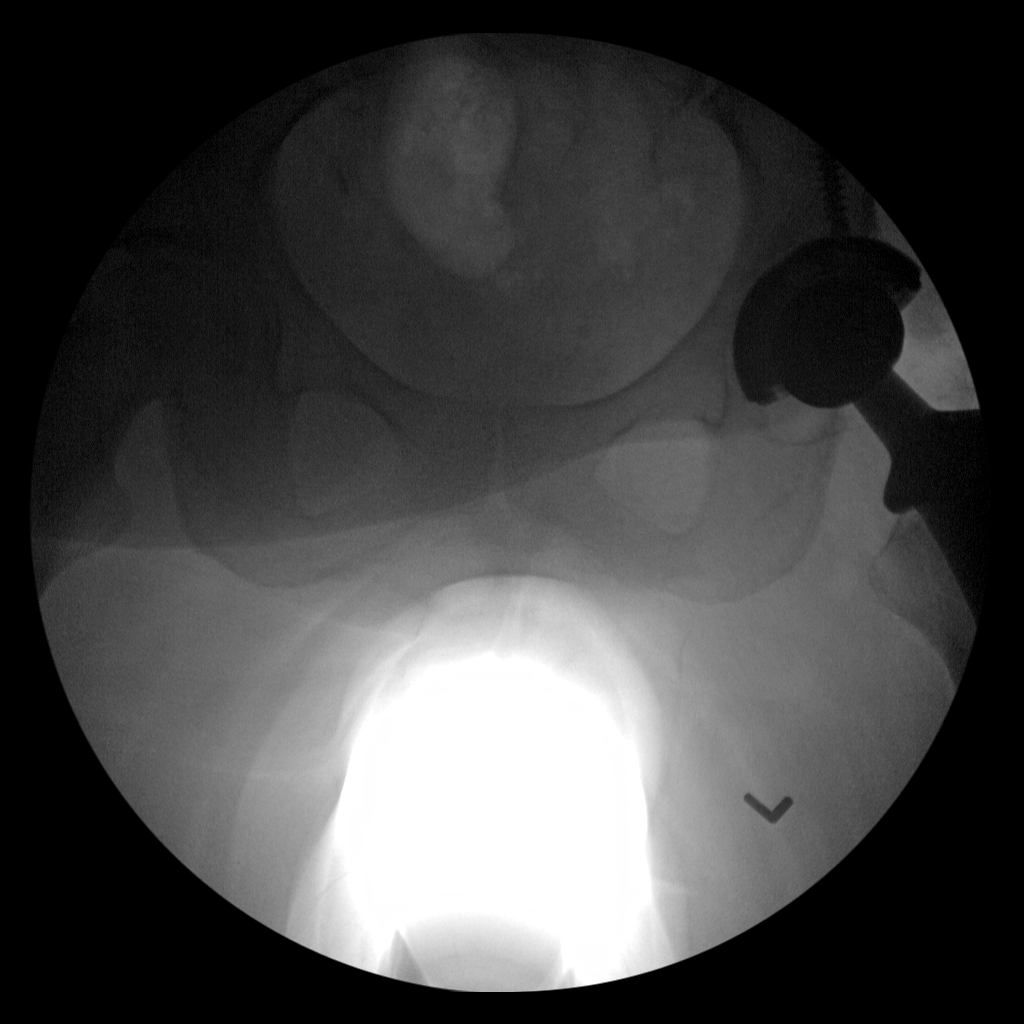

[2 of 2 positions shown; findings below may reference images not displayed]

FINDINGS: A total of 32 seconds of fluoroscopic time was utilized for
placement of an uncemented left total hip arthroplasty. Fine bony
detail is limited by the C-arm fluoroscopic technique. Two views are
provided demonstrating no immediate intraoperative complications nor
evidence of hardware failure.
IMPRESSION: Fluoroscopic time utilized for placement of left total hip
arthroplasty. No immediate complications identified.

## 2019-08-20 ENCOUNTER — Other Ambulatory Visit: Payer: Self-pay | Admitting: Neurosurgery

## 2019-08-28 ENCOUNTER — Other Ambulatory Visit: Payer: Self-pay | Admitting: Neurosurgery

## 2019-08-28 NOTE — Pre-Procedure Instructions (Signed)
[address] [place], [place] - [address] [place] [address] Phone: [phone] Fax: [phone]      Your procedure is scheduled on Thursday February 11th.  Report to [facility] Main Entrance "A" at 9:30 A.M., and check in at the Admitting office.  Call this number if you have problems the morning of surgery:  [phone]  Call [phone] if you have any questions prior to your surgery date Monday-Friday 8am-4pm    Remember:  Do not eat or drink after midnight the night before your surgery     Take these medicines the morning of surgery with A SIP OF WATER  amLODipine (NORVASC)  buPROPion (WELLBUTRIN XL)  fesoterodine (TOVIAZ) gabapentin (NEURONTIN) hydrALAZINE (APRESOLINE) pantoprazole (PROTONIX)  Tiotropium Bromide-Olodaterol (STIOLTO RESPIMAT IN) albuterol (VENTOLIN HFA) if needed *bring with you to the hospital* meclizine (ANTIVERT) if needed Oxycodone HCl if needed tiZANidine (ZANAFLEX) if needed  Follow your surgeon's instructions on when to stop Asprin and apixaban (ELIQUIS) .  If no instructions were given by your surgeon then you will need to call the office to get those instructions.     7 days prior to surgery STOP taking any Aspirin (unless otherwise instructed by your surgeon), Aleve, Naproxen, Ibuprofen, Motrin, Advil, Goody's, BC's, all herbal medications, fish oil, and all vitamins.   HOW TO MANAGE YOUR DIABETES BEFORE AND AFTER SURGERY  Why is it important to control my blood sugar before and after surgery? . Improving blood sugar levels before and after surgery helps healing and can limit problems. . A way of improving blood sugar control is eating a healthy diet by: o  Eating less sugar and carbohydrates o  Increasing activity/exercise o  Talking with your doctor about reaching your blood sugar goals . High blood sugars (greater than 180 mg/dL) can raise your risk of infections and slow your recovery, so you will  need to focus on controlling your diabetes during the weeks before surgery. . Make sure that the doctor who takes care of your diabetes knows about your planned surgery including the date and location.  How do I manage my blood sugar before surgery? . Check your blood sugar at least 4 times a day, starting 2 days before surgery, to make sure that the level is not too high or low. o Check your blood sugar the morning of your surgery when you wake up and every 2 hours until you get to the Short Stay unit. . If your blood sugar is less than 70 mg/dL, you will need to treat for low blood sugar: o Do not take insulin. o Treat a low blood sugar (less than 70 mg/dL) with  cup of clear juice (cranberry or apple), 4 glucose tablets, OR glucose gel. Recheck blood sugar in 15 minutes after treatment (to make sure it is greater than 70 mg/dL). If your blood sugar is not greater than 70 mg/dL on recheck, call [phone] o  for further instructions. . Report your blood sugar to the short stay nurse when you get to Short Stay.  . If you are admitted to the hospital after surgery: o Your blood sugar will be checked by the staff and you will probably be given insulin after surgery (instead of oral diabetes medicines) to make sure you have good blood sugar levels. o The goal for blood sugar control after surgery is 80-180 mg/dL.     WHAT DO I DO ABOUT MY DIABETES MEDICATION?   Marland Kitchen Do not take oral  diabetes medicines (pills): metFORMIN (GLUCOPHAGE)   the morning of surgery.  . THE NIGHT BEFORE SURGERY, take your usual dose of liraglutide (VICTOZA)        . THE MORNING OF SURGERY, do NOT take your liraglutide (VICTOZA)   . The day of surgery, do not take other diabetes injectables, including Byetta (exenatide), Bydureon (exenatide ER), Victoza (liraglutide), or Trulicity (dulaglutide).    The Morning of Surgery  Do not wear jewelry, make-up or nail polish.  Do not wear lotions, powders, or  perfumes/colognes, or deodorant  Do not shave 48 hours prior to surgery.  Men may shave face and neck.  Do not bring valuables to the hospital.  [facility] is not responsible for any belongings or valuables.  If you are a smoker, DO NOT Smoke 24 hours prior to surgery  If you wear a CPAP at night please bring your mask the morning of surgery   Remember that you must have someone to transport you home after your surgery, and remain with you for 24 hours if you are discharged the same day.   Please bring cases for contacts, glasses, hearing aids, dentures or bridgework because it cannot be worn into surgery.    Leave your suitcase in the car.  After surgery it may be brought to your room.  For patients admitted to the hospital, discharge time will be determined by your treatment team.  Patients discharged the day of surgery will not be allowed to drive home.    Special instructions:   [place]- Preparing For Surgery  Before surgery, you can play an important role. Because skin is not sterile, your skin needs to be as free of germs as possible. You can reduce the number of germs on your skin by washing with CHG (chlorahexidine gluconate) Soap before surgery.  CHG is an antiseptic cleaner which kills germs and bonds with the skin to continue killing germs even after washing.    Oral Hygiene is also important to reduce your risk of infection.  Remember - BRUSH YOUR TEETH THE MORNING OF SURGERY WITH YOUR REGULAR TOOTHPASTE  Please do not use if you have an allergy to CHG or antibacterial soaps. If your skin becomes reddened/irritated stop using the CHG.  Do not shave (including legs and underarms) for at least 48 hours prior to first CHG shower. It is OK to shave your face.  Please follow these instructions carefully.   1. Shower the NIGHT BEFORE SURGERY and the MORNING OF SURGERY with CHG Soap.   2. If you chose to wash your hair, wash your hair first as usual with your normal  shampoo.  3. After you shampoo, rinse your hair and body thoroughly to remove the shampoo.  4. Use CHG as you would any other liquid soap. You can apply CHG directly to the skin and wash gently with a scrungie or a clean washcloth.   5. Apply the CHG Soap to your body ONLY FROM THE NECK DOWN.  Do not use on open wounds or open sores. Avoid contact with your eyes, ears, mouth and genitals (private parts). Wash Face and genitals (private parts)  with your normal soap.   6. Wash thoroughly, paying special attention to the area where your surgery will be performed.  7. Thoroughly rinse your body with warm water from the neck down.  8. DO NOT shower/wash with your normal soap after using and rinsing off the CHG Soap.  9. Pat yourself dry with a CLEAN TOWEL.  10. Wear CLEAN PAJAMAS to bed the night before surgery, wear comfortable clothes the morning of surgery  11. Place CLEAN SHEETS on your bed the night of your first shower and DO NOT SLEEP WITH PETS.    Day of Surgery:  Please shower the morning of surgery with the CHG soap Do not apply any deodorants/lotions. Please wear clean clothes to the hospital/surgery center.   Remember to brush your teeth WITH YOUR REGULAR TOOTHPASTE.   Please read over the following fact sheets that you were given.

## 2019-08-31 ENCOUNTER — Encounter (HOSPITAL_COMMUNITY)
Admission: RE | Admit: 2019-08-31 | Discharge: 2019-08-31 | Disposition: A | Discharge status: Home / Self Care | Payer: Medicaid Other | Source: Ambulatory Visit | Attending: Neurosurgery | Admitting: Neurosurgery

## 2019-08-31 ENCOUNTER — Encounter (HOSPITAL_COMMUNITY): Payer: Self-pay

## 2019-08-31 ENCOUNTER — Other Ambulatory Visit (HOSPITAL_COMMUNITY)
Admission: RE | Admit: 2019-08-31 | Discharge: 2019-08-31 | Disposition: A | Discharge status: Home / Self Care | Payer: Medicaid Other | Source: Ambulatory Visit | Attending: Neurosurgery | Admitting: Neurosurgery

## 2019-08-31 ENCOUNTER — Other Ambulatory Visit: Payer: Self-pay

## 2019-08-31 DIAGNOSIS — K754 Autoimmune hepatitis: Secondary | ICD-10-CM | POA: Insufficient documentation

## 2019-08-31 DIAGNOSIS — Z7984 Long term (current) use of oral hypoglycemic drugs: Secondary | ICD-10-CM | POA: Diagnosis not present

## 2019-08-31 DIAGNOSIS — Z90710 Acquired absence of both cervix and uterus: Secondary | ICD-10-CM | POA: Diagnosis not present

## 2019-08-31 DIAGNOSIS — Z96642 Presence of left artificial hip joint: Secondary | ICD-10-CM | POA: Diagnosis not present

## 2019-08-31 DIAGNOSIS — Z01818 Encounter for other preprocedural examination: Secondary | ICD-10-CM | POA: Insufficient documentation

## 2019-08-31 DIAGNOSIS — I10 Essential (primary) hypertension: Secondary | ICD-10-CM | POA: Diagnosis not present

## 2019-08-31 DIAGNOSIS — Z9049 Acquired absence of other specified parts of digestive tract: Secondary | ICD-10-CM | POA: Diagnosis not present

## 2019-08-31 DIAGNOSIS — M5126 Other intervertebral disc displacement, lumbar region: Secondary | ICD-10-CM | POA: Diagnosis present

## 2019-08-31 DIAGNOSIS — Z79899 Other long term (current) drug therapy: Secondary | ICD-10-CM | POA: Diagnosis not present

## 2019-08-31 DIAGNOSIS — J449 Chronic obstructive pulmonary disease, unspecified: Secondary | ICD-10-CM | POA: Insufficient documentation

## 2019-08-31 DIAGNOSIS — Z7901 Long term (current) use of anticoagulants: Secondary | ICD-10-CM | POA: Insufficient documentation

## 2019-08-31 DIAGNOSIS — Z86718 Personal history of other venous thrombosis and embolism: Secondary | ICD-10-CM | POA: Diagnosis not present

## 2019-08-31 DIAGNOSIS — K219 Gastro-esophageal reflux disease without esophagitis: Secondary | ICD-10-CM | POA: Diagnosis not present

## 2019-08-31 DIAGNOSIS — Z20822 Contact with and (suspected) exposure to covid-19: Secondary | ICD-10-CM | POA: Diagnosis not present

## 2019-08-31 DIAGNOSIS — I2510 Atherosclerotic heart disease of native coronary artery without angina pectoris: Secondary | ICD-10-CM | POA: Diagnosis not present

## 2019-08-31 DIAGNOSIS — E119 Type 2 diabetes mellitus without complications: Secondary | ICD-10-CM | POA: Insufficient documentation

## 2019-08-31 DIAGNOSIS — Z87442 Personal history of urinary calculi: Secondary | ICD-10-CM | POA: Diagnosis not present

## 2019-08-31 DIAGNOSIS — Z7982 Long term (current) use of aspirin: Secondary | ICD-10-CM | POA: Diagnosis not present

## 2019-08-31 DIAGNOSIS — M5116 Intervertebral disc disorders with radiculopathy, lumbar region: Secondary | ICD-10-CM | POA: Diagnosis not present

## 2019-08-31 DIAGNOSIS — M1990 Unspecified osteoarthritis, unspecified site: Secondary | ICD-10-CM | POA: Diagnosis not present

## 2019-08-31 DIAGNOSIS — G4733 Obstructive sleep apnea (adult) (pediatric): Secondary | ICD-10-CM | POA: Insufficient documentation

## 2019-08-31 DIAGNOSIS — Z791 Long term (current) use of non-steroidal anti-inflammatories (NSAID): Secondary | ICD-10-CM | POA: Diagnosis not present

## 2019-08-31 DIAGNOSIS — Z6841 Body Mass Index (BMI) 40.0 and over, adult: Secondary | ICD-10-CM | POA: Insufficient documentation

## 2019-08-31 DIAGNOSIS — E669 Obesity, unspecified: Secondary | ICD-10-CM | POA: Diagnosis not present

## 2019-08-31 DIAGNOSIS — F17210 Nicotine dependence, cigarettes, uncomplicated: Secondary | ICD-10-CM | POA: Diagnosis not present

## 2019-08-31 DIAGNOSIS — Z981 Arthrodesis status: Secondary | ICD-10-CM | POA: Diagnosis not present

## 2019-08-31 DIAGNOSIS — E7800 Pure hypercholesterolemia, unspecified: Secondary | ICD-10-CM | POA: Diagnosis not present

## 2019-08-31 HISTORY — DX: Chronic obstructive pulmonary disease, unspecified: J44.9

## 2019-08-31 LAB — CBC
HCT: 39.7 % (ref 36.0–46.0)
Hemoglobin: 11.9 g/dL — ABNORMAL LOW (ref 12.0–15.0)
MCH: 29.5 pg (ref 26.0–34.0)
MCHC: 30 g/dL (ref 30.0–36.0)
MCV: 98.3 fL (ref 80.0–100.0)
Platelets: 237 10*3/uL (ref 150–400)
RBC: 4.04 MIL/uL (ref 3.87–5.11)
RDW: 14.7 % (ref 11.5–15.5)
WBC: 4.7 10*3/uL (ref 4.0–10.5)
nRBC: 0 % (ref 0.0–0.2)

## 2019-08-31 LAB — BASIC METABOLIC PANEL
Anion gap: 10 (ref 5–15)
BUN: 11 mg/dL (ref 8–23)
CO2: 22 mmol/L (ref 22–32)
Calcium: 9.4 mg/dL (ref 8.9–10.3)
Chloride: 107 mmol/L (ref 98–111)
Creatinine, Ser: 0.69 mg/dL (ref 0.44–1.00)
GFR calc Af Amer: >60 mL/min (ref >60)
GFR calc non Af Amer: >60 mL/min (ref >60)
Glucose, Bld: 114 mg/dL — ABNORMAL HIGH (ref 70–99)
Potassium: 4 mmol/L (ref 3.5–5.1)
Sodium: 139 mmol/L (ref 135–145)

## 2019-08-31 LAB — GLUCOSE, CAPILLARY: Glucose-Capillary: 98 mg/dL (ref 70–99)

## 2019-08-31 LAB — SARS CORONAVIRUS 2 (TAT 6-24 HRS): SARS Coronavirus 2: NEGATIVE

## 2019-08-31 LAB — SURGICAL PCR SCREEN
MRSA, PCR: NEGATIVE
Staphylococcus aureus: POSITIVE — AB

## 2019-08-31 NOTE — Progress Notes (Signed)
PCP - Dr. Junious Dresser Cardiologist - Dr. Antoine Poche saw pt last in 2019, primary cardiologist in [place] pt can not remember name.   PPM/ICD - N/A Device Orders -N/A  Rep Notified - N/A  Chest x-ray - N/A EKG - 08/31/19 Stress Test -04/02/17  ECHO - 04/02/17 Cardiac Cath - 04/05/17  Sleep Study - OSA+ CPAP - Uses nightly, pt instructed to bring mask on the DOS.   Fasting Blood Sugar - 97/127 Checks Blood Sugar ___2__ times a day  Blood Thinner Instructions: Hold Eliquis from 3 days prior to surgery. LD 08/30/19 Aspirin Instructions:Hold 3 days prior to surgery. LD 08/30/19  ERAS Protcol -N/A PRE-SURGERY Ensure or G2- N/A  COVID TEST- Pt scheduled for COVID testing today. Pt given quarantine instructions and verbalizes agreement.    Anesthesia review: Yes, hx of CAD.   Patient denies shortness of breath, fever, cough and chest pain at PAT appointment   All instructions explained to the patient, with a verbal understanding of the material. Patient agrees to go over the instructions while at home for a better understanding. Patient also instructed to self quarantine after being tested for COVID-19. The opportunity to ask questions was provided.    Coronavirus Screening  Have you experienced the following symptoms:  Cough yes/no: No Fever (>100.62F)  yes/no: No Runny nose yes/no: No Sore throat yes/no: No Difficulty breathing/shortness of breath  yes/no: No  Have you or a family member traveled in the last 14 days and where? yes/no: No   If the patient indicates "YES" to the above questions, their PAT will be rescheduled to limit the exposure to others and, the surgeon will be notified. THE PATIENT WILL NEED TO BE ASYMPTOMATIC FOR 14 DAYS.   If the patient is not experiencing any of these symptoms, the PAT nurse will instruct them to NOT bring anyone with them to their appointment since they may have these symptoms or traveled as well.   Please remind your patients and  families that hospital visitation restrictions are in effect and the importance of the restrictions.

## 2019-09-01 LAB — HEMOGLOBIN A1C
Hgb A1c MFr Bld: 5.6 % (ref 4.8–5.6)
Mean Plasma Glucose: 114 mg/dL

## 2019-09-01 LAB — HEPATIC FUNCTION PANEL
ALT: 22 U/L (ref 0–44)
AST: 27 U/L (ref 15–41)
Albumin: 3.7 g/dL (ref 3.5–5.0)
Alkaline Phosphatase: 94 U/L (ref 38–126)
Bilirubin, Direct: 0.1 mg/dL (ref 0.0–0.2)
Indirect Bilirubin: 0.3 mg/dL (ref 0.3–0.9)
Total Bilirubin: 0.4 mg/dL (ref 0.3–1.2)
Total Protein: 7.3 g/dL (ref 6.5–8.1)

## 2019-09-01 NOTE — Anesthesia Preprocedure Evaluation (Addendum)
Anesthesia Evaluation  Patient identified by MRN, date of birth, ID band Patient awake    Reviewed: Allergy & Precautions, NPO status , Patient's Chart, lab work & pertinent test results  Airway Mallampati: III  TM Distance: >3 FB Neck ROM: Full    Dental  (+) Teeth Intact, Dental Advisory Given   Pulmonary Current Smoker and Patient abstained from smoking.,    breath sounds clear to auscultation       Cardiovascular hypertension,  Rhythm:Regular Rate:Normal     Neuro/Psych    GI/Hepatic   Endo/Other  diabetes  Renal/GU      Musculoskeletal   Abdominal   Peds  Hematology   Anesthesia Other Findings   Reproductive/Obstetrics                            Anesthesia Physical Anesthesia Plan  ASA: III  Anesthesia Plan: General   Post-op Pain Management:    Induction: Intravenous  PONV Risk Score and Plan: Ondansetron and Dexamethasone  Airway Management Planned: Oral ETT  Additional Equipment:   Intra-op Plan:   Post-operative Plan: Extubation in OR  Informed Consent: I have reviewed the patients History and Physical, chart, labs and discussed the procedure including the risks, benefits and alternatives for the proposed anesthesia with the patient or authorized representative who has indicated his/her understanding and acceptance.     Dental advisory given  Plan Discussed with: CRNA and Anesthesiologist  Anesthesia Plan Comments: (She was admitted 11/29-11/30/19 for eval of chest pain, reassuring workup. Per discharge summary,  "She was previously admitted to [facility] in 03/2017 withasharper type ofchest pain.A cardiac stress test was done which was positive for reversible ischemia of the anterior septal and inferior lateral wall. She subsequently underwent cath with 30% LM, 30% ostial LAD, 45% mLAD, 50% dLAD, 30% prox RCA, no flow limiting lesions, LVEF 55-65%.Marland KitchenMarland KitchenToday while at  rest around 10:30am she developed sudden substernal chest discomfort described as a dull ache associated with nausea. It migrated to epigastric region and left shoulder blade. She went and laid down and felt somewhat better. However, due to persistent sx she alerted facility staff. She was given 2 SL NTG a few minutes apart, each prompting some relief. When she got to the ER she was given an additional SL NTG which afforded more relief. She has since had a NTG patch placed and remains pain free.Marland KitchenMarland KitchenSerial troponin was negative.  Her chest pain resolved and has not recurred again.  EKG was normal as well.  Patient was taken off of nitroglycerin patch on 06/21/2018 and was able to ambulate without any recurrent exertional chest discomfort.  She is deemed stable for discharge from cardiology perspective."  History of remote PICC-induced DVT onanticoagulation (managed by PCP). LD Eliquis 2/7 per pt.  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 Preop labs show normal hepatic function.  Surgical clearance from pt's PCP Dr. Orvan Falconer states pt is high risk. I called and spoke with Dr. Orvan Falconer regarding her risk. He advised that he assessed her as high risk due to her multiple comorbidites. He said she has had several surgeries in recent years without complication and most of her chronic conditions are pretty well-controlled, but he marked high risk just to alert the surgeon and anesthesia that she does have a history most notable for COPD, OSA on CPAP, DMII, and morbid obesity, making her a higher risk patient. He did not express any specific concerns with her proceeding  with surgery and no further workup was advised.   EKG 08/31/19: NSR. Low voltage QRS. Rate 87. Cannot rule out Anteroseptal infarct , age undetermined. No significant change since last tracing  LHC 04/05/2017: The left ventricular systolic function is normal. The left ventricular ejection fraction is 55-65% by visual  estimate. LV end diastolic pressure is mildly elevated. ___________________________________ LM lesion, 30 %stenosed. Ost LAD lesion, 30 %stenosed. Mid LAD lesion, 45 %stenosed. Dist LAD lesion, 50 %stenosed. - Combined FFR with these lesions was 0.87. Not physiologically significant. Prox RCA lesion, 30 %stenosed and the remainder of the mid vessel is diffusely diseased but mild. No significant flow-limiting lesions  No flow-limiting lesions takes when the patient several stress test. The 2 lesions in the LAD are angiographically not significant and not significant by FFR.  Either of these 2 lesions could potentially be a focal site for coronary spasm however.  Her stress test would be considered false positive.  Recommendation: Considered other noncardiac etiology for chest pain versus potential coronary spasm. Would be stable for discharge from a cardiac Standpoint after bedrest. Defer to primary team & consult service. )      Anesthesia Quick Evaluation

## 2019-09-01 NOTE — Progress Notes (Addendum)
Anesthesia Chart Review:  She was admitted 11/29-11/30/19 for eval of chest pain, reassuring workup. Per discharge summary,  "She was previously admitted to [facility] in 03/2017 withasharper type ofchest pain.A cardiac stress test was done which was positive for reversible ischemia of the anterior septal and inferior lateral wall. She subsequently underwent cath with 30% LM, 30% ostial LAD, 45% mLAD, 50% dLAD, 30% prox RCA, no flow limiting lesions, LVEF 55-65%.Marland KitchenMarland KitchenToday while at rest around 10:30am she developed sudden substernal chest discomfort described as a dull ache associated with nausea. It migrated to epigastric region and left shoulder blade. She went and laid down and felt somewhat better. However, due to persistent sx she alerted facility staff. She was given 2 SL NTG a few minutes apart, each prompting some relief. When she got to the ER she was given an additional SL NTG which afforded more relief. She has since had a NTG patch placed and remains pain free.Marland KitchenMarland KitchenSerial troponin was negative.  Her chest pain resolved and has not recurred again.  EKG was normal as well.  Patient was taken off of nitroglycerin patch on 06/21/2018 and was able to ambulate without any recurrent exertional chest discomfort.  She is deemed stable for discharge from cardiology perspective."  History of remote PICC-induced DVT onanticoagulation (managed by PCP). LD Eliquis 2/7 per pt.  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 Preop labs show normal hepatic function.  Surgical clearance from pt's PCP Dr. Orvan Falconer states pt is high risk. I called and spoke with Dr. Orvan Falconer regarding her risk. He advised that he assessed her as high risk due to her multiple comorbidites. He said she has had several surgeries in recent years without complication and most of her chronic conditions are pretty well-controlled, but he marked high risk just to alert the surgeon and anesthesia that she does have a  history most notable for COPD, OSA on CPAP, DMII, and morbid obesity, making her a higher risk patient. He did not express any specific concerns with her proceeding with surgery and no further workup was advised.   EKG 08/31/19: NSR. Low voltage QRS. Rate 87. Cannot rule out Anteroseptal infarct , age undetermined. No significant change since last tracing  LHC 04/05/2017:  The left ventricular systolic function is normal. The left ventricular ejection fraction is 55-65% by visual estimate.  LV end diastolic pressure is mildly elevated.  ___________________________________  LM lesion, 30 %stenosed.  Ost LAD lesion, 30 %stenosed. Mid LAD lesion, 45 %stenosed. Dist LAD lesion, 50 %stenosed. - Combined FFR with these lesions was 0.87. Not physiologically significant.  Prox RCA lesion, 30 %stenosed and the remainder of the mid vessel is diffusely diseased but mild. No significant flow-limiting lesions   No flow-limiting lesions takes when the patient several stress test. The 2 lesions in the LAD are angiographically not significant and not significant by FFR.  Either of these 2 lesions could potentially be a focal site for coronary spasm however.  Her stress test would be considered false positive.  Recommendation: Considered other noncardiac etiology for chest pain versus potential coronary spasm. Would be stable for discharge from a cardiac Standpoint after bedrest. Defer to primary team & consult service.   Zannie Cove [facility] Short Stay Center/Anesthesiology Phone [phone] 09/01/2019 10:50 AM

## 2019-09-02 MED ORDER — VANCOMYCIN HCL 1500 MG/300ML IV SOLN
1500.0000 mg | INTRAVENOUS | Status: AC
  Administered 2019-09-03: 10:00:00 1500 mg via INTRAVENOUS
  Filled 2019-09-02 (×2): qty 300

## 2019-09-03 ENCOUNTER — Encounter (HOSPITAL_COMMUNITY): Payer: Self-pay | Admitting: Neurosurgery

## 2019-09-03 ENCOUNTER — Ambulatory Visit (HOSPITAL_COMMUNITY): Payer: Medicaid Other | Admitting: Physician Assistant

## 2019-09-03 ENCOUNTER — Encounter (HOSPITAL_COMMUNITY)
Admission: RE | Disposition: A | Discharge status: Skilled Nursing Facility | Payer: Self-pay | Source: Home / Self Care | Attending: Neurosurgery

## 2019-09-03 ENCOUNTER — Other Ambulatory Visit: Payer: Self-pay

## 2019-09-03 ENCOUNTER — Ambulatory Visit (HOSPITAL_COMMUNITY)
Admission: RE | Admit: 2019-09-03 | Discharge: 2019-09-04 | Disposition: A | Discharge status: Skilled Nursing Facility | Payer: Medicaid Other | Attending: Neurosurgery | Admitting: Neurosurgery

## 2019-09-03 ENCOUNTER — Ambulatory Visit (HOSPITAL_COMMUNITY): Payer: Medicaid Other

## 2019-09-03 DIAGNOSIS — Z823 Family history of stroke: Secondary | ICD-10-CM | POA: Insufficient documentation

## 2019-09-03 DIAGNOSIS — Z96653 Presence of artificial knee joint, bilateral: Secondary | ICD-10-CM | POA: Insufficient documentation

## 2019-09-03 DIAGNOSIS — Z800 Family history of malignant neoplasm of digestive organs: Secondary | ICD-10-CM | POA: Insufficient documentation

## 2019-09-03 DIAGNOSIS — M5116 Intervertebral disc disorders with radiculopathy, lumbar region: Secondary | ICD-10-CM | POA: Insufficient documentation

## 2019-09-03 DIAGNOSIS — Z833 Family history of diabetes mellitus: Secondary | ICD-10-CM | POA: Insufficient documentation

## 2019-09-03 DIAGNOSIS — I2510 Atherosclerotic heart disease of native coronary artery without angina pectoris: Secondary | ICD-10-CM | POA: Insufficient documentation

## 2019-09-03 DIAGNOSIS — K219 Gastro-esophageal reflux disease without esophagitis: Secondary | ICD-10-CM | POA: Insufficient documentation

## 2019-09-03 DIAGNOSIS — M1990 Unspecified osteoarthritis, unspecified site: Secondary | ICD-10-CM | POA: Diagnosis not present

## 2019-09-03 DIAGNOSIS — Z96642 Presence of left artificial hip joint: Secondary | ICD-10-CM | POA: Insufficient documentation

## 2019-09-03 DIAGNOSIS — Z7982 Long term (current) use of aspirin: Secondary | ICD-10-CM | POA: Insufficient documentation

## 2019-09-03 DIAGNOSIS — Z8249 Family history of ischemic heart disease and other diseases of the circulatory system: Secondary | ICD-10-CM | POA: Insufficient documentation

## 2019-09-03 DIAGNOSIS — Z886 Allergy status to analgesic agent status: Secondary | ICD-10-CM | POA: Insufficient documentation

## 2019-09-03 DIAGNOSIS — Z9049 Acquired absence of other specified parts of digestive tract: Secondary | ICD-10-CM | POA: Insufficient documentation

## 2019-09-03 DIAGNOSIS — Z90710 Acquired absence of both cervix and uterus: Secondary | ICD-10-CM | POA: Insufficient documentation

## 2019-09-03 DIAGNOSIS — E669 Obesity, unspecified: Secondary | ICD-10-CM | POA: Insufficient documentation

## 2019-09-03 DIAGNOSIS — I10 Essential (primary) hypertension: Secondary | ICD-10-CM | POA: Insufficient documentation

## 2019-09-03 DIAGNOSIS — E119 Type 2 diabetes mellitus without complications: Secondary | ICD-10-CM | POA: Insufficient documentation

## 2019-09-03 DIAGNOSIS — M5126 Other intervertebral disc displacement, lumbar region: Secondary | ICD-10-CM | POA: Diagnosis present

## 2019-09-03 DIAGNOSIS — J449 Chronic obstructive pulmonary disease, unspecified: Secondary | ICD-10-CM | POA: Diagnosis not present

## 2019-09-03 DIAGNOSIS — Z7901 Long term (current) use of anticoagulants: Secondary | ICD-10-CM | POA: Insufficient documentation

## 2019-09-03 DIAGNOSIS — F17210 Nicotine dependence, cigarettes, uncomplicated: Secondary | ICD-10-CM | POA: Insufficient documentation

## 2019-09-03 DIAGNOSIS — E7800 Pure hypercholesterolemia, unspecified: Secondary | ICD-10-CM | POA: Insufficient documentation

## 2019-09-03 DIAGNOSIS — Z79899 Other long term (current) drug therapy: Secondary | ICD-10-CM | POA: Insufficient documentation

## 2019-09-03 DIAGNOSIS — Z20822 Contact with and (suspected) exposure to covid-19: Secondary | ICD-10-CM | POA: Insufficient documentation

## 2019-09-03 DIAGNOSIS — Z6841 Body Mass Index (BMI) 40.0 and over, adult: Secondary | ICD-10-CM | POA: Insufficient documentation

## 2019-09-03 DIAGNOSIS — K754 Autoimmune hepatitis: Secondary | ICD-10-CM | POA: Insufficient documentation

## 2019-09-03 DIAGNOSIS — Z981 Arthrodesis status: Secondary | ICD-10-CM | POA: Insufficient documentation

## 2019-09-03 DIAGNOSIS — Z87442 Personal history of urinary calculi: Secondary | ICD-10-CM | POA: Insufficient documentation

## 2019-09-03 DIAGNOSIS — Z7984 Long term (current) use of oral hypoglycemic drugs: Secondary | ICD-10-CM | POA: Insufficient documentation

## 2019-09-03 DIAGNOSIS — Z419 Encounter for procedure for purposes other than remedying health state, unspecified: Secondary | ICD-10-CM

## 2019-09-03 DIAGNOSIS — Z791 Long term (current) use of non-steroidal anti-inflammatories (NSAID): Secondary | ICD-10-CM | POA: Insufficient documentation

## 2019-09-03 DIAGNOSIS — G4733 Obstructive sleep apnea (adult) (pediatric): Secondary | ICD-10-CM | POA: Insufficient documentation

## 2019-09-03 DIAGNOSIS — Z880 Allergy status to penicillin: Secondary | ICD-10-CM | POA: Insufficient documentation

## 2019-09-03 HISTORY — PX: LUMBAR LAMINECTOMY/DECOMPRESSION MICRODISCECTOMY: SHX5026

## 2019-09-03 LAB — HEMOGLOBIN A1C
Hgb A1c MFr Bld: 5.9 % — ABNORMAL HIGH (ref 4.8–5.6)
Mean Plasma Glucose: 122.63 mg/dL

## 2019-09-03 LAB — GLUCOSE, CAPILLARY
Glucose-Capillary: 130 mg/dL — ABNORMAL HIGH (ref 70–99)
Glucose-Capillary: 123 mg/dL — ABNORMAL HIGH (ref 70–99)
Glucose-Capillary: 177 mg/dL — ABNORMAL HIGH (ref 70–99)
Glucose-Capillary: 150 mg/dL — ABNORMAL HIGH (ref 70–99)

## 2019-09-03 LAB — PROTIME-INR
INR: 1 (ref 0.8–1.2)
Prothrombin Time: 12.9 seconds (ref 11.4–15.2)

## 2019-09-03 SURGERY — LUMBAR LAMINECTOMY/DECOMPRESSION MICRODISCECTOMY 1 LEVEL
Anesthesia: General | Site: Spine Lumbar | Laterality: Left

## 2019-09-03 MED ORDER — ONDANSETRON HCL 4 MG/2ML IJ SOLN: 4.0000 mg | Freq: Once | INTRAMUSCULAR | Status: DC | PRN

## 2019-09-03 MED ORDER — ONDANSETRON HCL 4 MG PO TABS: 4.0000 mg | ORAL_TABLET | Freq: Four times a day (QID) | ORAL | Status: DC | PRN

## 2019-09-03 MED ORDER — MENTHOL 3 MG MT LOZG: 1.0000 | LOZENGE | OROMUCOSAL | Status: DC | PRN

## 2019-09-03 MED ORDER — ONDANSETRON HCL 4 MG/2ML IJ SOLN: 4.0000 mg | Freq: Four times a day (QID) | INTRAMUSCULAR | Status: DC | PRN

## 2019-09-03 MED ORDER — OXYCODONE HCL 5 MG PO TABS: 5.0000 mg | ORAL_TABLET | ORAL | Status: DC | PRN

## 2019-09-03 MED ORDER — 0.9 % SODIUM CHLORIDE (POUR BTL) OPTIME
TOPICAL | Status: DC | PRN
  Administered 2019-09-03: 1000 mL

## 2019-09-03 MED ORDER — DOCUSATE SODIUM 100 MG PO CAPS
100.0000 mg | ORAL_CAPSULE | Freq: Two times a day (BID) | ORAL | Status: DC
  Administered 2019-09-03 – 2019-09-04 (×3): 100 mg via ORAL
  Filled 2019-09-03 (×3): qty 1

## 2019-09-03 MED ORDER — LACTATED RINGERS IV SOLN: INTRAVENOUS | Status: DC

## 2019-09-03 MED ORDER — MORPHINE SULFATE (PF) 4 MG/ML IV SOLN: 4.0000 mg | INTRAVENOUS | Status: DC | PRN

## 2019-09-03 MED ORDER — INSULIN ASPART 100 UNIT/ML ~~LOC~~ SOLN
0.0000 [IU] | SUBCUTANEOUS | Status: DC
  Administered 2019-09-03: 3 [IU] via SUBCUTANEOUS
  Administered 2019-09-03: 18:00:00 4 [IU] via SUBCUTANEOUS
  Administered 2019-09-04 (×2): 3 [IU] via SUBCUTANEOUS

## 2019-09-03 MED ORDER — GABAPENTIN 600 MG PO TABS
300.0000 mg | ORAL_TABLET | Freq: Three times a day (TID) | ORAL | Status: DC
  Administered 2019-09-03 – 2019-09-04 (×5): 300 mg via ORAL
  Filled 2019-09-03 (×5): qty 1

## 2019-09-03 MED ORDER — FENTANYL CITRATE (PF) 100 MCG/2ML IJ SOLN
INTRAMUSCULAR | Status: AC
  Administered 2019-09-03: 13:00:00 50 ug via INTRAVENOUS
  Filled 2019-09-03: qty 2

## 2019-09-03 MED ORDER — LIDOCAINE-EPINEPHRINE 1 %-1:100000 IJ SOLN
INTRAMUSCULAR | Status: AC
  Filled 2019-09-03: qty 1

## 2019-09-03 MED ORDER — BISACODYL 10 MG RE SUPP: 10.0000 mg | Freq: Every day | RECTAL | Status: DC | PRN

## 2019-09-03 MED ORDER — ZOLPIDEM TARTRATE 5 MG PO TABS: 5.0000 mg | ORAL_TABLET | Freq: Every evening | ORAL | Status: DC | PRN

## 2019-09-03 MED ORDER — SUGAMMADEX SODIUM 200 MG/2ML IV SOLN
INTRAVENOUS | Status: DC | PRN
  Administered 2019-09-03: 400 mg via INTRAVENOUS

## 2019-09-03 MED ORDER — PHENYLEPHRINE 40 MCG/ML (10ML) SYRINGE FOR IV PUSH (FOR BLOOD PRESSURE SUPPORT)
PREFILLED_SYRINGE | INTRAVENOUS | Status: DC | PRN
  Administered 2019-09-03 (×3): 80 ug via INTRAVENOUS
  Administered 2019-09-03: 40 ug via INTRAVENOUS

## 2019-09-03 MED ORDER — ALBUTEROL SULFATE (2.5 MG/3ML) 0.083% IN NEBU
2.5000 mg | INHALATION_SOLUTION | Freq: Three times a day (TID) | RESPIRATORY_TRACT | Status: DC
  Administered 2019-09-03 – 2019-09-04 (×2): 2.5 mg via RESPIRATORY_TRACT
  Filled 2019-09-03 (×2): qty 3

## 2019-09-03 MED ORDER — METFORMIN HCL 500 MG PO TABS
500.0000 mg | ORAL_TABLET | Freq: Two times a day (BID) | ORAL | Status: DC
  Administered 2019-09-03 – 2019-09-04 (×3): 500 mg via ORAL
  Filled 2019-09-03 (×3): qty 1

## 2019-09-03 MED ORDER — SODIUM CHLORIDE 0.9 % IV SOLN: 250.0000 mL | INTRAVENOUS | Status: DC

## 2019-09-03 MED ORDER — AZATHIOPRINE 50 MG PO TABS
100.0000 mg | ORAL_TABLET | Freq: Every day | ORAL | Status: DC
  Administered 2019-09-04: 10:00:00 100 mg via ORAL
  Filled 2019-09-03: qty 2

## 2019-09-03 MED ORDER — LIDOCAINE-EPINEPHRINE 1 %-1:100000 IJ SOLN
INTRAMUSCULAR | Status: DC | PRN
  Administered 2019-09-03 (×2): 10 mL

## 2019-09-03 MED ORDER — TIZANIDINE HCL 4 MG PO TABS
2.0000 mg | ORAL_TABLET | Freq: Three times a day (TID) | ORAL | Status: DC | PRN
  Administered 2019-09-03 – 2019-09-04 (×3): 2 mg via ORAL
  Filled 2019-09-03 (×3): qty 1

## 2019-09-03 MED ORDER — SODIUM CHLORIDE 0.9% FLUSH
3.0000 mL | Freq: Two times a day (BID) | INTRAVENOUS | Status: DC
  Administered 2019-09-03: 23:00:00 3 mL via INTRAVENOUS

## 2019-09-03 MED ORDER — ALBUTEROL SULFATE HFA 108 (90 BASE) MCG/ACT IN AERS: 1.0000 | INHALATION_SPRAY | Freq: Four times a day (QID) | RESPIRATORY_TRACT | Status: DC | PRN

## 2019-09-03 MED ORDER — ROCURONIUM BROMIDE 100 MG/10ML IV SOLN
INTRAVENOUS | Status: DC | PRN
  Administered 2019-09-03 (×2): 10 mg via INTRAVENOUS
  Administered 2019-09-03: 60 mg via INTRAVENOUS

## 2019-09-03 MED ORDER — MORPHINE SULFATE ER 15 MG PO TBCR
15.0000 mg | EXTENDED_RELEASE_TABLET | Freq: Two times a day (BID) | ORAL | Status: DC
  Administered 2019-09-03 – 2019-09-04 (×3): 15 mg via ORAL
  Filled 2019-09-03 (×3): qty 1

## 2019-09-03 MED ORDER — PANTOPRAZOLE SODIUM 40 MG PO TBEC
40.0000 mg | DELAYED_RELEASE_TABLET | Freq: Every day | ORAL | Status: DC
  Administered 2019-09-04: 10:00:00 40 mg via ORAL
  Filled 2019-09-03: qty 1

## 2019-09-03 MED ORDER — DEXAMETHASONE SODIUM PHOSPHATE 10 MG/ML IJ SOLN
INTRAMUSCULAR | Status: DC | PRN
  Administered 2019-09-03: 4 mg via INTRAVENOUS

## 2019-09-03 MED ORDER — BUPROPION HCL ER (XL) 150 MG PO TB24
150.0000 mg | ORAL_TABLET | Freq: Every day | ORAL | Status: DC
  Administered 2019-09-04: 10:00:00 150 mg via ORAL
  Filled 2019-09-03: qty 1

## 2019-09-03 MED ORDER — PHENOL 1.4 % MT LIQD
1.0000 | OROMUCOSAL | Status: DC | PRN
  Administered 2019-09-04: 11:00:00 1 via OROMUCOSAL
  Filled 2019-09-03: qty 177

## 2019-09-03 MED ORDER — VANCOMYCIN HCL 1500 MG/300ML IV SOLN
1500.0000 mg | Freq: Once | INTRAVENOUS | Status: AC
  Administered 2019-09-03: 1500 mg via INTRAVENOUS
  Filled 2019-09-03: qty 300

## 2019-09-03 MED ORDER — UMECLIDINIUM-VILANTEROL 62.5-25 MCG/INH IN AEPB
1.0000 | INHALATION_SPRAY | Freq: Every day | RESPIRATORY_TRACT | Status: DC
  Administered 2019-09-04: 08:00:00 1 via RESPIRATORY_TRACT
  Filled 2019-09-03: qty 14

## 2019-09-03 MED ORDER — ONDANSETRON HCL 4 MG/2ML IJ SOLN
INTRAMUSCULAR | Status: DC | PRN
  Administered 2019-09-03: 4 mg via INTRAVENOUS

## 2019-09-03 MED ORDER — HYDRALAZINE HCL 10 MG PO TABS
10.0000 mg | ORAL_TABLET | Freq: Four times a day (QID) | ORAL | Status: DC
  Administered 2019-09-03 – 2019-09-04 (×6): 10 mg via ORAL
  Filled 2019-09-03 (×5): qty 1

## 2019-09-03 MED ORDER — SODIUM CHLORIDE 0.9 % IV SOLN: INTRAVENOUS | Status: DC | PRN

## 2019-09-03 MED ORDER — CHLORHEXIDINE GLUCONATE CLOTH 2 % EX PADS: 6.0000 | MEDICATED_PAD | Freq: Once | CUTANEOUS | Status: DC

## 2019-09-03 MED ORDER — FENOFIBRATE 160 MG PO TABS
160.0000 mg | ORAL_TABLET | Freq: Every day | ORAL | Status: DC
  Administered 2019-09-03 – 2019-09-04 (×2): 160 mg via ORAL
  Filled 2019-09-03 (×2): qty 1

## 2019-09-03 MED ORDER — ACETAMINOPHEN 325 MG PO TABS: 650.0000 mg | ORAL_TABLET | ORAL | Status: DC | PRN

## 2019-09-03 MED ORDER — LIDOCAINE 2% (20 MG/ML) 5 ML SYRINGE
INTRAMUSCULAR | Status: DC | PRN
  Administered 2019-09-03: 50 mg via INTRAVENOUS

## 2019-09-03 MED ORDER — OXYCODONE HCL 5 MG/5ML PO SOLN: 5.0000 mg | Freq: Once | ORAL | Status: AC | PRN

## 2019-09-03 MED ORDER — PHENYLEPHRINE 40 MCG/ML (10ML) SYRINGE FOR IV PUSH (FOR BLOOD PRESSURE SUPPORT)
PREFILLED_SYRINGE | INTRAVENOUS | Status: AC
  Filled 2019-09-03: qty 10

## 2019-09-03 MED ORDER — FENTANYL CITRATE (PF) 250 MCG/5ML IJ SOLN
INTRAMUSCULAR | Status: DC | PRN
  Administered 2019-09-03: 50 ug via INTRAVENOUS
  Administered 2019-09-03: 100 ug via INTRAVENOUS

## 2019-09-03 MED ORDER — FENTANYL CITRATE (PF) 100 MCG/2ML IJ SOLN
INTRAMUSCULAR | Status: AC
  Filled 2019-09-03: qty 2

## 2019-09-03 MED ORDER — MECLIZINE HCL 25 MG PO TABS
25.0000 mg | ORAL_TABLET | Freq: Three times a day (TID) | ORAL | Status: DC | PRN
  Filled 2019-09-03: qty 1

## 2019-09-03 MED ORDER — ACETAMINOPHEN 650 MG RE SUPP: 650.0000 mg | RECTAL | Status: DC | PRN

## 2019-09-03 MED ORDER — FENTANYL CITRATE (PF) 250 MCG/5ML IJ SOLN
INTRAMUSCULAR | Status: AC
  Filled 2019-09-03: qty 5

## 2019-09-03 MED ORDER — ACETAMINOPHEN 500 MG PO TABS: 1000.0000 mg | ORAL_TABLET | Freq: Four times a day (QID) | ORAL | Status: DC

## 2019-09-03 MED ORDER — BACITRACIN ZINC 500 UNIT/GM EX OINT
TOPICAL_OINTMENT | CUTANEOUS | Status: AC
  Filled 2019-09-03: qty 28.35

## 2019-09-03 MED ORDER — VASOPRESSIN 20 UNIT/ML IV SOLN
INTRAVENOUS | Status: AC
  Filled 2019-09-03: qty 1

## 2019-09-03 MED ORDER — LIRAGLUTIDE 18 MG/3ML ~~LOC~~ SOPN: 1.8000 mg | PEN_INJECTOR | Freq: Every day | SUBCUTANEOUS | Status: DC

## 2019-09-03 MED ORDER — AMLODIPINE BESYLATE 5 MG PO TABS
5.0000 mg | ORAL_TABLET | Freq: Every day | ORAL | Status: DC
  Administered 2019-09-04: 5 mg via ORAL
  Filled 2019-09-03: qty 1

## 2019-09-03 MED ORDER — OXYCODONE HCL 5 MG PO TABS
10.0000 mg | ORAL_TABLET | ORAL | Status: DC | PRN
  Administered 2019-09-03 – 2019-09-04 (×5): 10 mg via ORAL
  Filled 2019-09-03 (×5): qty 2

## 2019-09-03 MED ORDER — BACITRACIN ZINC 500 UNIT/GM EX OINT
TOPICAL_OINTMENT | CUTANEOUS | Status: DC | PRN
  Administered 2019-09-03: 1 via TOPICAL

## 2019-09-03 MED ORDER — OXYCODONE HCL 5 MG PO TABS
ORAL_TABLET | ORAL | Status: AC
  Filled 2019-09-03: qty 1

## 2019-09-03 MED ORDER — SODIUM CHLORIDE 0.9% FLUSH: 3.0000 mL | INTRAVENOUS | Status: DC | PRN

## 2019-09-03 MED ORDER — THROMBIN 5000 UNITS EX SOLR
CUTANEOUS | Status: AC
  Filled 2019-09-03: qty 15000

## 2019-09-03 MED ORDER — MIDAZOLAM HCL 2 MG/2ML IJ SOLN
INTRAMUSCULAR | Status: AC
  Filled 2019-09-03: qty 2

## 2019-09-03 MED ORDER — OXYCODONE HCL 5 MG PO TABS
5.0000 mg | ORAL_TABLET | Freq: Once | ORAL | Status: AC | PRN
  Administered 2019-09-03: 5 mg via ORAL

## 2019-09-03 MED ORDER — PHENYLEPHRINE HCL-NACL 10-0.9 MG/250ML-% IV SOLN
INTRAVENOUS | Status: DC | PRN
  Administered 2019-09-03: 50 ug/min via INTRAVENOUS

## 2019-09-03 MED ORDER — MIDAZOLAM HCL 5 MG/5ML IJ SOLN
INTRAMUSCULAR | Status: DC | PRN
  Administered 2019-09-03: 2 mg via INTRAVENOUS

## 2019-09-03 MED ORDER — PROPOFOL 10 MG/ML IV BOLUS
INTRAVENOUS | Status: DC | PRN
  Administered 2019-09-03: 200 mg via INTRAVENOUS

## 2019-09-03 MED ORDER — TIOTROPIUM BROMIDE-OLODATEROL 2.5-2.5 MCG/ACT IN AERS: INHALATION_SPRAY | Freq: Every day | RESPIRATORY_TRACT | Status: DC

## 2019-09-03 MED ORDER — ALBUTEROL SULFATE (2.5 MG/3ML) 0.083% IN NEBU: 2.5000 mg | INHALATION_SOLUTION | Freq: Four times a day (QID) | RESPIRATORY_TRACT | Status: DC | PRN

## 2019-09-03 MED ORDER — FESOTERODINE FUMARATE ER 8 MG PO TB24
8.0000 mg | ORAL_TABLET | Freq: Every day | ORAL | Status: DC
  Administered 2019-09-04: 10:00:00 8 mg via ORAL
  Filled 2019-09-03: qty 1

## 2019-09-03 MED ORDER — MORPHINE SULFATE 15 MG PO TABS: 15.0000 mg | ORAL_TABLET | Freq: Two times a day (BID) | ORAL | Status: DC

## 2019-09-03 MED ORDER — THROMBIN 5000 UNITS EX SOLR: OROMUCOSAL | Status: DC | PRN

## 2019-09-03 MED ORDER — FENTANYL CITRATE (PF) 100 MCG/2ML IJ SOLN
25.0000 ug | INTRAMUSCULAR | Status: DC | PRN
  Administered 2019-09-03: 50 ug via INTRAVENOUS
  Administered 2019-09-03: 25 ug via INTRAVENOUS

## 2019-09-03 SURGICAL SUPPLY — 43 items
BAG DECANTER FOR FLEXI CONT (MISCELLANEOUS) ×3 IMPLANT
BENZOIN TINCTURE PRP APPL 2/3 (GAUZE/BANDAGES/DRESSINGS) ×3 IMPLANT
BUR MATCHSTICK NEURO 3.0 LAGG (BURR) ×3 IMPLANT
BUR PRECISION FLUTE 6.0 (BURR) ×3 IMPLANT
CANISTER SUCT 3000ML PPV (MISCELLANEOUS) ×3 IMPLANT
CARTRIDGE OIL MAESTRO DRILL (MISCELLANEOUS) ×1 IMPLANT
CLOSURE WOUND 1/2 X4 (GAUZE/BANDAGES/DRESSINGS) ×1
DIFFUSER DRILL AIR PNEUMATIC (MISCELLANEOUS) ×3 IMPLANT
DRAPE LAPAROTOMY 100X72X124 (DRAPES) ×3 IMPLANT
DRAPE MICROSCOPE LEICA (MISCELLANEOUS) ×3 IMPLANT
DRAPE SURG 17X23 STRL (DRAPES) ×12 IMPLANT
DRSG OPSITE POSTOP 4X6 (GAUZE/BANDAGES/DRESSINGS) ×2 IMPLANT
ELECT BLADE 4.0 EZ CLEAN MEGAD (MISCELLANEOUS) ×3
ELECT REM PT RETURN 9FT ADLT (ELECTROSURGICAL) ×3
ELECTRODE BLDE 4.0 EZ CLN MEGD (MISCELLANEOUS) ×1 IMPLANT
ELECTRODE REM PT RTRN 9FT ADLT (ELECTROSURGICAL) ×1 IMPLANT
GLOVE BIO SURGEON STRL SZ8 (GLOVE) ×5 IMPLANT
GLOVE BIO SURGEON STRL SZ8.5 (GLOVE) ×5 IMPLANT
GLOVE ECLIPSE 6.5 STRL STRAW (GLOVE) ×2 IMPLANT
GLOVE ECLIPSE 7.5 STRL STRAW (GLOVE) ×6 IMPLANT
GLOVE INDICATOR 8.0 STRL GRN (GLOVE) ×6 IMPLANT
GOWN STRL REUS W/ TWL LRG LVL3 (GOWN DISPOSABLE) IMPLANT
GOWN STRL REUS W/ TWL XL LVL3 (GOWN DISPOSABLE) ×1 IMPLANT
GOWN STRL REUS W/TWL 2XL LVL3 (GOWN DISPOSABLE) ×4 IMPLANT
GOWN STRL REUS W/TWL LRG LVL3 (GOWN DISPOSABLE) ×4
GOWN STRL REUS W/TWL XL LVL3 (GOWN DISPOSABLE) ×2
HEMOSTAT POWDER KIT SURGIFOAM (HEMOSTASIS) ×2 IMPLANT
KIT BASIN OR (CUSTOM PROCEDURE TRAY) ×3 IMPLANT
KIT TURNOVER KIT B (KITS) ×3 IMPLANT
NDL HYPO 21X1.5 SAFETY (NEEDLE) IMPLANT
NEEDLE HYPO 21X1.5 SAFETY (NEEDLE) IMPLANT
NEEDLE HYPO 22GX1.5 SAFETY (NEEDLE) ×3 IMPLANT
NS IRRIG 1000ML POUR BTL (IV SOLUTION) ×3 IMPLANT
OIL CARTRIDGE MAESTRO DRILL (MISCELLANEOUS) ×3
PACK LAMINECTOMY NEURO (CUSTOM PROCEDURE TRAY) ×3 IMPLANT
PATTIES SURGICAL .5 X1 (DISPOSABLE) IMPLANT
STRIP CLOSURE SKIN 1/2X4 (GAUZE/BANDAGES/DRESSINGS) ×2 IMPLANT
SUT VIC AB 1 CT1 18XBRD ANBCTR (SUTURE) ×1 IMPLANT
SUT VIC AB 1 CT1 8-18 (SUTURE) ×4
SUT VIC AB 2-0 CP2 18 (SUTURE) ×5 IMPLANT
TOWEL GREEN STERILE (TOWEL DISPOSABLE) ×3 IMPLANT
TOWEL GREEN STERILE FF (TOWEL DISPOSABLE) ×3 IMPLANT
WATER STERILE IRR 1000ML POUR (IV SOLUTION) ×3 IMPLANT

## 2019-09-03 NOTE — Progress Notes (Signed)
RT set up cpap for patient, pt stated she could place it on herself when ready for bed. RT made pt aware to call if she needs help.

## 2019-09-03 NOTE — Anesthesia Procedure Notes (Signed)
Procedure Name: Intubation Date/Time: 09/03/2019 10:34 AM Performed by: Audie Pinto, CRNA Pre-anesthesia Checklist: Patient identified, Emergency Drugs available, Suction available and Patient being monitored Patient Re-evaluated:Patient Re-evaluated prior to induction Oxygen Delivery Method: Circle system utilized Preoxygenation: Pre-oxygenation with 100% oxygen Induction Type: IV induction Ventilation: Oral airway inserted - appropriate to patient size and Two handed mask ventilation required Laryngoscope Size: Miller and 2 Grade View: Grade II Tube type: Oral Tube size: 7.0 mm Number of attempts: 1 Airway Equipment and Method: Stylet and Oral airway Placement Confirmation: ETT inserted through vocal cords under direct vision,  positive ETCO2 and breath sounds checked- equal and bilateral Secured at: 21 cm Tube secured with: Tape Dental Injury: Teeth and Oropharynx as per pre-operative assessment  Comments: Two hand mask ventilation with oral airway in place, masked with sevo. Intubation by R. John Giovanni

## 2019-09-03 NOTE — Op Note (Signed)
Brief history: The patient is a 64 year old black female on whom I previously performed a lumbar fusion and lumbar discectomy.  She has developed recurrent back and left great and right leg pain.  She has failed medical management.  She was worked with a lumbar MRI which demonstrated a recurrent herniated disc at L2-3 on the left.  I discussed the various treatment options with her.  She has decided to proceed with surgery after weighing the risks, benefits and alternatives.  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 Delma Officer  Asst.: Dr. Barbaraann Barthel  Anesthesia: Gen. endotracheal  Estimated blood loss: 75 cc  Drains: None  Complications: None  Description of procedure: The patient was brought to the operating room by the anesthesia team. General endotracheal anesthesia was induced. The patient was turned to the prone position on the Wilson frame. The patient's lumbosacral region was then prepared with Betadine scrub and Betadine solution. Sterile drapes were applied.  I then injected the area to be incised with Marcaine with epinephrine solution. I then used a scalpel to make a linear midline incision over the L2-3 intervertebral disc space, incising through the old surgical scar. I then used electrocautery to perform a left sided subperiosteal dissection exposing the spinous process and lamina of L2 and L3. We obtained intraoperative radiograph to confirm our location. I then inserted the [facility] retractor for exposure.  We then brought the operative microscope into the field. Under its magnification and illumination we completed the microdissection. I used a high-speed drill to perform a redo laminotomy at L2-3 on the left.  I extended the prior laminotomy until I encountered some relatively nonscarred down dura.   I then used a Kerrison punches to widen the laminotomy and removed epidural scar tissue L2-3 on the left. We then used microdissection to free up the thecal sac from the scar tissue and the left L3 nerve root from the epidural tissue. I then used a Kerrison punch to perform a foraminotomy at about the left L3 nerve root.  Dr. Mikal Plane then used the nerve root retractor to gently retract the thecal sac and the left L3 nerve root medially. This exposed the herniated disc.  We removed it in multiple fragments using the pituitary forceps decompressing the thecal sac and the L3 nerve root.  We inspected the intervertebral disc.  I did not see any large holes in the annulus nor impending herniations.  We therefore did not perform an intervertebral discectomy.   I then palpated along the ventral surface of the thecal sac and along exit route of the left L3 nerve root and noted that the neural structures were well decompressed. This completed the decompression.  We then obtained hemostasis using bipolar electrocautery. We irrigated the wound out with bacitracin solution. We then removed the retractor. We then reapproximated the patient's thoracolumbar fascia with interrupted #1 Vicryl suture. We then reapproximated the patient's subcutaneous tissue with interrupted 2-0 Vicryl suture. We then reapproximated patient's skin with Steri-Strips and benzoin. The was then coated with bacitracin ointment. The drapes were removed. The patient was subsequently returned to the supine position where they were extubated by the anesthesia team. The patient was then transported to the postanesthesia care unit in stable condition. All sponge instrument and needle counts were reportedly correct at the end of this case.

## 2019-09-03 NOTE — Transfer of Care (Signed)
Immediate Anesthesia Transfer of Care Note  Patient: Zipporah Plants  Procedure(s) Performed: LEFT LUMBAR TWO- LUMBAR THREE MICRODISCECTOMY (Left Spine Lumbar)  Patient Location: PACU  Anesthesia Type:General  Level of Consciousness: drowsy  Airway & Oxygen Therapy: Patient Spontanous Breathing and Patient connected to face mask oxygen  Post-op Assessment: Report given to RN and Post -op Vital signs reviewed and stable  Post vital signs: Reviewed  Last Vitals:  Vitals Value Taken Time  BP 142/81 09/03/19 1230  Temp    Pulse 80 09/03/19 1236  Resp 24 09/03/19 1236  SpO2 95 % 09/03/19 1236  Vitals shown include unvalidated device data.  Last Pain:  Vitals:   09/03/19 0948  PainSc: 5       Patients Stated Pain Goal: 3 (09/03/19 0948)  Complications: No apparent anesthesia complications. Pt moving extremities x4.

## 2019-09-03 NOTE — Anesthesia Postprocedure Evaluation (Signed)
Anesthesia Post Note  Patient: Nancy Simmons  Procedure(s) Performed: LEFT LUMBAR TWO- LUMBAR THREE MICRODISCECTOMY (Left Spine Lumbar)     Patient location during evaluation: PACU Anesthesia Type: General Level of consciousness: awake and alert Pain management: pain level controlled Vital Signs Assessment: post-procedure vital signs reviewed and stable Respiratory status: spontaneous breathing, nonlabored ventilation, respiratory function stable and patient connected to nasal cannula oxygen Cardiovascular status: blood pressure returned to baseline and stable Postop Assessment: no apparent nausea or vomiting Anesthetic complications: no    Last Vitals:  Vitals:   09/03/19 1952 09/03/19 1957  BP: (!) 156/81   Pulse: 94   Resp: 18   Temp: 36.8 C   SpO2: 98% 94%    Last Pain:  Vitals:   09/03/19 1952  TempSrc: Oral  PainSc:                  Brigitta Pricer COKER

## 2019-09-03 NOTE — H&P (Signed)
Subjective: The patient is a 64 year old obese black female with multiple medical problems on whom I performed a L2-3 discectomy in 19.  The patient initially did well but has developed recurrent left leg pain consistent with a lumbar radiculopathy.  She failed medical management and was worked up with a lumbar MRI which demonstrated a recurrent herniated disc at L2-3 on the left.  I discussed the various treatment options with her.  She has decided proceed with surgery.  Past Medical History:  Diagnosis Date  . Arthritis    "pretty much all over" (04/03/2017)  . Asthma   . Autoimmune hepatitis (HCC)   . COPD (chronic obstructive pulmonary disease) (HCC)   . Coronary artery disease    a. mild-mod by 03/2017 following false positive nuc - She subsequently underwent cath with 30% LM, 30% ostial LAD, 45% mLAD, 50% dLAD, 30% prox RCA, no flow limiting lesions, LVEF 55-65%.   . Family history of adverse reaction to anesthesia    "granddaughter has PONV"  . GERD (gastroesophageal reflux disease)   . High cholesterol   . History of blood transfusion    "in [place]; related to knee surgeries"  . History of kidney stones   . Hypertension   . OSA on CPAP    CPAP, pressure settings 11  . Type II diabetes mellitus (HCC)     Past Surgical History:  Procedure Laterality Date  . ABDOMINAL HYSTERECTOMY    . BACK SURGERY    . CARDIAC CATHETERIZATION    . CARPAL TUNNEL RELEASE Bilateral    "done in [place]"  . CHOLECYSTECTOMY    . INCISION AND DRAINAGE Right 2015   S/P knee revision  . INTRAVASCULAR PRESSURE WIRE/FFR STUDY N/A 04/05/2017   Procedure: INTRAVASCULAR PRESSURE WIRE/FFR STUDY;  Surgeon: Marykay Lex, MD;  Location: [facility] INVASIVE CV LAB;  Service: Cardiovascular;  Laterality: N/A;  . JOINT REPLACEMENT    . LEFT HEART CATH AND CORONARY ANGIOGRAPHY N/A 04/05/2017   Procedure: LEFT HEART CATH AND CORONARY ANGIOGRAPHY;  Surgeon: Marykay Lex, MD;  Location: [facility] INVASIVE CV LAB;   Service: Cardiovascular;  Laterality: N/A;  . LUMBAR LAMINECTOMY/DECOMPRESSION MICRODISCECTOMY Left 05/22/2018   Procedure: MICRODISCECTOMY LUMBAR TWOLUMBAR THREE;  Surgeon: Tressie Stalker, MD;  Location: [facility] OR;  Service: Neurosurgery;  Laterality: Left;  . POSTERIOR FUSION LUMBAR SPINE  05/2010  . REVISION TOTAL KNEE ARTHROPLASTY Right 2015  . SHOULDER ARTHROSCOPY WITH ROTATOR CUFF REPAIR Right   . SHOULDER OPEN ROTATOR CUFF REPAIR Left    "done in [place]"  . TOTAL HIP ARTHROPLASTY Left 12/10/2017   Procedure: LEFT TOTAL HIP ARTHROPLASTY ANTERIOR APPROACH;  Surgeon: Kathryne Hitch, MD;  Location: [facility] OR;  Service: Orthopedics;  Laterality: Left;  . TOTAL KNEE ARTHROPLASTY Bilateral 1990s    Allergies  Allergen Reactions  . Acetaminophen Other (See Comments)    PATIENT HAS AUTOIMMUNE HEPATITIS PATIENT IS * NOT * TO RECEIVE ANY ACETAMINOPHEN  . Penicillin G Rash and Other (See Comments)    Has patient had a PCN reaction causing immediate rash, facial/tongue/throat swelling, SOB or lightheadedness with hypotension: No Has patient had a PCN reaction causing severe rash involving mucus membranes or skin necrosis: No Has patient had a PCN reaction that required hospitalization: No Has patient had a PCN reaction occurring within the last 10 years: #  #  #  YES  #  #  #     Social History   Tobacco Use  . Smoking status: Current Every Day Smoker  Packs/day: 0.50    Years: 43.00    Pack years: 21.50    Types: Cigarettes    Start date: 07/23/1972    Last attempt to quit: 12/06/2017    Years since quitting: 1.7  . Smokeless tobacco: Never Used  Substance Use Topics  . Alcohol use: No    Alcohol/week: 0.0 standard drinks    Family History  Problem Relation Age of Onset  . CAD Mother   . Heart disease Mother        Enlarged heart  . CAD Father   . Diabetes Father   . Heart failure Father   . CAD Brother   . Diabetes Brother   . Heart disease Brother         Defibrillator  . Colon cancer Other   . Stroke Other    Prior to Admission medications   Medication Sig Start Date End Date Taking? Authorizing Provider  albuterol (PROVENTIL) (2.5 MG/3ML) 0.083% nebulizer solution Take 2.5 mg by nebulization 3 (three) times daily.    Yes [provider]  albuterol (VENTOLIN HFA) 108 (90 Base) MCG/ACT inhaler Inhale 1-2 puffs into the lungs every 6 (six) hours as needed for wheezing or shortness of breath.   Yes [provider]  amLODipine (NORVASC) 5 MG tablet Take 5 mg by mouth daily.   Yes [provider]  apixaban (ELIQUIS) 5 MG TABS tablet Take 5 mg by mouth 2 (two) times daily.   Yes [provider]  aspirin EC 81 MG tablet Take 81 mg by mouth daily.   Yes [provider]  azaTHIOprine (IMURAN) 50 MG tablet Take 100 mg by mouth daily.    Yes [provider]  buPROPion (WELLBUTRIN XL) 150 MG 24 hr tablet Take 150 mg by mouth daily.   Yes [provider]  Choline Fenofibrate (TRILIPIX) 135 MG capsule Take 135 mg by mouth every evening.    Yes [provider]  diclofenac sodium (VOLTAREN) 1 % GEL Apply 1 application topically 4 (four) times daily as needed (for pain).   Yes [provider]  fesoterodine (TOVIAZ) 8 MG TB24 tablet Take 8 mg by mouth daily.    Yes [provider]  gabapentin (NEURONTIN) 600 MG tablet Take 300 mg by mouth 3 (three) times daily.    Yes [provider]  hydrALAZINE (APRESOLINE) 10 MG tablet Take 10 mg by mouth 4 (four) times daily.   Yes [provider]  liraglutide (VICTOZA) 18 MG/3ML SOPN Inject 1.8 mg into the skin daily.   Yes [provider]  meclizine (ANTIVERT) 25 MG tablet Take 25 mg by mouth 3 (three) times daily as needed for dizziness.    Yes [provider]  metFORMIN (GLUCOPHAGE) 500 MG tablet Take 500 mg by mouth 2 (two) times daily with a meal.    Yes [provider]  morphine (MSIR)  15 MG tablet Take 15 mg by mouth 2 (two) times daily.   Yes [provider]  Multiple Vitamins-Minerals (MULTIVITAMIN GUMMIES WOMENS PO) Take 2 each by mouth daily.   Yes [provider]  pantoprazole (PROTONIX) 40 MG tablet Take 40 mg by mouth daily.   Yes [provider]  Tiotropium Bromide-Olodaterol (STIOLTO RESPIMAT IN) Inhale 2 puffs into the lungs daily.   Yes [provider]  tiZANidine (ZANAFLEX) 2 MG tablet Take 1 tablet (2 mg total) by mouth every 8 (eight) hours as needed for muscle spasms. 05/26/18  Yes Tressie Stalker, MD  Vitamin D, Ergocalciferol, (DRISDOL) 1.25 MG (50000 UNIT) CAPS capsule Take 50,000 Units by mouth every 7 (seven) days.   Yes [provider]  Oxycodone HCl 10 MG TABS Take 1 tablet (10 mg total) by mouth every 4 (four) hours as needed ((score 7 to 10)). Patient not taking: Reported on 08/31/2019 08/13/18   Mcarthur Rossetti, MD     Review of Systems  Positive ROS: As above  All other systems have been reviewed and were otherwise negative with the exception of those mentioned in the HPI and as above.  Objective: Vital signs in last 24 hours: Temp:  [98.5 F (36.9 C)] 98.5 F (36.9 C) (02/11 0936) Pulse Rate:  [90] 90 (02/11 0936) Resp:  [19] 19 (02/11 0936) BP: (168)/(75) 168/75 (02/11 0936) SpO2:  [99 %] 99 % (02/11 0936) Weight:  [129.7 kg] 129.7 kg (02/11 0936) Estimated body mass index is 52.31 kg/m as calculated from the following:   Height as of this encounter: 5\' 2"  (1.575 m).   Weight as of this encounter: 129.7 kg.   General Appearance: Alert Head: Normocephalic, without obvious abnormality, atraumatic Eyes: PERRL, conjunctiva/corneas clear, EOM's intact,    Ears: Normal  Throat: Normal  Neck: Supple, Back: His lumbar incision is well-healed. Lungs: Clear to auscultation bilaterally, respirations unlabored Heart: Regular rate and rhythm, no murmur, rub or gallop Abdomen: Soft,  non-tender Extremities: Extremities normal, atraumatic, no cyanosis or edema Skin: unremarkable  NEUROLOGIC:   Mental status: alert and oriented,Motor Exam - grossly normal, left L3 radiculopathy Sensory Exam - grossly normal Reflexes:  Coordination - grossly normal Gait - grossly normal Balance - grossly normal Cranial Nerves: I: smell Not tested  II: visual acuity  OS: Normal  OD: Normal   II: visual fields Full to confrontation  II: pupils Equal, round, reactive to light  III,VII: ptosis None  III,IV,VI: extraocular muscles  Full ROM  V: mastication Normal  V: facial light touch sensation  Normal  V,VII: corneal reflex  Present  VII: facial muscle function - upper  Normal  VII: facial muscle function - lower Normal  VIII: hearing Not tested  IX: soft palate elevation  Normal  IX,X: gag reflex Present  XI: trapezius strength  5/5  XI: sternocleidomastoid strength 5/5  XI: neck flexion strength  5/5  XII: tongue strength  Normal    Data Review Lab Results  Component Value Date   WBC 4.7 08/31/2019   HGB 11.9 (L) 08/31/2019   HCT 39.7 08/31/2019   MCV 98.3 08/31/2019   PLT 237 08/31/2019   Lab Results  Component Value Date   NA 139 08/31/2019   K 4.0 08/31/2019   CL 107 08/31/2019   CO2 22 08/31/2019   BUN 11 08/31/2019   CREATININE 0.69 08/31/2019   GLUCOSE 114 (H) 08/31/2019   Lab Results  Component Value Date   INR 1.03 05/22/2018    Assessment/Plan: Left L2-3 recurrent herniated disc, lumbago, lumbar radiculopathy:I have discussed this evaluation of the patient we have discussed the situation with the patient.  We have discussed the various treatment options including surgery.  We have described the surgical treatment option of a redo left L3 discectomy.  We have discussed the risks, benefits, alternatives, expected postoperative course, and likelihood of achieving our goals with surgery.  We have answered all her questions.  She has decided to proceed  with surgery.   [name] 09/03/2019 10:12 AM

## 2019-09-04 DIAGNOSIS — M5116 Intervertebral disc disorders with radiculopathy, lumbar region: Secondary | ICD-10-CM | POA: Diagnosis not present

## 2019-09-04 LAB — GLUCOSE, CAPILLARY
Glucose-Capillary: 108 mg/dL — ABNORMAL HIGH (ref 70–99)
Glucose-Capillary: 113 mg/dL — ABNORMAL HIGH (ref 70–99)
Glucose-Capillary: 127 mg/dL — ABNORMAL HIGH (ref 70–99)
Glucose-Capillary: 140 mg/dL — ABNORMAL HIGH (ref 70–99)
Glucose-Capillary: 144 mg/dL — ABNORMAL HIGH (ref 70–99)

## 2019-09-04 LAB — SARS CORONAVIRUS 2 (TAT 6-24 HRS): SARS Coronavirus 2: NEGATIVE

## 2019-09-04 MED ORDER — DOCUSATE SODIUM 100 MG PO CAPS: 100.0000 mg | ORAL_CAPSULE | Freq: Two times a day (BID) | ORAL | 0 refills | Status: DC

## 2019-09-04 MED ORDER — ASPIRIN EC 81 MG PO TBEC: 81.0000 mg | DELAYED_RELEASE_TABLET | Freq: Every day | ORAL | 0 refills | Status: DC

## 2019-09-04 MED ORDER — APIXABAN 5 MG PO TABS: 5.0000 mg | ORAL_TABLET | Freq: Two times a day (BID) | ORAL | 0 refills | Status: AC

## 2019-09-04 MED ORDER — INSULIN ASPART 100 UNIT/ML ~~LOC~~ SOLN: 0.0000 [IU] | Freq: Three times a day (TID) | SUBCUTANEOUS | Status: DC

## 2019-09-04 NOTE — Discharge Summary (Signed)
Physician Discharge Summary  Patient ID: Nancy Simmons MRN: [id number] DOB/AGE: 1956-04-16 64 y.o.  Admit date: 09/03/2019 Discharge date: 09/04/2019  Admission Diagnoses: Lumbar herniated disc  Discharge Diagnoses:  Active Problems:   Lumbar herniated disc   Discharged Condition: good  Hospital Course: Patient was admitted to 3C10 following redo left L2-3 intervertebral discectomy using microdissection by Dr. Arnoldo Morale. She is doing well post operatively. She has ambulated in the hall with therapies. Her pain is well-controlled with her current medication regimen. She is ready to discharge to a skilled nursing facility.  Consults: rehabilitation medicine  Significant Diagnostic Studies: radiology: DG Lumbar Spine 1 View  Result Date: 09/03/2019 CLINICAL DATA:  Peri op lumbar spine EXAM: LUMBAR SPINE - 1 VIEW COMPARISON:  Lumbar spine radiographs 07/28/2019 FINDINGS: Single lateral intraoperative projection demonstrates surgical probe at the posterior elements of L2. IMPRESSION: Surgical probe at the posterior elements of L2. Electronically Signed   By: Audie Pinto M.D.   On: 09/03/2019 12:35     Treatments: surgery: Redo left L2-3 intervertebral discectomy using micro-dissection  Discharge Exam: Blood pressure (!) 148/81, pulse 87, temperature 98.3 F (36.8 C), temperature source Oral, resp. rate 16, height 5\' 2"  (1.575 m), weight 129.7 kg, SpO2 100 %.   Alert and oriented x 4 PERRLA CN II-XII grossly intact MAE, Strength and sensation intact Incision is covered with Honeycomb dressing and Steri Strips; Dressing is dry and intact with a small amount of dry sanguinous drainage  Disposition: Discharge disposition: 03-Skilled Nursing Facility        Allergies as of 09/04/2019      Reactions   Acetaminophen Other (See Comments)   PATIENT HAS AUTOIMMUNE HEPATITIS PATIENT IS * NOT * TO RECEIVE ANY ACETAMINOPHEN   Penicillin G Rash, Other (See Comments)   Has patient had  a PCN reaction causing immediate rash, facial/tongue/throat swelling, SOB or lightheadedness with hypotension: No Has patient had a PCN reaction causing severe rash involving mucus membranes or skin necrosis: No Has patient had a PCN reaction that required hospitalization: No Has patient had a PCN reaction occurring within the last 10 years: #  #  #  YES  #  #  #      Medication List    TAKE these medications   albuterol (2.5 MG/3ML) 0.083% nebulizer solution Commonly known as: PROVENTIL Take 2.5 mg by nebulization 3 (three) times daily.   albuterol 108 (90 Base) MCG/ACT inhaler Commonly known as: VENTOLIN HFA Inhale 1-2 puffs into the lungs every 6 (six) hours as needed for wheezing or shortness of breath.   amLODipine 5 MG tablet Commonly known as: NORVASC Take 5 mg by mouth daily.   apixaban 5 MG Tabs tablet Commonly known as: Eliquis Take 1 tablet (5 mg total) by mouth 2 (two) times daily. Restart on 09/07/2019 What changed: additional instructions   aspirin EC 81 MG tablet Take 1 tablet (81 mg total) by mouth daily. Restart on 09/07/2019 What changed: additional instructions   azaTHIOprine 50 MG tablet Commonly known as: IMURAN Take 100 mg by mouth daily.   buPROPion 150 MG 24 hr tablet Commonly known as: WELLBUTRIN XL Take 150 mg by mouth daily.   diclofenac sodium 1 % Gel Commonly known as: VOLTAREN Apply 1 application topically 4 (four) times daily as needed (for pain).   docusate sodium 100 MG capsule Commonly known as: COLACE Take 1 capsule (100 mg total) by mouth 2 (two) times daily.   fesoterodine 8 MG Tb24 tablet Commonly  known as: TOVIAZ Take 8 mg by mouth daily.   gabapentin 600 MG tablet Commonly known as: NEURONTIN Take 300 mg by mouth 3 (three) times daily.   hydrALAZINE 10 MG tablet Commonly known as: APRESOLINE Take 10 mg by mouth 4 (four) times daily.   liraglutide 18 MG/3ML Sopn Commonly known as: VICTOZA Inject 1.8 mg into the skin  daily.   meclizine 25 MG tablet Commonly known as: ANTIVERT Take 25 mg by mouth 3 (three) times daily as needed for dizziness.   metFORMIN 500 MG tablet Commonly known as: GLUCOPHAGE Take 500 mg by mouth 2 (two) times daily with a meal.   morphine 15 MG tablet Commonly known as: MSIR Take 15 mg by mouth 2 (two) times daily.   MULTIVITAMIN GUMMIES WOMENS PO Take 2 each by mouth daily.   Oxycodone HCl 10 MG Tabs Take 1 tablet (10 mg total) by mouth every 4 (four) hours as needed ((score 7 to 10)).   pantoprazole 40 MG tablet Commonly known as: PROTONIX Take 40 mg by mouth daily.   STIOLTO RESPIMAT IN Inhale 2 puffs into the lungs daily.   tiZANidine 2 MG tablet Commonly known as: ZANAFLEX Take 1 tablet (2 mg total) by mouth every 8 (eight) hours as needed for muscle spasms.   Trilipix 135 MG capsule Generic drug: Choline Fenofibrate Take 135 mg by mouth every evening.   Vitamin D (Ergocalciferol) 1.25 MG (50000 UNIT) Caps capsule Commonly known as: DRISDOL Take 50,000 Units by mouth every 7 (seven) days.      Follow-up Information    Tressie Stalker, MD. Schedule an appointment as soon as possible for a visit in 2 week(s).   Specialty: Neurosurgery Contact information: [address] [place] [address] [phone]           Signed: Floreen Comber 09/04/2019, 3:05 PM

## 2019-09-04 NOTE — Progress Notes (Signed)
Patient's Covid result is negative. [facility] called for transport ans report given to receiving nurse Gerre Scull, RN with all questions answered. Oncoming nurse with discharge patient.

## 2019-09-04 NOTE — Progress Notes (Signed)
   Providing Compassionate, Quality Care - Together   Subjective: Patient reports no issues overnight. Her pre-operative radicular symptoms have resolved. She reports expected post-operative pain in her back. Patient would like to discharge to a SNF.  Objective: Vital signs in last 24 hours: Temp:  [97.5 F (36.4 C)-98.7 F (37.1 C)] 98.4 F (36.9 C) (02/12 0741) Pulse Rate:  [81-94] 82 (02/12 0741) Resp:  [12-23] 16 (02/12 0741) BP: (122-163)/(67-98) 123/69 (02/12 0741) SpO2:  [92 %-99 %] 97 % (02/12 0741)  Intake/Output from previous day: 02/11 0701 - 02/12 0700 In: 305 [IV Piggyback:305] Out: 26 [Urine:1; Blood:25] Intake/Output this shift: No intake/output data recorded.  Alert and oriented x 4 PERRLA CN II-XII grossly intact MAE, Strength and sensation intact Incision is covered with Honeycomb dressing and Steri Strips; Dressing is dry and intact with a small amount of dry sanguinous drainage   Lab Results: No results for input(s): WBC, HGB, HCT, PLT in the last 72 hours. BMET No results for input(s): NA, K, CL, CO2, GLUCOSE, BUN, CREATININE, CALCIUM in the last 72 hours.  Studies/Results: DG Lumbar Spine 1 View  Result Date: 09/03/2019 CLINICAL DATA:  Peri op lumbar spine EXAM: LUMBAR SPINE - 1 VIEW COMPARISON:  Lumbar spine radiographs 07/28/2019 FINDINGS: Single lateral intraoperative projection demonstrates surgical probe at the posterior elements of L2. IMPRESSION: Surgical probe at the posterior elements of L2. Electronically Signed   By: Emmaline Kluver M.D.   On: 09/03/2019 12:35    Assessment/Plan: Patient is one day status post redo left L2-3 intervertebral discectomy using microdissection by Dr. Lovell Sheehan. She is doing well post operatively. She has ambulated in the hall with physical therapy.   LOS: 0 days    -Discharge pending PT/OT recommendations -CSW working on resolving patient's issues with home hospital bed   Val Eagle, DNP,  AGNP-C Nurse Practitioner  [facility] [address], [place], [place] [address] P: [phone]    F: [phone]  09/04/2019, 10:31 AM

## 2019-09-04 NOTE — Discharge Instructions (Signed)
*  Restart Eliquis and aspirin on Monday, 09/07/2019*   Wound Care Leave incision open to air. You may shower. Do not scrub directly on incision.  Do not put any creams, lotions, or ointments on incision. Activity Walk each and every day, increasing distance each day. No lifting greater than 5 lbs.  Avoid bending, arching, and twisting. No driving for 2 weeks; may ride as a passenger locally. If provided with back brace, wear when out of bed.  It is not necessary to wear in bed. Diet Resume your normal diet.  Return to Work Will be discussed at you follow up appointment. Call Your Doctor If Any of These Occur Redness, drainage, or swelling at the wound.  Temperature greater than 101 degrees. Severe pain not relieved by pain medication. Incision starts to come apart. Follow Up Appt Call today for appointment in 2-4 weeks ([phone]) or for problems.  If you have any hardware placed in your spine, you will need an x-ray before your appointment.

## 2019-09-04 NOTE — Evaluation (Signed)
Occupational Therapy Evaluation Patient Details Name: Nancy Simmons MRN: [id number] DOB: Jul 29, 1955 Today's Date: 09/04/2019    History of Present Illness 64 yo female s/p redo left L2-3 intervertebral discectomy.  has a past medical history of Arthritis, Asthma, Autoimmune hepatitis, COPD, CAD, Family history of adverse reaction to anesthesia, GERD, High cholesterol, HTN, OSA on CPAP, and Type II DM.   Clinical Impression   PTA, pt was living with alone and was independent with BADLs and using rollator for mobility. Currently, pt performing ADLs and functional mobility at [place] level. Provided education and handout on bed mobility, back precautions, LB ADLs with AE, toileting, and functional transafers; pt demonstrated understanding. Answered all pt questions. Recommend dc home once medically stable per physician. All acute OT needs met and will sign off. Thank you.    Follow Up Recommendations  No OT follow up;Supervision/Assistance - 24 hour    Equipment Recommendations  Hospital bed    Recommendations for Other Services PT consult     Precautions / Restrictions Precautions Precautions: Back Precaution Booklet Issued: Yes (comment) Precaution Comments: Reviewed all back precautions and educated on compensatory techniques for ADLs Restrictions Weight Bearing Restrictions: No      Mobility Bed Mobility Overal bed mobility: Needs Assistance Bed Mobility: Rolling;Sidelying to Sit Rolling: Supervision Sidelying to sit: Supervision       General bed mobility comments: Educated on log roll technique. Supervision for safety. Use of bed rails  Transfers Overall transfer level: Needs assistance Equipment used: None Transfers: Sit to/from Stand Sit to Stand: Min guard         General transfer comment: Min Guard A for safety    Balance Overall balance assessment: Mild deficits observed, not formally tested                                          ADL either performed or assessed with clinical judgement   ADL Overall ADL's : Needs assistance/impaired Eating/Feeding: Independent;Sitting   Grooming: Oral care;Supervision/safety;Set up;Standing Grooming Details (indicate cue type and reason): Educated on compensatory techniques Upper Body Bathing: Set up;Supervision/ safety;Sitting   Lower Body Bathing: Min guard;Sit to/from stand   Upper Body Dressing : Supervision/safety;Set up;Sitting Upper Body Dressing Details (indicate cue type and reason): Pt donning shirt while seated at EOB Lower Body Dressing: Min guard;With adaptive equipment;Sit to/from stand Lower Body Dressing Details (indicate cue type and reason): Pt donning socks, underwear, and pants with AE and Min Guard A for safety in standing Toilet Transfer: Min guard;Ambulation(simulated in room) Toilet Transfer Details (indicate cue type and reason): Min Guard A for safety          Functional mobility during ADLs: Min guard General ADL Comments: Providing pt with education and handout on back precautions, bed mobility, LB ADLs with AE, toileting,      Vision         Perception     Praxis      Pertinent Vitals/Pain Pain Assessment: 0-10 Pain Score: 8  Pain Location: Back Pain Descriptors / Indicators: Discomfort;Grimacing Pain Intervention(s): Limited activity within patient's tolerance;Monitored during session;Repositioned     Hand Dominance Right   Extremity/Trunk Assessment Upper Extremity Assessment Upper Extremity Assessment: Overall WFL for tasks assessed   Lower Extremity Assessment Lower Extremity Assessment: Defer to PT evaluation   Cervical / Trunk Assessment Cervical / Trunk Assessment: Other exceptions Cervical / Trunk  Exceptions: s/p lumbar sx. increased body habitus   Communication Communication Communication: No difficulties   Cognition Arousal/Alertness: Awake/alert Behavior During Therapy: WFL for tasks  assessed/performed Overall Cognitive Status: Within Functional Limits for tasks assessed                                     General Comments       Exercises     Shoulder Instructions      Home Living Family/patient expects to be discharged to:: Private residence Living Arrangements: Alone Available Help at Discharge: Family;Available PRN/intermittently Type of Home: Apartment Home Access: Stairs to enter Entrance Stairs-Number of Steps: 5 Entrance Stairs-Rails: Right;Left;Can reach both Home Layout: One level     Bathroom Shower/Tub: Teacher, early years/pre: Standard(BSC over)     Home Equipment: Tub bench;Bedside commode;Walker - 4 wheels          Prior Functioning/Environment Level of Independence: Independent with assistive device(s)        Comments: Pt reporting she performs ADLs with AE (including sock aide, reacher, and toilet aide) and IADLs at home. Uses rollator for functional mobility.         OT Problem List: Decreased strength;Decreased range of motion;Decreased activity tolerance;Impaired balance (sitting and/or standing);Decreased knowledge of use of DME or AE;Decreased knowledge of precautions      OT Treatment/Interventions: Self-care/ADL training    OT Goals(Current goals can be found in the care plan section) Acute Rehab OT Goals Patient Stated Goal: Get better and go home OT Goal Formulation: With patient Time For Goal Achievement: 09/18/19 Potential to Achieve Goals: Good  OT Frequency:     Barriers to D/C:            Co-evaluation              AM-PAC OT "6 Clicks" Daily Activity     Outcome Measure Help from another person eating meals?: None Help from another person taking care of personal grooming?: A Little Help from another person toileting, which includes using toliet, bedpan, or urinal?: A Little Help from another person bathing (including washing, rinsing, drying)?: A Little Help from  another person to put on and taking off regular upper body clothing?: A Little Help from another person to put on and taking off regular lower body clothing?: A Little 6 Click Score: 19   End of Session Equipment Utilized During Treatment: Gait belt Nurse Communication: Mobility status  Activity Tolerance: Patient tolerated treatment well Patient left: in chair;with call bell/phone within reach  OT Visit Diagnosis: Unsteadiness on feet (R26.81);Other abnormalities of gait and mobility (R26.89);Muscle weakness (generalized) (M62.81);Pain                Time: 7654-6503 OT Time Calculation (min): 27 min Charges:  OT General Charges $OT Visit: 1 Visit OT Evaluation $OT Eval Low Complexity: 1 Low OT Treatments $Self Care/Home Management : 8-22 mins  Mee Macdonnell MSOT, OTR/L Acute Rehab Pager: [phone] Office: [place] 09/04/2019, 10:58 AM

## 2019-09-04 NOTE — NC FL2 (Signed)
[place] MEDICAID [id number] LEVEL OF CARE SCREENING TOOL     IDENTIFICATION  Patient Name: Nancy Simmons Birthdate: 25-Jul-1955 Sex: female Admission Date (Current Location): 09/03/2019  [facility] and [place] Number:  [employer] and Address:  [facility] [place], [address], [place], [place] [address]      Provider Number: [id number]  Attending Physician Name and Address:  Tressie Stalker, MD  Relative Name and Phone Number:  Glee Arvin, daughter, [phone]    Current Level of Care: Hospital Recommended Level of Care: Skilled Nursing Facility Prior Approval Number:    Date Approved/Denied:   PASRR Number: [id number]  Discharge Plan: SNF    Current Diagnoses: Patient Active Problem List   Diagnosis Date Noted  . Chest pain, unspecified 06/20/2018  . CAD in native artery 06/20/2018  . Morbid obesity (HCC) 06/20/2018  . History of DVT (deep vein thrombosis) 06/20/2018  . Chest pain 06/20/2018  . Lumbar herniated disc 05/22/2018  . Status post left hip replacement 12/10/2017  . Unilateral primary osteoarthritis, left hip 11/04/2017  . Abnormal stress echocardiography   . Stable angina (HCC) 04/03/2017  . DM2 (diabetes mellitus, type 2) (HCC) 04/03/2017  . Asthma 04/03/2017  . OSA on CPAP 04/03/2017  . Angina at rest [facility]) 04/03/2017  . Medication monitoring encounter 07/09/2016  . Autoimmune hepatitis (HCC) 07/09/2016  . 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 HTN (hypertension) 09/27/2015  . CAD (coronary artery disease) of artery bypass graft 09/27/2015  . Hypercholesterolemia 09/27/2015  . S/P cholecystectomy 09/27/2015    Orientation RESPIRATION BLADDER Height & Weight     Self, Time, Situation, Place  Normal, Other (Comment)(Home CPAP at night) Continent Weight: 286 lb (129.7 kg) Height:  5\' 2"  (157.5 cm)  BEHAVIORAL SYMPTOMS/MOOD NEUROLOGICAL BOWEL NUTRITION STATUS      Continent Diet(Please see DC Summary)   AMBULATORY STATUS COMMUNICATION OF NEEDS Skin   Independent Verbally Surgical wounds                       Personal Care Assistance Level of Assistance  Bathing, Dressing, Feeding Bathing Assistance: Limited assistance Feeding assistance: Independent Dressing Assistance: Independent     Functional Limitations Info  Sight, Hearing, Speech Sight Info: Adequate Hearing Info: Adequate Speech Info: Adequate    SPECIAL CARE FACTORS FREQUENCY  PT (By licensed PT)     PT Frequency: 2x              Contractures Contractures Info: Not present    Additional Factors Info  Code Status, Allergies, Insulin Sliding Scale Code Status Info: Full Allergies Info: Acetaminophen, Penicillin G   Insulin Sliding Scale Info: See DC Summary       Current Medications (09/04/2019):  This is the current hospital active medication list Current Facility-Administered Medications  Medication Dose Route Frequency Provider Last Rate Last Admin  . 0.9 %  sodium chloride infusion  250 mL Intravenous Continuous 11/02/2019, MD   Stopped at 09/04/19 [phone]  . albuterol (PROVENTIL) (2.5 MG/3ML) 0.083% nebulizer solution 2.5 mg  2.5 mg Nebulization Q6H PRN 9323, MD      . amLODipine (NORVASC) tablet 5 mg  5 mg Oral Daily Tressie Stalker, MD   5 mg at 09/04/19 0950  . azaTHIOprine (IMURAN) tablet 100 mg  100 mg Oral Daily 11/02/19, MD   100 mg at 09/04/19 0949  . bisacodyl (DULCOLAX) suppository 10 mg  10 mg Rectal Daily PRN  Newman Pies, MD      . buPROPion (WELLBUTRIN XL) 24 hr tablet 150 mg  150 mg Oral Daily Newman Pies, MD   150 mg at 09/04/19 0949  . docusate sodium (COLACE) capsule 100 mg  100 mg Oral BID Newman Pies, MD   100 mg at 09/04/19 0950  . fenofibrate tablet 160 mg  160 mg Oral Daily Newman Pies, MD   160 mg at 09/04/19 0949  . fesoterodine (TOVIAZ) tablet 8 mg  8 mg Oral Daily Newman Pies, MD   8 mg at 09/04/19 0949  . gabapentin  (NEURONTIN) tablet 300 mg  300 mg Oral TID Newman Pies, MD   300 mg at 09/04/19 0949  . hydrALAZINE (APRESOLINE) tablet 10 mg  10 mg Oral QID Newman Pies, MD   10 mg at 09/04/19 0950  . insulin aspart (novoLOG) injection 0-20 Units  0-20 Units Subcutaneous Q4H Newman Pies, MD   3 Units at 09/04/19 1239  . lactated ringers infusion   Intravenous Continuous Newman Pies, MD   Stopped at 09/03/19 1920  . meclizine (ANTIVERT) tablet 25 mg  25 mg Oral TID PRN Newman Pies, MD      . menthol-cetylpyridinium (CEPACOL) lozenge 3 mg  1 lozenge Oral PRN Newman Pies, MD       Or  . phenol [facility]) mouth spray 1 spray  1 spray Mouth/Throat PRN Newman Pies, MD   1 spray at 09/04/19 1035  . metFORMIN (GLUCOPHAGE) tablet 500 mg  500 mg Oral BID WC Newman Pies, MD   500 mg at 09/04/19 0758  . morphine (MS CONTIN) 12 hr tablet 15 mg  15 mg Oral Q12H Newman Pies, MD   15 mg at 09/04/19 0950  . morphine 4 MG/ML injection 4 mg  4 mg Intravenous Q2H PRN Newman Pies, MD      . ondansetron [facility]) tablet 4 mg  4 mg Oral Q6H PRN Newman Pies, MD       Or  . ondansetron [facility]) injection 4 mg  4 mg Intravenous Q6H PRN Newman Pies, MD      . oxyCODONE (Oxy IR/ROXICODONE) immediate release tablet 10 mg  10 mg Oral Q3H PRN Newman Pies, MD   10 mg at 09/04/19 0759  . oxyCODONE (Oxy IR/ROXICODONE) immediate release tablet 5 mg  5 mg Oral Q3H PRN Newman Pies, MD      . pantoprazole (PROTONIX) EC tablet 40 mg  40 mg Oral Daily Newman Pies, MD   40 mg at 09/04/19 0950  . sodium chloride flush (NS) 0.9 % injection 3 mL  3 mL Intravenous Q12H Newman Pies, MD   3 mL at 09/03/19 2244  . sodium chloride flush (NS) 0.9 % injection 3 mL  3 mL Intravenous PRN Newman Pies, MD      . tiZANidine (ZANAFLEX) tablet 2 mg  2 mg Oral Q8H PRN Newman Pies, MD   2 mg at 09/04/19 0758  . umeclidinium-vilanterol (ANORO ELLIPTA) 62.5-25 MCG/INH 1 puff  1  puff Inhalation Daily Newman Pies, MD   1 puff at 09/04/19 0736  . zolpidem (AMBIEN) tablet 5 mg  5 mg Oral QHS PRN Newman Pies, MD         Discharge Medications: Please see discharge summary for a list of discharge medications.  Relevant Imaging Results:  Relevant Lab Results:   Additional Information SS#: [id number]             COVID negative 08/31/19  Benard Halsted, LCSW

## 2019-09-04 NOTE — Evaluation (Signed)
Physical Therapy Evaluation and Discharge Patient Details Name: Nancy Simmons MRN: [id number] DOB: November 15, 1955 Today's Date: 09/04/2019   History of Present Illness  64 yo female s/p redo left L2-3 intervertebral discectomy.  has a past medical history of Arthritis, Asthma, Autoimmune hepatitis, COPD, CAD, Family history of adverse reaction to anesthesia, GERD, High cholesterol, HTN, OSA on CPAP, and Type II DM.  Clinical Impression  Patient evaluated by Physical Therapy with no further acute PT needs identified. All education has been completed and the patient has no further questions. Pt was able to demonstrate transfers and ambulation with gross modified independence and no AD. Tolerance for functional activity is low however pt was able to ambulate ~200 feet with occasional rest breaks. Pt reporting she cannot return home unless she has a hospital bed as the one she has is broken, and pt is requesting SNF placement as she lives alone. I do feel like pt would be able to manage safely at home if she is not able to d/c to SNF. We discussed using pillows/wedges/blankets to position herself in a more upright posture to sleep if she did return home without a new hospital bed. Pt reports that between her granddaughter and a neighbor she would have at least intermittent assistance available. Pt was educated on precautions, appropriate activity progression, and car transfer. See below for any follow-up Physical Therapy or equipment needs. PT is signing off. Thank you for this referral.      Follow-Up PT Recommendations No PT follow up for skilled services however pt requesting SNF placement; Intermittent Supervision    Equipment Recommendations  Hospital bed(Per pt request)    Recommendations for Other Services       Precautions / Restrictions Precautions Precautions: Back Precaution Booklet Issued: Yes (comment) Precaution Comments: Reviewed precautions during functional mobility.   Restrictions Weight Bearing Restrictions: No      Mobility  Bed Mobility Overal bed mobility: Modified Independent Bed Mobility: Rolling;Sit to Sidelying           General bed mobility comments: Use of bed rails but HOB flat to simulate home environment. No assist for pt to transition back to supine.   Transfers Overall transfer level: Modified independent Equipment used: None Transfers: Sit to/from Stand           General transfer comment: Pt was able to stand without assistance from recliner and from toilet. VC's for maintenance of precautions but overall performed well.   Ambulation/Gait Ambulation/Gait assistance: Modified independent (Device/Increase time) Gait Distance (Feet): 200 Feet Assistive device: None Gait Pattern/deviations: Step-through pattern;Decreased stride length;Trendelenburg;Wide base of support Gait velocity: Decreased Gait velocity interpretation: <1.8 ft/sec, indicate of risk for recurrent falls General Gait Details: Pt ambulating well and self-monitoring for rest breaks. Trendelenberg gait pattern noted however no unsteadiness or LOB.   Stairs Stairs: Yes Stairs assistance: Supervision Stair Management: Two rails;Alternating pattern;Forwards Number of Stairs: 5 General stair comments: Pt demonstrated good alternating step pattern without assistance. No unsteadiness or LOB noted.   Wheelchair Mobility    Modified Rankin (Stroke Patients Only)       Balance Overall balance assessment: Mild deficits observed, not formally tested                                           Pertinent Vitals/Pain Pain Assessment: Faces Faces Pain Scale: Hurts little more Pain Location: Back Pain Descriptors /  Indicators: Discomfort;Grimacing Pain Intervention(s): Limited activity within patient's tolerance;Monitored during session;Repositioned    Home Living Family/patient expects to be discharged to:: Private residence Living  Arrangements: Alone Available Help at Discharge: Family;Available PRN/intermittently Type of Home: Apartment Home Access: Stairs to enter Entrance Stairs-Rails: Right;Left;Can reach both Entrance Stairs-Number of Steps: 5 Home Layout: One level Home Equipment: Tub bench;Bedside commode;Walker - 4 wheels Additional Comments: Has hospital bed and lift chair at home however reports that both are broken and no longer automatically move.    Prior Function Level of Independence: Independent with assistive device(s)         Comments: Pt reporting she performs ADLs with AE (including sock aide, reacher, and toilet aide) and IADLs at home. Uses rollator for functional mobility.      Hand Dominance   Dominant Hand: Right    Extremity/Trunk Assessment   Upper Extremity Assessment Upper Extremity Assessment: Defer to OT evaluation    Lower Extremity Assessment Lower Extremity Assessment: Overall WFL for tasks assessed(Decreased muscular endurance but strength WFL)    Cervical / Trunk Assessment Cervical / Trunk Assessment: Other exceptions Cervical / Trunk Exceptions: s/p lumbar sx; increased body habitus  Communication   Communication: No difficulties  Cognition Arousal/Alertness: Awake/alert Behavior During Therapy: WFL for tasks assessed/performed Overall Cognitive Status: Within Functional Limits for tasks assessed                                        General Comments      Exercises     Assessment/Plan    PT Assessment Patent does not need any further PT services  PT Problem List         PT Treatment Interventions      PT Goals (Current goals can be found in the Care Plan section)  Acute Rehab PT Goals Patient Stated Goal: Get better and go home PT Goal Formulation: All assessment and education complete, DC therapy    Frequency     Barriers to discharge        Co-evaluation               AM-PAC PT "6 Clicks" Mobility  Outcome  Measure Help needed turning from your back to your side while in a flat bed without using bedrails?: None Help needed moving from lying on your back to sitting on the side of a flat bed without using bedrails?: None Help needed moving to and from a bed to a chair (including a wheelchair)?: None Help needed standing up from a chair using your arms (e.g., wheelchair or bedside chair)?: None Help needed to walk in hospital room?: None Help needed climbing 3-5 steps with a railing? : A Little 6 Click Score: 23    End of Session Equipment Utilized During Treatment: Gait belt Activity Tolerance: Patient tolerated treatment well Patient left: in bed;with call bell/phone within reach Nurse Communication: Mobility status PT Visit Diagnosis: Pain;Other abnormalities of gait and mobility (R26.89) Pain - part of body: (back)    Time: 6237-6283 PT Time Calculation (min) (ACUTE ONLY): 29 min   Charges:   PT Evaluation $PT Eval Low Complexity: 1 Low PT Treatments $Gait Training: 8-22 mins        Conni Slipper, PT, DPT Acute Rehabilitation Services Pager: [phone] Office: [phone]   Marylynn Pearson 09/04/2019, 2:02 PM

## 2019-09-04 NOTE — Progress Notes (Signed)
Patient discharged to SNF  At this time with [facility] for transport. Patient with no noted complaints nor s/s of any distress at the time of discharge. Personal belongings sent with patient.

## 2019-09-04 NOTE — Progress Notes (Signed)
Subjective: The patient is alert and pleasant.  She is in no apparent distress.  Objective: Vital signs in last 24 hours: Temp:  [97.5 F (36.4 C)-98.7 F (37.1 C)] 98.4 F (36.9 C) (02/12 0741) Pulse Rate:  [81-94] 82 (02/12 0741) Resp:  [12-23] 16 (02/12 0741) BP: (122-163)/(67-98) 123/69 (02/12 0741) SpO2:  [92 %-99 %] 97 % (02/12 0741) Estimated body mass index is 52.31 kg/m as calculated from the following:   Height as of this encounter: 5\' 2"  (1.575 m).   Weight as of this encounter: 129.7 kg.   Intake/Output from previous day: 02/11 0701 - 02/12 0700 In: 305 [IV Piggyback:305] Out: 26 [Urine:1; Blood:25] Intake/Output this shift: No intake/output data recorded.  Physical exam the patient is alert and pleasant.  She is moving her lower extremities well.  Lab Results: No results for input(s): WBC, HGB, HCT, PLT in the last 72 hours. BMET No results for input(s): NA, K, CL, CO2, GLUCOSE, BUN, CREATININE, CALCIUM in the last 72 hours.  Studies/Results: DG Lumbar Spine 1 View  Result Date: 09/03/2019 CLINICAL DATA:  Peri op lumbar spine EXAM: LUMBAR SPINE - 1 VIEW COMPARISON:  Lumbar spine radiographs 07/28/2019 FINDINGS: Single lateral intraoperative projection demonstrates surgical probe at the posterior elements of L2. IMPRESSION: Surgical probe at the posterior elements of L2. Electronically Signed   By: 09/25/2019 M.D.   On: 09/03/2019 12:35    Assessment/Plan: Postop day #1: We are trying to make arrangements for her to be discharged to home.  LOS: 0 days     11/01/2019 09/04/2019, 12:01 PM

## 2019-09-04 NOTE — TOC Initial Note (Signed)
Transition of Care [facility]) - Initial/Assessment Note    Patient Details  Name: Nancy Simmons MRN: [id number] Date of Birth: 1956-06-06  Transition of Care [facility]) CM/SW Contact:    Mearl Latin, LCSW Phone Number: 09/04/2019, 10:19 AM  Clinical Narrative:                 CSW received consult regarding hospital bed for patient. Patient reports that she has a hospital bed from [facility] Patient but that it moves slowly and to fix it she would have to pay $300. [employer] reviewing insurance to see if they are able to get patient another bed.  360 referral made for transportation needs as patient's car is broken. Patient is independent and from home alone; will return home at discharge.   Expected Discharge Plan: Home/Self Care Barriers to Discharge: No Barriers Identified   Patient Goals and CMS Choice Patient states their goals for this hospitalization and ongoing recovery are:: Return home      Expected Discharge Plan and Services Expected Discharge Plan: Home/Self Care   Discharge Planning Services: CM Consult Post Acute Care Choice: Durable Medical Equipment Living arrangements for the past 2 months: Single Family Home                                      Prior Living Arrangements/Services Living arrangements for the past 2 months: Single Family Home Lives with:: Self Patient language and need for interpreter reviewed:: Yes Do you feel safe going back to the place where you live?: Yes      Need for Family Participation in Patient Care: No (Comment) Care giver support system in place?: Yes (comment) Current home services: DME Criminal Activity/Legal Involvement Pertinent to Current Situation/Hospitalization: No - Comment as needed  Activities of Daily Living Home Assistive Devices/Equipment: CBG Meter, Walker (specify type), Blood pressure cuff, Eyeglasses ADL Screening (condition at time of admission) Patient's cognitive ability adequate to safely complete daily  activities?: Yes Is the patient deaf or have difficulty hearing?: No Does the patient have difficulty seeing, even when wearing glasses/contacts?: No Does the patient have difficulty concentrating, remembering, or making decisions?: No Patient able to express need for assistance with ADLs?: Yes Does the patient have difficulty dressing or bathing?: Yes Independently performs ADLs?: No Communication: Independent Dressing (OT): Needs assistance Is this a change from baseline?: Change from baseline, expected to last <3days Grooming: Independent Feeding: Independent Bathing: Needs assistance Is this a change from baseline?: Change from baseline, expected to last <3 days Toileting: Needs assistance Is this a change from baseline?: Change from baseline, expected to last <3 days In/Out Bed: Needs assistance Is this a change from baseline?: Change from baseline, expected to last <3 days Does the patient have difficulty walking or climbing stairs?: Yes Weakness of Legs: Both Weakness of Arms/Hands: None  Permission Sought/Granted                  Emotional Assessment Appearance:: Appears stated age Attitude/Demeanor/Rapport: Engaged Affect (typically observed): Accepting, Appropriate Orientation: : Oriented to Self, Oriented to Place, Oriented to  Time, Oriented to Situation Alcohol / Substance Use: Not Applicable Psych Involvement: No (comment)  Admission diagnosis:  Lumbar herniated disc [M51.26] Patient Active Problem List   Diagnosis Date Noted  . Chest pain, unspecified 06/20/2018  . CAD in native artery 06/20/2018  . Morbid obesity (HCC) 06/20/2018  . History of DVT (deep  vein thrombosis) 06/20/2018  . Chest pain 06/20/2018  . Lumbar herniated disc 05/22/2018  . Status post left hip replacement 12/10/2017  . Unilateral primary osteoarthritis, left hip 11/04/2017  . Abnormal stress echocardiography   . Stable angina ([place]) 04/03/2017  . DM2 (diabetes mellitus, type 2)  ([place]) 04/03/2017  . Asthma 04/03/2017  . OSA on CPAP 04/03/2017  . Angina at rest [facility]) 04/03/2017  . Medication monitoring encounter 07/09/2016  . Autoimmune hepatitis ([place]) 07/09/2016  . Infection of prosthetic right knee joint ([place]) 09/27/2015  . HTN (hypertension) 09/27/2015  . CAD (coronary artery disease) of artery bypass graft 09/27/2015  . Hypercholesterolemia 09/27/2015  . S/P cholecystectomy 09/27/2015   PCP:  Helen Hashimoto., MD Pharmacy:   [facility], [place] [address] Phone: [phone] Fax: [phone]     Social Determinants of Health (SDOH) Interventions    Readmission Risk Interventions No flowsheet data found.

## 2019-09-04 NOTE — TOC Progression Note (Signed)
Transition of Care [facility]) - Progression Note    Patient Details  Name: Nancy Simmons MRN: [id number] Date of Birth: Aug 15, 1955  Transition of Care [facility]) CM/SW Contact  Mearl Latin, LCSW Phone Number: 09/04/2019, 2:04 PM  Clinical Narrative:    Patient requesting to go to [facility]. Per [facility] Admissions, they are able to accept patient today once COVID results return. They can accept patient any time as long as DC Summary is done and FL2 signed. [facility] can be called for transport.   Expected Discharge Plan: Skilled Nursing Facility Barriers to Discharge: No Barriers Identified  Expected Discharge Plan and Services Expected Discharge Plan: Skilled Nursing Facility   Discharge Planning Services: CM Consult Post Acute Care Choice: Durable Medical Equipment Living arrangements for the past 2 months: Single Family Home                                       Social Determinants of Health (SDOH) Interventions    Readmission Risk Interventions No flowsheet data found.

## 2019-09-04 NOTE — TOC Transition Note (Signed)
Transition of Care [facility]) - CM/SW Discharge Note   Patient Details  Name: Nancy Simmons MRN: [id number] Date of Birth: [birth year]  Transition of Care [facility]) CM/SW Contact:  Mearl Latin, LCSW Phone Number: 09/04/2019, 4:33 PM   Clinical Narrative:    If patient's COVID comes back negative, please have RN call [facility] for transport [phone] if before 10pm to [facility] (Report [phone]). Medical Necessity Form on shadow Chart.    Final next level of care: Skilled Nursing Facility Barriers to Discharge: No Barriers Identified   Patient Goals and CMS Choice Patient states their goals for this hospitalization and ongoing recovery are:: Return home CMS Medicare.gov Compare Post Acute Care list provided to:: Patient Choice offered to / list presented to : Patient  Discharge Placement                       Discharge Plan and Services   Discharge Planning Services: CM Consult Post Acute Care Choice: Durable Medical Equipment                               Social Determinants of Health (SDOH) Interventions     Readmission Risk Interventions No flowsheet data found.

## 2019-09-04 NOTE — Progress Notes (Signed)
RT set up patient CPAP HS at beside. Patient able to place herself on and off CPAP when she is ready.

## 2019-09-29 DIAGNOSIS — R5383 Other fatigue: Secondary | ICD-10-CM | POA: Diagnosis not present

## 2019-09-29 DIAGNOSIS — Z96651 Presence of right artificial knee joint: Secondary | ICD-10-CM | POA: Diagnosis not present

## 2019-09-29 DIAGNOSIS — M5136 Other intervertebral disc degeneration, lumbar region: Secondary | ICD-10-CM | POA: Diagnosis not present

## 2019-09-29 DIAGNOSIS — J310 Chronic rhinitis: Secondary | ICD-10-CM | POA: Diagnosis not present

## 2019-09-29 DIAGNOSIS — G4733 Obstructive sleep apnea (adult) (pediatric): Secondary | ICD-10-CM | POA: Diagnosis not present

## 2019-09-29 DIAGNOSIS — J449 Chronic obstructive pulmonary disease, unspecified: Secondary | ICD-10-CM | POA: Diagnosis not present

## 2019-09-29 DIAGNOSIS — G4761 Periodic limb movement disorder: Secondary | ICD-10-CM | POA: Diagnosis not present

## 2019-09-29 DIAGNOSIS — G8929 Other chronic pain: Secondary | ICD-10-CM | POA: Diagnosis not present

## 2019-09-29 DIAGNOSIS — Z96642 Presence of left artificial hip joint: Secondary | ICD-10-CM | POA: Diagnosis not present

## 2019-09-29 DIAGNOSIS — Z981 Arthrodesis status: Secondary | ICD-10-CM | POA: Diagnosis not present

## 2019-10-14 DIAGNOSIS — M48061 Spinal stenosis, lumbar region without neurogenic claudication: Secondary | ICD-10-CM | POA: Diagnosis not present

## 2019-10-14 DIAGNOSIS — E785 Hyperlipidemia, unspecified: Secondary | ICD-10-CM | POA: Diagnosis not present

## 2019-10-14 DIAGNOSIS — J449 Chronic obstructive pulmonary disease, unspecified: Secondary | ICD-10-CM | POA: Diagnosis not present

## 2019-10-14 DIAGNOSIS — I10 Essential (primary) hypertension: Secondary | ICD-10-CM | POA: Diagnosis not present

## 2019-10-14 DIAGNOSIS — E1140 Type 2 diabetes mellitus with diabetic neuropathy, unspecified: Secondary | ICD-10-CM | POA: Diagnosis not present

## 2019-10-27 DIAGNOSIS — M25561 Pain in right knee: Secondary | ICD-10-CM | POA: Diagnosis not present

## 2019-10-27 DIAGNOSIS — G8929 Other chronic pain: Secondary | ICD-10-CM | POA: Diagnosis not present

## 2019-10-27 DIAGNOSIS — M5136 Other intervertebral disc degeneration, lumbar region: Secondary | ICD-10-CM | POA: Diagnosis not present

## 2019-10-27 DIAGNOSIS — Z981 Arthrodesis status: Secondary | ICD-10-CM | POA: Diagnosis not present

## 2019-10-27 DIAGNOSIS — Z96642 Presence of left artificial hip joint: Secondary | ICD-10-CM | POA: Diagnosis not present

## 2019-11-16 DIAGNOSIS — G4733 Obstructive sleep apnea (adult) (pediatric): Secondary | ICD-10-CM | POA: Diagnosis not present

## 2019-11-16 DIAGNOSIS — M1712 Unilateral primary osteoarthritis, left knee: Secondary | ICD-10-CM | POA: Diagnosis not present

## 2019-11-30 DIAGNOSIS — J449 Chronic obstructive pulmonary disease, unspecified: Secondary | ICD-10-CM | POA: Diagnosis not present

## 2019-11-30 DIAGNOSIS — G4733 Obstructive sleep apnea (adult) (pediatric): Secondary | ICD-10-CM | POA: Diagnosis not present

## 2019-11-30 DIAGNOSIS — R5383 Other fatigue: Secondary | ICD-10-CM | POA: Diagnosis not present

## 2019-11-30 DIAGNOSIS — J310 Chronic rhinitis: Secondary | ICD-10-CM | POA: Diagnosis not present

## 2019-11-30 DIAGNOSIS — G4761 Periodic limb movement disorder: Secondary | ICD-10-CM | POA: Diagnosis not present

## 2019-12-03 DIAGNOSIS — G629 Polyneuropathy, unspecified: Secondary | ICD-10-CM | POA: Diagnosis not present

## 2019-12-03 DIAGNOSIS — Z9181 History of falling: Secondary | ICD-10-CM | POA: Diagnosis not present

## 2019-12-07 DIAGNOSIS — S79911A Unspecified injury of right hip, initial encounter: Secondary | ICD-10-CM | POA: Diagnosis not present

## 2019-12-07 DIAGNOSIS — M545 Low back pain: Secondary | ICD-10-CM | POA: Diagnosis not present

## 2019-12-07 DIAGNOSIS — S3992XA Unspecified injury of lower back, initial encounter: Secondary | ICD-10-CM | POA: Diagnosis not present

## 2019-12-07 DIAGNOSIS — M25552 Pain in left hip: Secondary | ICD-10-CM | POA: Diagnosis not present

## 2019-12-07 DIAGNOSIS — M25551 Pain in right hip: Secondary | ICD-10-CM | POA: Diagnosis not present

## 2019-12-16 DIAGNOSIS — M1712 Unilateral primary osteoarthritis, left knee: Secondary | ICD-10-CM | POA: Diagnosis not present

## 2019-12-16 DIAGNOSIS — G4733 Obstructive sleep apnea (adult) (pediatric): Secondary | ICD-10-CM | POA: Diagnosis not present

## 2019-12-22 DIAGNOSIS — Z760 Encounter for issue of repeat prescription: Secondary | ICD-10-CM | POA: Diagnosis not present

## 2019-12-22 DIAGNOSIS — G8929 Other chronic pain: Secondary | ICD-10-CM | POA: Diagnosis not present

## 2019-12-22 DIAGNOSIS — M5441 Lumbago with sciatica, right side: Secondary | ICD-10-CM | POA: Diagnosis not present

## 2019-12-22 DIAGNOSIS — Z981 Arthrodesis status: Secondary | ICD-10-CM | POA: Diagnosis not present

## 2019-12-22 DIAGNOSIS — Z79899 Other long term (current) drug therapy: Secondary | ICD-10-CM | POA: Diagnosis not present

## 2019-12-22 DIAGNOSIS — Z96651 Presence of right artificial knee joint: Secondary | ICD-10-CM | POA: Diagnosis not present

## 2019-12-22 DIAGNOSIS — M5136 Other intervertebral disc degeneration, lumbar region: Secondary | ICD-10-CM | POA: Diagnosis not present

## 2019-12-22 DIAGNOSIS — M25561 Pain in right knee: Secondary | ICD-10-CM | POA: Diagnosis not present

## 2019-12-22 DIAGNOSIS — M5442 Lumbago with sciatica, left side: Secondary | ICD-10-CM | POA: Diagnosis not present

## 2019-12-22 DIAGNOSIS — Z79891 Long term (current) use of opiate analgesic: Secondary | ICD-10-CM | POA: Diagnosis not present

## 2019-12-24 DIAGNOSIS — T8484XA Pain due to internal orthopedic prosthetic devices, implants and grafts, initial encounter: Secondary | ICD-10-CM | POA: Diagnosis not present

## 2019-12-28 DIAGNOSIS — M5442 Lumbago with sciatica, left side: Secondary | ICD-10-CM | POA: Diagnosis not present

## 2019-12-28 DIAGNOSIS — S3992XA Unspecified injury of lower back, initial encounter: Secondary | ICD-10-CM | POA: Diagnosis not present

## 2020-01-06 DIAGNOSIS — E785 Hyperlipidemia, unspecified: Secondary | ICD-10-CM | POA: Diagnosis not present

## 2020-01-06 DIAGNOSIS — Z0000 Encounter for general adult medical examination without abnormal findings: Secondary | ICD-10-CM | POA: Diagnosis not present

## 2020-01-06 DIAGNOSIS — Z9181 History of falling: Secondary | ICD-10-CM | POA: Diagnosis not present

## 2020-01-06 DIAGNOSIS — Z1331 Encounter for screening for depression: Secondary | ICD-10-CM | POA: Diagnosis not present

## 2020-01-06 DIAGNOSIS — Z6841 Body Mass Index (BMI) 40.0 and over, adult: Secondary | ICD-10-CM | POA: Diagnosis not present

## 2020-01-11 DIAGNOSIS — M48061 Spinal stenosis, lumbar region without neurogenic claudication: Secondary | ICD-10-CM | POA: Diagnosis not present

## 2020-01-11 DIAGNOSIS — M545 Low back pain: Secondary | ICD-10-CM | POA: Diagnosis not present

## 2020-01-11 DIAGNOSIS — M4807 Spinal stenosis, lumbosacral region: Secondary | ICD-10-CM | POA: Diagnosis not present

## 2020-01-14 DIAGNOSIS — Z981 Arthrodesis status: Secondary | ICD-10-CM | POA: Diagnosis not present

## 2020-01-14 DIAGNOSIS — M48061 Spinal stenosis, lumbar region without neurogenic claudication: Secondary | ICD-10-CM | POA: Diagnosis not present

## 2020-01-14 DIAGNOSIS — Z760 Encounter for issue of repeat prescription: Secondary | ICD-10-CM | POA: Diagnosis not present

## 2020-01-14 DIAGNOSIS — G8929 Other chronic pain: Secondary | ICD-10-CM | POA: Diagnosis not present

## 2020-01-14 DIAGNOSIS — Z79891 Long term (current) use of opiate analgesic: Secondary | ICD-10-CM | POA: Diagnosis not present

## 2020-01-14 DIAGNOSIS — M5136 Other intervertebral disc degeneration, lumbar region: Secondary | ICD-10-CM | POA: Diagnosis not present

## 2020-01-22 DIAGNOSIS — Z13820 Encounter for screening for osteoporosis: Secondary | ICD-10-CM | POA: Diagnosis not present

## 2020-01-22 DIAGNOSIS — N959 Unspecified menopausal and perimenopausal disorder: Secondary | ICD-10-CM | POA: Diagnosis not present

## 2020-02-05 DIAGNOSIS — M48061 Spinal stenosis, lumbar region without neurogenic claudication: Secondary | ICD-10-CM | POA: Diagnosis not present

## 2020-02-15 DIAGNOSIS — Z6841 Body Mass Index (BMI) 40.0 and over, adult: Secondary | ICD-10-CM | POA: Diagnosis not present

## 2020-02-15 DIAGNOSIS — G8929 Other chronic pain: Secondary | ICD-10-CM | POA: Diagnosis not present

## 2020-02-15 DIAGNOSIS — E1142 Type 2 diabetes mellitus with diabetic polyneuropathy: Secondary | ICD-10-CM | POA: Diagnosis not present

## 2020-02-15 DIAGNOSIS — I10 Essential (primary) hypertension: Secondary | ICD-10-CM | POA: Diagnosis not present

## 2020-02-15 DIAGNOSIS — E785 Hyperlipidemia, unspecified: Secondary | ICD-10-CM | POA: Diagnosis not present

## 2020-02-15 DIAGNOSIS — J449 Chronic obstructive pulmonary disease, unspecified: Secondary | ICD-10-CM | POA: Diagnosis not present

## 2020-02-15 DIAGNOSIS — K754 Autoimmune hepatitis: Secondary | ICD-10-CM | POA: Diagnosis not present

## 2020-02-15 DIAGNOSIS — D6859 Other primary thrombophilia: Secondary | ICD-10-CM | POA: Diagnosis not present

## 2020-02-15 DIAGNOSIS — G4730 Sleep apnea, unspecified: Secondary | ICD-10-CM | POA: Diagnosis not present

## 2020-02-16 DIAGNOSIS — G8929 Other chronic pain: Secondary | ICD-10-CM | POA: Diagnosis not present

## 2020-02-16 DIAGNOSIS — Z79891 Long term (current) use of opiate analgesic: Secondary | ICD-10-CM | POA: Diagnosis not present

## 2020-02-16 DIAGNOSIS — Z760 Encounter for issue of repeat prescription: Secondary | ICD-10-CM | POA: Diagnosis not present

## 2020-02-16 DIAGNOSIS — Z96651 Presence of right artificial knee joint: Secondary | ICD-10-CM | POA: Diagnosis not present

## 2020-02-16 DIAGNOSIS — Z79899 Other long term (current) drug therapy: Secondary | ICD-10-CM | POA: Diagnosis not present

## 2020-02-16 DIAGNOSIS — M48061 Spinal stenosis, lumbar region without neurogenic claudication: Secondary | ICD-10-CM | POA: Diagnosis not present

## 2020-02-16 DIAGNOSIS — M5136 Other intervertebral disc degeneration, lumbar region: Secondary | ICD-10-CM | POA: Diagnosis not present

## 2020-02-16 DIAGNOSIS — Z981 Arthrodesis status: Secondary | ICD-10-CM | POA: Diagnosis not present

## 2020-02-17 DIAGNOSIS — E1165 Type 2 diabetes mellitus with hyperglycemia: Secondary | ICD-10-CM | POA: Diagnosis not present

## 2020-02-17 DIAGNOSIS — Z23 Encounter for immunization: Secondary | ICD-10-CM | POA: Diagnosis not present

## 2020-02-17 DIAGNOSIS — Z79899 Other long term (current) drug therapy: Secondary | ICD-10-CM | POA: Diagnosis not present

## 2020-02-17 DIAGNOSIS — I10 Essential (primary) hypertension: Secondary | ICD-10-CM | POA: Diagnosis not present

## 2020-02-17 DIAGNOSIS — M48061 Spinal stenosis, lumbar region without neurogenic claudication: Secondary | ICD-10-CM | POA: Diagnosis not present

## 2020-02-17 DIAGNOSIS — E785 Hyperlipidemia, unspecified: Secondary | ICD-10-CM | POA: Diagnosis not present

## 2020-02-17 DIAGNOSIS — J449 Chronic obstructive pulmonary disease, unspecified: Secondary | ICD-10-CM | POA: Diagnosis not present

## 2020-02-17 DIAGNOSIS — Z6841 Body Mass Index (BMI) 40.0 and over, adult: Secondary | ICD-10-CM | POA: Diagnosis not present

## 2020-02-17 DIAGNOSIS — E1140 Type 2 diabetes mellitus with diabetic neuropathy, unspecified: Secondary | ICD-10-CM | POA: Diagnosis not present

## 2020-02-23 DIAGNOSIS — H524 Presbyopia: Secondary | ICD-10-CM | POA: Diagnosis not present

## 2020-02-23 DIAGNOSIS — Z7984 Long term (current) use of oral hypoglycemic drugs: Secondary | ICD-10-CM | POA: Diagnosis not present

## 2020-02-23 DIAGNOSIS — H5203 Hypermetropia, bilateral: Secondary | ICD-10-CM | POA: Diagnosis not present

## 2020-02-23 DIAGNOSIS — I10 Essential (primary) hypertension: Secondary | ICD-10-CM | POA: Diagnosis not present

## 2020-02-23 DIAGNOSIS — Z0100 Encounter for examination of eyes and vision without abnormal findings: Secondary | ICD-10-CM | POA: Diagnosis not present

## 2020-02-23 DIAGNOSIS — H25813 Combined forms of age-related cataract, bilateral: Secondary | ICD-10-CM | POA: Diagnosis not present

## 2020-02-23 DIAGNOSIS — H52223 Regular astigmatism, bilateral: Secondary | ICD-10-CM | POA: Diagnosis not present

## 2020-02-23 DIAGNOSIS — E119 Type 2 diabetes mellitus without complications: Secondary | ICD-10-CM | POA: Diagnosis not present

## 2020-03-07 DIAGNOSIS — J449 Chronic obstructive pulmonary disease, unspecified: Secondary | ICD-10-CM | POA: Diagnosis not present

## 2020-03-07 DIAGNOSIS — G4761 Periodic limb movement disorder: Secondary | ICD-10-CM | POA: Diagnosis not present

## 2020-03-07 DIAGNOSIS — G4733 Obstructive sleep apnea (adult) (pediatric): Secondary | ICD-10-CM | POA: Diagnosis not present

## 2020-03-07 DIAGNOSIS — M48061 Spinal stenosis, lumbar region without neurogenic claudication: Secondary | ICD-10-CM | POA: Diagnosis not present

## 2020-03-07 DIAGNOSIS — J310 Chronic rhinitis: Secondary | ICD-10-CM | POA: Diagnosis not present

## 2020-03-07 DIAGNOSIS — R5383 Other fatigue: Secondary | ICD-10-CM | POA: Diagnosis not present

## 2020-03-07 DIAGNOSIS — R69 Illness, unspecified: Secondary | ICD-10-CM | POA: Diagnosis not present

## 2020-03-17 DIAGNOSIS — Z760 Encounter for issue of repeat prescription: Secondary | ICD-10-CM | POA: Diagnosis not present

## 2020-03-17 DIAGNOSIS — M5136 Other intervertebral disc degeneration, lumbar region: Secondary | ICD-10-CM | POA: Diagnosis not present

## 2020-03-17 DIAGNOSIS — M48061 Spinal stenosis, lumbar region without neurogenic claudication: Secondary | ICD-10-CM | POA: Diagnosis not present

## 2020-03-17 DIAGNOSIS — Z79899 Other long term (current) drug therapy: Secondary | ICD-10-CM | POA: Diagnosis not present

## 2020-03-17 DIAGNOSIS — G8929 Other chronic pain: Secondary | ICD-10-CM | POA: Diagnosis not present

## 2020-03-17 DIAGNOSIS — M961 Postlaminectomy syndrome, not elsewhere classified: Secondary | ICD-10-CM | POA: Diagnosis not present

## 2020-03-17 DIAGNOSIS — Z79891 Long term (current) use of opiate analgesic: Secondary | ICD-10-CM | POA: Diagnosis not present

## 2020-03-23 ENCOUNTER — Ambulatory Visit (INDEPENDENT_AMBULATORY_CARE_PROVIDER_SITE_OTHER): Payer: Medicare HMO | Admitting: Physician Assistant

## 2020-03-23 ENCOUNTER — Ambulatory Visit (INDEPENDENT_AMBULATORY_CARE_PROVIDER_SITE_OTHER): Payer: Medicare HMO

## 2020-03-23 ENCOUNTER — Encounter: Payer: Self-pay | Admitting: Physician Assistant

## 2020-03-23 ENCOUNTER — Other Ambulatory Visit: Payer: Self-pay

## 2020-03-23 ENCOUNTER — Ambulatory Visit: Payer: Medicaid Other | Admitting: Orthopaedic Surgery

## 2020-03-23 DIAGNOSIS — M7061 Trochanteric bursitis, right hip: Secondary | ICD-10-CM

## 2020-03-23 DIAGNOSIS — Z96642 Presence of left artificial hip joint: Secondary | ICD-10-CM

## 2020-03-23 NOTE — Progress Notes (Signed)
Office Visit Note   Patient: Nancy Simmons           Date of Birth: 03/22/1956           MRN: [id number] Visit Date: 03/23/2020              Requested by: Wilmer Floor., MD [address] [place],  [place] [address] PCP: Wilmer Floor., MD   Assessment & Plan: Visit Diagnoses:  1. Status post left hip replacement   2. Trochanteric bursitis of right hip     Plan: Reassurance was given to the patient that her left total hip clinically and radiographically are without any concerning findings.  In regards to the right hip we will see what type of response she had to the trochanteric injection.  She understands she needs to wait at least 3 months between injections but can have a repeat injection if it is beneficial.  She will follow-up with [place] as needed. Advised her to speak with her primary care physician about her taking Mobic and the Eliquis.  Instructed her not to take the Mobic until speaking with them.  Follow-Up Instructions: Return if symptoms worsen or fail to improve.   Orders:  Orders Placed This Encounter  Procedures  . XR HIPS BILAT W OR W/O PELVIS 2V   No orders of the defined types were placed in this encounter.     Procedures: No procedures performed   Clinical Data: No additional findings.   Subjective: Chief Complaint  Patient presents with  . Left Hip - Pain  . Right Hip - Pain    HPI Nancy Simmons is a 64 year old female well-known to Dr. Magnus Ivan service underwent left total hip 12/10/2017.  She is having pain in the groin area of both hips having pain that is constant in the right hip in the groin and laterally.  She does have pain that radiates from the left hip down the side of her leg.  She is due to see Dr. Lovell Sheehan for possible spinal stimulator placement.  She had a fall in June off her walker seat whenever it broke.  She is in pain management is on oxycodone morphine.  She is taking most week.  Is on chronic  anticoagulation. Review of Systems Negative for fevers chills.  Objective: Vital Signs: There were no vitals taken for this visit.  Physical Exam General: Well-developed well-nourished female no acute distress mood affect appropriate. Psych: Alert and oriented x3. Ortho Exam Bilateral hips fluid range of motion of both hips.  Tenderness with palpation over the trochanteric region of both hips greater on the right on the left. Specialty Comments:  No specialty comments available.  Imaging: XR HIPS BILAT W OR W/O PELVIS 2V  Result Date: 03/23/2020 AP pelvis and lateral view of both hips: Shows well-seated right total hip arthroplasty components without any complicating features.  No acute fractures.  Both hips are well located.  Mild arthritic changes of the right hip.    PMFS History: Patient Active Problem List   Diagnosis Date Noted  . Chest pain, unspecified 06/20/2018  . CAD in native artery 06/20/2018  . Morbid obesity (HCC) 06/20/2018  . History of DVT (deep vein thrombosis) 06/20/2018  . Chest pain 06/20/2018  . Lumbar herniated disc 05/22/2018  . Status post left hip replacement 12/10/2017  . Unilateral primary osteoarthritis, left hip 11/04/2017  . Abnormal stress echocardiography   . Stable angina (HCC) 04/03/2017  . DM2 (diabetes mellitus, type  2) (HCC) 04/03/2017  . Asthma 04/03/2017  . OSA on CPAP 04/03/2017  . Angina at rest [facility]) 04/03/2017  . Medication monitoring encounter 07/09/2016  . Autoimmune hepatitis (HCC) 07/09/2016  . 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 HTN (hypertension) 09/27/2015  . CAD (coronary artery disease) of artery bypass graft 09/27/2015  . Hypercholesterolemia 09/27/2015  . S/P cholecystectomy 09/27/2015   Past Medical History:  Diagnosis Date  . Arthritis    "pretty much all over" (04/03/2017)  . Asthma   . Autoimmune hepatitis (HCC)   . COPD (chronic obstructive pulmonary disease) (HCC)   . Coronary  artery disease    a. mild-mod by 03/2017 following false positive nuc - She subsequently underwent cath with 30% LM, 30% ostial LAD, 45% mLAD, 50% dLAD, 30% prox RCA, no flow limiting lesions, LVEF 55-65%.   . Family history of adverse reaction to anesthesia    "granddaughter has PONV"  . GERD (gastroesophageal reflux disease)   . High cholesterol   . History of blood transfusion    "in [place]; related to knee surgeries"  . History of kidney stones   . Hypertension   . OSA on CPAP    CPAP, pressure settings 11  . Type II diabetes mellitus (HCC)     Family History  Problem Relation Age of Onset  . CAD Mother   . Heart disease Mother        Enlarged heart  . CAD Father   . Diabetes Father   . Heart failure Father   . CAD Brother   . Diabetes Brother   . Heart disease Brother        Defibrillator  . Colon cancer Other   . Stroke Other     Past Surgical History:  Procedure Laterality Date  . ABDOMINAL HYSTERECTOMY    . BACK SURGERY    . CARDIAC CATHETERIZATION    . CARPAL TUNNEL RELEASE Bilateral    "done in [place]"  . CHOLECYSTECTOMY    . INCISION AND DRAINAGE Right 2015   S/P knee revision  . INTRAVASCULAR PRESSURE WIRE/FFR STUDY N/A 04/05/2017   Procedure: INTRAVASCULAR PRESSURE WIRE/FFR STUDY;  Surgeon: Marykay Lex, MD;  Location: [facility] INVASIVE CV LAB;  Service: Cardiovascular;  Laterality: N/A;  . JOINT REPLACEMENT    . LEFT HEART CATH AND CORONARY ANGIOGRAPHY N/A 04/05/2017   Procedure: LEFT HEART CATH AND CORONARY ANGIOGRAPHY;  Surgeon: Marykay Lex, MD;  Location: [facility] INVASIVE CV LAB;  Service: Cardiovascular;  Laterality: N/A;  . LUMBAR LAMINECTOMY/DECOMPRESSION MICRODISCECTOMY Left 05/22/2018   Procedure: MICRODISCECTOMY LUMBAR TWOLUMBAR THREE;  Surgeon: Tressie Stalker, MD;  Location[facility] OR;  Service: Neurosurgery;  Laterality: Left;  . LUMBAR LAMINECTOMY/DECOMPRESSION MICRODISCECTOMY Left 09/03/2019   Procedure: LEFT LUMBAR TWO- LUMBAR THREE  MICRODISCECTOMY;  Surgeon: Tressie Stalker, MD;  Location: [facility] OR;  Service: Neurosurgery;  Laterality: Left;  MICRODISCECTOMY LEFT LUMBAR 2- LUMBAR 3  . POSTERIOR FUSION LUMBAR SPINE  05/2010  . REVISION TOTAL KNEE ARTHROPLASTY Right 2015  . SHOULDER ARTHROSCOPY WITH ROTATOR CUFF REPAIR Right   . SHOULDER OPEN ROTATOR CUFF REPAIR Left    "done in [place]"  . TOTAL HIP ARTHROPLASTY Left 12/10/2017   Procedure: LEFT TOTAL HIP ARTHROPLASTY ANTERIOR APPROACH;  Surgeon: Kathryne Hitch, MD;  Location: [facility] OR;  Service: Orthopedics;  Laterality: Left;  . TOTAL KNEE ARTHROPLASTY Bilateral 1990s   Social History   Occupational History  . Not on file  Tobacco Use  . Smoking status:  Current Every Day Smoker    Packs/day: 0.50    Years: 43.00    Pack years: 21.50    Types: Cigarettes    Start date: 07/23/1972    Last attempt to quit: 12/06/2017    Years since quitting: 2.2  . Smokeless tobacco: Never Used  Vaping Use  . Vaping Use: Never used  Substance and Sexual Activity  . Alcohol use: No    Alcohol/week: 0.0 standard drinks  . Drug use: Not Currently    Types: "Crack" cocaine    Comment: Remote h/o cocaine use  . Sexual activity: Not Currently

## 2020-03-29 DIAGNOSIS — M4807 Spinal stenosis, lumbosacral region: Secondary | ICD-10-CM | POA: Diagnosis not present

## 2020-04-07 DIAGNOSIS — M48061 Spinal stenosis, lumbar region without neurogenic claudication: Secondary | ICD-10-CM | POA: Diagnosis not present

## 2020-04-11 DIAGNOSIS — M47812 Spondylosis without myelopathy or radiculopathy, cervical region: Secondary | ICD-10-CM | POA: Diagnosis not present

## 2020-04-11 DIAGNOSIS — Z79899 Other long term (current) drug therapy: Secondary | ICD-10-CM | POA: Diagnosis not present

## 2020-04-11 DIAGNOSIS — Z6841 Body Mass Index (BMI) 40.0 and over, adult: Secondary | ICD-10-CM | POA: Diagnosis not present

## 2020-04-13 DIAGNOSIS — I10 Essential (primary) hypertension: Secondary | ICD-10-CM | POA: Diagnosis not present

## 2020-04-13 DIAGNOSIS — G894 Chronic pain syndrome: Secondary | ICD-10-CM | POA: Diagnosis not present

## 2020-04-13 DIAGNOSIS — Z6841 Body Mass Index (BMI) 40.0 and over, adult: Secondary | ICD-10-CM | POA: Diagnosis not present

## 2020-04-13 DIAGNOSIS — Z9889 Other specified postprocedural states: Secondary | ICD-10-CM | POA: Diagnosis not present

## 2020-04-13 DIAGNOSIS — M545 Low back pain: Secondary | ICD-10-CM | POA: Diagnosis not present

## 2020-07-24 DIAGNOSIS — G4733 Obstructive sleep apnea (adult) (pediatric): Secondary | ICD-10-CM | POA: Diagnosis not present

## 2020-07-27 DIAGNOSIS — Z20822 Contact with and (suspected) exposure to covid-19: Secondary | ICD-10-CM | POA: Diagnosis not present

## 2020-08-07 DIAGNOSIS — M48061 Spinal stenosis, lumbar region without neurogenic claudication: Secondary | ICD-10-CM | POA: Diagnosis not present

## 2020-08-10 DIAGNOSIS — Z1152 Encounter for screening for COVID-19: Secondary | ICD-10-CM | POA: Diagnosis not present

## 2020-08-12 DIAGNOSIS — G894 Chronic pain syndrome: Secondary | ICD-10-CM | POA: Diagnosis not present

## 2020-08-12 DIAGNOSIS — M5416 Radiculopathy, lumbar region: Secondary | ICD-10-CM | POA: Diagnosis not present

## 2020-08-12 DIAGNOSIS — Z9889 Other specified postprocedural states: Secondary | ICD-10-CM | POA: Diagnosis not present

## 2020-08-24 DIAGNOSIS — G4733 Obstructive sleep apnea (adult) (pediatric): Secondary | ICD-10-CM | POA: Diagnosis not present

## 2020-08-31 DIAGNOSIS — Z79899 Other long term (current) drug therapy: Secondary | ICD-10-CM | POA: Diagnosis not present

## 2020-08-31 DIAGNOSIS — J449 Chronic obstructive pulmonary disease, unspecified: Secondary | ICD-10-CM | POA: Diagnosis not present

## 2020-08-31 DIAGNOSIS — E1165 Type 2 diabetes mellitus with hyperglycemia: Secondary | ICD-10-CM | POA: Diagnosis not present

## 2020-08-31 DIAGNOSIS — Z01818 Encounter for other preprocedural examination: Secondary | ICD-10-CM | POA: Diagnosis not present

## 2020-08-31 DIAGNOSIS — K754 Autoimmune hepatitis: Secondary | ICD-10-CM | POA: Diagnosis not present

## 2020-08-31 DIAGNOSIS — I10 Essential (primary) hypertension: Secondary | ICD-10-CM | POA: Diagnosis not present

## 2020-08-31 DIAGNOSIS — E1140 Type 2 diabetes mellitus with diabetic neuropathy, unspecified: Secondary | ICD-10-CM | POA: Diagnosis not present

## 2020-08-31 DIAGNOSIS — M48061 Spinal stenosis, lumbar region without neurogenic claudication: Secondary | ICD-10-CM | POA: Diagnosis not present

## 2020-08-31 DIAGNOSIS — E785 Hyperlipidemia, unspecified: Secondary | ICD-10-CM | POA: Diagnosis not present

## 2020-08-31 DIAGNOSIS — Z87891 Personal history of nicotine dependence: Secondary | ICD-10-CM | POA: Diagnosis not present

## 2020-09-05 DIAGNOSIS — G4733 Obstructive sleep apnea (adult) (pediatric): Secondary | ICD-10-CM | POA: Diagnosis not present

## 2020-09-05 DIAGNOSIS — J310 Chronic rhinitis: Secondary | ICD-10-CM | POA: Diagnosis not present

## 2020-09-05 DIAGNOSIS — G4761 Periodic limb movement disorder: Secondary | ICD-10-CM | POA: Diagnosis not present

## 2020-09-05 DIAGNOSIS — F17210 Nicotine dependence, cigarettes, uncomplicated: Secondary | ICD-10-CM | POA: Diagnosis not present

## 2020-09-05 DIAGNOSIS — R5383 Other fatigue: Secondary | ICD-10-CM | POA: Diagnosis not present

## 2020-09-05 DIAGNOSIS — J449 Chronic obstructive pulmonary disease, unspecified: Secondary | ICD-10-CM | POA: Diagnosis not present

## 2020-09-06 DIAGNOSIS — M961 Postlaminectomy syndrome, not elsewhere classified: Secondary | ICD-10-CM | POA: Diagnosis not present

## 2020-09-06 DIAGNOSIS — M5116 Intervertebral disc disorders with radiculopathy, lumbar region: Secondary | ICD-10-CM | POA: Diagnosis not present

## 2020-09-06 DIAGNOSIS — Z79891 Long term (current) use of opiate analgesic: Secondary | ICD-10-CM | POA: Diagnosis not present

## 2020-09-06 DIAGNOSIS — Z760 Encounter for issue of repeat prescription: Secondary | ICD-10-CM | POA: Diagnosis not present

## 2020-09-06 DIAGNOSIS — G8929 Other chronic pain: Secondary | ICD-10-CM | POA: Diagnosis not present

## 2020-09-06 DIAGNOSIS — Z79899 Other long term (current) drug therapy: Secondary | ICD-10-CM | POA: Diagnosis not present

## 2020-09-07 DIAGNOSIS — M48061 Spinal stenosis, lumbar region without neurogenic claudication: Secondary | ICD-10-CM | POA: Diagnosis not present

## 2020-09-09 DIAGNOSIS — G894 Chronic pain syndrome: Secondary | ICD-10-CM | POA: Diagnosis not present

## 2020-09-09 DIAGNOSIS — M961 Postlaminectomy syndrome, not elsewhere classified: Secondary | ICD-10-CM | POA: Diagnosis not present

## 2020-09-09 DIAGNOSIS — Z9889 Other specified postprocedural states: Secondary | ICD-10-CM | POA: Diagnosis not present

## 2020-09-20 DIAGNOSIS — R0600 Dyspnea, unspecified: Secondary | ICD-10-CM | POA: Diagnosis not present

## 2020-09-20 DIAGNOSIS — I517 Cardiomegaly: Secondary | ICD-10-CM | POA: Diagnosis not present

## 2020-09-21 DIAGNOSIS — G4733 Obstructive sleep apnea (adult) (pediatric): Secondary | ICD-10-CM | POA: Diagnosis not present

## 2020-10-13 DIAGNOSIS — G4733 Obstructive sleep apnea (adult) (pediatric): Secondary | ICD-10-CM | POA: Diagnosis not present

## 2020-10-13 DIAGNOSIS — G4761 Periodic limb movement disorder: Secondary | ICD-10-CM | POA: Diagnosis not present

## 2020-10-13 DIAGNOSIS — R5383 Other fatigue: Secondary | ICD-10-CM | POA: Diagnosis not present

## 2020-10-13 DIAGNOSIS — J449 Chronic obstructive pulmonary disease, unspecified: Secondary | ICD-10-CM | POA: Diagnosis not present

## 2020-10-13 DIAGNOSIS — J310 Chronic rhinitis: Secondary | ICD-10-CM | POA: Diagnosis not present

## 2020-10-13 DIAGNOSIS — F17210 Nicotine dependence, cigarettes, uncomplicated: Secondary | ICD-10-CM | POA: Diagnosis not present

## 2020-10-17 DIAGNOSIS — G894 Chronic pain syndrome: Secondary | ICD-10-CM | POA: Diagnosis not present

## 2020-10-17 DIAGNOSIS — M961 Postlaminectomy syndrome, not elsewhere classified: Secondary | ICD-10-CM | POA: Diagnosis not present

## 2020-10-17 DIAGNOSIS — Z9889 Other specified postprocedural states: Secondary | ICD-10-CM | POA: Diagnosis not present

## 2020-10-18 DIAGNOSIS — M159 Polyosteoarthritis, unspecified: Secondary | ICD-10-CM | POA: Diagnosis not present

## 2020-10-18 DIAGNOSIS — I10 Essential (primary) hypertension: Secondary | ICD-10-CM | POA: Diagnosis not present

## 2020-10-18 DIAGNOSIS — Z743 Need for continuous supervision: Secondary | ICD-10-CM | POA: Diagnosis not present

## 2020-10-18 DIAGNOSIS — Z79899 Other long term (current) drug therapy: Secondary | ICD-10-CM | POA: Diagnosis not present

## 2020-10-18 DIAGNOSIS — Z139 Encounter for screening, unspecified: Secondary | ICD-10-CM | POA: Diagnosis not present

## 2020-10-18 DIAGNOSIS — R6889 Other general symptoms and signs: Secondary | ICD-10-CM | POA: Diagnosis not present

## 2020-10-18 DIAGNOSIS — I517 Cardiomegaly: Secondary | ICD-10-CM | POA: Diagnosis not present

## 2020-10-18 DIAGNOSIS — R42 Dizziness and giddiness: Secondary | ICD-10-CM | POA: Diagnosis not present

## 2020-10-18 DIAGNOSIS — J811 Chronic pulmonary edema: Secondary | ICD-10-CM | POA: Diagnosis not present

## 2020-10-21 DIAGNOSIS — M48061 Spinal stenosis, lumbar region without neurogenic claudication: Secondary | ICD-10-CM | POA: Diagnosis not present

## 2020-10-22 DIAGNOSIS — G4733 Obstructive sleep apnea (adult) (pediatric): Secondary | ICD-10-CM | POA: Diagnosis not present

## 2020-10-27 DIAGNOSIS — I10 Essential (primary) hypertension: Secondary | ICD-10-CM | POA: Diagnosis not present

## 2020-10-27 DIAGNOSIS — K754 Autoimmune hepatitis: Secondary | ICD-10-CM | POA: Diagnosis not present

## 2020-10-27 DIAGNOSIS — G4730 Sleep apnea, unspecified: Secondary | ICD-10-CM | POA: Diagnosis not present

## 2020-10-27 DIAGNOSIS — Z79899 Other long term (current) drug therapy: Secondary | ICD-10-CM | POA: Diagnosis not present

## 2020-10-27 DIAGNOSIS — J449 Chronic obstructive pulmonary disease, unspecified: Secondary | ICD-10-CM | POA: Diagnosis not present

## 2020-11-09 DIAGNOSIS — G4733 Obstructive sleep apnea (adult) (pediatric): Secondary | ICD-10-CM | POA: Diagnosis not present

## 2020-11-09 DIAGNOSIS — M1712 Unilateral primary osteoarthritis, left knee: Secondary | ICD-10-CM | POA: Diagnosis not present

## 2020-11-21 DIAGNOSIS — G4733 Obstructive sleep apnea (adult) (pediatric): Secondary | ICD-10-CM | POA: Diagnosis not present

## 2020-11-29 DIAGNOSIS — I10 Essential (primary) hypertension: Secondary | ICD-10-CM | POA: Diagnosis not present

## 2020-11-29 DIAGNOSIS — M48061 Spinal stenosis, lumbar region without neurogenic claudication: Secondary | ICD-10-CM | POA: Diagnosis not present

## 2020-11-29 DIAGNOSIS — E1165 Type 2 diabetes mellitus with hyperglycemia: Secondary | ICD-10-CM | POA: Diagnosis not present

## 2020-11-29 DIAGNOSIS — E785 Hyperlipidemia, unspecified: Secondary | ICD-10-CM | POA: Diagnosis not present

## 2020-11-29 DIAGNOSIS — Z79899 Other long term (current) drug therapy: Secondary | ICD-10-CM | POA: Diagnosis not present

## 2020-11-29 DIAGNOSIS — M159 Polyosteoarthritis, unspecified: Secondary | ICD-10-CM | POA: Diagnosis not present

## 2020-11-29 DIAGNOSIS — J449 Chronic obstructive pulmonary disease, unspecified: Secondary | ICD-10-CM | POA: Diagnosis not present

## 2020-12-05 DIAGNOSIS — M48061 Spinal stenosis, lumbar region without neurogenic claudication: Secondary | ICD-10-CM | POA: Diagnosis not present

## 2020-12-06 DIAGNOSIS — M961 Postlaminectomy syndrome, not elsewhere classified: Secondary | ICD-10-CM | POA: Diagnosis not present

## 2020-12-06 DIAGNOSIS — M5136 Other intervertebral disc degeneration, lumbar region: Secondary | ICD-10-CM | POA: Diagnosis not present

## 2020-12-06 DIAGNOSIS — M48061 Spinal stenosis, lumbar region without neurogenic claudication: Secondary | ICD-10-CM | POA: Diagnosis not present

## 2020-12-06 DIAGNOSIS — Z9689 Presence of other specified functional implants: Secondary | ICD-10-CM | POA: Diagnosis not present

## 2020-12-06 DIAGNOSIS — Z79891 Long term (current) use of opiate analgesic: Secondary | ICD-10-CM | POA: Diagnosis not present

## 2020-12-19 IMAGING — CR DG LUMBAR SPINE 1V
1 series · 1 of 1 positions shown · non-contrast
Comparison: Lumbar spine radiographs 07/28/2019

CLINICAL DATA: Peri op lumbar spine

EXAM:
LUMBAR SPINE - 1 VIEW

[lateral]
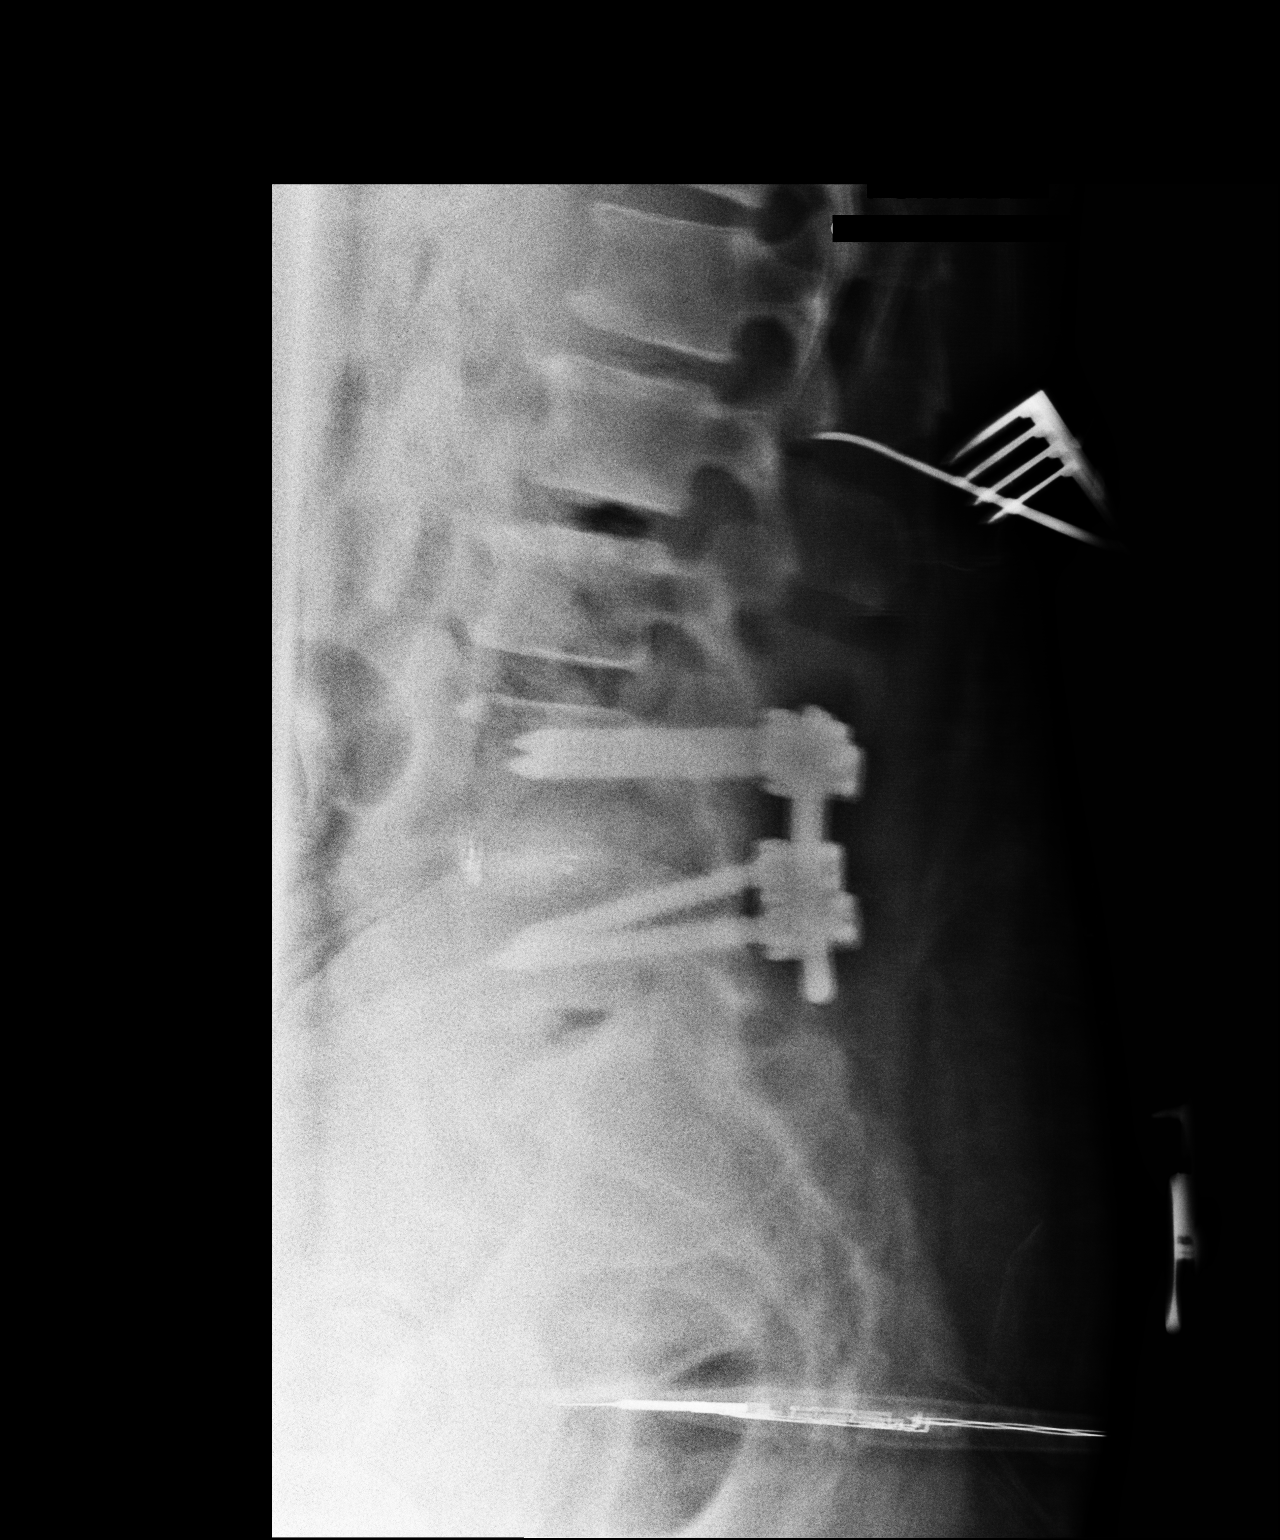

[1 of 1 positions shown; findings below may reference images not displayed]

FINDINGS: Single lateral intraoperative projection demonstrates surgical probe
at the posterior elements of L2.
IMPRESSION: Surgical probe at the posterior elements of L2.

## 2020-12-22 DIAGNOSIS — G4733 Obstructive sleep apnea (adult) (pediatric): Secondary | ICD-10-CM | POA: Diagnosis not present

## 2020-12-24 DIAGNOSIS — R079 Chest pain, unspecified: Secondary | ICD-10-CM | POA: Diagnosis not present

## 2020-12-24 DIAGNOSIS — M25512 Pain in left shoulder: Secondary | ICD-10-CM | POA: Diagnosis not present

## 2020-12-24 DIAGNOSIS — R0789 Other chest pain: Secondary | ICD-10-CM | POA: Diagnosis not present

## 2020-12-26 ENCOUNTER — Ambulatory Visit: Payer: Medicare Other | Admitting: Physician Assistant

## 2020-12-28 DIAGNOSIS — N390 Urinary tract infection, site not specified: Secondary | ICD-10-CM | POA: Diagnosis not present

## 2020-12-28 DIAGNOSIS — R1084 Generalized abdominal pain: Secondary | ICD-10-CM | POA: Diagnosis not present

## 2020-12-28 DIAGNOSIS — Z79899 Other long term (current) drug therapy: Secondary | ICD-10-CM | POA: Diagnosis not present

## 2021-01-02 ENCOUNTER — Ambulatory Visit: Payer: Medicare Other | Admitting: Physician Assistant

## 2021-01-04 ENCOUNTER — Ambulatory Visit: Payer: Medicare Other | Admitting: Physician Assistant

## 2021-01-05 DIAGNOSIS — M48061 Spinal stenosis, lumbar region without neurogenic claudication: Secondary | ICD-10-CM | POA: Diagnosis not present

## 2021-01-10 DIAGNOSIS — Z0000 Encounter for general adult medical examination without abnormal findings: Secondary | ICD-10-CM | POA: Diagnosis not present

## 2021-01-10 DIAGNOSIS — E785 Hyperlipidemia, unspecified: Secondary | ICD-10-CM | POA: Diagnosis not present

## 2021-01-10 DIAGNOSIS — Z9181 History of falling: Secondary | ICD-10-CM | POA: Diagnosis not present

## 2021-01-19 DIAGNOSIS — J449 Chronic obstructive pulmonary disease, unspecified: Secondary | ICD-10-CM | POA: Diagnosis not present

## 2021-01-19 DIAGNOSIS — R5383 Other fatigue: Secondary | ICD-10-CM | POA: Diagnosis not present

## 2021-01-19 DIAGNOSIS — G4761 Periodic limb movement disorder: Secondary | ICD-10-CM | POA: Diagnosis not present

## 2021-01-19 DIAGNOSIS — J310 Chronic rhinitis: Secondary | ICD-10-CM | POA: Diagnosis not present

## 2021-01-19 DIAGNOSIS — G4733 Obstructive sleep apnea (adult) (pediatric): Secondary | ICD-10-CM | POA: Diagnosis not present

## 2021-01-21 DIAGNOSIS — G4733 Obstructive sleep apnea (adult) (pediatric): Secondary | ICD-10-CM | POA: Diagnosis not present

## 2021-01-25 DIAGNOSIS — N390 Urinary tract infection, site not specified: Secondary | ICD-10-CM | POA: Diagnosis not present

## 2021-01-25 DIAGNOSIS — N3281 Overactive bladder: Secondary | ICD-10-CM | POA: Diagnosis not present

## 2021-01-25 DIAGNOSIS — R1084 Generalized abdominal pain: Secondary | ICD-10-CM | POA: Diagnosis not present

## 2021-02-02 DIAGNOSIS — E1142 Type 2 diabetes mellitus with diabetic polyneuropathy: Secondary | ICD-10-CM | POA: Diagnosis not present

## 2021-02-02 DIAGNOSIS — J449 Chronic obstructive pulmonary disease, unspecified: Secondary | ICD-10-CM | POA: Diagnosis not present

## 2021-02-02 DIAGNOSIS — I10 Essential (primary) hypertension: Secondary | ICD-10-CM | POA: Diagnosis not present

## 2021-02-04 DIAGNOSIS — M48061 Spinal stenosis, lumbar region without neurogenic claudication: Secondary | ICD-10-CM | POA: Diagnosis not present

## 2021-02-14 DIAGNOSIS — E1142 Type 2 diabetes mellitus with diabetic polyneuropathy: Secondary | ICD-10-CM | POA: Diagnosis not present

## 2021-02-14 DIAGNOSIS — I10 Essential (primary) hypertension: Secondary | ICD-10-CM | POA: Diagnosis not present

## 2021-02-14 DIAGNOSIS — J449 Chronic obstructive pulmonary disease, unspecified: Secondary | ICD-10-CM | POA: Diagnosis not present

## 2021-02-21 DIAGNOSIS — G4733 Obstructive sleep apnea (adult) (pediatric): Secondary | ICD-10-CM | POA: Diagnosis not present

## 2021-02-28 DIAGNOSIS — Z1231 Encounter for screening mammogram for malignant neoplasm of breast: Secondary | ICD-10-CM | POA: Diagnosis not present

## 2021-03-03 DIAGNOSIS — I10 Essential (primary) hypertension: Secondary | ICD-10-CM | POA: Diagnosis not present

## 2021-03-06 ENCOUNTER — Ambulatory Visit: Payer: Self-pay

## 2021-03-06 ENCOUNTER — Other Ambulatory Visit: Payer: Self-pay

## 2021-03-06 ENCOUNTER — Ambulatory Visit (INDEPENDENT_AMBULATORY_CARE_PROVIDER_SITE_OTHER): Payer: Medicare Other | Admitting: Physician Assistant

## 2021-03-06 ENCOUNTER — Encounter: Payer: Self-pay | Admitting: Physician Assistant

## 2021-03-06 VITALS — Ht 62.0 in | Wt 284.0 lb

## 2021-03-06 DIAGNOSIS — M7061 Trochanteric bursitis, right hip: Secondary | ICD-10-CM | POA: Diagnosis not present

## 2021-03-06 DIAGNOSIS — M25551 Pain in right hip: Secondary | ICD-10-CM

## 2021-03-06 MED ORDER — METHYLPREDNISOLONE ACETATE 40 MG/ML IJ SUSP
40.0000 mg | INTRAMUSCULAR | Status: AC | PRN
  Administered 2021-03-06: 40 mg via INTRA_ARTICULAR

## 2021-03-06 MED ORDER — LIDOCAINE HCL 1 % IJ SOLN
3.0000 mL | INTRAMUSCULAR | Status: AC | PRN
  Administered 2021-03-06: 3 mL

## 2021-03-06 NOTE — Progress Notes (Signed)
   Procedure Note  Patient: Nancy Simmons             Date of Birth: 08/11/1955           MRN: [id number]             Visit Date: 03/06/2021 HPI: Nancy Simmons is well-known to our department service comes in today with right hip pain.  History of left total hip arthroplasty 12/10/2017.  Left hip surgery overall doing well.  She is having pain lateral aspect of the right hip past 3 to 4 months.  We saw her in September 2021 and she was given a right hip trochanteric injection which helped until recently.  She does have some groin pain.  She has had a history of back surgery and has had a stimulator appliance in July this year.  She is diabetic but reports good control and reports her glucose level this morning was less than 100.  No known injury to the right hip.  Review of systems: Denies any fevers, chills or recent vaccines.  Please see HPI otherwise negative or noncontributory.  Physical exam:  General: Well-developed well-nourished female no acute distress mood affect appropriate.  Right hip excellent range of motion.  Extremes of internal and external rotation cause pain.  Maximal tenderness over the lateral aspect of the hip trochanteric region.  No rashes or skin lesions right lateral hip.   Procedures: Visit Diagnoses:  1. Pain in right hip     Large Joint Inj: R greater trochanter on 03/06/2021 11:06 AM Indications: pain Details: 22 G 1.5 in needle, lateral approach  Arthrogram: No  Medications: 3 mL lidocaine 1 %; 40 mg methylPREDNISolone acetate 40 MG/ML Outcome: tolerated well, no immediate complications Procedure, treatment alternatives, risks and benefits explained, specific risks discussed. Consent was given by the patient. Immediately prior to procedure a time out was called to verify the correct patient, procedure, equipment, support staff and site/side marked as required. Patient was prepped and draped in the usual sterile fashion.     Plan: She shown IT band stretching  exercises.  She will follow-up with [place] in 2 weeks if her pain persist despite conservative measures.  Questions encouraged and answered.  Did discuss with her that she needs to monitor her glucose levels closely over the next 24 to 48 hours.

## 2021-03-07 DIAGNOSIS — Z79891 Long term (current) use of opiate analgesic: Secondary | ICD-10-CM | POA: Diagnosis not present

## 2021-03-07 DIAGNOSIS — R350 Frequency of micturition: Secondary | ICD-10-CM | POA: Diagnosis not present

## 2021-03-07 DIAGNOSIS — G8929 Other chronic pain: Secondary | ICD-10-CM | POA: Diagnosis not present

## 2021-03-07 DIAGNOSIS — Z79899 Other long term (current) drug therapy: Secondary | ICD-10-CM | POA: Diagnosis not present

## 2021-03-07 DIAGNOSIS — M25551 Pain in right hip: Secondary | ICD-10-CM | POA: Diagnosis not present

## 2021-03-07 DIAGNOSIS — M5136 Other intervertebral disc degeneration, lumbar region: Secondary | ICD-10-CM | POA: Diagnosis not present

## 2021-03-07 DIAGNOSIS — Z760 Encounter for issue of repeat prescription: Secondary | ICD-10-CM | POA: Diagnosis not present

## 2021-03-07 DIAGNOSIS — M48061 Spinal stenosis, lumbar region without neurogenic claudication: Secondary | ICD-10-CM | POA: Diagnosis not present

## 2021-03-07 DIAGNOSIS — I10 Essential (primary) hypertension: Secondary | ICD-10-CM | POA: Diagnosis not present

## 2021-03-07 DIAGNOSIS — M961 Postlaminectomy syndrome, not elsewhere classified: Secondary | ICD-10-CM | POA: Diagnosis not present

## 2021-03-07 DIAGNOSIS — Z9682 Presence of neurostimulator: Secondary | ICD-10-CM | POA: Diagnosis not present

## 2021-03-21 DIAGNOSIS — I10 Essential (primary) hypertension: Secondary | ICD-10-CM | POA: Diagnosis not present

## 2021-03-21 DIAGNOSIS — J449 Chronic obstructive pulmonary disease, unspecified: Secondary | ICD-10-CM | POA: Diagnosis not present

## 2021-03-21 DIAGNOSIS — E1142 Type 2 diabetes mellitus with diabetic polyneuropathy: Secondary | ICD-10-CM | POA: Diagnosis not present

## 2021-03-22 DIAGNOSIS — H52223 Regular astigmatism, bilateral: Secondary | ICD-10-CM | POA: Diagnosis not present

## 2021-03-22 DIAGNOSIS — I10 Essential (primary) hypertension: Secondary | ICD-10-CM | POA: Diagnosis not present

## 2021-03-22 DIAGNOSIS — E119 Type 2 diabetes mellitus without complications: Secondary | ICD-10-CM | POA: Diagnosis not present

## 2021-03-22 DIAGNOSIS — H25813 Combined forms of age-related cataract, bilateral: Secondary | ICD-10-CM | POA: Diagnosis not present

## 2021-03-22 DIAGNOSIS — H524 Presbyopia: Secondary | ICD-10-CM | POA: Diagnosis not present

## 2021-03-24 DIAGNOSIS — G4733 Obstructive sleep apnea (adult) (pediatric): Secondary | ICD-10-CM | POA: Diagnosis not present

## 2021-04-04 DIAGNOSIS — Z1211 Encounter for screening for malignant neoplasm of colon: Secondary | ICD-10-CM | POA: Diagnosis not present

## 2021-04-04 DIAGNOSIS — I10 Essential (primary) hypertension: Secondary | ICD-10-CM | POA: Diagnosis not present

## 2021-04-04 DIAGNOSIS — E559 Vitamin D deficiency, unspecified: Secondary | ICD-10-CM | POA: Diagnosis not present

## 2021-04-04 DIAGNOSIS — J449 Chronic obstructive pulmonary disease, unspecified: Secondary | ICD-10-CM | POA: Diagnosis not present

## 2021-04-04 DIAGNOSIS — M48061 Spinal stenosis, lumbar region without neurogenic claudication: Secondary | ICD-10-CM | POA: Diagnosis not present

## 2021-04-04 DIAGNOSIS — E785 Hyperlipidemia, unspecified: Secondary | ICD-10-CM | POA: Diagnosis not present

## 2021-04-07 DIAGNOSIS — M48061 Spinal stenosis, lumbar region without neurogenic claudication: Secondary | ICD-10-CM | POA: Diagnosis not present

## 2021-04-18 DIAGNOSIS — I10 Essential (primary) hypertension: Secondary | ICD-10-CM | POA: Diagnosis not present

## 2021-04-18 DIAGNOSIS — J449 Chronic obstructive pulmonary disease, unspecified: Secondary | ICD-10-CM | POA: Diagnosis not present

## 2021-04-18 DIAGNOSIS — E1142 Type 2 diabetes mellitus with diabetic polyneuropathy: Secondary | ICD-10-CM | POA: Diagnosis not present

## 2021-04-21 DIAGNOSIS — I10 Essential (primary) hypertension: Secondary | ICD-10-CM | POA: Diagnosis not present

## 2021-04-23 DIAGNOSIS — G4733 Obstructive sleep apnea (adult) (pediatric): Secondary | ICD-10-CM | POA: Diagnosis not present

## 2021-05-07 DIAGNOSIS — M48061 Spinal stenosis, lumbar region without neurogenic claudication: Secondary | ICD-10-CM | POA: Diagnosis not present

## 2021-05-16 DIAGNOSIS — J449 Chronic obstructive pulmonary disease, unspecified: Secondary | ICD-10-CM | POA: Diagnosis not present

## 2021-05-16 DIAGNOSIS — I10 Essential (primary) hypertension: Secondary | ICD-10-CM | POA: Diagnosis not present

## 2021-05-16 DIAGNOSIS — E1142 Type 2 diabetes mellitus with diabetic polyneuropathy: Secondary | ICD-10-CM | POA: Diagnosis not present

## 2021-05-17 DIAGNOSIS — R42 Dizziness and giddiness: Secondary | ICD-10-CM | POA: Diagnosis not present

## 2021-05-17 DIAGNOSIS — Z79899 Other long term (current) drug therapy: Secondary | ICD-10-CM | POA: Diagnosis not present

## 2021-05-17 DIAGNOSIS — N62 Hypertrophy of breast: Secondary | ICD-10-CM | POA: Diagnosis not present

## 2021-05-21 DIAGNOSIS — I10 Essential (primary) hypertension: Secondary | ICD-10-CM | POA: Diagnosis not present

## 2021-05-24 DIAGNOSIS — G4733 Obstructive sleep apnea (adult) (pediatric): Secondary | ICD-10-CM | POA: Diagnosis not present

## 2021-05-30 DIAGNOSIS — I10 Essential (primary) hypertension: Secondary | ICD-10-CM | POA: Diagnosis not present

## 2021-05-30 DIAGNOSIS — M545 Low back pain, unspecified: Secondary | ICD-10-CM | POA: Diagnosis not present

## 2021-05-30 DIAGNOSIS — M5136 Other intervertebral disc degeneration, lumbar region: Secondary | ICD-10-CM | POA: Diagnosis not present

## 2021-05-30 DIAGNOSIS — M5416 Radiculopathy, lumbar region: Secondary | ICD-10-CM | POA: Diagnosis not present

## 2021-06-06 DIAGNOSIS — Z760 Encounter for issue of repeat prescription: Secondary | ICD-10-CM | POA: Diagnosis not present

## 2021-06-06 DIAGNOSIS — M25551 Pain in right hip: Secondary | ICD-10-CM | POA: Diagnosis not present

## 2021-06-06 DIAGNOSIS — G8929 Other chronic pain: Secondary | ICD-10-CM | POA: Diagnosis not present

## 2021-06-06 DIAGNOSIS — Z79899 Other long term (current) drug therapy: Secondary | ICD-10-CM | POA: Diagnosis not present

## 2021-06-06 DIAGNOSIS — Z79891 Long term (current) use of opiate analgesic: Secondary | ICD-10-CM | POA: Diagnosis not present

## 2021-06-06 DIAGNOSIS — M48061 Spinal stenosis, lumbar region without neurogenic claudication: Secondary | ICD-10-CM | POA: Diagnosis not present

## 2021-06-06 DIAGNOSIS — Z5181 Encounter for therapeutic drug level monitoring: Secondary | ICD-10-CM | POA: Diagnosis not present

## 2021-06-06 DIAGNOSIS — M5136 Other intervertebral disc degeneration, lumbar region: Secondary | ICD-10-CM | POA: Diagnosis not present

## 2021-06-06 DIAGNOSIS — M961 Postlaminectomy syndrome, not elsewhere classified: Secondary | ICD-10-CM | POA: Diagnosis not present

## 2021-06-06 DIAGNOSIS — Z9682 Presence of neurostimulator: Secondary | ICD-10-CM | POA: Diagnosis not present

## 2021-06-07 DIAGNOSIS — M48061 Spinal stenosis, lumbar region without neurogenic claudication: Secondary | ICD-10-CM | POA: Diagnosis not present

## 2021-06-19 DIAGNOSIS — Z23 Encounter for immunization: Secondary | ICD-10-CM | POA: Diagnosis not present

## 2021-06-20 DIAGNOSIS — M545 Low back pain, unspecified: Secondary | ICD-10-CM | POA: Diagnosis not present

## 2021-06-21 DIAGNOSIS — I10 Essential (primary) hypertension: Secondary | ICD-10-CM | POA: Diagnosis not present

## 2021-06-21 DIAGNOSIS — E119 Type 2 diabetes mellitus without complications: Secondary | ICD-10-CM | POA: Diagnosis not present

## 2021-06-21 DIAGNOSIS — K219 Gastro-esophageal reflux disease without esophagitis: Secondary | ICD-10-CM | POA: Diagnosis not present

## 2021-06-21 DIAGNOSIS — K649 Unspecified hemorrhoids: Secondary | ICD-10-CM | POA: Diagnosis not present

## 2021-06-21 DIAGNOSIS — K5900 Constipation, unspecified: Secondary | ICD-10-CM | POA: Diagnosis not present

## 2021-06-21 DIAGNOSIS — Z7901 Long term (current) use of anticoagulants: Secondary | ICD-10-CM | POA: Diagnosis not present

## 2021-06-26 DIAGNOSIS — N390 Urinary tract infection, site not specified: Secondary | ICD-10-CM | POA: Diagnosis not present

## 2021-06-26 DIAGNOSIS — N3281 Overactive bladder: Secondary | ICD-10-CM | POA: Diagnosis not present

## 2021-06-26 DIAGNOSIS — R1084 Generalized abdominal pain: Secondary | ICD-10-CM | POA: Diagnosis not present

## 2021-06-27 DIAGNOSIS — M545 Low back pain, unspecified: Secondary | ICD-10-CM | POA: Diagnosis not present

## 2021-07-06 DIAGNOSIS — H6121 Impacted cerumen, right ear: Secondary | ICD-10-CM | POA: Diagnosis not present

## 2021-07-06 DIAGNOSIS — H903 Sensorineural hearing loss, bilateral: Secondary | ICD-10-CM | POA: Diagnosis not present

## 2021-07-06 DIAGNOSIS — H6981 Other specified disorders of Eustachian tube, right ear: Secondary | ICD-10-CM | POA: Diagnosis not present

## 2021-07-06 DIAGNOSIS — E119 Type 2 diabetes mellitus without complications: Secondary | ICD-10-CM | POA: Diagnosis not present

## 2021-07-06 DIAGNOSIS — H6591 Unspecified nonsuppurative otitis media, right ear: Secondary | ICD-10-CM | POA: Diagnosis not present

## 2021-07-06 DIAGNOSIS — H9193 Unspecified hearing loss, bilateral: Secondary | ICD-10-CM | POA: Diagnosis not present

## 2021-07-06 DIAGNOSIS — H61303 Acquired stenosis of external ear canal, unspecified, bilateral: Secondary | ICD-10-CM | POA: Diagnosis not present

## 2021-07-06 DIAGNOSIS — H9190 Unspecified hearing loss, unspecified ear: Secondary | ICD-10-CM | POA: Diagnosis not present

## 2021-07-19 DIAGNOSIS — E1165 Type 2 diabetes mellitus with hyperglycemia: Secondary | ICD-10-CM | POA: Diagnosis not present

## 2021-07-19 DIAGNOSIS — I10 Essential (primary) hypertension: Secondary | ICD-10-CM | POA: Diagnosis not present

## 2021-07-19 DIAGNOSIS — M48061 Spinal stenosis, lumbar region without neurogenic claudication: Secondary | ICD-10-CM | POA: Diagnosis not present

## 2021-07-19 DIAGNOSIS — E785 Hyperlipidemia, unspecified: Secondary | ICD-10-CM | POA: Diagnosis not present

## 2021-07-19 DIAGNOSIS — J449 Chronic obstructive pulmonary disease, unspecified: Secondary | ICD-10-CM | POA: Diagnosis not present

## 2021-07-19 DIAGNOSIS — M159 Polyosteoarthritis, unspecified: Secondary | ICD-10-CM | POA: Diagnosis not present

## 2021-07-19 DIAGNOSIS — Z79899 Other long term (current) drug therapy: Secondary | ICD-10-CM | POA: Diagnosis not present

## 2021-07-19 DIAGNOSIS — E559 Vitamin D deficiency, unspecified: Secondary | ICD-10-CM | POA: Diagnosis not present

## 2021-07-20 DIAGNOSIS — Z8669 Personal history of other diseases of the nervous system and sense organs: Secondary | ICD-10-CM | POA: Diagnosis not present

## 2021-07-20 DIAGNOSIS — H61303 Acquired stenosis of external ear canal, unspecified, bilateral: Secondary | ICD-10-CM | POA: Diagnosis not present

## 2021-07-20 DIAGNOSIS — Z789 Other specified health status: Secondary | ICD-10-CM | POA: Diagnosis not present

## 2021-07-22 DIAGNOSIS — I10 Essential (primary) hypertension: Secondary | ICD-10-CM | POA: Diagnosis not present

## 2022-03-08 ENCOUNTER — Other Ambulatory Visit: Payer: Self-pay | Admitting: Neurosurgery

## 2022-03-08 DIAGNOSIS — M5136 Other intervertebral disc degeneration, lumbar region: Secondary | ICD-10-CM

## 2022-04-05 ENCOUNTER — Ambulatory Visit
Admission: RE | Admit: 2022-04-05 | Discharge: 2022-04-05 | Disposition: A | Discharge status: Home / Self Care | Payer: 59 | Source: Ambulatory Visit | Attending: Neurosurgery | Admitting: Neurosurgery

## 2022-04-05 DIAGNOSIS — M5136 Other intervertebral disc degeneration, lumbar region: Secondary | ICD-10-CM

## 2022-04-05 MED ORDER — DIAZEPAM 5 MG PO TABS
5.0000 mg | ORAL_TABLET | Freq: Once | ORAL | Status: AC
  Administered 2022-04-05: 5 mg via ORAL

## 2022-04-05 MED ORDER — ONDANSETRON HCL 4 MG/2ML IJ SOLN
4.0000 mg | Freq: Once | INTRAMUSCULAR | Status: AC | PRN
  Administered 2022-04-05: 4 mg via INTRAMUSCULAR

## 2022-04-05 MED ORDER — IOPAMIDOL (ISOVUE-M 200) INJECTION 41%
15.0000 mL | Freq: Once | INTRAMUSCULAR | Status: AC
  Administered 2022-04-05: 15 mL via INTRATHECAL

## 2022-04-05 MED ORDER — MEPERIDINE HCL 50 MG/ML IJ SOLN
50.0000 mg | Freq: Once | INTRAMUSCULAR | Status: AC | PRN
  Administered 2022-04-05: 50 mg via INTRAMUSCULAR

## 2022-04-05 NOTE — Progress Notes (Signed)
Pt has spinal cord stimulator and reports she has turned it off for her CT myelogram procedure.  

## 2022-04-05 NOTE — Discharge Instructions (Signed)
Myelogram Discharge Instructions  Go home and rest quietly as needed. You may resume normal activities; however, do not exert yourself strongly or do any heavy lifting today and tomorrow.   DO NOT drive today.    You may resume your normal diet and medications unless otherwise indicated. Drink a lot of extra fluids today and tomorrow.   The incidence of a spinal headache (headache, nausea and/or vomiting is about 5% (one in 20 patients).  If you develop a headache when you are sitting up or standing that gets better when you lie down, please lie flat for 24 hours and drink plenty of fluids until the headache goes away.  Caffeinated beverages may be helpful.   If you develop severe nausea and vomiting or a headache that does not go away with the flat bedrest after 48 hours, please call [phone].   Call your physician for a follow-up appointment.  The results of your myelogram will be sent directly to your physician by the following day.  Please call us at [phone] if you have any questions or if complications develop after you arrive home.   Discharge instructions have been explained to the patient.  The patient, or the person responsible for the patient, state they fully understands these instructions.   Thank you for visiting our office today.   

## 2022-04-24 DIAGNOSIS — M4807 Spinal stenosis, lumbosacral region: Secondary | ICD-10-CM | POA: Diagnosis not present

## 2022-05-01 DIAGNOSIS — E119 Type 2 diabetes mellitus without complications: Secondary | ICD-10-CM | POA: Diagnosis not present

## 2022-05-01 DIAGNOSIS — H25813 Combined forms of age-related cataract, bilateral: Secondary | ICD-10-CM | POA: Diagnosis not present

## 2022-05-16 ENCOUNTER — Ambulatory Visit: Payer: Medicare Other | Admitting: Orthopaedic Surgery

## 2022-05-21 DIAGNOSIS — M47816 Spondylosis without myelopathy or radiculopathy, lumbar region: Secondary | ICD-10-CM | POA: Diagnosis not present

## 2022-05-21 DIAGNOSIS — M961 Postlaminectomy syndrome, not elsewhere classified: Secondary | ICD-10-CM | POA: Diagnosis not present

## 2022-05-21 DIAGNOSIS — Z760 Encounter for issue of repeat prescription: Secondary | ICD-10-CM | POA: Diagnosis not present

## 2022-05-21 DIAGNOSIS — G8929 Other chronic pain: Secondary | ICD-10-CM | POA: Diagnosis not present

## 2022-05-21 DIAGNOSIS — Z79891 Long term (current) use of opiate analgesic: Secondary | ICD-10-CM | POA: Diagnosis not present

## 2022-05-21 DIAGNOSIS — M48062 Spinal stenosis, lumbar region with neurogenic claudication: Secondary | ICD-10-CM | POA: Diagnosis not present

## 2022-05-21 DIAGNOSIS — Z79899 Other long term (current) drug therapy: Secondary | ICD-10-CM | POA: Diagnosis not present

## 2022-05-21 DIAGNOSIS — Z5181 Encounter for therapeutic drug level monitoring: Secondary | ICD-10-CM | POA: Diagnosis not present

## 2022-05-22 DIAGNOSIS — M159 Polyosteoarthritis, unspecified: Secondary | ICD-10-CM | POA: Diagnosis not present

## 2022-05-22 DIAGNOSIS — Z7409 Other reduced mobility: Secondary | ICD-10-CM | POA: Diagnosis not present

## 2022-05-22 DIAGNOSIS — Z789 Other specified health status: Secondary | ICD-10-CM | POA: Diagnosis not present

## 2022-06-22 DIAGNOSIS — E119 Type 2 diabetes mellitus without complications: Secondary | ICD-10-CM | POA: Diagnosis not present

## 2022-06-28 ENCOUNTER — Other Ambulatory Visit: Payer: Self-pay | Admitting: Neurosurgery

## 2022-08-01 ENCOUNTER — Other Ambulatory Visit (HOSPITAL_COMMUNITY): Payer: Medicare Other

## 2022-08-01 NOTE — Pre-Procedure Instructions (Signed)
Surgical Instructions    Your procedure is scheduled on Wednesday 08/08/22.   Report to [facility] Main Entrance "A" at 06:30 A.M., then check in with the Admitting office.  Call this number if you have problems the morning of surgery:  [phone]   If you have any questions prior to your surgery date call [phone]: Open Monday-Friday 8am-4pm If you experience any cold or flu symptoms such as cough, fever, chills, shortness of breath, etc. between now and your scheduled surgery, please notify us at the above number     Remember:  Do not eat or drink after midnight the night before your surgery   Take these medicines the morning of surgery with A SIP OF WATER:   amLODipine (NORVASC)   buPROPion (WELLBUTRIN XL)   docusate sodium (COLACE)   fenofibrate micronized (LOFIBRA)   fesoterodine (TOVIAZ)   gabapentin (NEURONTIN)   hydrALAZINE (APRESOLINE)   morphine (MSIR)   pantoprazole (PROTONIX)   pregabalin (LYRICA)   rosuvastatin (CRESTOR)  tiZANidine (ZANAFLEX)    Take these medicines if needed:   albuterol (PROVENTIL)   albuterol (VENTOLIN HFA)   fluticasone (FLONASE)   meclizine (ANTIVERT)   Tiotropium Bromide-Olodaterol    Please follow your surgeon's instructions to stop taking Eliquis and Aspirin 3 days prior to surgery.   Please follow your surgeon's instructions regarding azaTHIOprine (IMURAN). If you have not received instructions then please contact the office for instructions.   As of today, STOP taking Aleve, Naproxen, Ibuprofen, Motrin, Advil, Goody's, BC's, all herbal medications, fish oil, diclofenac sodium (VOLTAREN), meloxicam (MOBIC), and all vitamins.  WHAT DO I DO ABOUT MY DIABETES MEDICATION?   Do not take oral diabetes medicines (pills) the morning of surgery.  DO NOT TAKE metFORMIN (GLUCOPHAGE) the morning of surgery.   DO NOT TAKE liraglutide (VICTOZA) 24 hours prior to surgery.    The day of surgery, do not take other diabetes injectables,  including Byetta (exenatide), Bydureon (exenatide ER), Victoza (liraglutide), or Trulicity (dulaglutide).   HOW TO MANAGE YOUR DIABETES BEFORE AND AFTER SURGERY  Why is it important to control my blood sugar before and after surgery? Improving blood sugar levels before and after surgery helps healing and can limit problems. A way of improving blood sugar control is eating a healthy diet by:  Eating less sugar and carbohydrates  Increasing activity/exercise  Talking with your doctor about reaching your blood sugar goals High blood sugars (greater than 180 mg/dL) can raise your risk of infections and slow your recovery, so you will need to focus on controlling your diabetes during the weeks before surgery. Make sure that the doctor who takes care of your diabetes knows about your planned surgery including the date and location.  How do I manage my blood sugar before surgery? Check your blood sugar at least 4 times a day, starting 2 days before surgery, to make sure that the level is not too high or low.  Check your blood sugar the morning of your surgery when you wake up and every 2 hours until you get to the Short Stay unit.  If your blood sugar is less than 70 mg/dL, you will need to treat for low blood sugar: Do not take insulin. Treat a low blood sugar (less than 70 mg/dL) with  cup of clear juice (cranberry or apple), 4 glucose tablets, OR glucose gel. Recheck blood sugar in 15 minutes after treatment (to make sure it is greater than 70 mg/dL). If your blood sugar is not greater  than 70 mg/dL on recheck, call [phone] for further instructions. Report your blood sugar to the short stay nurse when you get to Short Stay.  If you are admitted to the hospital after surgery: Your blood sugar will be checked by the staff and you will probably be given insulin after surgery (instead of oral diabetes medicines) to make sure you have good blood sugar levels. The goal for blood sugar control  after surgery is 80-180 mg/dL.            Do not wear jewelry or makeup. Do not wear lotions, powders, perfumes/cologne or deodorant. Do not shave 48 hours prior to surgery.  Men may shave face and neck. Do not bring valuables to the hospital. Do not wear nail polish, gel polish, artificial nails, or any other type of covering on natural nails (fingers and toes) If you have artificial nails or gel coating that need to be removed by a nail salon, please have this removed prior to surgery. Artificial nails or gel coating may interfere with anesthesia's ability to adequately monitor your vital signs.  [facility] is not responsible for any belongings or valuables.    Do NOT Smoke (Tobacco/Vaping)  24 hours prior to your procedure  If you use a CPAP at night, you may bring your mask for your overnight stay.   Contacts, glasses, hearing aids, dentures or partials may not be worn into surgery, please bring cases for these belongings   For patients admitted to the hospital, discharge time will be determined by your treatment team.   Patients discharged the day of surgery will not be allowed to drive home, and someone needs to stay with them for 24 hours.   SURGICAL WAITING ROOM VISITATION Patients having surgery or a procedure may have no more than 2 support people in the waiting area - these visitors may rotate.   Children under the age of 1 must have an adult with them who is not the patient. If the patient needs to stay at the hospital during part of their recovery, the visitor guidelines for inpatient rooms apply. Pre-op nurse will coordinate an appropriate time for 1 support person to accompany patient in pre-op.  This support person may not rotate.   Please refer to [email or web address] for the visitor guidelines for Inpatients (after your surgery is over and you are in a regular room).    Special instructions:    Oral Hygiene is  also important to reduce your risk of infection.  Remember - BRUSH YOUR TEETH THE MORNING OF SURGERY WITH YOUR REGULAR TOOTHPASTE   [facility]- Preparing For Surgery  Before surgery, you can play an important role. Because skin is not sterile, your skin needs to be as free of germs as possible. You can reduce the number of germs on your skin by washing with CHG (chlorahexidine gluconate) Soap before surgery.  CHG is an antiseptic cleaner which kills germs and bonds with the skin to continue killing germs even after washing.     Please do not use if you have an allergy to CHG or antibacterial soaps. If your skin becomes reddened/irritated stop using the CHG.  Do not shave (including legs and underarms) for at least 48 hours prior to first CHG shower. It is OK to shave your face.  Please follow these instructions carefully.     Shower the NIGHT BEFORE SURGERY and the MORNING OF SURGERY with CHG Soap.   If you chose to wash your hair, wash your  hair first as usual with your normal shampoo. After you shampoo, rinse your hair and body thoroughly to remove the shampoo.  Then [employer] and genitals (private parts) with your normal soap and rinse thoroughly to remove soap.  After that Use CHG Soap as you would any other liquid soap. You can apply CHG directly to the skin and wash gently with a scrungie or a clean washcloth.   Apply the CHG Soap to your body ONLY FROM THE NECK DOWN.  Do not use on open wounds or open sores. Avoid contact with your eyes, ears, mouth and genitals (private parts). Wash Face and genitals (private parts)  with your normal soap.   Wash thoroughly, paying special attention to the area where your surgery will be performed.  Thoroughly rinse your body with warm water from the neck down.  DO NOT shower/wash with your normal soap after using and rinsing off the CHG Soap.  Pat yourself dry with a CLEAN TOWEL.  Wear CLEAN PAJAMAS to bed the night before surgery  Place  CLEAN SHEETS on your bed the night before your surgery  DO NOT SLEEP WITH PETS.   Day of Surgery:  Take a shower with CHG soap. Wear Clean/Comfortable clothing the morning of surgery Do not apply any deodorants/lotions.   Remember to brush your teeth WITH YOUR REGULAR TOOTHPASTE.    If you received a COVID test during your pre-op visit, it is requested that you wear a mask when out in public, stay away from anyone that may not be feeling well, and notify your surgeon if you develop symptoms. If you have been in contact with anyone that has tested positive in the last 10 days, please notify your surgeon.    Please read over the following fact sheets that you were given.

## 2022-08-02 ENCOUNTER — Other Ambulatory Visit: Payer: Self-pay

## 2022-08-02 ENCOUNTER — Encounter (HOSPITAL_COMMUNITY): Payer: Self-pay

## 2022-08-02 ENCOUNTER — Encounter (HOSPITAL_COMMUNITY)
Admission: RE | Admit: 2022-08-02 | Discharge: 2022-08-02 | Disposition: A | Discharge status: Home / Self Care | Payer: Medicare Other | Source: Ambulatory Visit | Attending: Neurosurgery | Admitting: Neurosurgery

## 2022-08-02 VITALS — BP 157/71 | HR 57 | Temp 98.1°F | Resp 19 | Ht 62.0 in | Wt 301.7 lb

## 2022-08-02 DIAGNOSIS — K759 Inflammatory liver disease, unspecified: Secondary | ICD-10-CM | POA: Diagnosis not present

## 2022-08-02 DIAGNOSIS — E119 Type 2 diabetes mellitus without complications: Secondary | ICD-10-CM | POA: Diagnosis not present

## 2022-08-02 DIAGNOSIS — Z01818 Encounter for other preprocedural examination: Secondary | ICD-10-CM

## 2022-08-02 LAB — COMPREHENSIVE METABOLIC PANEL
ALT: 29 U/L (ref 0–44)
AST: 36 U/L (ref 15–41)
Albumin: 3.4 g/dL — ABNORMAL LOW (ref 3.5–5.0)
Alkaline Phosphatase: 87 U/L (ref 38–126)
Anion gap: 8 (ref 5–15)
BUN: 12 mg/dL (ref 8–23)
CO2: 23 mmol/L (ref 22–32)
Calcium: 9.1 mg/dL (ref 8.9–10.3)
Chloride: 111 mmol/L (ref 98–111)
Creatinine, Ser: 0.62 mg/dL (ref 0.44–1.00)
GFR, Estimated: >60 mL/min (ref >60)
Glucose, Bld: 120 mg/dL — ABNORMAL HIGH (ref 70–99)
Potassium: 3.6 mmol/L (ref 3.5–5.1)
Sodium: 142 mmol/L (ref 135–145)
Total Bilirubin: 0.5 mg/dL (ref 0.3–1.2)
Total Protein: 7 g/dL (ref 6.5–8.1)

## 2022-08-02 LAB — CBC
HCT: 39.5 % (ref 36.0–46.0)
Hemoglobin: 11.9 g/dL — ABNORMAL LOW (ref 12.0–15.0)
MCH: 30 pg (ref 26.0–34.0)
MCHC: 30.1 g/dL (ref 30.0–36.0)
MCV: 99.5 fL (ref 80.0–100.0)
Platelets: 169 10*3/uL (ref 150–400)
RBC: 3.97 MIL/uL (ref 3.87–5.11)
RDW: 14.1 % (ref 11.5–15.5)
WBC: 5.8 10*3/uL (ref 4.0–10.5)
nRBC: 0 % (ref 0.0–0.2)

## 2022-08-02 LAB — HEMOGLOBIN A1C
Hgb A1c MFr Bld: 6.3 % — ABNORMAL HIGH (ref 4.8–5.6)
Mean Plasma Glucose: 134.11 mg/dL

## 2022-08-02 LAB — BASIC METABOLIC PANEL
Anion gap: 8 (ref 5–15)
BUN: 12 mg/dL (ref 8–23)
CO2: 24 mmol/L (ref 22–32)
Calcium: 9.2 mg/dL (ref 8.9–10.3)
Chloride: 110 mmol/L (ref 98–111)
Creatinine, Ser: 0.61 mg/dL (ref 0.44–1.00)
GFR, Estimated: >60 mL/min (ref >60)
Glucose, Bld: 122 mg/dL — ABNORMAL HIGH (ref 70–99)
Potassium: 3.6 mmol/L (ref 3.5–5.1)
Sodium: 142 mmol/L (ref 135–145)

## 2022-08-02 LAB — GLUCOSE, CAPILLARY: Glucose-Capillary: 121 mg/dL — ABNORMAL HIGH (ref 70–99)

## 2022-08-02 LAB — TYPE AND SCREEN
ABO/RH(D): O POS
Antibody Screen: NEGATIVE

## 2022-08-02 LAB — SURGICAL PCR SCREEN
MRSA, PCR: NEGATIVE
Staphylococcus aureus: POSITIVE — AB

## 2022-08-02 NOTE — Progress Notes (Addendum)
PCP - Dr. Jenean Lindau Cardiologist - Dr. Glenetta Hew  PPM/ICD - Denies  Chest x-ray - N/A EKG - 08/02/22 Stress Test - 04/02/17 ECHO - 04/02/17 Cardiac Cath - 04/05/17  Sleep Study - OSA CPAP - Yes  Fasting Blood Sugar - Patient doesn't check sugars before eating. Blood sugars after eating in the morning ranges from 98-120. Checks Blood Sugar __1___ time a day  Last dose of GLP1 agonist- LD should be on 08/06/22 GLP1 instructions: Stop 24 hours prior to surgery.  Blood Thinner Instructions: Patient told to stop 1 week prior to surgery. LD: 08/01/22 Aspirin Instructions:  ERAS Protcol - No  COVID TEST- N/A   Anesthesia review: Yes, cardiac hx  Patient denies shortness of breath, fever, cough and chest pain at PAT appointment   All instructions explained to the patient, with a verbal understanding of the material. Patient agrees to go over the instructions while at home for a better understanding. Patient also instructed to self quarantine after being tested for COVID-19. The opportunity to ask questions was provided.

## 2022-08-02 NOTE — Pre-Procedure Instructions (Addendum)
Surgical Instructions    Your procedure is scheduled on Wednesday 08/08/22.   Report to [facility] Main Entrance "A" at 06:30 A.M., then check in with the Admitting office.  Call this number if you have problems the morning of surgery:  [phone]   If you have any questions prior to your surgery date call [phone]: Open Monday-Friday 8am-4pm If you experience any cold or flu symptoms such as cough, fever, chills, shortness of breath, etc. between now and your scheduled surgery, please notify us at the above number     Remember:  Do not eat or drink after midnight the night before your surgery   Take these medicines the morning of surgery with A SIP OF WATER:   amLODipine (NORVASC)   buPROPion (WELLBUTRIN XL)   docusate sodium (COLACE)   fenofibrate micronized (LOFIBRA)   fesoterodine (TOVIAZ)   hydrALAZINE (APRESOLINE)   morphine (MSIR)   pantoprazole (PROTONIX)   pregabalin (LYRICA)   rosuvastatin (CRESTOR)  tiZANidine (ZANAFLEX)    Take these medicines if needed:   albuterol (PROVENTIL)   albuterol (VENTOLIN HFA)   fluticasone (FLONASE)   meclizine (ANTIVERT)   Tiotropium Bromide-Olodaterol    Please follow your surgeon's instructions to stop taking Eliquis and Aspirin 3 days prior to surgery. Your last dose will be on 08/04/22.  As of today, STOP taking Aleve, Naproxen, Ibuprofen, Motrin, Advil, Goody's, BC's, all herbal medications, fish oil, diclofenac sodium (VOLTAREN), meloxicam (MOBIC), and all vitamins.  WHAT DO I DO ABOUT MY DIABETES MEDICATION?   Do not take oral diabetes medicines (pills) the morning of surgery.  DO NOT TAKE metFORMIN (GLUCOPHAGE) the morning of surgery.   DO NOT TAKE liraglutide (VICTOZA) 24 hours prior to surgery. Last dose will be on 08/06/22.   The day of surgery, do not take other diabetes injectables, including Byetta (exenatide), Bydureon (exenatide ER), Victoza (liraglutide), or Trulicity (dulaglutide).   HOW TO MANAGE  YOUR DIABETES BEFORE AND AFTER SURGERY  Why is it important to control my blood sugar before and after surgery? Improving blood sugar levels before and after surgery helps healing and can limit problems. A way of improving blood sugar control is eating a healthy diet by:  Eating less sugar and carbohydrates  Increasing activity/exercise  Talking with your doctor about reaching your blood sugar goals High blood sugars (greater than 180 mg/dL) can raise your risk of infections and slow your recovery, so you will need to focus on controlling your diabetes during the weeks before surgery. Make sure that the doctor who takes care of your diabetes knows about your planned surgery including the date and location.  How do I manage my blood sugar before surgery? Check your blood sugar at least 4 times a day, starting 2 days before surgery, to make sure that the level is not too high or low.  Check your blood sugar the morning of your surgery when you wake up and every 2 hours until you get to the Short Stay unit.  If your blood sugar is less than 70 mg/dL, you will need to treat for low blood sugar: Do not take insulin. Treat a low blood sugar (less than 70 mg/dL) with  cup of clear juice (cranberry or apple), 4 glucose tablets, OR glucose gel. Recheck blood sugar in 15 minutes after treatment (to make sure it is greater than 70 mg/dL). If your blood sugar is not greater than 70 mg/dL on recheck, call [phone] for further instructions. Report your blood sugar to the  short stay nurse when you get to Short Stay.  If you are admitted to the hospital after surgery: Your blood sugar will be checked by the staff and you will probably be given insulin after surgery (instead of oral diabetes medicines) to make sure you have good blood sugar levels. The goal for blood sugar control after surgery is 80-180 mg/dL.            Do not wear jewelry or makeup. Do not wear lotions, powders, perfumes/cologne  or deodorant. Do not shave 48 hours prior to surgery.  Men may shave face and neck. Do not bring valuables to the hospital. Do not wear nail polish, gel polish, artificial nails, or any other type of covering on natural nails (fingers and toes) If you have artificial nails or gel coating that need to be removed by a nail salon, please have this removed prior to surgery. Artificial nails or gel coating may interfere with anesthesia's ability to adequately monitor your vital signs.  [facility] is not responsible for any belongings or valuables.    Do NOT Smoke (Tobacco/Vaping)  24 hours prior to your procedure  If you use a CPAP at night, you may bring your mask for your overnight stay.   Contacts, glasses, hearing aids, dentures or partials may not be worn into surgery, please bring cases for these belongings   For patients admitted to the hospital, discharge time will be determined by your treatment team.   Patients discharged the day of surgery will not be allowed to drive home, and someone needs to stay with them for 24 hours.   SURGICAL WAITING ROOM VISITATION Patients having surgery or a procedure may have no more than 2 support people in the waiting area - these visitors may rotate.   Children under the age of 1 must have an adult with them who is not the patient. If the patient needs to stay at the hospital during part of their recovery, the visitor guidelines for inpatient rooms apply. Pre-op nurse will coordinate an appropriate time for 1 support person to accompany patient in pre-op.  This support person may not rotate.   Please refer to [email or web address] for the visitor guidelines for Inpatients (after your surgery is over and you are in a regular room).    Special instructions:    Oral Hygiene is also important to reduce your risk of infection.  Remember - BRUSH YOUR TEETH THE MORNING OF SURGERY WITH YOUR REGULAR  TOOTHPASTE   [facility]- Preparing For Surgery  Before surgery, you can play an important role. Because skin is not sterile, your skin needs to be as free of germs as possible. You can reduce the number of germs on your skin by washing with CHG (chlorahexidine gluconate) Soap before surgery.  CHG is an antiseptic cleaner which kills germs and bonds with the skin to continue killing germs even after washing.     Please do not use if you have an allergy to CHG or antibacterial soaps. If your skin becomes reddened/irritated stop using the CHG.  Do not shave (including legs and underarms) for at least 48 hours prior to first CHG shower. It is OK to shave your face.  Please follow these instructions carefully.     Shower the NIGHT BEFORE SURGERY and the MORNING OF SURGERY with CHG Soap.   If you chose to wash your hair, wash your hair first as usual with your normal shampoo. After you shampoo, rinse your hair and body  thoroughly to remove the shampoo.  Then [employer] and genitals (private parts) with your normal soap and rinse thoroughly to remove soap.  After that Use CHG Soap as you would any other liquid soap. You can apply CHG directly to the skin and wash gently with a scrungie or a clean washcloth.   Apply the CHG Soap to your body ONLY FROM THE NECK DOWN.  Do not use on open wounds or open sores. Avoid contact with your eyes, ears, mouth and genitals (private parts). Wash Face and genitals (private parts)  with your normal soap.   Wash thoroughly, paying special attention to the area where your surgery will be performed.  Thoroughly rinse your body with warm water from the neck down.  DO NOT shower/wash with your normal soap after using and rinsing off the CHG Soap.  Pat yourself dry with a CLEAN TOWEL.  Wear CLEAN PAJAMAS to bed the night before surgery  Place CLEAN SHEETS on your bed the night before your surgery  DO NOT SLEEP WITH PETS.   Day of Surgery:  Take a shower  with CHG soap. Wear Clean/Comfortable clothing the morning of surgery Do not apply any deodorants/lotions.   Remember to brush your teeth WITH YOUR REGULAR TOOTHPASTE.    If you received a COVID test during your pre-op visit, it is requested that you wear a mask when out in public, stay away from anyone that may not be feeling well, and notify your surgeon if you develop symptoms. If you have been in contact with anyone that has tested positive in the last 10 days, please notify your surgeon.    Please read over the following fact sheets that you were given.

## 2022-08-07 ENCOUNTER — Telehealth: Payer: Self-pay

## 2022-08-07 NOTE — Telephone Encounter (Signed)
   Name: Nancy Simmons  DOB: Jun 06, 1956  MRN: [id number]  Primary Cardiologist: None  Chart reviewed as part of pre-operative protocol coverage. Because of [name]'s past medical history and time since last visit, she will require a follow-up in-office visit in order to better assess preoperative cardiovascular risk.  Pre-op covering staff: - Please schedule appointment and call patient to inform them. If patient already had an upcoming appointment within acceptable timeframe, please add "pre-op clearance" to the appointment notes so provider is aware. - Please contact requesting surgeon's office via preferred method (i.e, phone, fax) to inform them of need for appointment prior to surgery.  Guidance for Eliquis and aspirin should come from prescribing provider and not cardiology.  Mable Fill, Marissa Nestle, NP  08/07/2022, 1:07 PM

## 2022-08-07 NOTE — Telephone Encounter (Signed)
   Pre-operative Risk Assessment    Patient Name: Nancy Simmons  DOB: 09-30-1955 MRN: [id number]      Request for Surgical Clearance    Procedure:   L5-S1 LUMBAR FUSION  Date of Surgery:  Clearance 08/08/22                                 Surgeon:  JEFFREY JENKINS Surgeon's Group or Practice Name:  [facility]  Phone number:  [phone] Fax number:  [phone]    Type of Clearance Requested:   - Medical    Type of Anesthesia:  General    Additional requests/questions:    Altamese Cabal   08/07/2022, 12:56 PM

## 2022-08-07 NOTE — Telephone Encounter (Signed)
TRIED to call pt, Left message to call back. Needs an in-office appt for cardiac clearance. Called  [facility]  spoke with scheduler she states that she already knew and has rescheduled to 09-10-22.

## 2022-08-09 NOTE — Telephone Encounter (Signed)
Patient is returning call.

## 2022-08-09 NOTE — Telephone Encounter (Signed)
2nd attempt to reach pt regarding needing an in-office appointment for surgical clearance, left a message to call back.

## 2022-08-09 NOTE — Telephone Encounter (Signed)
Returned call to pt.  She is actually a new patient to the practice and there is not a new patient referral on file.  Pt is aware and will reach out to [facility] and I will route this back to them to make them aware.  Once referral has been received, can move forth with scheduling a appointment.

## 2022-08-14 DIAGNOSIS — M48061 Spinal stenosis, lumbar region without neurogenic claudication: Secondary | ICD-10-CM | POA: Diagnosis not present

## 2022-08-14 DIAGNOSIS — E785 Hyperlipidemia, unspecified: Secondary | ICD-10-CM | POA: Diagnosis not present

## 2022-08-14 DIAGNOSIS — J449 Chronic obstructive pulmonary disease, unspecified: Secondary | ICD-10-CM | POA: Diagnosis not present

## 2022-08-14 DIAGNOSIS — E1165 Type 2 diabetes mellitus with hyperglycemia: Secondary | ICD-10-CM | POA: Diagnosis not present

## 2022-08-14 DIAGNOSIS — I10 Essential (primary) hypertension: Secondary | ICD-10-CM | POA: Diagnosis not present

## 2022-08-15 NOTE — Telephone Encounter (Signed)
ADDENDUM: CORRECTIONS ON CLEARANCE THAT WAS ENTERED IN 08/07/22.     SURGERY DATE IN ENTERED IN CORRECTLY;  CORRECT SURGERY DATE IS 09/10/22  COMPLETE PROCEDURE TO BE DONE IS:  PLIF-POSTERIOR LATERAL AND INTERBODY FUSION, INTERBODY PROSTHESIS, POSTERIOR INSTRUMENTATION-L5-S1; EXPLORE LUMBAR FUSION  REQUEST IS FOR CARDIAC AND MEDICATION CLEARANCE: HOLD ELIQUIS x 6 DAYS PRIOR TO SURGERY   Pt is going to need a new pt appt as well. Message has been sent to chart prep and scheduling.

## 2022-08-16 NOTE — Telephone Encounter (Signed)
Pt has new pt appt 08/29/22 with Dr. Bettina Gavia. Once the pt has been cleared Dr. Bettina Gavia will have his nurse/cma fax clearance notes and any recommendations.

## 2022-08-20 DIAGNOSIS — G8929 Other chronic pain: Secondary | ICD-10-CM | POA: Diagnosis not present

## 2022-08-20 DIAGNOSIS — Z79891 Long term (current) use of opiate analgesic: Secondary | ICD-10-CM | POA: Diagnosis not present

## 2022-08-20 DIAGNOSIS — M961 Postlaminectomy syndrome, not elsewhere classified: Secondary | ICD-10-CM | POA: Diagnosis not present

## 2022-08-20 DIAGNOSIS — M25551 Pain in right hip: Secondary | ICD-10-CM | POA: Diagnosis not present

## 2022-08-20 DIAGNOSIS — Z96642 Presence of left artificial hip joint: Secondary | ICD-10-CM | POA: Diagnosis not present

## 2022-08-20 DIAGNOSIS — M48062 Spinal stenosis, lumbar region with neurogenic claudication: Secondary | ICD-10-CM | POA: Diagnosis not present

## 2022-08-20 DIAGNOSIS — Z760 Encounter for issue of repeat prescription: Secondary | ICD-10-CM | POA: Diagnosis not present

## 2022-08-22 ENCOUNTER — Other Ambulatory Visit: Payer: Self-pay | Admitting: Neurosurgery

## 2022-08-22 DIAGNOSIS — E1142 Type 2 diabetes mellitus with diabetic polyneuropathy: Secondary | ICD-10-CM | POA: Diagnosis not present

## 2022-08-22 DIAGNOSIS — I10 Essential (primary) hypertension: Secondary | ICD-10-CM | POA: Diagnosis not present

## 2022-08-27 DIAGNOSIS — Z87442 Personal history of urinary calculi: Secondary | ICD-10-CM | POA: Insufficient documentation

## 2022-08-27 DIAGNOSIS — Z9289 Personal history of other medical treatment: Secondary | ICD-10-CM | POA: Insufficient documentation

## 2022-08-27 DIAGNOSIS — M1990 Unspecified osteoarthritis, unspecified site: Secondary | ICD-10-CM | POA: Insufficient documentation

## 2022-08-27 DIAGNOSIS — Z8489 Family history of other specified conditions: Secondary | ICD-10-CM | POA: Insufficient documentation

## 2022-08-27 DIAGNOSIS — J449 Chronic obstructive pulmonary disease, unspecified: Secondary | ICD-10-CM | POA: Insufficient documentation

## 2022-08-27 DIAGNOSIS — K219 Gastro-esophageal reflux disease without esophagitis: Secondary | ICD-10-CM | POA: Insufficient documentation

## 2022-08-28 NOTE — Progress Notes (Unsigned)
Cardiology Office Note:    Date:  08/29/2022   ID:  Nancy Simmons, DOB Jul 13, 1956, MRN [id number]  PCP:  Helen Hashimoto., MD  Cardiologist:  Shirlee More, MD   Referring MD: Helen Hashimoto., MD  ASSESSMENT:    1. Preoperative cardiovascular examination   2. Coronary artery disease of bypass graft of native heart with stable angina pectoris ([place])   3. Primary hypertension   4. Hypercholesterolemia    PLAN:    In order of problems listed above:  Her procedure is intermediate risk for elective and from my perspective is a cardiologist she is optimized with mild nonobstructive CAD exercise tolerance of 4 METS or greater controlled hypertension and treated compliant obstructive sleep apnea Has been on long-term anticoagulation can discontinue preoperatively usually in the range of 3 days 6 doses of Eliquis but per neurosurgery the longer and I told her without be restarted postoperatively until safe from a bleeding perspective judged by her surgeon Postoperatively should go to a monitored bed EKG postoperative day 1 Continue her antihypertensives Lopressor and amlodipine perioperatively Continue her statin  Next appointment plan to see her back in the future as needed   Medication Adjustments/Labs and Tests Ordered: Current medicines are reviewed at length with the patient today.  Concerns regarding medicines are outlined above.  Orders Placed This Encounter  Procedures   EKG 12-Lead   No orders of the defined types were placed in this encounter.    Chief Complaint  Patient presents with   Pre-op Exam    Back Surgery  She has a history of mild nonobstructive CAD  History of Present Illness:    Nancy Simmons is a 67 y.o. female who is being seen today for preoperative cardiology evaluation at the request of Helen Hashimoto., MD. has a history of nonobstructive CAD by coronary angiography in 2018 hypertension hyperlipidemia obstructive sleep apnea and COPD.  She  has obstructive sleep apnea and wears her CPAP all night long and will bring her mask to the hospital with her She is not having any angina shortness of breath edema palpitation or syncope Despite her back pain and limitations her exercise tolerance is preserved 4 METS or greater and she does housework. Had upper extremity PICC line associated DVT in 2019 and has been maintained on long-term anticoagulation She has no history of resistant bacterial infection  This is a left heart catheterization 04/05/2017: Conclusion  The left ventricular systolic function is normal. The left ventricular ejection fraction is 55-65% by visual estimate. LV end diastolic pressure is mildly elevated. ___________________________________ LM lesion, 30 %stenosed. Ost LAD lesion, 30 %stenosed. Mid LAD lesion, 45 %stenosed. Dist LAD lesion, 50 %stenosed. - Combined FFR with these lesions was 0.87. Not physiologically significant. Prox RCA lesion, 30 %stenosed and the remainder of the mid vessel is diffusely diseased but mild. No significant flow-limiting lesions    She was previously seen by our practice [facility] 06/21/2018 when she presented to the hospital with chest pain.  Her history is significant for multiple medical comorbidities hypertension hyperlipidemia type 2 diabetes morbid obesity and a history of DVT.  SURGERY DATE IS 09/10/22   COMPLETE PROCEDURE TO BE DONE IS:  PLIF-POSTERIOR LATERAL AND INTERBODY FUSION, INTERBODY PROSTHESIS, POSTERIOR INSTRUMENTATION-L5-S1; EXPLORE LUMBAR FUSION   REQUEST IS FOR CARDIAC AND MEDICATION CLEARANCE: HOLD ELIQUIS x 6 DAYS PRIOR TO SURGERY   Past Medical History:  Diagnosis Date   Arthritis    "pretty much all over" (04/03/2017)  Asthma    Autoimmune hepatitis ([place])    COPD (chronic obstructive pulmonary disease) ([place])    Coronary artery disease    a. mild-mod by 03/2017 following false positive nuc - She subsequently underwent cath with 30% LM, 30% ostial LAD,  45% mLAD, 50% dLAD, 30% prox RCA, no flow limiting lesions, LVEF 55-65%.    Family history of adverse reaction to anesthesia    "granddaughter has PONV"   GERD (gastroesophageal reflux disease)    High cholesterol    History of blood transfusion    "in [place]; related to knee surgeries"   History of kidney stones    Hypertension    OSA on CPAP    CPAP, pressure settings 11   Type II diabetes mellitus ([place])     Past Surgical History:  Procedure Laterality Date   ABDOMINAL HYSTERECTOMY     BACK SURGERY     CARDIAC CATHETERIZATION     CARPAL TUNNEL RELEASE Bilateral    "done in [place]"   [place] Right 2015   S/P knee revision   INTRAVASCULAR PRESSURE WIRE/FFR STUDY N/A 04/05/2017   Procedure: INTRAVASCULAR PRESSURE WIRE/FFR STUDY;  Surgeon: Leonie Man, MD;  Location: [facility];  Service: Cardiovascular;  Laterality: N/A;   JOINT REPLACEMENT     LEFT HEART CATH AND CORONARY ANGIOGRAPHY N/A 04/05/2017   Procedure: LEFT HEART CATH AND CORONARY ANGIOGRAPHY;  Surgeon: Leonie Man, MD;  Location: [facility];  Service: Cardiovascular;  Laterality: N/A;   LUMBAR LAMINECTOMY/DECOMPRESSION MICRODISCECTOMY Left 05/22/2018   Procedure: MICRODISCECTOMY LUMBAR TWOLUMBAR THREE;  Surgeon: Newman Pies, MD;  Location: [facility];  Service: Neurosurgery;  Laterality: Left;   LUMBAR LAMINECTOMY/DECOMPRESSION MICRODISCECTOMY Left 09/03/2019   Procedure: LEFT LUMBAR TWO- LUMBAR THREE MICRODISCECTOMY;  Surgeon: Newman Pies, MD;  Location: [facility];  Service: Neurosurgery;  Laterality: Left;  MICRODISCECTOMY LEFT LUMBAR 2- LUMBAR 3   POSTERIOR FUSION LUMBAR SPINE  05/2010   REVISION TOTAL KNEE ARTHROPLASTY Right 2015   SHOULDER ARTHROSCOPY WITH ROTATOR CUFF REPAIR Right    SHOULDER OPEN ROTATOR CUFF REPAIR Left    "done in [place]"   TOTAL HIP ARTHROPLASTY Left 12/10/2017   Procedure: LEFT TOTAL HIP ARTHROPLASTY ANTERIOR APPROACH;  Surgeon:  Mcarthur Rossetti, MD;  Location: [facility];  Service: Orthopedics;  Laterality: Left;   TOTAL KNEE ARTHROPLASTY Bilateral 1990s    Current Medications: Current Meds  Medication Sig   albuterol (PROVENTIL) (2.5 MG/3ML) 0.083% nebulizer solution Take 2.5 mg by nebulization 3 (three) times daily.   albuterol (VENTOLIN HFA) 108 (90 Base) MCG/ACT inhaler Inhale 1-2 puffs into the lungs every 6 (six) hours as needed for wheezing or shortness of breath.   amLODipine (NORVASC) 5 MG tablet Take 5 mg by mouth daily.   apixaban (ELIQUIS) 5 MG TABS tablet Take 1 tablet (5 mg total) by mouth 2 (two) times daily. Restart on 09/07/2019   aspirin EC 81 MG tablet Take 1 tablet (81 mg total) by mouth daily. Restart on 09/07/2019   Choline Fenofibrate (TRILIPIX) 135 MG capsule Take 135 mg by mouth every evening.    diclofenac sodium (VOLTAREN) 1 % GEL Apply 1 application topically 4 (four) times daily as needed (for pain).   escitalopram (LEXAPRO) 10 MG tablet Take 10 mg by mouth daily.   fluticasone (FLONASE) 50 MCG/ACT nasal spray Place 2 sprays into both nostrils daily.   Glycopyrrolate-Formoterol (BEVESPI AEROSPHERE) 9-4.8 MCG/ACT AERO Inhale 2 puffs into the  lungs 2 (two) times daily.   hydrALAZINE (APRESOLINE) 10 MG tablet Take 10 mg by mouth 4 (four) times daily.   linaclotide (LINZESS) 145 MCG CAPS capsule Take 145 mcg by mouth daily before breakfast.   liraglutide (VICTOZA) 18 MG/3ML SOPN Inject 1.8 mg into the skin daily.   meclizine (ANTIVERT) 25 MG tablet Take 25 mg by mouth 3 (three) times daily as needed for dizziness.    meloxicam (MOBIC) 7.5 MG tablet Take 7.5 mg by mouth daily.   metoprolol tartrate (LOPRESSOR) 50 MG tablet Take 50 mg by mouth 2 (two) times daily.   morphine (MSIR) 15 MG tablet Take 15 mg by mouth 2 (two) times daily.   Multiple Vitamins-Minerals (MULTIVITAMIN GUMMIES WOMENS PO) Take 2 each by mouth daily.   Oxycodone HCl 10 MG TABS Take 1 tablet (10 mg total) by mouth every  4 (four) hours as needed ((score 7 to 10)).   pantoprazole (PROTONIX) 40 MG tablet Take 40 mg by mouth daily.   pregabalin (LYRICA) 100 MG capsule Take 100-200 mg by mouth See admin instructions. Taking 100 mg in the AM and 200 mg at bedtime   rOPINIRole (REQUIP) 1 MG tablet Take 1 mg by mouth at bedtime.   rosuvastatin (CRESTOR) 5 MG tablet Take 5 mg by mouth daily.   Suvorexant (BELSOMRA) 10 MG TABS Take 10 mg by mouth at bedtime as needed (sleep).   tiZANidine (ZANAFLEX) 2 MG tablet Take 1 tablet (2 mg total) by mouth every 8 (eight) hours as needed for muscle spasms.   umeclidinium-vilanterol (ANORO ELLIPTA) 62.5-25 MCG/ACT AEPB Inhale 1 puff into the lungs daily.   Vitamin D, Ergocalciferol, (DRISDOL) 1.25 MG (50000 UNIT) CAPS capsule Take 50,000 Units by mouth every 7 (seven) days. Sunday     Allergies:   Acetaminophen and Penicillin g   Social History   Socioeconomic History   Marital status: Single    Spouse name: Not on file   Number of children: Not on file   Years of education: Not on file   Highest education level: Not on file  Occupational History   Not on file  Tobacco Use   Smoking status: Former    Packs/day: 0.50    Years: 43.00    Total pack years: 21.50    Types: Cigarettes    Start date: 07/23/1972    Quit date: 12/06/2017    Years since quitting: 4.7   Smokeless tobacco: Never  Vaping Use   Vaping Use: Every day  Substance and Sexual Activity   Alcohol use: No    Alcohol/week: 0.0 standard drinks of alcohol   Drug use: Not Currently    Types: "Crack" cocaine    Comment: Remote h/o cocaine use   Sexual activity: Not Currently  Other Topics Concern   Not on file  Social History Narrative   Lives alone.  1 child.   Retired Presenter, broadcasting.     Social Determinants of Health   Financial Resource Strain: Not on file  Food Insecurity: Not on file  Transportation Needs: Not on file  Physical Activity: Not on file  Stress: Not on file  Social Connections:  Not on file     Family History: The patient's family history includes CAD in her brother, father, and mother; Colon cancer in an other family member; Diabetes in her brother and father; Heart disease in her brother and mother; Heart failure in her father; Stroke in an other family member.  ROS:   ROS Please  see the history of present illness.     All other systems reviewed and are negative.  EKGs/Labs/Other Studies Reviewed:    The following studies were reviewed today:  She had a left heart catheterization performed 04/05/2017 with normal ejection fraction 55 to 60% she had mild ostial mid and distal LAD disease or stenosis 50% and 30% proximal right coronary artery stenosis. EKG:  EKG is  ordered today.  The ekg ordered today is personally reviewed and demonstrates sinus rhythm low voltage in lead placement versus old anteroseptal MI QS waves V3 same pattern and 08/02/2012 and 2019  Recent Labs: 08/02/2022: ALT 29; BUN 12; BUN 12; Creatinine, Ser 0.61; Creatinine, Ser 0.62; Hemoglobin 11.9; Platelets 169; Potassium 3.6; Potassium 3.6; Sodium 142; Sodium 142  Recent Lipid Panel    Component Value Date/Time   CHOL 135 06/21/2018 0403   TRIG 117 06/21/2018 0403   HDL 30 (L) 06/21/2018 0403   CHOLHDL 4.5 06/21/2018 0403   VLDL 23 06/21/2018 0403   LDLCALC 82 06/21/2018 0403    Physical Exam:    VS:  BP 130/70 (BP Location: Left Arm, Patient Position: Sitting, Cuff Size: Large)   Pulse (!) 59   Ht 5\' 2"  (1.575 m)   Wt 300 lb (136.1 kg)   SpO2 92%   BMI 54.87 kg/m     Wt Readings from Last 3 Encounters:  08/29/22 300 lb (136.1 kg)  08/02/22 (!) 301 lb 11.2 oz (136.9 kg)  03/06/21 284 lb (128.8 kg)     GEN: Marked obesity BMI exceeds 54 well nourished, well developed in no acute distress HEENT: Normal NECK: No JVD; No carotid bruits LYMPHATICS: No lymphadenopathy CARDIAC: RRR, no murmurs, rubs, gallops RESPIRATORY:  Clear to auscultation without rales, wheezing or rhonchi   ABDOMEN: Soft, non-tender, non-distended MUSCULOSKELETAL:  No edema; No deformity  SKIN: Warm and dry NEUROLOGIC:  Alert and oriented x 3 PSYCHIATRIC:  Normal affect   Seen with 03/08/21, CMA chaperone    Signed, Sharmon Revere, MD  08/29/2022 4:10 PM    [facility]

## 2022-08-29 ENCOUNTER — Ambulatory Visit: Payer: 59 | Attending: Cardiology | Admitting: Cardiology

## 2022-08-29 ENCOUNTER — Encounter: Payer: Self-pay | Admitting: Cardiology

## 2022-08-29 VITALS — BP 130/70 | HR 59 | Ht 62.0 in | Wt 300.0 lb

## 2022-08-29 DIAGNOSIS — Z01810 Encounter for preprocedural cardiovascular examination: Secondary | ICD-10-CM | POA: Diagnosis not present

## 2022-08-29 DIAGNOSIS — I10 Essential (primary) hypertension: Secondary | ICD-10-CM | POA: Diagnosis not present

## 2022-08-29 DIAGNOSIS — I25708 Atherosclerosis of coronary artery bypass graft(s), unspecified, with other forms of angina pectoris: Secondary | ICD-10-CM

## 2022-08-29 DIAGNOSIS — E7800 Pure hypercholesterolemia, unspecified: Secondary | ICD-10-CM | POA: Diagnosis not present

## 2022-08-29 NOTE — Patient Instructions (Signed)
Medication Instructions:  Your physician recommends that you continue on your current medications as directed. Please refer to the Current Medication list given to you today.  *If you need a refill on your cardiac medications before your next appointment, please call your pharmacy*   Lab Work: None If you have labs (blood work) drawn today and your tests are completely normal, you will receive your results only by: MyChart Message (if you have MyChart) OR A paper copy in the mail If you have any lab test that is abnormal or we need to change your treatment, we will call you to review the results.   Testing/Procedures: None   Follow-Up: At [facility], you and your health needs are our priority.  As part of our continuing mission to provide you with exceptional heart care, we have created designated Provider Care Teams.  These Care Teams include your primary Cardiologist (physician) and Advanced Practice Providers (APPs -  Physician Assistants and Nurse Practitioners) who all work together to provide you with the care you need, when you need it.  We recommend signing up for the patient portal called "MyChart".  Sign up information is provided on this After Visit Summary.  MyChart is used to connect with patients for Virtual Visits (Telemedicine).  Patients are able to view lab/test results, encounter notes, upcoming appointments, etc.  Non-urgent messages can be sent to your provider as well.   To learn more about what you can do with MyChart, go to [email or web address].    Your next appointment:   Follow up as needed   Provider:   Brian Munley, MD    Other Instructions None  This visit was accompanied by Tiffany Garner.   

## 2022-08-31 NOTE — Progress Notes (Signed)
Surgical Instructions    Your procedure is scheduled on Monday, 09/10/22.  Report to [facility] Main Entrance "A" at 9:30 A.M., then check in with the Admitting office.  Call this number if you have problems the morning of surgery:  [phone]   If you have any questions prior to your surgery date call [phone]: Open Monday-Friday 8am-4pm If you experience any cold or flu symptoms such as cough, fever, chills, shortness of breath, etc. between now and your scheduled surgery, please notify us at the above number     Remember:  Do not eat or drink after midnight the night before your surgery     Take these medicines the morning of surgery with A SIP OF WATER:  albuterol (PROVENTIL) (2.5 MG/3ML) nebulizer amLODipine (NORVASC)  escitalopram (LEXAPRO)  fluticasone (FLONASE)  Glycopyrrolate-Formoterol (BEVESPI AEROSPHERE)  hydrALAZINE (APRESOLINE)  linaclotide (LINZESS)  metoprolol tartrate (LOPRESSOR)  morphine (MSIR)  pantoprazole (PROTONIX)  pregabalin (LYRICA)  rosuvastatin (CRESTOR)  umeclidinium-vilanterol (ANORO ELLIPTA)   IF NEEDED: albuterol (VENTOLIN HFA) inhaler- bring with you the day of surgery meclizine (ANTIVERT)  Oxycodone HCl  tiZANidine (ZANAFLEX)   As of today, STOP taking any Aspirin (unless otherwise instructed by your surgeon) Aleve, Naproxen, Ibuprofen, Motrin, Advil, Goody's, BC's, all herbal medications, fish oil, diclofenac sodium (VOLTAREN) gel, meloxicam (MOBIC) and all vitamins.  Please stop taking apixaban (ELIQUIS) 3 days prior to surgery. Your last dose will be 09/06/22.  WHAT DO I DO ABOUT MY DIABETES MEDICATION?   Do not take oral diabetes medicines (pills) the morning of surgery.  Do not take liraglutide (VICTOZA) one day prior to surgery. Your last dose will be 09/08/22.  The day of surgery, do not take other diabetes injectables, including Byetta (exenatide), Bydureon (exenatide ER), Victoza (liraglutide), or Trulicity  (dulaglutide).  If your CBG is greater than 220 mg/dL, you may take  of your sliding scale (correction) dose of insulin.   HOW TO MANAGE YOUR DIABETES BEFORE AND AFTER SURGERY  Why is it important to control my blood sugar before and after surgery? Improving blood sugar levels before and after surgery helps healing and can limit problems. A way of improving blood sugar control is eating a healthy diet by:  Eating less sugar and carbohydrates  Increasing activity/exercise  Talking with your doctor about reaching your blood sugar goals High blood sugars (greater than 180 mg/dL) can raise your risk of infections and slow your recovery, so you will need to focus on controlling your diabetes during the weeks before surgery. Make sure that the doctor who takes care of your diabetes knows about your planned surgery including the date and location.  How do I manage my blood sugar before surgery? Check your blood sugar at least 4 times a day, starting 2 days before surgery, to make sure that the level is not too high or low.  Check your blood sugar the morning of your surgery when you wake up and every 2 hours until you get to the Short Stay unit.  If your blood sugar is less than 70 mg/dL, you will need to treat for low blood sugar: Do not take insulin. Treat a low blood sugar (less than 70 mg/dL) with  cup of clear juice (cranberry or apple), 4 glucose tablets, OR glucose gel. Recheck blood sugar in 15 minutes after treatment (to make sure it is greater than 70 mg/dL). If your blood sugar is not greater than 70 mg/dL on recheck, call [phone] for further instructions. Report your blood  sugar to the short stay nurse when you get to Short Stay.  If you are admitted to the hospital after surgery: Your blood sugar will be checked by the staff and you will probably be given insulin after surgery (instead of oral diabetes medicines) to make sure you have good blood sugar levels. The goal for  blood sugar control after surgery is 80-180 mg/dL.           Do not wear jewelry or makeup. Do not wear lotions, powders, perfumes or deodorant. Do not shave 48 hours prior to surgery.   Do not bring valuables to the hospital. Do not wear nail polish, gel polish, artificial nails, or any other type of covering on natural nails (fingers and toes) If you have artificial nails or gel coating that need to be removed by a nail salon, please have this removed prior to surgery. Artificial nails or gel coating may interfere with anesthesia's ability to adequately monitor your vital signs.  [facility] is not responsible for any belongings or valuables.    Do NOT Smoke (Tobacco/Vaping)  24 hours prior to your procedure  If you use a CPAP at night, you may bring your mask for your overnight stay.   Contacts, glasses, hearing aids, dentures or partials may not be worn into surgery, please bring cases for these belongings   For patients admitted to the hospital, discharge time will be determined by your treatment team.   Patients discharged the day of surgery will not be allowed to drive home, and someone needs to stay with them for 24 hours.   SURGICAL WAITING ROOM VISITATION Patients having surgery or a procedure may have no more than 2 support people in the waiting area - these visitors may rotate.   Children under the age of 41 must have an adult with them who is not the patient. If the patient needs to stay at the hospital during part of their recovery, the visitor guidelines for inpatient rooms apply. Pre-op nurse will coordinate an appropriate time for 1 support person to accompany patient in pre-op.  This support person may not rotate.   Please refer to [email or web address] for the visitor guidelines for Inpatients (after your surgery is over and you are in a regular room).    Special instructions:    Oral Hygiene is also important to  reduce your risk of infection.  Remember - BRUSH YOUR TEETH THE MORNING OF SURGERY WITH YOUR REGULAR TOOTHPASTE   [place]- Preparing For Surgery  Before surgery, you can play an important role. Because skin is not sterile, your skin needs to be as free of germs as possible. You can reduce the number of germs on your skin by washing with CHG (chlorahexidine gluconate) Soap before surgery.  CHG is an antiseptic cleaner which kills germs and bonds with the skin to continue killing germs even after washing.     Please do not use if you have an allergy to CHG or antibacterial soaps. If your skin becomes reddened/irritated stop using the CHG.  Do not shave (including legs and underarms) for at least 48 hours prior to first CHG shower. It is OK to shave your face.  Please follow these instructions carefully.     Shower the NIGHT BEFORE SURGERY and the MORNING OF SURGERY with CHG Soap.   If you chose to wash your hair, wash your hair first as usual with your normal shampoo. After you shampoo, rinse your hair and body thoroughly to remove  the shampoo.  Then [employer] and genitals (private parts) with your normal soap and rinse thoroughly to remove soap.  After that Use CHG Soap as you would any other liquid soap. You can apply CHG directly to the skin and wash gently with a scrungie or a clean washcloth.   Apply the CHG Soap to your body ONLY FROM THE NECK DOWN.  Do not use on open wounds or open sores. Avoid contact with your eyes, ears, mouth and genitals (private parts). Wash Face and genitals (private parts)  with your normal soap.   Wash thoroughly, paying special attention to the area where your surgery will be performed.  Thoroughly rinse your body with warm water from the neck down.  DO NOT shower/wash with your normal soap after using and rinsing off the CHG Soap.  Pat yourself dry with a CLEAN TOWEL.  Wear CLEAN PAJAMAS to bed the night before surgery  Place CLEAN SHEETS on your  bed the night before your surgery  DO NOT SLEEP WITH PETS.   Day of Surgery: Take a shower with CHG soap. Wear Clean/Comfortable clothing the morning of surgery Do not apply any deodorants/lotions.   Remember to brush your teeth WITH YOUR REGULAR TOOTHPASTE.    If you received a COVID test during your pre-op visit, it is requested that you wear a mask when out in public, stay away from anyone that may not be feeling well, and notify your surgeon if you develop symptoms. If you have been in contact with anyone that has tested positive in the last 10 days, please notify your surgeon.    Please read over the following fact sheets that you were given.

## 2022-09-03 ENCOUNTER — Encounter (HOSPITAL_COMMUNITY): Payer: Self-pay

## 2022-09-03 ENCOUNTER — Encounter (HOSPITAL_COMMUNITY)
Admission: RE | Admit: 2022-09-03 | Discharge: 2022-09-03 | Disposition: A | Discharge status: Home / Self Care | Payer: 59 | Source: Ambulatory Visit | Attending: Neurosurgery | Admitting: Neurosurgery

## 2022-09-03 ENCOUNTER — Other Ambulatory Visit: Payer: Self-pay

## 2022-09-03 VITALS — BP 164/90 | HR 96 | Temp 98.0°F | Resp 18 | Ht 62.0 in | Wt 302.2 lb

## 2022-09-03 DIAGNOSIS — Z01818 Encounter for other preprocedural examination: Secondary | ICD-10-CM

## 2022-09-03 DIAGNOSIS — E119 Type 2 diabetes mellitus without complications: Secondary | ICD-10-CM | POA: Diagnosis not present

## 2022-09-03 DIAGNOSIS — Z01812 Encounter for preprocedural laboratory examination: Secondary | ICD-10-CM | POA: Insufficient documentation

## 2022-09-03 DIAGNOSIS — K754 Autoimmune hepatitis: Secondary | ICD-10-CM

## 2022-09-03 LAB — SURGICAL PCR SCREEN
MRSA, PCR: NEGATIVE
Staphylococcus aureus: POSITIVE — AB

## 2022-09-03 LAB — TYPE AND SCREEN
ABO/RH(D): O POS
Antibody Screen: NEGATIVE

## 2022-09-03 NOTE — Progress Notes (Signed)
PCP - Annie Main campbell Cardiologist - Shirlee More  PPM/ICD - denies   Chest x-ray - n/a EKG - 08/02/22 Stress Test - denies ECHO - denies Cardiac Cath - 04/05/17  Sleep Study - +OSA CPAP - wears nightly  Fasting Blood Sugar - 90-120 Checks Blood Sugar 1-2 times a day  Last dose of GLP1 agonist-  09/08/22 GLP1 instructions: stop victoza 24 hours prior to surgery  As of today, STOP taking any Aspirin (unless otherwise instructed by your surgeon) Aleve, Naproxen, Ibuprofen, Motrin, Advil, Goody's, BC's, all herbal medications, fish oil, diclofenac sodium (VOLTAREN) gel, meloxicam (MOBIC) and all vitamins.   Please stop taking apixaban (ELIQUIS) 3 days prior to surgery. Your last dose will be 09/06/22.  ERAS Protcol -no   COVID TEST- not needed   Anesthesia review: yes, review cath and cardiac clearance  Patient denies shortness of breath, fever, cough and chest pain at PAT appointment   All instructions explained to the patient, with a verbal understanding of the material. Patient agrees to go over the instructions while at home for a better understanding. Patient also instructed to self quarantine after being tested for COVID-19. The opportunity to ask questions was provided.

## 2022-09-04 NOTE — Progress Notes (Signed)
Anesthesia Chart Review:  Follows with cardiology for history of nonobstructive CAD by cath 2018, HTN, HLD, OSA on CPAP.  She was seen by Dr. Bettina Gavia on 08/29/2022 for preop evaluation.  Per note, "Her procedure is intermediate risk for elective and from my perspective is a cardiologist she is optimized with mild nonobstructive CAD exercise tolerance of 4 METS or greater controlled hypertension and treated compliant obstructive sleep apnea."  History of PICC line associated DVT in 2019 and has been maintained on long-term anticoagulation.  Last dose Eliquis 09/06/2022.  Former smoker with associated COPD, states she is maintained on both an oral Ellipta and [place].  History of autoimmune hepatitis diagnosed in 2014, followed at [facility].  She was previously on azathioprine, however appears she has been off this for the past ~6 months due to being overdue for followup. Hepatic function normal on CMP 03/27/2022 and 08/02/2022.  Non-insulin-dependent DM2, well-controlled, A1c 6.3 on preop labs.  She is on daily GLP-1 agonist Victoza, instructed to hold 24 hours prior to surgery.  CBC from 08/02/2022 reviewed, mild anemia with hemoglobin 11.9.  CMP from 08/02/2022 reviewed, WNL.  EKG 08/29/2022 (read per Dr. Joya Gaskins note same date, tracing not yet available in epic):The ekg ordered today is personally reviewed and demonstrates sinus rhythm low voltage in lead placement versus old anteroseptal MI QS waves V3 same pattern and 08/02/2012 and 2019   Cath 04/05/2017: The left ventricular systolic function is normal. The left ventricular ejection fraction is 55-65% by visual estimate. LV end diastolic pressure is mildly elevated. ___________________________________ LM lesion, 30 %stenosed. Ost LAD lesion, 30 %stenosed. Mid LAD lesion, 45 %stenosed. Dist LAD lesion, 50 %stenosed. - Combined FFR with these lesions was 0.87. Not physiologically significant. Prox RCA lesion, 30 %stenosed and the remainder of the  mid vessel is diffusely diseased but mild. No significant flow-limiting lesions   No flow-limiting lesions takes when the patient several stress test. The 2 lesions in the LAD are angiographically not significant and not significant by FFR.  Either of these 2 lesions could potentially be a focal site for coronary spasm however.   Her stress test would be considered false positive.   Recommendation: Considered other noncardiac etiology for chest pain versus potential coronary spasm.   Would be stable for discharge from a cardiac Standpoint after bedrest. Defer to primary team & consult service.    Wynonia Musty [facility] Short Stay [facility]/Anesthesiology Phone [phone] 09/04/2022 2:10 PM

## 2022-09-04 NOTE — Anesthesia Preprocedure Evaluation (Addendum)
Anesthesia Evaluation  Patient identified by MRN, date of birth, ID band Patient awake    Reviewed: Allergy & Precautions, NPO status , Patient's Chart, lab work & pertinent test results, reviewed documented beta blocker date and time   History of Anesthesia Complications (+) Family history of anesthesia reaction  Airway Mallampati: III  TM Distance: >3 FB Neck ROM: Full    Dental  (+) Dental Advisory Given, Poor Dentition, Missing,    Pulmonary asthma , sleep apnea and Continuous Positive Airway Pressure Ventilation , COPD,  COPD inhaler, Patient abstained from smoking., former smoker   Pulmonary exam normal        Cardiovascular hypertension, Pt. on medications and Pt. on home beta blockers + CAD  Normal cardiovascular exam     Neuro/Psych  PSYCHIATRIC DISORDERS  Depression       GI/Hepatic ,GERD  Medicated,,(+) Hepatitis -, Autoimmune  Endo/Other  diabetes, Type 2  Morbid obesity  Renal/GU   negative genitourinary   Musculoskeletal   Abdominal  (+) + obese  Peds  Hematology   Anesthesia Other Findings   Reproductive/Obstetrics                             Anesthesia Physical Anesthesia Plan  ASA: III  Anesthesia Plan: General   Post-op Pain Management: Dilaudid IV   Induction: Intravenous  PONV Risk Score and Plan: 4 or greater and Ondansetron, Dexamethasone and Midazolam  Airway Management Planned: Oral ETT and Video Laryngoscope Planned  Additional Equipment: None  Intra-op Plan:   Post-operative Plan: Extubation in OR  Informed Consent: I have reviewed the patients History and Physical, chart, labs and discussed the procedure including the risks, benefits and alternatives for the proposed anesthesia with the patient or authorized representative who has indicated his/her understanding and acceptance.     Dental advisory given  Plan Discussed with: CRNA  Anesthesia  Plan Comments: (PAT note by Karoline Caldwell, PA-C: Follows with cardiology for history of nonobstructive CAD by cath 2018, HTN, HLD, OSA on CPAP.  She was seen by Dr. Bettina Gavia on 08/29/2022 for preop evaluation.  Per note, "Her procedure is intermediate risk for elective and from my perspective is a cardiologist she is optimized with mild nonobstructive CAD exercise tolerance of 4 METS or greater controlled hypertension and treated compliant obstructive sleep apnea."  History of PICC line associated DVT in 2019 and has been maintained on long-term anticoagulation.  Last dose Eliquis 09/06/2022.  Former smoker with associated COPD, states she is maintained on both an oral Ellipta and [place].  History of autoimmune hepatitis diagnosed in 2014, followed at [facility].  She was previously on azathioprine, however appears she has been off this for the past ~6 months due to being overdue for followup. Hepatic function normal on CMP 03/27/2022 and 08/02/2022.  Non-insulin-dependent DM2, well-controlled, A1c 6.3 on preop labs.  She is on daily GLP-1 agonist Victoza, instructed to hold 24 hours prior to surgery.  CBC from 08/02/2022 reviewed, mild anemia with hemoglobin 11.9.  CMP from 08/02/2022 reviewed, WNL.  EKG 08/29/2022 (read per Dr. 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 The left ventricular systolic function is normal. The left ventricular ejection fraction is 55-65% by visual estimate. ? LV end diastolic pressure  is mildly elevated. ? ___________________________________ ? LM lesion, 30 %stenosed. ? Ost LAD lesion, 30 %stenosed. Mid LAD lesion, 45 %stenosed. Dist LAD lesion, 50 %stenosed. - Combined FFR with these lesions was 0.87. Not physiologically significant. ? Prox RCA lesion, 30  %stenosed and the remainder of the mid vessel is diffusely diseased but mild. No significant flow-limiting lesions  No flow-limiting lesions takes when the patient several stress test. The 2 lesions in the LAD are angiographically not significant and not significant by FFR. Either of these 2 lesions could potentially be a focal site for coronary spasm however.  Her stress test would be considered false positive.  Recommendation: Considered other noncardiac etiology for chest pain versus potential coronary spasm.  Would be stable for discharge from a cardiac Standpoint after bedrest. Defer to primary team & consult service.   )        Anesthesia Quick Evaluation

## 2022-09-10 ENCOUNTER — Inpatient Hospital Stay (HOSPITAL_COMMUNITY): Payer: 59 | Admitting: Physician Assistant

## 2022-09-10 ENCOUNTER — Other Ambulatory Visit: Payer: Self-pay

## 2022-09-10 ENCOUNTER — Encounter (HOSPITAL_COMMUNITY)
Admission: RE | Disposition: A | Discharge status: Skilled Nursing Facility | Payer: Self-pay | Source: Home / Self Care | Attending: Neurosurgery

## 2022-09-10 ENCOUNTER — Inpatient Hospital Stay (HOSPITAL_COMMUNITY): Payer: 59

## 2022-09-10 ENCOUNTER — Inpatient Hospital Stay (HOSPITAL_COMMUNITY)
Admission: RE | Admit: 2022-09-10 | Discharge: 2022-09-14 | DRG: 454 | Disposition: A | Discharge status: Skilled Nursing Facility | Payer: 59 | Attending: Neurosurgery | Admitting: Neurosurgery

## 2022-09-10 ENCOUNTER — Encounter (HOSPITAL_COMMUNITY): Payer: Self-pay | Admitting: Neurosurgery

## 2022-09-10 DIAGNOSIS — Z7984 Long term (current) use of oral hypoglycemic drugs: Secondary | ICD-10-CM

## 2022-09-10 DIAGNOSIS — M4727 Other spondylosis with radiculopathy, lumbosacral region: Secondary | ICD-10-CM

## 2022-09-10 DIAGNOSIS — M5117 Intervertebral disc disorders with radiculopathy, lumbosacral region: Secondary | ICD-10-CM | POA: Diagnosis not present

## 2022-09-10 DIAGNOSIS — M4807 Spinal stenosis, lumbosacral region: Secondary | ICD-10-CM | POA: Diagnosis present

## 2022-09-10 DIAGNOSIS — Z886 Allergy status to analgesic agent status: Secondary | ICD-10-CM

## 2022-09-10 DIAGNOSIS — M5116 Intervertebral disc disorders with radiculopathy, lumbar region: Secondary | ICD-10-CM | POA: Diagnosis not present

## 2022-09-10 DIAGNOSIS — K219 Gastro-esophageal reflux disease without esophagitis: Secondary | ICD-10-CM | POA: Diagnosis present

## 2022-09-10 DIAGNOSIS — I2510 Atherosclerotic heart disease of native coronary artery without angina pectoris: Secondary | ICD-10-CM | POA: Diagnosis present

## 2022-09-10 DIAGNOSIS — Z981 Arthrodesis status: Principal | ICD-10-CM

## 2022-09-10 DIAGNOSIS — Z833 Family history of diabetes mellitus: Secondary | ICD-10-CM | POA: Diagnosis not present

## 2022-09-10 DIAGNOSIS — Z880 Allergy status to penicillin: Secondary | ICD-10-CM

## 2022-09-10 DIAGNOSIS — Z87891 Personal history of nicotine dependence: Secondary | ICD-10-CM

## 2022-09-10 DIAGNOSIS — N390 Urinary tract infection, site not specified: Secondary | ICD-10-CM | POA: Diagnosis not present

## 2022-09-10 DIAGNOSIS — M6281 Muscle weakness (generalized): Secondary | ICD-10-CM | POA: Diagnosis not present

## 2022-09-10 DIAGNOSIS — E7800 Pure hypercholesterolemia, unspecified: Secondary | ICD-10-CM | POA: Diagnosis not present

## 2022-09-10 DIAGNOSIS — Z791 Long term (current) use of non-steroidal anti-inflammatories (NSAID): Secondary | ICD-10-CM

## 2022-09-10 DIAGNOSIS — Z23 Encounter for immunization: Secondary | ICD-10-CM | POA: Diagnosis not present

## 2022-09-10 DIAGNOSIS — E119 Type 2 diabetes mellitus without complications: Secondary | ICD-10-CM

## 2022-09-10 DIAGNOSIS — Z6841 Body Mass Index (BMI) 40.0 and over, adult: Secondary | ICD-10-CM | POA: Diagnosis not present

## 2022-09-10 DIAGNOSIS — G4733 Obstructive sleep apnea (adult) (pediatric): Secondary | ICD-10-CM | POA: Diagnosis not present

## 2022-09-10 DIAGNOSIS — Z8249 Family history of ischemic heart disease and other diseases of the circulatory system: Secondary | ICD-10-CM

## 2022-09-10 DIAGNOSIS — I10 Essential (primary) hypertension: Secondary | ICD-10-CM | POA: Diagnosis not present

## 2022-09-10 DIAGNOSIS — Z800 Family history of malignant neoplasm of digestive organs: Secondary | ICD-10-CM | POA: Diagnosis not present

## 2022-09-10 DIAGNOSIS — Z96642 Presence of left artificial hip joint: Secondary | ICD-10-CM | POA: Diagnosis not present

## 2022-09-10 DIAGNOSIS — J449 Chronic obstructive pulmonary disease, unspecified: Secondary | ICD-10-CM | POA: Diagnosis not present

## 2022-09-10 DIAGNOSIS — M48062 Spinal stenosis, lumbar region with neurogenic claudication: Secondary | ICD-10-CM

## 2022-09-10 DIAGNOSIS — Z743 Need for continuous supervision: Secondary | ICD-10-CM | POA: Diagnosis not present

## 2022-09-10 DIAGNOSIS — Z7401 Bed confinement status: Secondary | ICD-10-CM | POA: Diagnosis not present

## 2022-09-10 DIAGNOSIS — M4326 Fusion of spine, lumbar region: Secondary | ICD-10-CM | POA: Diagnosis not present

## 2022-09-10 DIAGNOSIS — Z96653 Presence of artificial knee joint, bilateral: Secondary | ICD-10-CM | POA: Diagnosis present

## 2022-09-10 DIAGNOSIS — K754 Autoimmune hepatitis: Secondary | ICD-10-CM | POA: Diagnosis present

## 2022-09-10 DIAGNOSIS — Z7982 Long term (current) use of aspirin: Secondary | ICD-10-CM

## 2022-09-10 DIAGNOSIS — R531 Weakness: Secondary | ICD-10-CM | POA: Diagnosis not present

## 2022-09-10 DIAGNOSIS — Z7951 Long term (current) use of inhaled steroids: Secondary | ICD-10-CM

## 2022-09-10 DIAGNOSIS — Z79899 Other long term (current) drug therapy: Secondary | ICD-10-CM

## 2022-09-10 DIAGNOSIS — Z7901 Long term (current) use of anticoagulants: Secondary | ICD-10-CM

## 2022-09-10 DIAGNOSIS — Z823 Family history of stroke: Secondary | ICD-10-CM

## 2022-09-10 LAB — GLUCOSE, CAPILLARY
Glucose-Capillary: 134 mg/dL — ABNORMAL HIGH (ref 70–99)
Glucose-Capillary: 154 mg/dL — ABNORMAL HIGH (ref 70–99)

## 2022-09-10 SURGERY — POSTERIOR LUMBAR FUSION 1 LEVEL
Anesthesia: General | Site: Spine Lumbar

## 2022-09-10 MED ORDER — SUGAMMADEX SODIUM 200 MG/2ML IV SOLN
INTRAVENOUS | Status: DC | PRN
  Administered 2022-09-10: 400 mg via INTRAVENOUS

## 2022-09-10 MED ORDER — LIDOCAINE 2% (20 MG/ML) 5 ML SYRINGE
INTRAMUSCULAR | Status: DC | PRN
  Administered 2022-09-10: 100 mg via INTRAVENOUS

## 2022-09-10 MED ORDER — FENTANYL CITRATE (PF) 250 MCG/5ML IJ SOLN
INTRAMUSCULAR | Status: AC
  Filled 2022-09-10: qty 5

## 2022-09-10 MED ORDER — KETOROLAC TROMETHAMINE 30 MG/ML IJ SOLN: 30.0000 mg | Freq: Once | INTRAMUSCULAR | Status: DC | PRN

## 2022-09-10 MED ORDER — ZOLPIDEM TARTRATE 5 MG PO TABS: 5.0000 mg | ORAL_TABLET | Freq: Every evening | ORAL | Status: DC | PRN

## 2022-09-10 MED ORDER — ONDANSETRON HCL 4 MG/2ML IJ SOLN
INTRAMUSCULAR | Status: AC
  Filled 2022-09-10: qty 2

## 2022-09-10 MED ORDER — ESCITALOPRAM OXALATE 10 MG PO TABS
10.0000 mg | ORAL_TABLET | Freq: Every day | ORAL | Status: DC
  Administered 2022-09-11 – 2022-09-14 (×4): 10 mg via ORAL
  Filled 2022-09-10 (×4): qty 1

## 2022-09-10 MED ORDER — PANTOPRAZOLE SODIUM 40 MG PO TBEC
40.0000 mg | DELAYED_RELEASE_TABLET | Freq: Every day | ORAL | Status: DC
  Administered 2022-09-10 – 2022-09-14 (×5): 40 mg via ORAL
  Filled 2022-09-10 (×5): qty 1

## 2022-09-10 MED ORDER — UMECLIDINIUM-VILANTEROL 62.5-25 MCG/ACT IN AEPB
1.0000 | INHALATION_SPRAY | Freq: Every day | RESPIRATORY_TRACT | Status: DC
  Administered 2022-09-11 – 2022-09-14 (×3): 1 via RESPIRATORY_TRACT
  Filled 2022-09-10: qty 14

## 2022-09-10 MED ORDER — HYDROMORPHONE HCL 1 MG/ML IJ SOLN
INTRAMUSCULAR | Status: AC
  Filled 2022-09-10: qty 1

## 2022-09-10 MED ORDER — UMECLIDINIUM BROMIDE 62.5 MCG/ACT IN AEPB
1.0000 | INHALATION_SPRAY | Freq: Every day | RESPIRATORY_TRACT | Status: DC
  Administered 2022-09-11: 1 via RESPIRATORY_TRACT
  Filled 2022-09-10: qty 7

## 2022-09-10 MED ORDER — PROMETHAZINE HCL 25 MG/ML IJ SOLN: 6.2500 mg | INTRAMUSCULAR | Status: DC | PRN

## 2022-09-10 MED ORDER — OXYCODONE HCL 10 MG PO TABS: 10.0000 mg | ORAL_TABLET | ORAL | Status: DC | PRN

## 2022-09-10 MED ORDER — INFLUENZA VAC A&B SA ADJ QUAD 0.5 ML IM PRSY
0.5000 mL | PREFILLED_SYRINGE | INTRAMUSCULAR | Status: AC
  Administered 2022-09-11: 0.5 mL via INTRAMUSCULAR
  Filled 2022-09-10: qty 0.5

## 2022-09-10 MED ORDER — LACTATED RINGERS IV SOLN: INTRAVENOUS | Status: DC | PRN

## 2022-09-10 MED ORDER — ALBUTEROL SULFATE (2.5 MG/3ML) 0.083% IN NEBU: 3.0000 mL | INHALATION_SOLUTION | Freq: Four times a day (QID) | RESPIRATORY_TRACT | Status: DC | PRN

## 2022-09-10 MED ORDER — BUPIVACAINE LIPOSOME 1.3 % IJ SUSP
INTRAMUSCULAR | Status: DC | PRN
  Administered 2022-09-10: 20 mL

## 2022-09-10 MED ORDER — OXYCODONE HCL 5 MG PO TABS
ORAL_TABLET | ORAL | Status: AC
  Filled 2022-09-10: qty 2

## 2022-09-10 MED ORDER — LINACLOTIDE 145 MCG PO CAPS
145.0000 ug | ORAL_CAPSULE | Freq: Every day | ORAL | Status: DC
  Administered 2022-09-13 – 2022-09-14 (×2): 145 ug via ORAL
  Filled 2022-09-10 (×4): qty 1

## 2022-09-10 MED ORDER — ONDANSETRON HCL 4 MG PO TABS: 4.0000 mg | ORAL_TABLET | Freq: Four times a day (QID) | ORAL | Status: DC | PRN

## 2022-09-10 MED ORDER — SODIUM CHLORIDE 0.9% FLUSH
3.0000 mL | Freq: Two times a day (BID) | INTRAVENOUS | Status: DC
  Administered 2022-09-10 – 2022-09-14 (×8): 3 mL via INTRAVENOUS

## 2022-09-10 MED ORDER — VANCOMYCIN HCL 1 G IV SOLR
INTRAVENOUS | Status: DC | PRN
  Administered 2022-09-10: 1000 mg

## 2022-09-10 MED ORDER — ROCURONIUM BROMIDE 10 MG/ML (PF) SYRINGE
PREFILLED_SYRINGE | INTRAVENOUS | Status: AC
  Filled 2022-09-10: qty 10

## 2022-09-10 MED ORDER — BACITRACIN ZINC 500 UNIT/GM EX OINT
TOPICAL_OINTMENT | CUTANEOUS | Status: AC
  Filled 2022-09-10: qty 28.35

## 2022-09-10 MED ORDER — LIDOCAINE-EPINEPHRINE 1 %-1:100000 IJ SOLN
INTRAMUSCULAR | Status: DC | PRN
  Administered 2022-09-10: 10 mL

## 2022-09-10 MED ORDER — DEXAMETHASONE SODIUM PHOSPHATE 10 MG/ML IJ SOLN
INTRAMUSCULAR | Status: AC
  Filled 2022-09-10: qty 1

## 2022-09-10 MED ORDER — HYDROMORPHONE HCL 1 MG/ML IJ SOLN
0.2500 mg | INTRAMUSCULAR | Status: DC | PRN
  Administered 2022-09-10 (×3): 0.5 mg via INTRAVENOUS

## 2022-09-10 MED ORDER — VANCOMYCIN HCL 1500 MG/300ML IV SOLN
1500.0000 mg | INTRAVENOUS | Status: AC
  Administered 2022-09-10 (×2): 1500 mg via INTRAVENOUS
  Filled 2022-09-10 (×2): qty 300

## 2022-09-10 MED ORDER — HYDRALAZINE HCL 10 MG PO TABS
10.0000 mg | ORAL_TABLET | Freq: Four times a day (QID) | ORAL | Status: DC
  Administered 2022-09-10 – 2022-09-14 (×14): 10 mg via ORAL
  Filled 2022-09-10 (×14): qty 1

## 2022-09-10 MED ORDER — BUPIVACAINE LIPOSOME 1.3 % IJ SUSP
INTRAMUSCULAR | Status: AC
  Filled 2022-09-10: qty 20

## 2022-09-10 MED ORDER — ONDANSETRON HCL 4 MG/2ML IJ SOLN: 4.0000 mg | Freq: Four times a day (QID) | INTRAMUSCULAR | Status: DC | PRN

## 2022-09-10 MED ORDER — DOCUSATE SODIUM 100 MG PO CAPS
100.0000 mg | ORAL_CAPSULE | Freq: Two times a day (BID) | ORAL | Status: DC
  Administered 2022-09-10 – 2022-09-13 (×6): 100 mg via ORAL
  Filled 2022-09-10 (×7): qty 1

## 2022-09-10 MED ORDER — VANCOMYCIN HCL 1000 MG IV SOLR
INTRAVENOUS | Status: AC
  Filled 2022-09-10: qty 20

## 2022-09-10 MED ORDER — PHENYLEPHRINE HCL-NACL 20-0.9 MG/250ML-% IV SOLN
INTRAVENOUS | Status: DC | PRN
  Administered 2022-09-10: 20 ug/min via INTRAVENOUS

## 2022-09-10 MED ORDER — TIZANIDINE HCL 4 MG PO TABS
2.0000 mg | ORAL_TABLET | Freq: Three times a day (TID) | ORAL | Status: DC | PRN
  Administered 2022-09-10 – 2022-09-12 (×3): 2 mg via ORAL
  Filled 2022-09-10 (×3): qty 1

## 2022-09-10 MED ORDER — CHLORHEXIDINE GLUCONATE CLOTH 2 % EX PADS
6.0000 | MEDICATED_PAD | Freq: Once | CUTANEOUS | Status: AC
  Administered 2022-09-10: 6 via TOPICAL

## 2022-09-10 MED ORDER — FENOFIBRATE 160 MG PO TABS
160.0000 mg | ORAL_TABLET | Freq: Every day | ORAL | Status: DC
  Administered 2022-09-10 – 2022-09-14 (×5): 160 mg via ORAL
  Filled 2022-09-10 (×5): qty 1

## 2022-09-10 MED ORDER — THROMBIN 5000 UNITS EX SOLR
CUTANEOUS | Status: AC
  Filled 2022-09-10: qty 5000

## 2022-09-10 MED ORDER — ROCURONIUM BROMIDE 10 MG/ML (PF) SYRINGE
PREFILLED_SYRINGE | INTRAVENOUS | Status: DC | PRN
  Administered 2022-09-10: 30 mg via INTRAVENOUS
  Administered 2022-09-10: 70 mg via INTRAVENOUS

## 2022-09-10 MED ORDER — SODIUM CHLORIDE 0.9% FLUSH: 3.0000 mL | INTRAVENOUS | Status: DC | PRN

## 2022-09-10 MED ORDER — AMLODIPINE BESYLATE 5 MG PO TABS
5.0000 mg | ORAL_TABLET | Freq: Every day | ORAL | Status: DC
  Administered 2022-09-11 – 2022-09-14 (×4): 5 mg via ORAL
  Filled 2022-09-10 (×4): qty 1

## 2022-09-10 MED ORDER — ROSUVASTATIN CALCIUM 5 MG PO TABS
5.0000 mg | ORAL_TABLET | Freq: Every day | ORAL | Status: DC
  Administered 2022-09-10 – 2022-09-14 (×5): 5 mg via ORAL
  Filled 2022-09-10 (×5): qty 1

## 2022-09-10 MED ORDER — MEPERIDINE HCL 25 MG/ML IJ SOLN: 6.2500 mg | INTRAMUSCULAR | Status: DC | PRN

## 2022-09-10 MED ORDER — CHLORHEXIDINE GLUCONATE 0.12 % MT SOLN: 15.0000 mL | Freq: Once | OROMUCOSAL | Status: AC

## 2022-09-10 MED ORDER — CHLORHEXIDINE GLUCONATE CLOTH 2 % EX PADS: 6.0000 | MEDICATED_PAD | Freq: Once | CUTANEOUS | Status: DC

## 2022-09-10 MED ORDER — ACETAMINOPHEN 325 MG PO TABS
650.0000 mg | ORAL_TABLET | ORAL | Status: DC | PRN
  Administered 2022-09-12: 650 mg via ORAL
  Filled 2022-09-10: qty 2

## 2022-09-10 MED ORDER — DEXAMETHASONE SODIUM PHOSPHATE 10 MG/ML IJ SOLN
INTRAMUSCULAR | Status: DC | PRN
  Administered 2022-09-10: 4 mg via INTRAVENOUS

## 2022-09-10 MED ORDER — THROMBIN 5000 UNITS EX SOLR
OROMUCOSAL | Status: DC | PRN
  Administered 2022-09-10: 5 mL via TOPICAL

## 2022-09-10 MED ORDER — ACETAMINOPHEN 650 MG RE SUPP: 650.0000 mg | RECTAL | Status: DC | PRN

## 2022-09-10 MED ORDER — OXYCODONE HCL 5 MG PO TABS: 5.0000 mg | ORAL_TABLET | ORAL | Status: DC | PRN

## 2022-09-10 MED ORDER — ARFORMOTEROL TARTRATE 15 MCG/2ML IN NEBU
15.0000 ug | INHALATION_SOLUTION | Freq: Two times a day (BID) | RESPIRATORY_TRACT | Status: DC
  Administered 2022-09-10 – 2022-09-11 (×2): 15 ug via RESPIRATORY_TRACT
  Filled 2022-09-10 (×2): qty 2

## 2022-09-10 MED ORDER — OXYCODONE HCL 5 MG PO TABS
10.0000 mg | ORAL_TABLET | ORAL | Status: DC | PRN
  Administered 2022-09-10 – 2022-09-12 (×3): 10 mg via ORAL
  Filled 2022-09-10 (×3): qty 2

## 2022-09-10 MED ORDER — LACTATED RINGERS IV SOLN: INTRAVENOUS | Status: DC

## 2022-09-10 MED ORDER — FLUTICASONE PROPIONATE 50 MCG/ACT NA SUSP
2.0000 | Freq: Every day | NASAL | Status: DC
  Administered 2022-09-10 – 2022-09-14 (×5): 2 via NASAL
  Filled 2022-09-10: qty 16

## 2022-09-10 MED ORDER — PHENOL 1.4 % MT LIQD: 1.0000 | OROMUCOSAL | Status: DC | PRN

## 2022-09-10 MED ORDER — METOPROLOL TARTRATE 50 MG PO TABS
50.0000 mg | ORAL_TABLET | Freq: Two times a day (BID) | ORAL | Status: DC
  Administered 2022-09-10 – 2022-09-14 (×8): 50 mg via ORAL
  Filled 2022-09-10 (×8): qty 1

## 2022-09-10 MED ORDER — PROPOFOL 10 MG/ML IV BOLUS
INTRAVENOUS | Status: DC | PRN
  Administered 2022-09-10: 200 mg via INTRAVENOUS

## 2022-09-10 MED ORDER — PHENYLEPHRINE 80 MCG/ML (10ML) SYRINGE FOR IV PUSH (FOR BLOOD PRESSURE SUPPORT)
PREFILLED_SYRINGE | INTRAVENOUS | Status: DC | PRN
  Administered 2022-09-10: 160 ug via INTRAVENOUS

## 2022-09-10 MED ORDER — BACITRACIN ZINC 500 UNIT/GM EX OINT
TOPICAL_OINTMENT | CUTANEOUS | Status: DC | PRN
  Administered 2022-09-10: 1 via TOPICAL

## 2022-09-10 MED ORDER — EPHEDRINE SULFATE-NACL 50-0.9 MG/10ML-% IV SOSY
PREFILLED_SYRINGE | INTRAVENOUS | Status: DC | PRN
  Administered 2022-09-10: 5 mg via INTRAVENOUS
  Administered 2022-09-10: 10 mg via INTRAVENOUS
  Administered 2022-09-10: 5 mg via INTRAVENOUS

## 2022-09-10 MED ORDER — PREGABALIN 100 MG PO CAPS
100.0000 mg | ORAL_CAPSULE | Freq: Every day | ORAL | Status: DC
  Administered 2022-09-11 – 2022-09-14 (×4): 100 mg via ORAL
  Filled 2022-09-10 (×4): qty 1

## 2022-09-10 MED ORDER — MENTHOL 3 MG MT LOZG: 1.0000 | LOZENGE | OROMUCOSAL | Status: DC | PRN

## 2022-09-10 MED ORDER — 0.9 % SODIUM CHLORIDE (POUR BTL) OPTIME
TOPICAL | Status: DC | PRN
  Administered 2022-09-10: 1000 mL

## 2022-09-10 MED ORDER — LIRAGLUTIDE 18 MG/3ML ~~LOC~~ SOPN: 1.8000 mg | PEN_INJECTOR | Freq: Every day | SUBCUTANEOUS | Status: DC

## 2022-09-10 MED ORDER — ROPINIROLE HCL 1 MG PO TABS
1.0000 mg | ORAL_TABLET | Freq: Every day | ORAL | Status: DC
  Administered 2022-09-10 – 2022-09-13 (×4): 1 mg via ORAL
  Filled 2022-09-10 (×4): qty 1

## 2022-09-10 MED ORDER — MORPHINE SULFATE (PF) 4 MG/ML IV SOLN: 4.0000 mg | INTRAVENOUS | Status: DC | PRN

## 2022-09-10 MED ORDER — FENTANYL CITRATE (PF) 250 MCG/5ML IJ SOLN
INTRAMUSCULAR | Status: DC | PRN
  Administered 2022-09-10: 100 ug via INTRAVENOUS
  Administered 2022-09-10 (×3): 50 ug via INTRAVENOUS

## 2022-09-10 MED ORDER — BISACODYL 10 MG RE SUPP: 10.0000 mg | Freq: Every day | RECTAL | Status: DC | PRN

## 2022-09-10 MED ORDER — ORAL CARE MOUTH RINSE: 15.0000 mL | Freq: Once | OROMUCOSAL | Status: AC

## 2022-09-10 MED ORDER — LIDOCAINE-EPINEPHRINE 1 %-1:100000 IJ SOLN
INTRAMUSCULAR | Status: AC
  Filled 2022-09-10: qty 1

## 2022-09-10 MED ORDER — SODIUM CHLORIDE 0.9 % IV SOLN: 250.0000 mL | INTRAVENOUS | Status: DC

## 2022-09-10 MED ORDER — LIDOCAINE 2% (20 MG/ML) 5 ML SYRINGE
INTRAMUSCULAR | Status: AC
  Filled 2022-09-10: qty 5

## 2022-09-10 MED ORDER — SUVOREXANT 10 MG PO TABS: 10.0000 mg | ORAL_TABLET | Freq: Every evening | ORAL | Status: DC | PRN

## 2022-09-10 MED ORDER — PNEUMOCOCCAL 20-VAL CONJ VACC 0.5 ML IM SUSY
0.5000 mL | PREFILLED_SYRINGE | INTRAMUSCULAR | Status: AC
  Administered 2022-09-12: 0.5 mL via INTRAMUSCULAR
  Filled 2022-09-10: qty 0.5

## 2022-09-10 MED ORDER — ONDANSETRON HCL 4 MG/2ML IJ SOLN
INTRAMUSCULAR | Status: DC | PRN
  Administered 2022-09-10: 4 mg via INTRAVENOUS

## 2022-09-10 MED ORDER — EPHEDRINE 5 MG/ML INJ
INTRAVENOUS | Status: AC
  Filled 2022-09-10: qty 5

## 2022-09-10 MED ORDER — PHENYLEPHRINE 80 MCG/ML (10ML) SYRINGE FOR IV PUSH (FOR BLOOD PRESSURE SUPPORT)
PREFILLED_SYRINGE | INTRAVENOUS | Status: AC
  Filled 2022-09-10: qty 10

## 2022-09-10 MED ORDER — MIDAZOLAM HCL 2 MG/2ML IJ SOLN
INTRAMUSCULAR | Status: DC | PRN
  Administered 2022-09-10: 1 mg via INTRAVENOUS

## 2022-09-10 MED ORDER — MIDAZOLAM HCL 2 MG/2ML IJ SOLN
INTRAMUSCULAR | Status: AC
  Filled 2022-09-10: qty 2

## 2022-09-10 MED ORDER — MORPHINE SULFATE 15 MG PO TABS
15.0000 mg | ORAL_TABLET | Freq: Two times a day (BID) | ORAL | Status: DC
  Administered 2022-09-10 – 2022-09-14 (×8): 15 mg via ORAL
  Filled 2022-09-10 (×8): qty 1

## 2022-09-10 MED ORDER — VANCOMYCIN HCL IN DEXTROSE 1-5 GM/200ML-% IV SOLN
1000.0000 mg | Freq: Once | INTRAVENOUS | Status: AC
  Administered 2022-09-11: 1000 mg via INTRAVENOUS
  Filled 2022-09-10: qty 200

## 2022-09-10 MED ORDER — ALBUTEROL SULFATE (2.5 MG/3ML) 0.083% IN NEBU
2.5000 mg | INHALATION_SOLUTION | Freq: Three times a day (TID) | RESPIRATORY_TRACT | Status: DC
  Administered 2022-09-10 – 2022-09-11 (×2): 2.5 mg via RESPIRATORY_TRACT
  Filled 2022-09-10 (×2): qty 3

## 2022-09-10 MED ORDER — CHLORHEXIDINE GLUCONATE 0.12 % MT SOLN
OROMUCOSAL | Status: AC
  Administered 2022-09-10: 15 mL via OROMUCOSAL
  Filled 2022-09-10: qty 15

## 2022-09-10 MED ORDER — KETOROLAC TROMETHAMINE 15 MG/ML IJ SOLN: 15.0000 mg | Freq: Once | INTRAMUSCULAR | Status: DC

## 2022-09-10 SURGICAL SUPPLY — 62 items
BAG COUNTER SPONGE SURGICOUNT (BAG) ×1 IMPLANT
BASKET BONE COLLECTION (BASKET) ×1 IMPLANT
BENZOIN TINCTURE PRP APPL 2/3 (GAUZE/BANDAGES/DRESSINGS) ×1 IMPLANT
BLADE CLIPPER SURG (BLADE) IMPLANT
BUR MATCHSTICK NEURO 3.0 LAGG (BURR) ×1 IMPLANT
BUR PRECISION FLUTE 6.0 (BURR) ×1 IMPLANT
CAGE ALTERA 10X31X9-13 15D (Cage) IMPLANT
CANISTER SUCT 3000ML PPV (MISCELLANEOUS) ×1 IMPLANT
CNTNR URN SCR LID CUP LEK RST (MISCELLANEOUS) ×1 IMPLANT
CONT SPEC 4OZ STRL OR WHT (MISCELLANEOUS) ×1
COVER BACK TABLE 60X90IN (DRAPES) ×1 IMPLANT
DERMABOND ADVANCED .7 DNX12 (GAUZE/BANDAGES/DRESSINGS) IMPLANT
DRAPE C-ARM 42X72 X-RAY (DRAPES) ×2 IMPLANT
DRAPE HALF SHEET 40X57 (DRAPES) ×1 IMPLANT
DRAPE LAPAROTOMY 100X72X124 (DRAPES) ×1 IMPLANT
DRAPE SURG 17X23 STRL (DRAPES) ×4 IMPLANT
DRSG OPSITE POSTOP 4X6 (GAUZE/BANDAGES/DRESSINGS) ×1 IMPLANT
DRSG OPSITE POSTOP 4X8 (GAUZE/BANDAGES/DRESSINGS) IMPLANT
ELECT BLADE 4.0 EZ CLEAN MEGAD (MISCELLANEOUS) ×1
ELECT REM PT RETURN 9FT ADLT (ELECTROSURGICAL) ×1
ELECTRODE BLDE 4.0 EZ CLN MEGD (MISCELLANEOUS) ×1 IMPLANT
ELECTRODE REM PT RTRN 9FT ADLT (ELECTROSURGICAL) ×1 IMPLANT
EVACUATOR 1/8 PVC DRAIN (DRAIN) IMPLANT
GAUZE 4X4 16PLY ~~LOC~~+RFID DBL (SPONGE) ×1 IMPLANT
GLOVE BIO SURGEON STRL SZ 6 (GLOVE) ×1 IMPLANT
GLOVE BIO SURGEON STRL SZ8 (GLOVE) ×2 IMPLANT
GLOVE BIO SURGEON STRL SZ8.5 (GLOVE) ×2 IMPLANT
GLOVE BIOGEL PI IND STRL 6.5 (GLOVE) ×1 IMPLANT
GLOVE EXAM NITRILE XL STR (GLOVE) IMPLANT
GOWN STRL REUS W/ TWL LRG LVL3 (GOWN DISPOSABLE) ×1 IMPLANT
GOWN STRL REUS W/ TWL XL LVL3 (GOWN DISPOSABLE) ×2 IMPLANT
GOWN STRL REUS W/TWL 2XL LVL3 (GOWN DISPOSABLE) IMPLANT
GOWN STRL REUS W/TWL LRG LVL3 (GOWN DISPOSABLE) ×1
GOWN STRL REUS W/TWL XL LVL3 (GOWN DISPOSABLE) ×2
HEMOSTAT POWDER KIT SURGIFOAM (HEMOSTASIS) ×1 IMPLANT
KIT BASIN OR (CUSTOM PROCEDURE TRAY) ×1 IMPLANT
KIT GRAFTMAG DEL NEURO DISP (NEUROSURGERY SUPPLIES) IMPLANT
KIT TURNOVER KIT B (KITS) ×1 IMPLANT
NDL HYPO 21X1.5 SAFETY (NEEDLE) IMPLANT
NEEDLE HYPO 21X1.5 SAFETY (NEEDLE) IMPLANT
NEEDLE HYPO 22GX1.5 SAFETY (NEEDLE) ×1 IMPLANT
NS IRRIG 1000ML POUR BTL (IV SOLUTION) ×1 IMPLANT
PACK LAMINECTOMY NEURO (CUSTOM PROCEDURE TRAY) ×1 IMPLANT
PAD ARMBOARD 7.5X6 YLW CONV (MISCELLANEOUS) ×3 IMPLANT
PATTIES SURGICAL .5 X1 (DISPOSABLE) IMPLANT
PUTTY DBM 5CC CALC GRAN (Putty) IMPLANT
ROD PREBENT 6.35X70 (Rod) IMPLANT
SCREW PEDICLE VA L635 7.5X45M (Screw) IMPLANT
SCREW SET BREAK OFF (Screw) IMPLANT
SPIKE FLUID TRANSFER (MISCELLANEOUS) ×1 IMPLANT
SPONGE NEURO XRAY DETECT 1X3 (DISPOSABLE) IMPLANT
SPONGE SURGIFOAM ABS GEL 100 (HEMOSTASIS) IMPLANT
SPONGE T-LAP 4X18 ~~LOC~~+RFID (SPONGE) IMPLANT
STRIP CLOSURE SKIN 1/2X4 (GAUZE/BANDAGES/DRESSINGS) ×1 IMPLANT
SUT VIC AB 1 CT1 18XBRD ANBCTR (SUTURE) ×2 IMPLANT
SUT VIC AB 1 CT1 8-18 (SUTURE) ×2
SUT VIC AB 2-0 CP2 18 (SUTURE) ×2 IMPLANT
SYR 20ML LL LF (SYRINGE) IMPLANT
TOWEL GREEN STERILE (TOWEL DISPOSABLE) ×1 IMPLANT
TOWEL GREEN STERILE FF (TOWEL DISPOSABLE) ×1 IMPLANT
TRAY FOLEY MTR SLVR 16FR STAT (SET/KITS/TRAYS/PACK) ×1 IMPLANT
WATER STERILE IRR 1000ML POUR (IV SOLUTION) ×1 IMPLANT

## 2022-09-10 NOTE — Anesthesia Procedure Notes (Signed)
Procedure Name: Intubation Date/Time: 09/10/2022 12:13 PM  Performed by: Genelle Bal, CRNAPre-anesthesia Checklist: Patient identified, Emergency Drugs available, Suction available and Patient being monitored Patient Re-evaluated:Patient Re-evaluated prior to induction Oxygen Delivery Method: Circle system utilized Preoxygenation: Pre-oxygenation with 100% oxygen Induction Type: IV induction Ventilation: Oral airway inserted - appropriate to patient size and Two handed mask ventilation required Laryngoscope Size: Miller and 2 Grade View: Grade I Tube type: Oral Tube size: 7.0 mm Number of attempts: 1 Airway Equipment and Method: Stylet and Oral airway Placement Confirmation: ETT inserted through vocal cords under direct vision, positive ETCO2 and breath sounds checked- equal and bilateral Secured at: 22 cm Tube secured with: Tape Dental Injury: Teeth and Oropharynx as per pre-operative assessment

## 2022-09-10 NOTE — Progress Notes (Signed)
Orthopedic Tech Progress Note Patient Details:  Nancy Simmons [birth year] [id number]  Ortho Devices Type of Ortho Device: Lumbar corsett Ortho Device/Splint Location: BACK Ortho Device/Splint Interventions: Ordered   Post Interventions Patient Tolerated: Well Instructions Provided: Care of Lemay 09/10/2022, 5:32 PM

## 2022-09-10 NOTE — Op Note (Signed)
Brief history: The patient is a 67 year old black female whose has had multiple previous back surgeries.  She has a follow-up worsening back buttock and leg pain consistent with a lumbosacral radiculopathy.  She was worked up with a lumbar myelo CT which demonstrated severe foraminal stenosis at L5-S1.  I discussed the various treatment options with her.  She has decided proceed with surgery.  Preoperative diagnosis: L5-S1 spondylosis, degenerative disc disease, spinal stenosis compressing both the L5 and the S1 nerve roots; lumbago; lumbar radiculopathy; neurogenic claudication  Postoperative diagnosis: The same  Procedure: Bilateral L5-S1 laminotomy/foraminotomies/medial facetectomy to decompress the bilateral L5 and S1 nerve roots(the work required to do this was in addition to the work required to do the posterior lumbar interbody fusion because of the patient's spinal stenosis, facet arthropathy. Etc. requiring a wide decompression of the nerve roots.);  Left L5-S1 transforaminal lumbar interbody fusion with local morselized autograft bone and Zimmer DBM; insertion of interbody prosthesis at L5-S1 (globus peek expandable interbody prosthesis); posterior segmental instrumentation from L4 to S1 with globus titanium pedicle screws and rods; posterior lateral arthrodesis at L5-S1 with local morselized autograft bone and Zimmer DBM; exploration of lumbar fusion/removal of lumbar hardware.  Surgeon: Dr. Earle Gell  Asst.: Arnetha Massy, NP  Anesthesia: Gen. endotracheal  Estimated blood loss: 200 cc  Drains: None  Complications: None  Description of procedure: The patient was brought to the operating room by the anesthesia team. General endotracheal anesthesia was induced. The patient was turned to the prone position on the Wilson frame. The patient's lumbosacral region was then prepared with Betadine scrub and Betadine solution. Sterile drapes were applied.  I then injected the area to be  incised with Marcaine with epinephrine solution. I then used the scalpel to make a linear midline incision over the L4-5 and L5-S1 interspace, incising through her old surgical scar. I then used electrocautery to perform a bilateral subperiosteal dissection exposing the spinous process and lamina of L4, L5 and S1, and exposing the old hardware at L4-5.  We then inserted the Verstrac retractor to provide exposure.  We explored the fusion by removing the caps from the old screws and then removed the rods.  We inspected the arthrodesis at L4-5.  It appeared solid.  I began the decompression by using the high speed drill to perform laminotomies at L5-S1 bilaterally. We then used the Kerrison punches to widen the laminotomy and removed the ligamentum flavum at L5-S1 bilaterally. We used the Kerrison punches to remove the medial facets at L5-S1 bilaterally, we removed the left L5-S1 facet.. We performed wide foraminotomies about the bilateral L5 and S1 nerve roots completing the decompression.  We now turned our attention to the posterior lumbar interbody fusion. I used a scalpel to incise the intervertebral disc at L5-S1 bilaterally. I then performed a partial intervertebral discectomy at L5-S1 bilaterally using the pituitary forceps. We prepared the vertebral endplates at 075-GRM bilaterally for the fusion by removing the soft tissues with the curettes. We then used the trial spacers to pick the appropriate sized interbody prosthesis. We prefilled his prosthesis with a combination of local morselized autograft bone that we obtained during the decompression as well as Zimmer DBM. We inserted the prefilled prosthesis into the interspace at L5-S1 from the left, we then turned and expanded the prosthesis. There was a good snug fit of the prosthesis in the interspace. We then filled and the remainder of the intervertebral disc space with local morselized autograft bone and Zimmer DBM. This  completed the posterior lumbar  interbody arthrodesis.  During the decompression and insertion of the prosthesis the assistant protected the thecal sac and nerve roots with the D'Errico retractor.  We now turned attention to the instrumentation. Under fluoroscopic guidance we cannulated the bilateral S1 pedicles with the bone probe. We then removed the bone probe. We then tapped the pedicle with a 6.5 millimeter tap. We then removed the tap. We probed inside the tapped pedicle with a ball probe to rule out cortical breaches. We then inserted a 7.5 x 45 millimeter pedicle screw into the S1 pedicles bilaterally under fluoroscopic guidance. We then palpated along the medial aspect of the pedicles to rule out cortical breaches. There were none. The nerve roots were not injured. We then connected the unilateral pedicle screws with a lordotic rod. We compressed the construct and secured the rod in place with the caps. We then tightened the caps appropriately. This completed the instrumentation from L4-S1 bilaterally.  We now turned our attention to the posterior lateral arthrodesis at L5-S1. We used the high-speed drill to decorticate the remainder of the facets, pars, transverse process at L5-S1. We then applied a combination of local morselized autograft bone and Zimmer DBM over these decorticated posterior lateral structures. This completed the posterior lateral arthrodesis.  We then obtained hemostasis using bipolar electrocautery. We irrigated the wound out with bacitracin solution. We inspected the thecal sac and nerve roots and noted they were well decompressed. We then removed the retractor.  We injected Exparel . We reapproximated patient's thoracolumbar fascia with interrupted #1 Vicryl suture. We reapproximated patient's subcutaneous tissue with interrupted 2-0 Vicryl suture. The reapproximated patient's skin with Steri-Strips and benzoin. The wound was then coated with bacitracin ointment. A sterile dressing was applied. The drapes  were removed. The patient was subsequently returned to the supine position where they were extubated by the anesthesia team. He was then transported to the post anesthesia care unit in stable condition. All sponge instrument and needle counts were reportedly correct at the end of this case.

## 2022-09-10 NOTE — Transfer of Care (Signed)
Immediate Anesthesia Transfer of Care Note  Patient: Nancy Simmons  Procedure(s) Performed: POSTERIOR LUMBAR INTERBODY FUSION, INTERBODY PROSTHESIS, POSTERIOR INSTRUMENTATION, LUMBAR FIVE - SACRAL ONE, EXPLORE FUSION (Spine Lumbar)  Patient Location: PACU  Anesthesia Type:General  Level of Consciousness: awake, alert , and oriented  Airway & Oxygen Therapy: Patient Spontanous Breathing and Patient connected to face mask oxygen  Post-op Assessment: Report given to RN and Post -op Vital signs reviewed and stable  Post vital signs: Reviewed and stable  Last Vitals:  Vitals Value Taken Time  BP 140/83 09/10/22 1553  Temp    Pulse 63 09/10/22 1552  Resp 16 09/10/22 1554  SpO2 92 % 09/10/22 1552  Vitals shown include unvalidated device data.  Last Pain:  Vitals:   09/10/22 0954  TempSrc:   PainSc: 8          Complications: No notable events documented.

## 2022-09-10 NOTE — H&P (Signed)
Subjective: The patient is a 67 year old black female who has had a previous L4-5 fusion.  She has not done well until recently when she developed back and right greater left leg pain consistent with a lumbosacral radiculopathy.  She has failed medical management and was worked up with a lumbar myelo CT which demonstrated lumbosacral foraminal stenosis.  I discussed the various treatment options with her.  She has decided proceed with surgery.  Past Medical History:  Diagnosis Date   Arthritis    "pretty much all over" (04/03/2017)   Asthma    Autoimmune hepatitis ([place])    COPD (chronic obstructive pulmonary disease) (HCC)    Coronary artery disease    a. mild-mod by 03/2017 following false positive nuc - She subsequently underwent cath with 30% LM, 30% ostial LAD, 45% mLAD, 50% dLAD, 30% prox RCA, no flow limiting lesions, LVEF 55-65%.    Family history of adverse reaction to anesthesia    "granddaughter has PONV"   GERD (gastroesophageal reflux disease)    High cholesterol    History of blood transfusion    "in [place]; related to knee surgeries"   History of kidney stones    Hypertension    OSA on CPAP    CPAP, pressure settings 11   Type II diabetes mellitus ([place])     Past Surgical History:  Procedure Laterality Date   ABDOMINAL HYSTERECTOMY     BACK SURGERY     CARDIAC CATHETERIZATION     CARPAL TUNNEL RELEASE Bilateral    "done in [place]"   [place] Right 2015   S/P knee revision   INTRAVASCULAR PRESSURE WIRE/FFR STUDY N/A 04/05/2017   Procedure: INTRAVASCULAR PRESSURE WIRE/FFR STUDY;  Surgeon: Leonie Man, MD;  Location: [facility];  Service: Cardiovascular;  Laterality: N/A;   JOINT REPLACEMENT     LEFT HEART CATH AND CORONARY ANGIOGRAPHY N/A 04/05/2017   Procedure: LEFT HEART CATH AND CORONARY ANGIOGRAPHY;  Surgeon: Leonie Man, MD;  Location: [facility];  Service: Cardiovascular;  Laterality: N/A;   LUMBAR  LAMINECTOMY/DECOMPRESSION MICRODISCECTOMY Left 05/22/2018   Procedure: MICRODISCECTOMY LUMBAR TWOLUMBAR THREE;  Surgeon: Newman Pies, MD;  Location: [facility];  Service: Neurosurgery;  Laterality: Left;   LUMBAR LAMINECTOMY/DECOMPRESSION MICRODISCECTOMY Left 09/03/2019   Procedure: LEFT LUMBAR TWO- LUMBAR THREE MICRODISCECTOMY;  Surgeon: Newman Pies, MD;  Location: [facility];  Service: Neurosurgery;  Laterality: Left;  MICRODISCECTOMY LEFT LUMBAR 2- LUMBAR 3   POSTERIOR FUSION LUMBAR SPINE  05/2010   REVISION TOTAL KNEE ARTHROPLASTY Right 2015   SHOULDER ARTHROSCOPY WITH ROTATOR CUFF REPAIR Right    SHOULDER OPEN ROTATOR CUFF REPAIR Left    "done in [place]"   TOTAL HIP ARTHROPLASTY Left 12/10/2017   Procedure: LEFT TOTAL HIP ARTHROPLASTY ANTERIOR APPROACH;  Surgeon: Mcarthur Rossetti, MD;  Location: [facility];  Service: Orthopedics;  Laterality: Left;   TOTAL KNEE ARTHROPLASTY Bilateral 1990s    Allergies  Allergen Reactions   Acetaminophen Other (See Comments)    PATIENT HAS AUTOIMMUNE HEPATITIS PATIENT IS * NOT * TO RECEIVE ANY ACETAMINOPHEN   Penicillin G Rash and Other (See Comments)    Has patient had a PCN reaction causing immediate rash, facial/tongue/throat swelling, SOB or lightheadedness with hypotension: No Has patient had a PCN reaction causing severe rash involving mucus membranes or skin necrosis: No Has patient had a PCN reaction that required hospitalization: No Has patient had a PCN reaction occurring within the last 10  years: #  #  #  YES  #  #  #     Social History   Tobacco Use   Smoking status: Former    Packs/day: 0.50    Years: 43.00    Total pack years: 21.50    Types: Cigarettes    Start date: 07/23/1972    Quit date: 12/06/2017    Years since quitting: 4.7   Smokeless tobacco: Never  Substance Use Topics   Alcohol use: No    Alcohol/week: 0.0 standard drinks of alcohol    Family History  Problem Relation Age of Onset   CAD Mother    Heart disease  Mother        Enlarged heart   CAD Father    Diabetes Father    Heart failure Father    CAD Brother    Diabetes Brother    Heart disease Brother        Defibrillator   Colon cancer Other    Stroke Other    Prior to Admission medications   Medication Sig Start Date End Date Taking? Authorizing Provider  albuterol (VENTOLIN HFA) 108 (90 Base) MCG/ACT inhaler Inhale 1-2 puffs into the lungs every 6 (six) hours as needed for wheezing or shortness of breath.   Yes [provider]  amLODipine (NORVASC) 5 MG tablet Take 5 mg by mouth daily.   Yes [provider]  apixaban (ELIQUIS) 5 MG TABS tablet Take 1 tablet (5 mg total) by mouth 2 (two) times daily. Restart on 09/07/2019 09/04/19  Yes Viona Gilmore D, NP  aspirin EC 81 MG tablet Take 1 tablet (81 mg total) by mouth daily. Restart on 09/07/2019 09/04/19  Yes Viona Gilmore D, NP  Choline Fenofibrate (TRILIPIX) 135 MG capsule Take 135 mg by mouth every evening.    Yes [provider]  diclofenac sodium (VOLTAREN) 1 % GEL Apply 1 application topically 4 (four) times daily as needed (for pain).   Yes [provider]  escitalopram (LEXAPRO) 10 MG tablet Take 10 mg by mouth daily.   Yes [provider]  hydrALAZINE (APRESOLINE) 10 MG tablet Take 10 mg by mouth 4 (four) times daily.   Yes [provider]  linaclotide (LINZESS) 145 MCG CAPS capsule Take 145 mcg by mouth daily before breakfast.   Yes [provider]  liraglutide (VICTOZA) 18 MG/3ML SOPN Inject 1.8 mg into the skin daily.   Yes [provider]  meclizine (ANTIVERT) 25 MG tablet Take 25 mg by mouth 3 (three) times daily as needed for dizziness.    Yes [provider]  meloxicam (MOBIC) 7.5 MG tablet Take 7.5 mg by mouth daily. 09/28/19  Yes [provider]  metoprolol tartrate (LOPRESSOR) 50 MG tablet Take 50 mg by mouth 2 (two) times daily.   Yes [provider]  morphine (MSIR) 15 MG  tablet Take 15 mg by mouth 2 (two) times daily.   Yes [provider]  Multiple Vitamins-Minerals (MULTIVITAMIN GUMMIES WOMENS PO) Take 2 each by mouth daily.   Yes [provider]  Oxycodone HCl 10 MG TABS Take 1 tablet (10 mg total) by mouth every 4 (four) hours as needed ((score 7 to 10)). 08/13/18  Yes Mcarthur Rossetti, MD  pantoprazole (PROTONIX) 40 MG tablet Take 40 mg by mouth daily.   Yes [provider]  pregabalin (LYRICA) 100 MG capsule Take 100-200 mg by mouth See admin instructions. Taking 100 mg in the AM and 200 mg  at bedtime 03/17/20  Yes [provider]  rOPINIRole (REQUIP) 1 MG tablet Take 1 mg by mouth at bedtime.   Yes [provider]  rosuvastatin (CRESTOR) 5 MG tablet Take 5 mg by mouth daily. 03/16/20  Yes [provider]  Suvorexant (BELSOMRA) 10 MG TABS Take 10 mg by mouth at bedtime as needed (sleep).   Yes [provider]  tiZANidine (ZANAFLEX) 2 MG tablet Take 1 tablet (2 mg total) by mouth every 8 (eight) hours as needed for muscle spasms. 05/26/18  Yes Newman Pies, MD  umeclidinium-vilanterol [facility] ELLIPTA) 62.5-25 MCG/ACT AEPB Inhale 1 puff into the lungs daily.   Yes [provider]  Vitamin D, Ergocalciferol, (DRISDOL) 1.25 MG (50000 UNIT) CAPS capsule Take 50,000 Units by mouth every 7 (seven) days. Sunday   Yes [provider]  albuterol (PROVENTIL) (2.5 MG/3ML) 0.083% nebulizer solution Take 2.5 mg by nebulization 3 (three) times daily.    [provider]  fluticasone (FLONASE) 50 MCG/ACT nasal spray Place 2 sprays into both nostrils daily. 03/07/20   [provider]  Glycopyrrolate-Formoterol (BEVESPI AEROSPHERE) 9-4.8 MCG/ACT AERO Inhale 2 puffs into the lungs 2 (two) times daily. 01/19/20   [provider]     Review of Systems  Positive ROS: As above  All other systems have been reviewed and were otherwise negative with the exception of those  mentioned in the HPI and as above.  Objective: Vital signs in last 24 hours: Temp:  [98.1 F (36.7 C)] 98.1 F (36.7 C) (02/19 0940) Pulse Rate:  [71] 71 (02/19 0940) Resp:  [17] 17 (02/19 0940) BP: (154)/(73) 154/73 (02/19 0940) SpO2:  [94 %] 94 % (02/19 0940) Weight:  [136.5 kg] 136.5 kg (02/19 0954) Estimated body mass index is 55.05 kg/m as calculated from the following:   Height as of this encounter: 5' 2"$  (1.575 m).   Weight as of this encounter: 136.5 kg.   General Appearance: Alert Head: Normocephalic, without obvious abnormality, atraumatic Eyes: PERRL, conjunctiva/corneas clear, EOM's intact,    Ears: Normal  Throat: Normal  Neck: Supple, Back: Her lumbar incision is well-healed. Lungs: Clear to auscultation bilaterally, respirations unlabored Heart: Regular rate and rhythm, no murmur, rub or gallop Abdomen: Soft, non-tender Extremities: Extremities normal, atraumatic, no cyanosis or edema Skin: unremarkable  NEUROLOGIC:   Mental status: alert and oriented,Motor Exam - grossly normal Sensory Exam - grossly normal Reflexes:  Coordination - grossly normal Gait - grossly normal Balance - grossly normal Cranial Nerves: I: smell Not tested  II: visual acuity  OS: Normal  OD: Normal   II: visual fields Full to confrontation  II: pupils Equal, round, reactive to light  III,VII: ptosis None  III,IV,VI: extraocular muscles  Full ROM  V: mastication Normal  V: facial light touch sensation  Normal  V,VII: corneal reflex  Present  VII: facial muscle function - upper  Normal  VII: facial muscle function - lower Normal  VIII: hearing Not tested  IX: soft palate elevation  Normal  IX,X: gag reflex Present  XI: trapezius strength  5/5  XI: sternocleidomastoid strength 5/5  XI: neck flexion strength  5/5  XII: tongue strength  Normal    Data Review Lab Results  Component Value Date   WBC 5.8 08/02/2022   HGB 11.9 (L) 08/02/2022   HCT 39.5 08/02/2022   MCV  99.5 08/02/2022   PLT 169 08/02/2022   Lab Results  Component Value Date   NA 142 08/02/2022   NA  142 08/02/2022   K 3.6 08/02/2022   K 3.6 08/02/2022   CL 110 08/02/2022   CL 111 08/02/2022   CO2 24 08/02/2022   CO2 23 08/02/2022   BUN 12 08/02/2022   BUN 12 08/02/2022   CREATININE 0.61 08/02/2022   CREATININE 0.62 08/02/2022   GLUCOSE 122 (H) 08/02/2022   GLUCOSE 120 (H) 08/02/2022   Lab Results  Component Value Date   INR 1.0 09/03/2019    Assessment/Plan: Lumbosacral spondylosis, foraminal stenosis, radiculopathy: I have discussed the situation with the patient.  I reviewed her imaging studies with her and pointed out the abnormalities.  We have discussed the various treatment options including surgery.  I described the surgical treatment option of an exploration of her lumbar fusion with an L5-S1 decompression, instrumentation and fusion.  I have shown her surgical models.  I have given her surgical pamphlet.  We have discussed the risk, benefits, alternatives, expected postoperative course, and likelihood of achieving our goals with surgery.  I have answered all her questions.  She has decided proceed with surgery.   [name] 09/10/2022 11:04 AM

## 2022-09-10 NOTE — Progress Notes (Signed)
Pharmacy Antibiotic Note  Nancy Simmons is a 67 y.o. female admitted on 09/10/2022 s/p spinal surgery.  Pharmacy has been consulted for Vancomycin dosing for surgical prophylaxis. Appears that pt received 2 doses of pre-op Vanc (1581m charted twice by 2 different RNs)  Plan: Vancomycin 10061mIV x 1 (~12 hours post pre-op dose) Pharmacy will sign off, please reconsult if needed  Height: 5' 2"$  (157.5 cm) Weight: (!) 136.5 kg (301 lb) IBW/kg (Calculated) : 50.1  Temp (24hrs), Avg:98 F (36.7 C), Min:97.7 F (36.5 C), Max:98.2 F (36.8 C)  No results for input(s): "WBC", "CREATININE", "LATICACIDVEN", "VANCOTROUGH", "VANCOPEAK", "VANCORANDOM", "GENTTROUGH", "GENTPEAK", "GENTRANDOM", "TOBRATROUGH", "TOBRAPEAK", "TOBRARND", "AMIKACINPEAK", "AMIKACINTROU", "AMIKACIN" in the last 168 hours.  CrCl cannot be calculated (Patient's most recent lab result is older than the maximum 21 days allowed.).    Allergies  Allergen Reactions   Acetaminophen Other (See Comments)    PATIENT HAS AUTOIMMUNE HEPATITIS PATIENT IS * NOT * TO RECEIVE ANY ACETAMINOPHEN   Penicillin G Rash and Other (See Comments)    Has patient had a PCN reaction causing immediate rash, facial/tongue/throat swelling, SOB or lightheadedness with hypotension: No Has patient had a PCN reaction causing severe rash involving mucus membranes or skin necrosis: No Has patient had a PCN reaction that required hospitalization: No Has patient had a PCN reaction occurring within the last 10 years: #  #  #  YES  #  #  #     Thank you for allowing pharmacy to be a part of this patient's care.  CaSherlon HandingPharmD, BCPS Please see amion for complete clinical pharmacist phone list 09/10/2022 8:02 PM

## 2022-09-10 NOTE — Anesthesia Postprocedure Evaluation (Signed)
Anesthesia Post Note  Patient: Nancy Simmons  Procedure(s) Performed: POSTERIOR LUMBAR INTERBODY FUSION, INTERBODY PROSTHESIS, POSTERIOR INSTRUMENTATION, LUMBAR FIVE - SACRAL ONE, EXPLORE FUSION (Spine Lumbar)     Patient location during evaluation: PACU Anesthesia Type: General Level of consciousness: awake Pain management: pain level controlled Vital Signs Assessment: post-procedure vital signs reviewed and stable Respiratory status: spontaneous breathing, nonlabored ventilation and respiratory function stable Cardiovascular status: blood pressure returned to baseline and stable Postop Assessment: no apparent nausea or vomiting Anesthetic complications: no   No notable events documented.  Last Vitals:  Vitals:   09/10/22 1630 09/10/22 1645  BP: 137/68 (!) 145/77  Pulse: 71 76  Resp: (!) 22 19  Temp:    SpO2: 96% 90%    Last Pain:  Vitals:   09/10/22 1630  TempSrc:   PainSc: 4                  Nilda Simmer

## 2022-09-11 NOTE — Evaluation (Signed)
Occupational Therapy Evaluation Patient Details Name: Nancy Simmons MRN: [id number] DOB: 1956/03/23 Today's Date: 09/11/2022   History of Present Illness Pt is a 67 y/o female presenting on 2/19 for same day PLIF L5-S1.  PMH includes: prior lumbar fusion, arthritis, asthma, COPD, CAD, HTN, OSA on CPAP, DM2, L THA, B TKA, B rotator cuff repair.   Clinical Impression   PTA patient reports independent with ADLs, mobility using rollator but limited by pain.  Admitted for above and limited by problem list below.  She completes bed mobility with min assist, transfers and functional mobility using RW with min assist and Adls with setup to max assist.  She typically uses AE for LB dressing, including sock aide and reacher.  She was educated on back precautions, brace mgmt and wear schedule, recommendations, DME, and safety.  Patient will benefit from continued OT services acutely and after dc at SNF level to optimize independence, safety and return to PLOF.  Will follow.      Recommendations for follow up therapy are one component of a multi-disciplinary discharge planning process, led by the attending physician.  Recommendations may be updated based on patient status, additional functional criteria and insurance authorization.   Follow Up Recommendations  Skilled nursing-short term rehab (<3 hours/day)     Assistance Recommended at Discharge Intermittent Supervision/Assistance  Patient can return home with the following A little help with walking and/or transfers;A lot of help with bathing/dressing/bathroom;Assistance with cooking/housework;Help with stairs or ramp for entrance;Assist for transportation    Functional Status Assessment  Patient has had a recent decline in their functional status and demonstrates the ability to make significant improvements in function in a reasonable and predictable amount of time.  Equipment Recommendations  None recommended by OT    Recommendations for Other  Services       Precautions / Restrictions Precautions Precautions: Back;Fall Precaution Booklet Issued: No Precaution Comments: reviewed with pt Required Braces or Orthoses: Spinal Brace Spinal Brace: Lumbar corset;Applied in sitting position Restrictions Weight Bearing Restrictions: No      Mobility Bed Mobility Overal bed mobility: Needs Assistance Bed Mobility: Rolling, Sidelying to Sit Rolling: Min guard Sidelying to sit: Min assist       General bed mobility comments: trunk support to ascend, increased time and cueing for technique    Transfers Overall transfer level: Needs assistance Equipment used: Rolling walker (2 wheels) Transfers: Sit to/from Stand Sit to Stand: Min assist           General transfer comment: to power up and steady, cueing for posture and hand placement      Balance Overall balance assessment: Needs assistance Sitting-balance support: No upper extremity supported, Feet supported Sitting balance-Leahy Scale: Fair     Standing balance support: Bilateral upper extremity supported, During functional activity Standing balance-Leahy Scale: Poor Standing balance comment: relies on UE support                           ADL either performed or assessed with clinical judgement   ADL Overall ADL's : Needs assistance/impaired     Grooming: Set up;Sitting           Upper Body Dressing : Minimal assistance;Sitting Upper Body Dressing Details (indicate cue type and reason): brace Lower Body Dressing: Maximal assistance;Sit to/from stand Lower Body Dressing Details (indicate cue type and reason): requires AE for socks, min assist in standing and relies on BUE support Toilet Transfer: Minimal assistance;Ambulation;Rolling walker (  2 wheels)           Functional mobility during ADLs: Minimal assistance;Rolling walker (2 wheels)       Vision   Vision Assessment?: No apparent visual deficits     Perception     Praxis       Pertinent Vitals/Pain Pain Assessment Pain Assessment: Faces Faces Pain Scale: Hurts little more Pain Location: back- incisional Pain Descriptors / Indicators: Discomfort, Operative site guarding Pain Intervention(s): Limited activity within patient's tolerance, Monitored during session, Repositioned     Hand Dominance Right   Extremity/Trunk Assessment Upper Extremity Assessment Upper Extremity Assessment: Generalized weakness   Lower Extremity Assessment Lower Extremity Assessment: Defer to PT evaluation   Cervical / Trunk Assessment Cervical / Trunk Assessment: Back Surgery   Communication Communication Communication: No difficulties   Cognition Arousal/Alertness: Awake/alert Behavior During Therapy: WFL for tasks assessed/performed Overall Cognitive Status: Impaired/Different from baseline Area of Impairment: Memory                     Memory: Decreased short-term memory         General Comments: pt with decreased recall during session     General Comments       Exercises     Shoulder Instructions      Home Living Family/patient expects to be discharged to:: Skilled nursing facility Living Arrangements: Alone   Type of Home: Apartment Home Access: Level entry     Home Layout: One level     Bathroom Shower/Tub: Teacher, early years/pre: Standard (BSC over)     Home Equipment: Rollator (4 wheels);Cane - single point;BSC/3in1;Tub bench;Hospital bed;Grab bars - toilet;Adaptive equipment Adaptive Equipment: Reacher;Sock aid        Prior Functioning/Environment Prior Level of Function : Independent/Modified Independent;Driving             Mobility Comments: uses rollator ADLs Comments: independent as tolerated with pain        OT Problem List: Decreased strength;Decreased activity tolerance;Impaired balance (sitting and/or standing);Decreased knowledge of use of DME or AE;Decreased knowledge of  precautions;Pain;Obesity      OT Treatment/Interventions: Self-care/ADL training;Therapeutic exercise;DME and/or AE instruction;Therapeutic activities;Balance training;Patient/family education;Energy conservation    OT Goals(Current goals can be found in the care plan section) Acute Rehab OT Goals Patient Stated Goal: get to rehab OT Goal Formulation: With patient Time For Goal Achievement: 09/25/22 Potential to Achieve Goals: Good  OT Frequency: Min 2X/week    Co-evaluation              AM-PAC OT "6 Clicks" Daily Activity     Outcome Measure Help from another person eating meals?: None Help from another person taking care of personal grooming?: A Little Help from another person toileting, which includes using toliet, bedpan, or urinal?: A Lot Help from another person bathing (including washing, rinsing, drying)?: A Lot Help from another person to put on and taking off regular upper body clothing?: A Little Help from another person to put on and taking off regular lower body clothing?: A Lot 6 Click Score: 16   End of Session Equipment Utilized During Treatment: Rolling walker (2 wheels);Back brace Nurse Communication: Mobility status  Activity Tolerance: Patient tolerated treatment well Patient left: with call bell/phone within reach;Other (comment) (sitting EOB with PT present)  OT Visit Diagnosis: Other abnormalities of gait and mobility (R26.89);Muscle weakness (generalized) (M62.81);Pain Pain - part of body:  (back)  [id number] OT Time Calculation (min): 21 min Charges:  OT General Charges $OT Visit: 1 Visit OT Evaluation $OT Eval Moderate Complexity: [place], OT Acute Rehabilitation Services Office [phone]   Delight Stare 09/11/2022, 9:48 AM

## 2022-09-11 NOTE — Progress Notes (Signed)
Subjective: The patient is alert and pleasant.  She is appropriately sore.  She is interested in rehab.  Objective: Vital signs in last 24 hours: Temp:  [97.7 F (36.5 C)-98.2 F (36.8 C)] 97.8 F (36.6 C) (02/20 0338) Pulse Rate:  [63-85] 63 (02/20 0338) Resp:  [9-22] 17 (02/20 0338) BP: (98-154)/(54-86) 133/54 (02/20 0338) SpO2:  [90 %-97 %] 96 % (02/20 0338) Weight:  [136.5 kg] 136.5 kg (02/19 0954) Estimated body mass index is 55.05 kg/m as calculated from the following:   Height as of this encounter: 5' 2"$  (1.575 m).   Weight as of this encounter: 136.5 kg.   Intake/Output from previous day: 02/19 0701 - 02/20 0700 In: 2040 [P.O.:240; I.V.:1600; IV Piggyback:200] Out: 795 [Urine:595; Blood:200] Intake/Output this shift: No intake/output data recorded.  Physical exam the patient is alert and pleasant.  Her strength is normal.  Lab Results: No results for input(s): "WBC", "HGB", "HCT", "PLT" in the last 72 hours. BMET No results for input(s): "NA", "K", "CL", "CO2", "GLUCOSE", "BUN", "CREATININE", "CALCIUM" in the last 72 hours.  Studies/Results: DG Lumbar Spine 2-3 Views  Result Date: 09/10/2022 CLINICAL DATA:  Elective surgery. EXAM: LUMBAR SPINE - 2-3 VIEW COMPARISON:  Lumbar myelogram 04/05/2022 FLUOROSCOPY: Radiation Exposure Index (as provided by the fluoroscopic device): 11.60 mGy Kerma FINDINGS: 2 intraoperative spot fluoroscopic images of the lumbosacral junction are provided. Previous posterior and interbody fusion are noted at L4-5. New pedicle screws are in place at S1, and interbody spacer has been placed at L5-S1. Tissue retractors are in place posteriorly. IMPRESSION: Intraoperative images during L5-S1 fusion. Electronically Signed   By: Logan Bores M.D.   On: 09/10/2022 15:15   DG C-Arm 1-60 Min-No Report  Result Date: 09/10/2022 Fluoroscopy was utilized by the requesting physician.  No radiographic interpretation.    Assessment/Plan: Postop day 1: The  patient is doing well.  I will ask rehab to see the patient.  LOS: 1 day     [name] 09/11/2022, 7:56 AM     Patient ID: Nancy Simmons, female   DOB: [birth year], 67 y.o.   MRN: [id number]

## 2022-09-11 NOTE — Plan of Care (Signed)
  Problem: Education: Goal: Knowledge of General Education information will improve Description: Including pain rating scale, medication(s)/side effects and non-pharmacologic comfort measures Outcome: Progressing   Problem: Health Behavior/Discharge Planning: Goal: Ability to manage health-related needs will improve Outcome: Progressing   Problem: Nutrition: Goal: Adequate nutrition will be maintained Outcome: Progressing   Problem: Coping: Goal: Level of anxiety will decrease Outcome: Progressing   Problem: Education: Goal: Ability to verbalize activity precautions or restrictions will improve Outcome: Progressing Goal: Knowledge of the prescribed therapeutic regimen will improve Outcome: Progressing   Problem: Activity: Goal: Risk for activity intolerance will decrease Outcome: Not Progressing   Problem: Pain Managment: Goal: General experience of comfort will improve Outcome: Not Progressing

## 2022-09-11 NOTE — TOC Initial Note (Signed)
Transition of Care [facility]) - Initial/Assessment Note    Patient Details  Name: Nancy Simmons MRN: [id number] Date of Birth: 17-Jul-1956  Transition of Care [facility]) CM/SW Contact:    Pollie Friar, RN Phone Number: 09/11/2022, 1:38 PM  Clinical Narrative:                 Pt is s/p lumbar surgery. Current recommendations are for SNF rehab. CM met with the patient and she is interested in SNF rehab and her first choice is [facility] but open to being sent out in the [facility] area. Cm has sent her information to the facilities and sent [facility] a message to please review.  Pt will need ambulance transport to the facility. Pt states her family can bring her CPAP to the rehab facility. TOC following.  Expected Discharge Plan: Skilled Nursing Facility Barriers to Discharge: Continued Medical Work up, SNF Pending bed offer   Patient Goals and CMS Choice   CMS Medicare.gov Compare Post Acute Care list provided to:: Patient Choice offered to / list presented to : Patient      Expected Discharge Plan and Services In-house Referral: Clinical Social Work Discharge Planning Services: CM Consult Post Acute Care Choice: [place] Living arrangements for the past 2 months: [place]                                      Prior Living Arrangements/Services Living arrangements for the past 2 months: Single Family Home Lives with:: Self Patient language and need for interpreter reviewed:: Yes Do you feel safe going back to the place where you live?: Yes      Need for Family Participation in Patient Care: Yes (Comment) Care giver support system in place?: No (comment) Current home services: DME (Cpap) Criminal Activity/Legal Involvement Pertinent to Current Situation/Hospitalization: No - Comment as needed  Activities of Daily Living Home Assistive Devices/Equipment: Environmental consultant (specify type), Bedside commode/3-in-1, Cane (specify quad or straight), Hospital bed  (rollator) ADL Screening (condition at time of admission) Patient's cognitive ability adequate to safely complete daily activities?: Yes Is the patient deaf or have difficulty hearing?: No Does the patient have difficulty seeing, even when wearing glasses/contacts?: No Does the patient have difficulty concentrating, remembering, or making decisions?: No Patient able to express need for assistance with ADLs?: Yes Does the patient have difficulty dressing or bathing?: No Independently performs ADLs?: Yes (appropriate for developmental age) Does the patient have difficulty walking or climbing stairs?: No Weakness of Legs: Both Weakness of Arms/Hands: None  Permission Sought/Granted                  Emotional Assessment Appearance:: Appears stated age Attitude/Demeanor/Rapport: Engaged Affect (typically observed): Accepting Orientation: : Oriented to Self, Oriented to Place, Oriented to  Time, Oriented to Situation   Psych Involvement: No (comment)  Admission diagnosis:  Status post lumbar spinal fusion [Z98.1] Patient Active Problem List   Diagnosis Date Noted   Status post lumbar spinal fusion 09/10/2022   History of kidney stones 08/27/2022   History of blood transfusion 08/27/2022   GERD (gastroesophageal reflux disease) 08/27/2022   Family history of adverse reaction to anesthesia 08/27/2022   COPD (chronic obstructive pulmonary disease) ([place]) 08/27/2022   Arthritis 08/27/2022   Chest pain, unspecified 06/20/2018   CAD in native artery 06/20/2018   Morbid obesity ([place]) 06/20/2018   History of DVT (deep  vein thrombosis) 06/20/2018   Chest pain 06/20/2018   Lumbar herniated disc 05/22/2018   Status post left hip replacement 12/10/2017   Unilateral primary osteoarthritis, left hip 11/04/2017   Abnormal stress echocardiography    Stable angina 04/03/2017   DM2 (diabetes mellitus, type 2) ([place]) 04/03/2017   Asthma 04/03/2017   OSA on CPAP 04/03/2017   Angina at rest  04/03/2017   Medication monitoring encounter 07/09/2016   Autoimmune hepatitis ([place]) 07/09/2016   Infection of prosthetic right knee joint ([place]) 09/27/2015   HTN (hypertension) 09/27/2015   CAD (coronary artery disease) of artery bypass graft 09/27/2015   Hypercholesterolemia 09/27/2015   S/P cholecystectomy 09/27/2015   PCP:  Helen Hashimoto., MD Pharmacy:   [place], [place] - [address] [address] Phone: [phone] Fax: [phone]     Social Determinants of Health (SDOH) Social History: SDOH Screenings   Food Insecurity: No Food Insecurity (09/10/2022)  Housing: Low Risk  (09/10/2022)  Transportation Needs: No Transportation Needs (09/10/2022)  Utilities: Not At Risk (09/10/2022)  Tobacco Use: Medium Risk (09/10/2022)   SDOH Interventions:     Readmission Risk Interventions     No data to display

## 2022-09-11 NOTE — Progress Notes (Signed)
Mobility Specialist: Progress Note   09/11/22 1811  Mobility  Activity Ambulated with assistance in hallway  Level of Assistance Minimal assist, patient does 75% or more  Assistive Device Front wheel walker  Distance Ambulated (ft) 100 ft  Activity Response Tolerated well  Mobility Referral Yes  $Mobility charge 1 Mobility   Pt received in the bed and agreeable to mobility. MinA with bed mobility and as well as to stand. C/o mild dizziness upon sitting EOB, resolved quickly. No c/o throughout. Pt back to bed after session with call bell and phone at her side. Bed alarm is on.   Ostrander Miachel Nardelli Mobility Specialist Please contact via SecureChat or Rehab office at [phone]

## 2022-09-11 NOTE — Evaluation (Signed)
Physical Therapy Evaluation Patient Details Name: Nancy Simmons MRN: [id number] DOB: [birth year] Today's Date: 09/11/2022  History of Present Illness  Pt is a 67 y/o female presenting on 2/19 for same day PLIF L5-S1.  PMH includes: prior lumbar fusion, arthritis, asthma, COPD, CAD, HTN, OSA on CPAP, DM2, L THA, B TKA, B rotator cuff repair.  Clinical Impression  PTA, pt lives alone, uses a Rollator and is independent with ADL's. On PT evaluation, pt denies radicular pain or numbness/tingling. Overall is mobilizing fairly, ambulating 75 ft with a walker. Demonstrates good adherence to spinal precautions. Presents with impaired standing balance, weakness, decreased activity tolerance, back pain. Due to deficits and decreased caregiver support, recommend SNF at d/c.     Recommendations for follow up therapy are one component of a multi-disciplinary discharge planning process, led by the attending physician.  Recommendations may be updated based on patient status, additional functional criteria and insurance authorization.  Follow Up Recommendations Skilled nursing-short term rehab (<3 hours/day) Can patient physically be transported by private vehicle: Yes    Assistance Recommended at Discharge Intermittent Supervision/Assistance  Patient can return home with the following  Assistance with cooking/housework;Help with stairs or ramp for entrance;Assist for transportation    Equipment Recommendations None recommended by PT  Recommendations for Other Services       Functional Status Assessment Patient has had a recent decline in their functional status and demonstrates the ability to make significant improvements in function in a reasonable and predictable amount of time.     Precautions / Restrictions Precautions Precautions: Back;Fall Precaution Booklet Issued: No Precaution Comments: reviewed with pt Required Braces or Orthoses: Spinal Brace Spinal Brace: Lumbar corset;Applied in sitting  position Restrictions Weight Bearing Restrictions: No      Mobility  Bed Mobility               General bed mobility comments: Sitting EOB upon entry    Transfers Overall transfer level: Needs assistance Equipment used: Rolling walker (2 wheels) Transfers: Sit to/from Stand Sit to Stand: Supervision                Ambulation/Gait Ambulation/Gait assistance: Supervision Gait Distance (Feet): 75 Feet Assistive device: Rolling walker (2 wheels) Gait Pattern/deviations: Step-through pattern, Decreased stride length Gait velocity: decreased     General Gait Details: Min cues for upright posture. Slow and steady pace  Financial trader Rankin (Stroke Patients Only)       Balance Overall balance assessment: Needs assistance Sitting-balance support: No upper extremity supported, Feet supported Sitting balance-Leahy Scale: Good     Standing balance support: Bilateral upper extremity supported, During functional activity Standing balance-Leahy Scale: Poor Standing balance comment: relies on UE support                             Pertinent Vitals/Pain Pain Assessment Pain Assessment: Faces Faces Pain Scale: Hurts little more Pain Location: back- incisional Pain Descriptors / Indicators: Discomfort, Operative site guarding Pain Intervention(s): Monitored during session    Home Living Family/patient expects to be discharged to:: Skilled nursing facility Living Arrangements: Alone   Type of Home: Apartment Home Access: Level entry       Home Layout: One level Home Equipment: Rollator (4 wheels);Cane - single point;BSC/3in1;Tub bench;Hospital bed;Grab bars - toilet;Adaptive equipment      Prior Function Prior Level of Function :  Independent/Modified Independent;Driving             Mobility Comments: uses rollator ADLs Comments: independent as tolerated with pain     Hand Dominance    Dominant Hand: Right    Extremity/Trunk Assessment   Upper Extremity Assessment Upper Extremity Assessment: Defer to OT evaluation    Lower Extremity Assessment Lower Extremity Assessment: RLE deficits/detail;LLE deficits/detail RLE Deficits / Details: Grossly 4/5 (hip flexion within limited range) LLE Deficits / Details: Grossly 4/5 (hip flexion within limited range)    Cervical / Trunk Assessment Cervical / Trunk Assessment: Back Surgery  Communication   Communication: No difficulties  Cognition Arousal/Alertness: Awake/alert Behavior During Therapy: WFL for tasks assessed/performed Overall Cognitive Status: Impaired/Different from baseline Area of Impairment: Memory                     Memory: Decreased short-term memory         General Comments: pt with decreased recall during session        General Comments      Exercises     Assessment/Plan    PT Assessment Patient needs continued PT services  PT Problem List Decreased strength;Decreased activity tolerance;Decreased balance;Decreased mobility;Pain       PT Treatment Interventions DME instruction;Gait training;Functional mobility training;Therapeutic exercise;Therapeutic activities;Balance training;Patient/family education    PT Goals (Current goals can be found in the Care Plan section)  Acute Rehab PT Goals Patient Stated Goal: go to rehab PT Goal Formulation: All assessment and education complete, DC therapy    Frequency Min 3X/week     Co-evaluation               AM-PAC PT "6 Clicks" Mobility  Outcome Measure Help needed turning from your back to your side while in a flat bed without using bedrails?: A Little Help needed moving from lying on your back to sitting on the side of a flat bed without using bedrails?: A Little Help needed moving to and from a bed to a chair (including a wheelchair)?: A Little Help needed standing up from a chair using your arms (e.g., wheelchair or  bedside chair)?: A Little Help needed to walk in hospital room?: A Little Help needed climbing 3-5 steps with a railing? : A Little 6 Click Score: 18    End of Session Equipment Utilized During Treatment: Back brace Activity Tolerance: Patient tolerated treatment well Patient left: in chair;with call bell/phone within reach;with chair alarm set Nurse Communication: Mobility status PT Visit Diagnosis: Unsteadiness on feet (R26.81);Pain Pain - part of body:  (back)    Time: [id number] PT Time Calculation (min) (ACUTE ONLY): 29 min   Charges:   PT Evaluation $PT Eval Low Complexity: 1 Low PT Treatments $Therapeutic Activity: 8-22 mins        Wyona Almas, PT, DPT Acute Rehabilitation Services Office [phone]   Deno Etienne 09/11/2022, 11:50 AM

## 2022-09-11 NOTE — NC FL2 (Signed)
[place] LEVEL OF CARE FORM     IDENTIFICATION  Patient Name: Nancy Simmons Birthdate: 10-26-1955 Sex: female Admission Date (Current Location): 09/10/2022  [facility] and [place] Number:  [employer] and Address:  [facility] [place], [place] [address] [place], [place] [address]      Provider Number: [id number]  Attending Physician Name and Address:  Newman Pies, MD  Relative Name and Phone Number:       Current Level of Care: Hospital Recommended Level of Care: [place] Prior Approval Number:    Date Approved/Denied:   PASRR Number: [id number]  Discharge Plan: SNF    Current Diagnoses: Patient Active Problem List   Diagnosis Date Noted   Status post lumbar spinal fusion 09/10/2022   History of kidney stones 08/27/2022   History of blood transfusion 08/27/2022   GERD (gastroesophageal reflux disease) 08/27/2022   Family history of adverse reaction to anesthesia 08/27/2022   COPD (chronic obstructive pulmonary disease) ([place]) 08/27/2022   Arthritis 08/27/2022   Chest pain, unspecified 06/20/2018   CAD in native artery 06/20/2018   Morbid obesity ([place]) 06/20/2018   History of DVT (deep vein thrombosis) 06/20/2018   Chest pain 06/20/2018   Lumbar herniated disc 05/22/2018   Status post left hip replacement 12/10/2017   Unilateral primary osteoarthritis, left hip 11/04/2017   Abnormal stress echocardiography    Stable angina 04/03/2017   DM2 (diabetes mellitus, type 2) ([place]) 04/03/2017   Asthma 04/03/2017   OSA on CPAP 04/03/2017   Angina at rest 04/03/2017   Medication monitoring encounter 07/09/2016   Autoimmune hepatitis ([place]) 07/09/2016   Infection of prosthetic right knee joint ([place]) 09/27/2015   HTN (hypertension) 09/27/2015   CAD (coronary artery disease) of artery bypass graft 09/27/2015   Hypercholesterolemia 09/27/2015   S/P cholecystectomy 09/27/2015    Orientation RESPIRATION BLADDER Height &  Weight     Self, Time, Situation, Place  Normal, Other (Comment) (CPAP at night she will bring hers to the rehab) Continent Weight: (!) 136.5 kg Height:  5' 2"$  (157.5 cm)  BEHAVIORAL SYMPTOMS/MOOD NEUROLOGICAL BOWEL NUTRITION STATUS      Continent Diet (regular with thin liquids)  AMBULATORY STATUS COMMUNICATION OF NEEDS Skin   Limited Assist Verbally Surgical wounds (back incision)                       Personal Care Assistance Level of Assistance  Bathing, Feeding, Dressing Bathing Assistance: Maximum assistance Feeding assistance: Limited assistance Dressing Assistance: Maximum assistance     Functional Limitations Info  Sight, Hearing, Speech Sight Info: Adequate Hearing Info: Adequate Speech Info: Adequate    SPECIAL CARE FACTORS FREQUENCY  PT (By licensed PT), OT (By licensed OT)     PT Frequency: 5x/wk OT Frequency: 5x/wk            Contractures Contractures Info: Not present    Additional Factors Info  Code Status, Allergies, Psychotropic Code Status Info: Full Allergies Info: Acetaminophen/ Penicillin G Psychotropic Info: Lexapro 10 mg daily/ MSIR 15 mg BID/ Lyrica 100 mg daily/ Requip 1 mg at bedtime         Current Medications (09/11/2022):  This is the current hospital active medication list Current Facility-Administered Medications  Medication Dose Route Frequency Provider Last Rate Last Admin   0.9 %  sodium chloride infusion  250 mL Intravenous Continuous Newman Pies, MD       acetaminophen (TYLENOL) tablet 650 mg  650 mg Oral Q4H PRN Newman Pies, MD       Or   acetaminophen (TYLENOL) suppository 650 mg  650 mg Rectal Q4H PRN Newman Pies, MD       albuterol (PROVENTIL) (2.5 MG/3ML) 0.083% nebulizer solution 3 mL  3 mL Inhalation Q6H PRN Newman Pies, MD       amLODipine (NORVASC) tablet 5 mg  5 mg Oral Daily Newman Pies, MD   5 mg at 09/11/22 0901   arformoterol (BROVANA) nebulizer solution 15 mcg  15 mcg Nebulization  BID Newman Pies, MD   15 mcg at 09/11/22 0815   And   umeclidinium bromide (INCRUSE ELLIPTA) 62.5 MCG/ACT 1 puff  1 puff Inhalation Daily Newman Pies, MD   1 puff at 09/11/22 0816   bisacodyl (DULCOLAX) suppository 10 mg  10 mg Rectal Daily PRN Newman Pies, MD       docusate sodium (COLACE) capsule 100 mg  100 mg Oral BID Newman Pies, MD   100 mg at 09/10/22 2114   escitalopram (LEXAPRO) tablet 10 mg  10 mg Oral Daily Newman Pies, MD   10 mg at 09/11/22 0901   fenofibrate tablet 160 mg  160 mg Oral Daily Newman Pies, MD   160 mg at 09/11/22 0854   fluticasone (FLONASE) 50 MCG/ACT nasal spray 2 spray  2 spray Each Nare Daily Newman Pies, MD   2 spray at 09/11/22 [id number]   hydrALAZINE (APRESOLINE) tablet 10 mg  10 mg Oral QID Newman Pies, MD   10 mg at 09/11/22 1200   linaclotide (LINZESS) capsule 145 mcg  145 mcg Oral QAC breakfast Newman Pies, MD       liraglutide (VICTOZA) SOPN 1.8 mg  1.8 mg Subcutaneous Daily Newman Pies, MD   1.8 mg at 09/11/22 1200   menthol-cetylpyridinium (CEPACOL) lozenge 3 mg  1 lozenge Oral PRN Newman Pies, MD       Or   phenol (CHLORASEPTIC) mouth spray 1 spray  1 spray Mouth/Throat PRN Newman Pies, MD       metoprolol tartrate (LOPRESSOR) tablet 50 mg  50 mg Oral BID Newman Pies, MD   50 mg at 09/11/22 0902   morphine (MSIR) tablet 15 mg  15 mg Oral BID Newman Pies, MD   15 mg at 09/11/22 0901   morphine (PF) 4 MG/ML injection 4 mg  4 mg Intravenous Q2H PRN Newman Pies, MD       ondansetron [facility] tablet 4 mg  4 mg Oral Q6H PRN Newman Pies, MD       Or   ondansetron [facility]) injection 4 mg  4 mg Intravenous Q6H PRN Newman Pies, MD       oxyCODONE (Oxy IR/ROXICODONE) immediate release tablet 10 mg  10 mg Oral Q3H PRN Newman Pies, MD   10 mg at 09/11/22 1158   oxyCODONE (Oxy IR/ROXICODONE) immediate release tablet 5 mg  5 mg Oral Q3H PRN Newman Pies, MD       pantoprazole  (PROTONIX) EC tablet 40 mg  40 mg Oral Daily Newman Pies, MD   40 mg at 09/11/22 0901   pneumococcal 20-valent conjugate vaccine (PREVNAR 20) injection 0.5 mL  0.5 mL Intramuscular Tomorrow-1000 Newman Pies, MD       pregabalin (LYRICA) capsule 100 mg  100 mg Oral Daily Newman Pies, MD   100 mg at 09/11/22 0854   rOPINIRole (REQUIP) tablet 1 mg  1 mg Oral QHS Newman Pies, MD   1 mg at 09/10/22  2114   rosuvastatin (CRESTOR) tablet 5 mg  5 mg Oral Daily Newman Pies, MD   5 mg at 09/11/22 0902   sodium chloride flush (NS) 0.9 % injection 3 mL  3 mL Intravenous Q12H Newman Pies, MD   3 mL at 09/11/22 0904   sodium chloride flush (NS) 0.9 % injection 3 mL  3 mL Intravenous PRN Newman Pies, MD       tiZANidine (ZANAFLEX) tablet 2 mg  2 mg Oral Q8H PRN Newman Pies, MD   2 mg at 09/11/22 1158   umeclidinium-vilanterol (ANORO ELLIPTA) 62.5-25 MCG/ACT 1 puff  1 puff Inhalation Daily Newman Pies, MD   1 puff at 09/11/22 0816   zolpidem (AMBIEN) tablet 5 mg  5 mg Oral QHS PRN Newman Pies, MD         Discharge Medications: Please see discharge summary for a list of discharge medications.  Relevant Imaging Results:  Relevant Lab Results:   Additional Information SS#: [id number]  Pollie Friar, RN

## 2022-09-11 NOTE — Progress Notes (Signed)
Patient state she does not want to use the hospital CPAP, she will have a family member bring her CPAP unit from home.

## 2022-09-12 ENCOUNTER — Inpatient Hospital Stay (HOSPITAL_COMMUNITY): Payer: 59

## 2022-09-12 LAB — BASIC METABOLIC PANEL
Anion gap: 11 (ref 5–15)
Anion gap: 8 (ref 5–15)
BUN: 14 mg/dL (ref 8–23)
BUN: 14 mg/dL (ref 8–23)
CO2: 22 mmol/L (ref 22–32)
CO2: 25 mmol/L (ref 22–32)
Calcium: 8.9 mg/dL (ref 8.9–10.3)
Calcium: 8.7 mg/dL — ABNORMAL LOW (ref 8.9–10.3)
Chloride: 106 mmol/L (ref 98–111)
Chloride: 107 mmol/L (ref 98–111)
Creatinine, Ser: 0.67 mg/dL (ref 0.44–1.00)
Creatinine, Ser: 0.69 mg/dL (ref 0.44–1.00)
GFR, Estimated: >60 mL/min (ref >60)
GFR, Estimated: >60 mL/min (ref >60)
Glucose, Bld: 134 mg/dL — ABNORMAL HIGH (ref 70–99)
Glucose, Bld: 156 mg/dL — ABNORMAL HIGH (ref 70–99)
Potassium: 3.6 mmol/L (ref 3.5–5.1)
Potassium: 3.7 mmol/L (ref 3.5–5.1)
Sodium: 139 mmol/L (ref 135–145)
Sodium: 140 mmol/L (ref 135–145)

## 2022-09-12 LAB — URINALYSIS, ROUTINE W REFLEX MICROSCOPIC
Bilirubin Urine: NEGATIVE
Glucose, UA: NEGATIVE mg/dL
Hgb urine dipstick: NEGATIVE
Ketones, ur: NEGATIVE mg/dL
Nitrite: NEGATIVE
Protein, ur: NEGATIVE mg/dL
Specific Gravity, Urine: 1.018 (ref 1.005–1.030)
pH: 5 (ref 5.0–8.0)

## 2022-09-12 LAB — CBC
HCT: 33.9 % — ABNORMAL LOW (ref 36.0–46.0)
HCT: 31.9 % — ABNORMAL LOW (ref 36.0–46.0)
Hemoglobin: 10.9 g/dL — ABNORMAL LOW (ref 12.0–15.0)
Hemoglobin: 10 g/dL — ABNORMAL LOW (ref 12.0–15.0)
MCH: 30.3 pg (ref 26.0–34.0)
MCH: 29.8 pg (ref 26.0–34.0)
MCHC: 32.2 g/dL (ref 30.0–36.0)
MCHC: 31.3 g/dL (ref 30.0–36.0)
MCV: 94.2 fL (ref 80.0–100.0)
MCV: 94.9 fL (ref 80.0–100.0)
Platelets: 163 10*3/uL (ref 150–400)
Platelets: 155 10*3/uL (ref 150–400)
RBC: 3.6 MIL/uL — ABNORMAL LOW (ref 3.87–5.11)
RBC: 3.36 MIL/uL — ABNORMAL LOW (ref 3.87–5.11)
RDW: 14.6 % (ref 11.5–15.5)
RDW: 14.5 % (ref 11.5–15.5)
WBC: 10.7 10*3/uL — ABNORMAL HIGH (ref 4.0–10.5)
WBC: 9.7 10*3/uL (ref 4.0–10.5)
nRBC: 0 % (ref 0.0–0.2)
nRBC: 0 % (ref 0.0–0.2)

## 2022-09-12 LAB — BLOOD GAS, ARTERIAL
Acid-Base Excess: 0.5 mmol/L (ref 0.0–2.0)
Bicarbonate: 25.4 mmol/L (ref 20.0–28.0)
O2 Saturation: 93.9 %
Patient temperature: 37.6
pCO2 arterial: 42 mmHg (ref 32–48)
pH, Arterial: 7.39 (ref 7.35–7.45)
pO2, Arterial: 70 mmHg — ABNORMAL LOW (ref 83–108)

## 2022-09-12 LAB — GLUCOSE, CAPILLARY: Glucose-Capillary: 145 mg/dL — ABNORMAL HIGH (ref 70–99)

## 2022-09-12 MED ORDER — NITROFURANTOIN MONOHYD MACRO 100 MG PO CAPS
100.0000 mg | ORAL_CAPSULE | Freq: Two times a day (BID) | ORAL | Status: DC
  Administered 2022-09-12 – 2022-09-14 (×5): 100 mg via ORAL
  Filled 2022-09-12 (×6): qty 1

## 2022-09-12 NOTE — Progress Notes (Signed)
Physical Therapy Treatment Patient Details Name: Nancy Simmons MRN: [id number] DOB: 05-21-1956 Today's Date: 09/12/2022   History of Present Illness Pt is a 67 y/o female presenting on 2/19 for same day PLIF L5-S1.  PMH includes: prior lumbar fusion, arthritis, asthma, COPD, CAD, HTN, OSA on CPAP, DM2, L THA, B TKA, B rotator cuff repair.    PT Comments    Pt with regression towards physical therapy goals. Demonstrates increased lethargy and frequently falling asleep while sitting up and walking; able to arouse with min verbal stimulation, however, pt could not sustain alertness. Pt also with expressive aphasia and word finding difficulties in comparison to yesterday (? Due to lethargy); RN notified. Pt ambulating ~40 ft with a walker at a min assist level. Will continue efforts.    Recommendations for follow up therapy are one component of a multi-disciplinary discharge planning process, led by the attending physician.  Recommendations may be updated based on patient status, additional functional criteria and insurance authorization.  Follow Up Recommendations  Skilled nursing-short term rehab (<3 hours/day) Can patient physically be transported by private vehicle: Yes   Assistance Recommended at Discharge Intermittent Supervision/Assistance  Patient can return home with the following Assistance with cooking/housework;Help with stairs or ramp for entrance;Assist for transportation   Equipment Recommendations  None recommended by PT    Recommendations for Other Services       Precautions / Restrictions Precautions Precautions: Back;Fall Precaution Booklet Issued: No Precaution Comments: reviewed with pt Required Braces or Orthoses: Spinal Brace Spinal Brace: Lumbar corset;Applied in sitting position Restrictions Weight Bearing Restrictions: No     Mobility  Bed Mobility Overal bed mobility: Needs Assistance Bed Mobility: Rolling, Sidelying to Sit, Sit to Sidelying Rolling:  Min assist Sidelying to sit: Min assist     Sit to sidelying: Mod assist General bed mobility comments: Assist for initiation and trunk to upright, heavy use of bed rail. ModA for BLE elevation back into bed. +2 to boost up in bed    Transfers Overall transfer level: Needs assistance Equipment used: Rolling walker (2 wheels) Transfers: Sit to/from Stand Sit to Stand: Min assist           General transfer comment: MinA to rise from edge of bed    Ambulation/Gait Ambulation/Gait assistance: Min assist Gait Distance (Feet): 40 Feet Assistive device: Rolling walker (2 wheels) Gait Pattern/deviations: Step-through pattern, Decreased stride length Gait velocity: decreased Gait velocity interpretation: <1.31 ft/sec, indicative of household ambulator   General Gait Details: Pt frequently closing her eyes during gait, minA for steering walker, and cues for stepping initiation and progression   Stairs             Wheelchair Mobility    Modified Rankin (Stroke Patients Only)       Balance Overall balance assessment: Needs assistance Sitting-balance support: No upper extremity supported, Feet supported Sitting balance-Leahy Scale: Good     Standing balance support: Bilateral upper extremity supported, During functional activity Standing balance-Leahy Scale: Poor Standing balance comment: relies on UE support                            Cognition Arousal/Alertness: Awake/alert Behavior During Therapy: WFL for tasks assessed/performed Overall Cognitive Status: Impaired/Different from baseline Area of Impairment: Memory, Following commands, Attention, Safety/judgement, Problem solving                   Current Attention Level: Sustained Memory: Decreased short-term memory Following  Commands: Follows one step commands inconsistently, Follows one step commands with increased time Safety/Judgement: Decreased awareness of deficits     General  Comments: Increased lethargy, frequently falling asleep during session, pt with expressive aphasia and word finding difficulties        Exercises      General Comments        Pertinent Vitals/Pain Pain Assessment Pain Assessment: Faces Faces Pain Scale: Hurts little more Pain Location: back- incisional Pain Descriptors / Indicators: Discomfort, Operative site guarding Pain Intervention(s): Monitored during session    Home Living                          Prior Function            PT Goals (current goals can now be found in the care plan section) Acute Rehab PT Goals Patient Stated Goal: go to rehab PT Goal Formulation: All assessment and education complete, DC therapy    Frequency    Min 3X/week      PT Plan Current plan remains appropriate    Co-evaluation              AM-PAC PT "6 Clicks" Mobility   Outcome Measure  Help needed turning from your back to your side while in a flat bed without using bedrails?: A Little Help needed moving from lying on your back to sitting on the side of a flat bed without using bedrails?: A Little Help needed moving to and from a bed to a chair (including a wheelchair)?: A Little Help needed standing up from a chair using your arms (e.g., wheelchair or bedside chair)?: A Little Help needed to walk in hospital room?: A Little Help needed climbing 3-5 steps with a railing? : A Little 6 Click Score: 18    End of Session Equipment Utilized During Treatment: Back brace Activity Tolerance: Patient limited by lethargy Patient left: in bed;with bed alarm set;with call bell/phone within reach Nurse Communication: Mobility status PT Visit Diagnosis: Unsteadiness on feet (R26.81);Pain Pain - part of body:  (back)     Time: [id number] PT Time Calculation (min) (ACUTE ONLY): 28 min  Charges:  $Therapeutic Activity: 23-37 mins                     Nancy Simmons, PT, DPT Acute Rehabilitation Services Office  [phone]    Nancy Simmons 09/12/2022, 1:46 PM

## 2022-09-12 NOTE — TOC Progression Note (Addendum)
Transition of Care [facility]) - Progression Note    Patient Details  Name: Nancy Simmons MRN: [id number] Date of Birth: 1956-07-15  [facility] of [facility] CM/SW Contact  Pollie Friar, RN Phone Number: 09/12/2022, 8:33 AM  Clinical Narrative:    [facility] has denied for SNF rehab. CM will update the patient this am. Currently she has no bed offers. Will ask about faxing her out further.  TOC following.  1244: [facility] has offered but wont have bed until Friday. [facility] wont have a bed until Tuesday. Pt has accepted [facility]. CM has asked the [facility] MOA to begin insurance auth for Friday admit to SNF rehab.   Expected Discharge Plan: [place] Barriers to Discharge: Continued Medical Work up, SNF Pending bed offer  Expected Discharge Plan and Services In-house Referral: Clinical Social Work Discharge Planning Services: CM Consult Post Acute Care Choice: [place] arrangements for the past 2 months: Single Family Home                                       Social Determinants of Health (SDOH) Interventions SDOH Screenings   Food Insecurity: No Food Insecurity (09/10/2022)  Housing: Low Risk  (09/10/2022)  Transportation Needs: No Transportation Needs (09/10/2022)  Utilities: Not At Risk (09/10/2022)  Tobacco Use: Medium Risk (09/10/2022)    Readmission Risk Interventions     No data to display

## 2022-09-12 NOTE — Significant Event (Signed)
Rapid Response Event Note   Reason for Call :  Stroke like symptoms, confusion  Initial Focused Assessment:  Pt laying in bed with eyes open, skim warm and dry to touch, in no acute distress. Pt A/O x2 (knows name, and where she is) but unable to answer date. Bedside RN reports this is new for pt  Bedside RN reports pt did not wear her CPAP last night due to difficulties with the mask.  CBG 145 BP 151/75 (92) HR 80 O2 90-92% RR 15 Temp 99.6  Interventions:  NIH 1 ABG (7.39/42/70/25.4) Stat CBC, BMET  Plan of Care:  Waiting on labs Call RRT if futher assistance need  Event Summary:   MD Notified: Dr Kathyrn Sheriff Call Time: 0536 Arrival Time: [id number] End Time: [id number]  Mervyn Gay, RN

## 2022-09-12 NOTE — Progress Notes (Signed)
Patient was found wondering in the room confused. NIH assessment score 1 with cognitive delayed response only. Vitals signs stable, patient not in distress. RRRN notified, ABG STAT obtain, Neuro and neuro surgery notified by RRRN. STAT labs ordered. Patient is sleeping with CPAP on.

## 2022-09-12 NOTE — Plan of Care (Signed)
  Problem: Coping: Goal: Level of anxiety will decrease Outcome: Progressing   Problem: Activity: Goal: Will remain free from falls Outcome: Progressing   Problem: Education: Goal: Knowledge of General Education information will improve Description: Including pain rating scale, medication(s)/side effects and non-pharmacologic comfort measures Outcome: Not Progressing   Problem: Health Behavior/Discharge Planning: Goal: Ability to manage health-related needs will improve Outcome: Not Progressing   Problem: Activity: Goal: Risk for activity intolerance will decrease Outcome: Not Progressing

## 2022-09-12 NOTE — Progress Notes (Signed)
Subjective: The patient is alert and pleasant.  She has no complaints.  We are awaiting skilled nursing facility placement.  Objective: Vital signs in last 24 hours: Temp:  [97.7 F (36.5 C)-100.1 F (37.8 C)] 99.2 F (37.3 C) (02/21 0817) Pulse Rate:  [58-82] 74 (02/21 0817) Resp:  [14-20] 20 (02/21 0817) BP: (123-157)/(50-76) 134/71 (02/21 0817) SpO2:  [91 %-100 %] 96 % (02/21 0817) Estimated body mass index is 55.05 kg/m as calculated from the following:   Height as of this encounter: 5' 2"$  (1.575 m).   Weight as of this encounter: 136.5 kg.   Intake/Output from previous day: 02/20 0701 - 02/21 0700 In: -  Out: 300 [Urine:300] Intake/Output this shift: No intake/output data recorded.  Physical exam the patient is alert and pleasant.  Her strength is normal.  Lab Results: Recent Labs    09/12/22 0826  WBC 10.7*  HGB 10.9*  HCT 33.9*  PLT 163   BMET Recent Labs    09/12/22 0703  NA 139  K 3.6  CL 106  CO2 22  GLUCOSE 134*  BUN 14  CREATININE 0.67  CALCIUM 8.9    Studies/Results: DG Lumbar Spine 2-3 Views  Result Date: 09/10/2022 CLINICAL DATA:  Elective surgery. EXAM: LUMBAR SPINE - 2-3 VIEW COMPARISON:  Lumbar myelogram 04/05/2022 FLUOROSCOPY: Radiation Exposure Index (as provided by the fluoroscopic device): 11.60 mGy Kerma FINDINGS: 2 intraoperative spot fluoroscopic images of the lumbosacral junction are provided. Previous posterior and interbody fusion are noted at L4-5. New pedicle screws are in place at S1, and interbody spacer has been placed at L5-S1. Tissue retractors are in place posteriorly. IMPRESSION: Intraoperative images during L5-S1 fusion. Electronically Signed   By: Logan Bores M.D.   On: 09/10/2022 15:15   DG C-Arm 1-60 Min-No Report  Result Date: 09/10/2022 Fluoroscopy was utilized by the requesting physician.  No radiographic interpretation.    Assessment/Plan: Postop day #2: We are awaiting skilled nursing facility placement.   LOS: 2 days     Nancy Simmons 09/12/2022, 9:02 AM     Patient ID: Nancy Simmons, female   DOB: July 23, 1956, 67 y.o.   MRN: [id number]

## 2022-09-13 NOTE — Progress Notes (Signed)
Subjective: The patient is alert and pleasant.  She has no complaints.  Objective: Vital signs in last 24 hours: Temp:  [99 F (37.2 C)-100.1 F (37.8 C)] 100.1 F (37.8 C) (02/22 0754) Pulse Rate:  [63-89] 89 (02/22 0754) Resp:  [18-20] 20 (02/22 0754) BP: (101-161)/(57-79) 148/57 (02/22 0754) SpO2:  [94 %-98 %] 98 % (02/22 0754) FiO2 (%):  [21 %] 21 % (02/21 2332) Estimated body mass index is 55.05 kg/m as calculated from the following:   Height as of this encounter: 5' 2"$  (1.575 m).   Weight as of this encounter: 136.5 kg.   Intake/Output from previous day: 02/21 0701 - 02/22 0700 In: 120 [P.O.:120] Out: 950 [Urine:950] Intake/Output this shift: No intake/output data recorded.  Physical exam the patient is alert and pleasant.  Her strength is normal.  Her wound is healing well.  Lab Results: Recent Labs    09/12/22 0826 09/12/22 1458  WBC 10.7* 9.7  HGB 10.9* 10.0*  HCT 33.9* 31.9*  PLT 163 155   BMET Recent Labs    09/12/22 0703 09/12/22 1458  NA 139 140  K 3.6 3.7  CL 106 107  CO2 22 25  GLUCOSE 134* 156*  BUN 14 14  CREATININE 0.67 0.69  CALCIUM 8.9 8.7*    Studies/Results: DG CHEST PORT 1 VIEW  Result Date: 09/12/2022 CLINICAL DATA:  Altered mental status.  Recent back surgery. EXAM: PORTABLE CHEST 1 VIEW COMPARISON:  December 24, 2020 FINDINGS: Cardiomediastinal contours accentuated by AP projection, remain enlarged potentially slightly increased since previous imaging with respect to heart size though this could be projectional. Central pulmonary vascular engorgement. No lobar level consolidative process. Patchy linear opacities at the LEFT lung base. No pneumothorax. Spinal nerve stimulator seen in the area of the midthoracic spine. On limited assessment no acute skeletal process. IMPRESSION: 1. Patchy linear opacities at the LEFT lung base, likely atelectasis. 2. Cardiomegaly, accentuated by AP projection but potentially increased since previous imaging  with pulmonary vascular engorgement. Electronically Signed   By: Zetta Bills M.D.   On: 09/12/2022 13:24    Assessment/Plan: Postop day #3: We will continue to mobilize the patient with PT.  We are awaiting skilled nursing facility placement.  LOS: 3 days     [name] 09/13/2022, 8:09 AM     Patient ID: Nancy Simmons, female   DOB: 02-12-1956, 67 y.o.   MRN: [id number]

## 2022-09-13 NOTE — Progress Notes (Signed)
Daughter called to check on mom because she hadn't been able to talk to her by phone tonight because she hasn't answered her cell or the rm phone. I told her daughter I would take my phone down there and check and see where her phones were and make sure she had them. I explained I had just left her rm and she was doing ok same as what day shift nurse stated she had been today.  I put both phones close to pt and told her where they were and allowed them to talk for a few minutes.

## 2022-09-13 NOTE — Plan of Care (Signed)
Pt with expressive aphasia, confusion,  and word searching r/t ??? UTI. Pt will attempt to correct herself when she is saying things wrong but then says exactly the same thing. Pt given Macrobid tonight for her UTI. Pt moves self all over the bed turning completely from side to side frequently. No brace while in bed.

## 2022-09-13 NOTE — Progress Notes (Signed)
ED nurse called and stated granddaughter was here to talk with me because her grandma didn't sound right on the phone.

## 2022-09-13 NOTE — Progress Notes (Signed)
Met granddaughter outside the rm and talked with her there and in the rm with the pt. Granddaughter stated pt was acting confused and wasn't talking right and wanted to know what pain meds and other meds I had given pt. I explained the day shift nurse told me in report pt had been this way all day and it had actually started last night. The doctor was informed and did some labs and the pt now has a UTI and was started on an antibiotic by mouth earlier today. Granddaughter asked if that is normal with a UTI. I told her yes and sometimes they get even more confused, start falling, and have other problems. I told the granddaughter the only pain med I gave her was the Morphine she has ordered that she takes at home and the only other meds she's had tonight are the ones she takes at home and that are scheduled. Granddaughter requested CPaP to be placed back on pt. I asked pt if she wanted me to put it back on and she said "yes" so I put the CPaP back on the pt. Granddaughter and pt both stated they didn't need anything else.

## 2022-09-13 NOTE — Care Management Important Message (Signed)
Important Message  Patient Details  Name: Nancy Simmons MRN: [id number] Date of Birth: Jun 08, 1956   Medicare Important Message Given:  Yes     Orbie Pyo 09/13/2022, 3:56 PM

## 2022-09-14 DIAGNOSIS — Z7401 Bed confinement status: Secondary | ICD-10-CM | POA: Diagnosis not present

## 2022-09-14 DIAGNOSIS — Z23 Encounter for immunization: Secondary | ICD-10-CM | POA: Diagnosis not present

## 2022-09-14 DIAGNOSIS — Z743 Need for continuous supervision: Secondary | ICD-10-CM | POA: Diagnosis not present

## 2022-09-14 DIAGNOSIS — M6281 Muscle weakness (generalized): Secondary | ICD-10-CM | POA: Diagnosis not present

## 2022-09-14 DIAGNOSIS — E785 Hyperlipidemia, unspecified: Secondary | ICD-10-CM | POA: Diagnosis not present

## 2022-09-14 DIAGNOSIS — M4326 Fusion of spine, lumbar region: Secondary | ICD-10-CM | POA: Diagnosis not present

## 2022-09-14 DIAGNOSIS — E1169 Type 2 diabetes mellitus with other specified complication: Secondary | ICD-10-CM | POA: Diagnosis not present

## 2022-09-14 DIAGNOSIS — R531 Weakness: Secondary | ICD-10-CM | POA: Diagnosis not present

## 2022-09-14 DIAGNOSIS — R262 Difficulty in walking, not elsewhere classified: Secondary | ICD-10-CM | POA: Diagnosis not present

## 2022-09-14 DIAGNOSIS — Z9889 Other specified postprocedural states: Secondary | ICD-10-CM | POA: Diagnosis not present

## 2022-09-14 MED ORDER — PREGABALIN 100 MG PO CAPS: 100.0000 mg | ORAL_CAPSULE | ORAL | 0 refills | Status: DC

## 2022-09-14 MED ORDER — MORPHINE SULFATE 15 MG PO TABS: 15.0000 mg | ORAL_TABLET | Freq: Two times a day (BID) | ORAL | 0 refills | Status: DC

## 2022-09-14 MED ORDER — OXYCODONE HCL 10 MG PO TABS: 10.0000 mg | ORAL_TABLET | ORAL | 0 refills | Status: DC | PRN

## 2022-09-14 MED ORDER — NITROFURANTOIN MONOHYD MACRO 100 MG PO CAPS: 100.0000 mg | ORAL_CAPSULE | Freq: Two times a day (BID) | ORAL | 0 refills | Status: DC

## 2022-09-14 NOTE — TOC Transition Note (Signed)
Transition of Care [facility]) - CM/SW Discharge Note   Patient Details  Name: Nancy Simmons MRN: [id number] Date of Birth: 04/21/1956  [facility]) CM/SW Contact:  Pollie Friar, RN Phone Number: 09/14/2022, 11:50 AM   Clinical Narrative:    Pt is discharging to [facility]. She will transport via [facility] [place]. Pt aware and to call her family. Her family to bring CPAP to [facility].  Bedside Rn updated and d/c packet is at the desk.   Number for report: [phone]   Final next level of care: Skilled Nursing Facility Barriers to Discharge: No Barriers Identified   Patient Goals and CMS Choice CMS Medicare.gov Compare Post Acute Care list provided to:: Patient Choice offered to / list presented to : Patient  Discharge Placement                Patient chooses bed at: [facility] Patient to be transferred to facility by: [place] Name of family member notified: Pt to call family Patient and family notified of of transfer: 09/14/22  Discharge Plan and Services Additional resources added to the After Visit Summary for   In-house Referral: Clinical Social Work Discharge Planning Services: CM Consult Post Acute Care Choice: [place]                               Social Determinants of Health (SDOH) Interventions SDOH Screenings   Food Insecurity: No Food Insecurity (09/10/2022)  Housing: Low Risk  (09/10/2022)  Transportation Needs: No Transportation Needs (09/10/2022)  Utilities: Not At Risk (09/10/2022)  Tobacco Use: Medium Risk (09/10/2022)     Readmission Risk Interventions     No data to display

## 2022-09-14 NOTE — Discharge Summary (Signed)
Physician Discharge Summary  Patient ID: Nancy Simmons MRN: [id number] DOB/AGE: Oct 14, 1955 67 y.o.  Admit date: 09/10/2022 Discharge date: 09/14/2022  Admission Diagnoses: Lumbosacral foraminal stenosis, lumbosacral radiculopathy, lumbago, neurogenic location  Discharge Diagnoses: The same Principal Problem:   Status post lumbar spinal fusion   Discharged Condition: good  Hospital Course: I performed an L5-S1 decompression, instrumentation and fusion on the patient on 09/10/2022.  The surgery went well.  The patient had low-grade fevers.  A UA was suspicious for infection.  She was started on Macrobid.  Otherwise, the patient's postoperative course was unremarkable.  On 09/14/2022 a skilled nurse facility facility bed was obtained at [facility] in [place].  She was discharged.  The patient was given verbal and written discharge instructions.  All her questions were answered.  Consults: PT, care management Significant Diagnostic Studies: None Treatments: Exploration of lumbar fusion, L5-S1 decompression, instrumentation and fusion. Discharge Exam: Blood pressure (!) 146/71, pulse 74, temperature 98.9 F (37.2 C), temperature source Oral, resp. rate 18, height '5\' 2"'$  (1.575 m), weight (!) 136.5 kg, SpO2 95 %. The patient is alert and pleasant.  Her strength is normal.  Her wound is healing well.  Disposition: [facility] in [place]  Discharge Instructions     Call MD for:  difficulty breathing, headache or visual disturbances   Complete by: As directed    Call MD for:  extreme fatigue   Complete by: As directed    Call MD for:  hives   Complete by: As directed    Call MD for:  persistant dizziness or light-headedness   Complete by: As directed    Call MD for:  persistant nausea and vomiting   Complete by: As directed    Call MD for:  redness, tenderness, or signs of infection (pain, swelling, redness, odor or green/yellow discharge around incision site)   Complete by: As directed     Call MD for:  severe uncontrolled pain   Complete by: As directed    Call MD for:  temperature >100.4   Complete by: As directed    Diet - low sodium heart healthy   Complete by: As directed    Discharge instructions   Complete by: As directed    Call [phone] for a followup appointment. Take a stool softener while you are using pain medications.   Driving Restrictions   Complete by: As directed    Do not drive for 2 weeks.   Increase activity slowly   Complete by: As directed    Lifting restrictions   Complete by: As directed    Do not lift more than 5 pounds. No excessive bending or twisting.   May shower / Bathe   Complete by: As directed    Remove the dressing for 3 days after surgery.  You may shower, but leave the incision alone.   No dressing needed   Complete by: As directed       Allergies as of 09/14/2022       Reactions   Acetaminophen Other (See Comments)   PATIENT HAS AUTOIMMUNE HEPATITIS PATIENT IS * NOT * TO RECEIVE ANY ACETAMINOPHEN   Penicillin G Rash, Other (See Comments)   Has patient had a PCN reaction causing immediate rash, facial/tongue/throat swelling, SOB or lightheadedness with hypotension: No Has patient had a PCN reaction causing severe rash involving mucus membranes or skin necrosis: No Has patient had a PCN reaction that required hospitalization: No Has patient had a PCN reaction occurring within the last 10  years: #  #  #  YES  #  #  #        Medication List     STOP taking these medications    aspirin EC 81 MG tablet   meloxicam 7.5 MG tablet Commonly known as: MOBIC       TAKE these medications    albuterol (2.5 MG/3ML) 0.083% nebulizer solution Commonly known as: PROVENTIL Take 2.5 mg by nebulization 3 (three) times daily.   albuterol 108 (90 Base) MCG/ACT inhaler Commonly known as: VENTOLIN HFA Inhale 1-2 puffs into the lungs every 6 (six) hours as needed for wheezing or shortness of breath.   amLODipine 5 MG  tablet Commonly known as: NORVASC Take 5 mg by mouth daily.   Anoro Ellipta 62.5-25 MCG/ACT Aepb Generic drug: umeclidinium-vilanterol Inhale 1 puff into the lungs daily.   apixaban 5 MG Tabs tablet Commonly known as: Eliquis Take 1 tablet (5 mg total) by mouth 2 (two) times daily. Restart on 09/07/2019   Belsomra 10 MG Tabs Generic drug: Suvorexant Take 10 mg by mouth at bedtime as needed (sleep).   Bevespi Aerosphere 9-4.8 MCG/ACT Aero Generic drug: Glycopyrrolate-Formoterol Inhale 2 puffs into the lungs 2 (two) times daily.   diclofenac sodium 1 % Gel Commonly known as: VOLTAREN Apply 1 application topically 4 (four) times daily as needed (for pain).   escitalopram 10 MG tablet Commonly known as: LEXAPRO Take 10 mg by mouth daily.   fluticasone 50 MCG/ACT nasal spray Commonly known as: FLONASE Place 2 sprays into both nostrils daily.   hydrALAZINE 10 MG tablet Commonly known as: APRESOLINE Take 10 mg by mouth 4 (four) times daily.   linaclotide 145 MCG Caps capsule Commonly known as: LINZESS Take 145 mcg by mouth daily before breakfast.   liraglutide 18 MG/3ML Sopn Commonly known as: VICTOZA Inject 1.8 mg into the skin daily.   meclizine 25 MG tablet Commonly known as: ANTIVERT Take 25 mg by mouth 3 (three) times daily as needed for dizziness.   metoprolol tartrate 50 MG tablet Commonly known as: LOPRESSOR Take 50 mg by mouth 2 (two) times daily.   morphine 15 MG tablet Commonly known as: MSIR Take 15 mg by mouth 2 (two) times daily.   MULTIVITAMIN GUMMIES WOMENS PO Take 2 each by mouth daily.   nitrofurantoin (macrocrystal-monohydrate) 100 MG capsule Commonly known as: MACROBID Take 1 capsule (100 mg total) by mouth every 12 (twelve) hours.   Oxycodone HCl 10 MG Tabs Take 1 tablet (10 mg total) by mouth every 4 (four) hours as needed ((score 7 to 10)). What changed: Another medication with the same name was added. Make sure you understand how and  when to take each.   Oxycodone HCl 10 MG Tabs Take 1 tablet (10 mg total) by mouth every 3 (three) hours as needed for severe pain ((score 7 to 10)). What changed: You were already taking a medication with the same name, and this prescription was added. Make sure you understand how and when to take each.   pantoprazole 40 MG tablet Commonly known as: PROTONIX Take 40 mg by mouth daily.   pregabalin 100 MG capsule Commonly known as: LYRICA Take 100-200 mg by mouth See admin instructions. Taking 100 mg in the AM and 200 mg at bedtime   rOPINIRole 1 MG tablet Commonly known as: REQUIP Take 1 mg by mouth at bedtime.   rosuvastatin 5 MG tablet Commonly known as: CRESTOR Take 5 mg by mouth daily.  tiZANidine 2 MG tablet Commonly known as: ZANAFLEX Take 1 tablet (2 mg total) by mouth every 8 (eight) hours as needed for muscle spasms.   Trilipix 135 MG capsule Generic drug: Choline Fenofibrate Take 135 mg by mouth every evening.   Vitamin D (Ergocalciferol) 1.25 MG (50000 UNIT) Caps capsule Commonly known as: DRISDOL Take 50,000 Units by mouth every 7 (seven) days. 'Sunday               Discharge Care Instructions  (From admission, onward)           Start     Ordered   09/14/22 0000  No dressing needed        02'$ /23/24 [id number]             Signed: [name] 09/14/2022, 9:49 AM

## 2022-09-14 NOTE — Progress Notes (Signed)
Mobility Specialist: Progress Note   09/14/22 1053  Mobility  Activity Ambulated with assistance in hallway  Level of Assistance Minimal assist, patient does 75% or more  Assistive Device Front wheel walker  Distance Ambulated (ft) 240 ft  Activity Response Tolerated well  Mobility Referral Yes  $Mobility charge 1 Mobility   Pt received in the bed and agreeable to mobility. MinA with bed mobility and contact guard during ambulation. To BR per request, BM successful, then hallway ambulation. Stopped x2 for standing breaks secondary to fatigue. Pt back to bed after session with call bell and phone at her side. Bed alarm is on.   [place] Dezeray Puccio Mobility Specialist Please contact via SecureChat or Rehab office at [phone]

## 2022-09-14 NOTE — Plan of Care (Signed)

## 2022-09-14 NOTE — Plan of Care (Signed)
Pt is alert and oriented x4 tonight with clear speech. Extremity strength is 5/5. Pt doesn't remember the last 2 days when she was confused and speech was off. Pt has been compliant with using her CPaP all shift thus far.

## 2022-09-14 NOTE — TOC Progression Note (Addendum)
Transition of Care [facility]) - Progression Note    Patient Details  Name: Nancy Simmons MRN: [id number] Date of Birth: 17-Jun-1956  [facility]) CM/SW Contact  Pollie Friar, RN Phone Number: 09/14/2022, 8:32 AM  Clinical Narrative:    Pt has insurance approval and can d/c to [facility] [place] today.   AuthVT:[id number] Good 09/13/2022-09/17/2022  TOC following.   Expected Discharge Plan: [place] Barriers to Discharge: Continued Medical Work up, SNF Pending bed offer  Expected Discharge Plan and Services In-house Referral: Clinical Social Work Discharge Planning Services: CM Consult Post Acute Care Choice: [place] arrangements for the past 2 months: Single Family Home                                       Social Determinants of Health (SDOH) Interventions SDOH Screenings   Food Insecurity: No Food Insecurity (09/10/2022)  Housing: Low Risk  (09/10/2022)  Transportation Needs: No Transportation Needs (09/10/2022)  Utilities: Not At Risk (09/10/2022)  Tobacco Use: Medium Risk (09/10/2022)    Readmission Risk Interventions     No data to display

## 2022-09-17 DIAGNOSIS — R262 Difficulty in walking, not elsewhere classified: Secondary | ICD-10-CM | POA: Diagnosis not present

## 2022-09-17 DIAGNOSIS — Z9889 Other specified postprocedural states: Secondary | ICD-10-CM | POA: Diagnosis not present

## 2022-09-17 DIAGNOSIS — E1169 Type 2 diabetes mellitus with other specified complication: Secondary | ICD-10-CM | POA: Diagnosis not present

## 2022-09-17 DIAGNOSIS — E785 Hyperlipidemia, unspecified: Secondary | ICD-10-CM | POA: Diagnosis not present

## 2022-09-17 LAB — CULTURE, BLOOD (ROUTINE X 2)
Culture: NO GROWTH
Culture: NO GROWTH

## 2022-09-21 ENCOUNTER — Encounter (HOSPITAL_COMMUNITY): Payer: Self-pay

## 2022-09-21 ENCOUNTER — Other Ambulatory Visit: Payer: Self-pay | Admitting: Neurosurgery

## 2022-09-21 ENCOUNTER — Inpatient Hospital Stay (HOSPITAL_COMMUNITY)
Admission: EM | Admit: 2022-09-21 | Discharge: 2022-10-01 | DRG: 857 | Disposition: A | Discharge status: Skilled Nursing Facility | Payer: 59 | Attending: Neurosurgery | Admitting: Neurosurgery

## 2022-09-21 ENCOUNTER — Other Ambulatory Visit: Payer: Self-pay

## 2022-09-21 DIAGNOSIS — B965 Pseudomonas (aeruginosa) (mallei) (pseudomallei) as the cause of diseases classified elsewhere: Secondary | ICD-10-CM | POA: Diagnosis present

## 2022-09-21 DIAGNOSIS — Z87442 Personal history of urinary calculi: Secondary | ICD-10-CM | POA: Diagnosis not present

## 2022-09-21 DIAGNOSIS — E119 Type 2 diabetes mellitus without complications: Secondary | ICD-10-CM | POA: Diagnosis present

## 2022-09-21 DIAGNOSIS — E7800 Pure hypercholesterolemia, unspecified: Secondary | ICD-10-CM | POA: Diagnosis present

## 2022-09-21 DIAGNOSIS — Z886 Allergy status to analgesic agent status: Secondary | ICD-10-CM

## 2022-09-21 DIAGNOSIS — A4901 Methicillin susceptible Staphylococcus aureus infection, unspecified site: Secondary | ICD-10-CM

## 2022-09-21 DIAGNOSIS — B9561 Methicillin susceptible Staphylococcus aureus infection as the cause of diseases classified elsewhere: Secondary | ICD-10-CM | POA: Diagnosis present

## 2022-09-21 DIAGNOSIS — Z96642 Presence of left artificial hip joint: Secondary | ICD-10-CM | POA: Diagnosis present

## 2022-09-21 DIAGNOSIS — K754 Autoimmune hepatitis: Secondary | ICD-10-CM | POA: Diagnosis present

## 2022-09-21 DIAGNOSIS — T8142XA Infection following a procedure, deep incisional surgical site, initial encounter: Secondary | ICD-10-CM | POA: Diagnosis present

## 2022-09-21 DIAGNOSIS — Z981 Arthrodesis status: Secondary | ICD-10-CM | POA: Diagnosis not present

## 2022-09-21 DIAGNOSIS — Z8249 Family history of ischemic heart disease and other diseases of the circulatory system: Secondary | ICD-10-CM | POA: Diagnosis not present

## 2022-09-21 DIAGNOSIS — Z880 Allergy status to penicillin: Secondary | ICD-10-CM

## 2022-09-21 DIAGNOSIS — I2510 Atherosclerotic heart disease of native coronary artery without angina pectoris: Secondary | ICD-10-CM | POA: Diagnosis present

## 2022-09-21 DIAGNOSIS — Z87891 Personal history of nicotine dependence: Secondary | ICD-10-CM | POA: Diagnosis not present

## 2022-09-21 DIAGNOSIS — I25119 Atherosclerotic heart disease of native coronary artery with unspecified angina pectoris: Secondary | ICD-10-CM | POA: Diagnosis not present

## 2022-09-21 DIAGNOSIS — Z823 Family history of stroke: Secondary | ICD-10-CM

## 2022-09-21 DIAGNOSIS — L7682 Other postprocedural complications of skin and subcutaneous tissue: Secondary | ICD-10-CM | POA: Diagnosis not present

## 2022-09-21 DIAGNOSIS — K219 Gastro-esophageal reflux disease without esophagitis: Secondary | ICD-10-CM | POA: Diagnosis present

## 2022-09-21 DIAGNOSIS — Z833 Family history of diabetes mellitus: Secondary | ICD-10-CM | POA: Diagnosis not present

## 2022-09-21 DIAGNOSIS — Z751 Person awaiting admission to adequate facility elsewhere: Secondary | ICD-10-CM

## 2022-09-21 DIAGNOSIS — G4733 Obstructive sleep apnea (adult) (pediatric): Secondary | ICD-10-CM | POA: Diagnosis present

## 2022-09-21 DIAGNOSIS — I10 Essential (primary) hypertension: Secondary | ICD-10-CM | POA: Diagnosis present

## 2022-09-21 DIAGNOSIS — Z96612 Presence of left artificial shoulder joint: Secondary | ICD-10-CM | POA: Diagnosis present

## 2022-09-21 DIAGNOSIS — J449 Chronic obstructive pulmonary disease, unspecified: Secondary | ICD-10-CM | POA: Diagnosis not present

## 2022-09-21 DIAGNOSIS — F17290 Nicotine dependence, other tobacco product, uncomplicated: Secondary | ICD-10-CM | POA: Diagnosis present

## 2022-09-21 DIAGNOSIS — T8140XA Infection following a procedure, unspecified, initial encounter: Secondary | ICD-10-CM | POA: Diagnosis not present

## 2022-09-21 DIAGNOSIS — J4489 Other specified chronic obstructive pulmonary disease: Secondary | ICD-10-CM | POA: Diagnosis present

## 2022-09-21 DIAGNOSIS — T847XXD Infection and inflammatory reaction due to other internal orthopedic prosthetic devices, implants and grafts, subsequent encounter: Secondary | ICD-10-CM | POA: Diagnosis not present

## 2022-09-21 DIAGNOSIS — Z96653 Presence of artificial knee joint, bilateral: Secondary | ICD-10-CM | POA: Diagnosis present

## 2022-09-21 DIAGNOSIS — Z6841 Body Mass Index (BMI) 40.0 and over, adult: Secondary | ICD-10-CM

## 2022-09-21 DIAGNOSIS — E66813 Obesity, class 3: Secondary | ICD-10-CM

## 2022-09-21 DIAGNOSIS — T847XXA Infection and inflammatory reaction due to other internal orthopedic prosthetic devices, implants and grafts, initial encounter: Secondary | ICD-10-CM | POA: Diagnosis present

## 2022-09-21 DIAGNOSIS — Z800 Family history of malignant neoplasm of digestive organs: Secondary | ICD-10-CM

## 2022-09-21 LAB — HIV ANTIBODY (ROUTINE TESTING W REFLEX): HIV Screen 4th Generation wRfx: NONREACTIVE

## 2022-09-21 LAB — URINALYSIS, COMPLETE (UACMP) WITH MICROSCOPIC
Bilirubin Urine: NEGATIVE
Glucose, UA: NEGATIVE mg/dL
Hgb urine dipstick: NEGATIVE
Ketones, ur: NEGATIVE mg/dL
Nitrite: NEGATIVE
Protein, ur: NEGATIVE mg/dL
Specific Gravity, Urine: 1.012 (ref 1.005–1.030)
pH: 7 (ref 5.0–8.0)

## 2022-09-21 LAB — CBC WITH DIFFERENTIAL/PLATELET
Abs Immature Granulocytes: 0.05 10*3/uL (ref 0.00–0.07)
Basophils Absolute: 0 10*3/uL (ref 0.0–0.1)
Basophils Relative: 0 %
Eosinophils Absolute: 0.3 10*3/uL (ref 0.0–0.5)
Eosinophils Relative: 3 %
HCT: 33.4 % — ABNORMAL LOW (ref 36.0–46.0)
Hemoglobin: 10.4 g/dL — ABNORMAL LOW (ref 12.0–15.0)
Immature Granulocytes: 0 %
Lymphocytes Relative: 29 %
Lymphs Abs: 3.2 10*3/uL (ref 0.7–4.0)
MCH: 30.2 pg (ref 26.0–34.0)
MCHC: 31.1 g/dL (ref 30.0–36.0)
MCV: 97.1 fL (ref 80.0–100.0)
Monocytes Absolute: 1 10*3/uL (ref 0.1–1.0)
Monocytes Relative: 9 %
Neutro Abs: 6.7 10*3/uL (ref 1.7–7.7)
Neutrophils Relative %: 59 %
Platelets: 305 10*3/uL (ref 150–400)
RBC: 3.44 MIL/uL — ABNORMAL LOW (ref 3.87–5.11)
RDW: 14.5 % (ref 11.5–15.5)
WBC: 11.3 10*3/uL — ABNORMAL HIGH (ref 4.0–10.5)
nRBC: 0 % (ref 0.0–0.2)

## 2022-09-21 LAB — BASIC METABOLIC PANEL
Anion gap: 9 (ref 5–15)
BUN: 8 mg/dL (ref 8–23)
CO2: 24 mmol/L (ref 22–32)
Calcium: 8.9 mg/dL (ref 8.9–10.3)
Chloride: 106 mmol/L (ref 98–111)
Creatinine, Ser: 0.6 mg/dL (ref 0.44–1.00)
GFR, Estimated: >60 mL/min (ref >60)
Glucose, Bld: 107 mg/dL — ABNORMAL HIGH (ref 70–99)
Potassium: 3.8 mmol/L (ref 3.5–5.1)
Sodium: 139 mmol/L (ref 135–145)

## 2022-09-21 LAB — C-REACTIVE PROTEIN: CRP: 2.7 mg/dL — ABNORMAL HIGH (ref <1.0)

## 2022-09-21 LAB — GLUCOSE, CAPILLARY
Glucose-Capillary: 91 mg/dL (ref 70–99)
Glucose-Capillary: 121 mg/dL — ABNORMAL HIGH (ref 70–99)

## 2022-09-21 LAB — SEDIMENTATION RATE: Sed Rate: 76 mm/hr — ABNORMAL HIGH (ref 0–22)

## 2022-09-21 MED ORDER — ROSUVASTATIN CALCIUM 5 MG PO TABS
5.0000 mg | ORAL_TABLET | Freq: Every day | ORAL | Status: DC
  Administered 2022-09-22 – 2022-10-01 (×10): 5 mg via ORAL
  Filled 2022-09-21 (×10): qty 1

## 2022-09-21 MED ORDER — OXYCODONE HCL 5 MG PO TABS
5.0000 mg | ORAL_TABLET | ORAL | Status: DC | PRN
  Administered 2022-09-23 – 2022-09-24 (×2): 5 mg via ORAL
  Filled 2022-09-21 (×3): qty 1

## 2022-09-21 MED ORDER — PANTOPRAZOLE SODIUM 40 MG PO TBEC
40.0000 mg | DELAYED_RELEASE_TABLET | Freq: Every day | ORAL | Status: DC
  Administered 2022-09-21 – 2022-10-01 (×11): 40 mg via ORAL
  Filled 2022-09-21 (×11): qty 1

## 2022-09-21 MED ORDER — DOCUSATE SODIUM 100 MG PO CAPS
100.0000 mg | ORAL_CAPSULE | Freq: Two times a day (BID) | ORAL | Status: DC
  Administered 2022-09-21 – 2022-09-24 (×6): 100 mg via ORAL
  Filled 2022-09-21 (×6): qty 1

## 2022-09-21 MED ORDER — ALBUTEROL SULFATE (2.5 MG/3ML) 0.083% IN NEBU: 2.5000 mg | INHALATION_SOLUTION | RESPIRATORY_TRACT | Status: DC | PRN

## 2022-09-21 MED ORDER — ONDANSETRON HCL 4 MG/2ML IJ SOLN: 4.0000 mg | Freq: Four times a day (QID) | INTRAMUSCULAR | Status: DC | PRN

## 2022-09-21 MED ORDER — PREGABALIN 100 MG PO CAPS
100.0000 mg | ORAL_CAPSULE | Freq: Three times a day (TID) | ORAL | Status: DC
  Administered 2022-09-21 – 2022-10-01 (×29): 100 mg via ORAL
  Filled 2022-09-21 (×29): qty 1

## 2022-09-21 MED ORDER — ALBUTEROL SULFATE (2.5 MG/3ML) 0.083% IN NEBU: 2.5000 mg | INHALATION_SOLUTION | Freq: Three times a day (TID) | RESPIRATORY_TRACT | Status: DC

## 2022-09-21 MED ORDER — HYDROMORPHONE HCL 1 MG/ML IJ SOLN
0.5000 mg | INTRAMUSCULAR | Status: DC | PRN
  Administered 2022-09-22 – 2022-09-30 (×10): 0.5 mg via INTRAVENOUS
  Filled 2022-09-21 (×11): qty 0.5

## 2022-09-21 MED ORDER — AMLODIPINE BESYLATE 5 MG PO TABS
5.0000 mg | ORAL_TABLET | Freq: Every day | ORAL | Status: DC
  Administered 2022-09-21 – 2022-10-01 (×9): 5 mg via ORAL
  Filled 2022-09-21 (×10): qty 1

## 2022-09-21 MED ORDER — VANCOMYCIN HCL 1750 MG/350ML IV SOLN
1750.0000 mg | INTRAVENOUS | Status: DC
  Administered 2022-09-22 – 2022-09-24 (×3): 1750 mg via INTRAVENOUS
  Filled 2022-09-21 (×4): qty 350

## 2022-09-21 MED ORDER — ALBUTEROL SULFATE (2.5 MG/3ML) 0.083% IN NEBU: 3.0000 mL | INHALATION_SOLUTION | Freq: Four times a day (QID) | RESPIRATORY_TRACT | Status: DC | PRN

## 2022-09-21 MED ORDER — VANCOMYCIN HCL 10 G IV SOLR
2500.0000 mg | Freq: Once | INTRAVENOUS | Status: AC
  Administered 2022-09-21: 2500 mg via INTRAVENOUS
  Filled 2022-09-21: qty 2500

## 2022-09-21 MED ORDER — INSULIN ASPART 100 UNIT/ML IJ SOLN
0.0000 [IU] | Freq: Three times a day (TID) | INTRAMUSCULAR | Status: DC
  Administered 2022-09-22 – 2022-09-23 (×2): 4 [IU] via SUBCUTANEOUS
  Administered 2022-09-23: 3 [IU] via SUBCUTANEOUS
  Administered 2022-09-24 – 2022-09-25 (×3): 4 [IU] via SUBCUTANEOUS
  Administered 2022-09-26: 3 [IU] via SUBCUTANEOUS
  Administered 2022-09-26: 4 [IU] via SUBCUTANEOUS
  Administered 2022-09-27: 3 [IU] via SUBCUTANEOUS
  Administered 2022-09-27 – 2022-09-28 (×3): 4 [IU] via SUBCUTANEOUS
  Administered 2022-09-28 – 2022-09-29 (×3): 3 [IU] via SUBCUTANEOUS
  Administered 2022-09-29 – 2022-09-30 (×2): 4 [IU] via SUBCUTANEOUS
  Administered 2022-09-30 – 2022-10-01 (×2): 3 [IU] via SUBCUTANEOUS
  Administered 2022-10-01: 4 [IU] via SUBCUTANEOUS

## 2022-09-21 MED ORDER — ALBUTEROL SULFATE (2.5 MG/3ML) 0.083% IN NEBU
2.5000 mg | INHALATION_SOLUTION | Freq: Three times a day (TID) | RESPIRATORY_TRACT | Status: DC
  Filled 2022-09-21: qty 3

## 2022-09-21 MED ORDER — MULTIVITAMIN GUMMIES WOMENS PO CHEW: 2.0000 | CHEWABLE_TABLET | Freq: Every day | ORAL | Status: DC

## 2022-09-21 MED ORDER — LIRAGLUTIDE 18 MG/3ML ~~LOC~~ SOPN: 1.8000 mg | PEN_INJECTOR | Freq: Every day | SUBCUTANEOUS | Status: DC

## 2022-09-21 MED ORDER — BISACODYL 10 MG RE SUPP: 10.0000 mg | Freq: Every day | RECTAL | Status: DC | PRN

## 2022-09-21 MED ORDER — UMECLIDINIUM-VILANTEROL 62.5-25 MCG/ACT IN AEPB
1.0000 | INHALATION_SPRAY | Freq: Every day | RESPIRATORY_TRACT | Status: DC
  Administered 2022-09-25 – 2022-09-30 (×4): 1 via RESPIRATORY_TRACT
  Filled 2022-09-21 (×2): qty 14

## 2022-09-21 MED ORDER — OXYCODONE HCL 10 MG PO TABS: 10.0000 mg | ORAL_TABLET | ORAL | Status: DC | PRN

## 2022-09-21 MED ORDER — DICLOFENAC SODIUM 1 % EX GEL: 2.0000 g | Freq: Four times a day (QID) | CUTANEOUS | Status: DC | PRN

## 2022-09-21 MED ORDER — HYDRALAZINE HCL 10 MG PO TABS
10.0000 mg | ORAL_TABLET | Freq: Four times a day (QID) | ORAL | Status: DC
  Administered 2022-09-21 – 2022-10-01 (×34): 10 mg via ORAL
  Filled 2022-09-21 (×36): qty 1

## 2022-09-21 MED ORDER — HYDROMORPHONE HCL 1 MG/ML IJ SOLN
0.5000 mg | INTRAMUSCULAR | Status: DC | PRN
  Administered 2022-09-21: 1 mg via INTRAVENOUS
  Filled 2022-09-21: qty 1

## 2022-09-21 MED ORDER — FLEET ENEMA 7-19 GM/118ML RE ENEM: 1.0000 | ENEMA | Freq: Once | RECTAL | Status: DC | PRN

## 2022-09-21 MED ORDER — TIZANIDINE HCL 4 MG PO TABS
2.0000 mg | ORAL_TABLET | Freq: Three times a day (TID) | ORAL | Status: DC | PRN
  Administered 2022-09-24 – 2022-09-26 (×2): 2 mg via ORAL
  Filled 2022-09-21 (×3): qty 1

## 2022-09-21 MED ORDER — ESCITALOPRAM OXALATE 10 MG PO TABS
10.0000 mg | ORAL_TABLET | Freq: Every day | ORAL | Status: DC
  Administered 2022-09-22 – 2022-10-01 (×10): 10 mg via ORAL
  Filled 2022-09-21 (×10): qty 1

## 2022-09-21 MED ORDER — FENOFIBRATE 160 MG PO TABS: 160.0000 mg | ORAL_TABLET | Freq: Every day | ORAL | Status: DC

## 2022-09-21 MED ORDER — ONDANSETRON HCL 4 MG PO TABS: 4.0000 mg | ORAL_TABLET | Freq: Four times a day (QID) | ORAL | Status: DC | PRN

## 2022-09-21 MED ORDER — METOPROLOL TARTRATE 5 MG/5ML IV SOLN: 5.0000 mg | Freq: Four times a day (QID) | INTRAVENOUS | Status: DC | PRN

## 2022-09-21 MED ORDER — FLUTICASONE PROPIONATE 50 MCG/ACT NA SUSP
2.0000 | Freq: Every day | NASAL | Status: DC
  Administered 2022-09-22 – 2022-10-01 (×9): 2 via NASAL
  Filled 2022-09-21: qty 16

## 2022-09-21 MED ORDER — MECLIZINE HCL 25 MG PO TABS: 25.0000 mg | ORAL_TABLET | Freq: Three times a day (TID) | ORAL | Status: DC | PRN

## 2022-09-21 MED ORDER — ARFORMOTEROL TARTRATE 15 MCG/2ML IN NEBU: 15.0000 ug | INHALATION_SOLUTION | Freq: Two times a day (BID) | RESPIRATORY_TRACT | Status: DC

## 2022-09-21 MED ORDER — POLYETHYLENE GLYCOL 3350 17 G PO PACK: 17.0000 g | PACK | Freq: Every day | ORAL | Status: DC | PRN

## 2022-09-21 MED ORDER — ROPINIROLE HCL 1 MG PO TABS
1.0000 mg | ORAL_TABLET | Freq: Every day | ORAL | Status: DC
  Administered 2022-09-21 – 2022-09-30 (×10): 1 mg via ORAL
  Filled 2022-09-21 (×13): qty 1

## 2022-09-21 MED ORDER — UMECLIDINIUM BROMIDE 62.5 MCG/ACT IN AEPB: 1.0000 | INHALATION_SPRAY | Freq: Every day | RESPIRATORY_TRACT | Status: DC

## 2022-09-21 MED ORDER — LINACLOTIDE 145 MCG PO CAPS
145.0000 ug | ORAL_CAPSULE | Freq: Every day | ORAL | Status: DC
  Filled 2022-09-21 (×10): qty 1

## 2022-09-21 MED ORDER — SUVOREXANT 10 MG PO TABS: 10.0000 mg | ORAL_TABLET | Freq: Every evening | ORAL | Status: DC | PRN

## 2022-09-21 MED ORDER — VITAMIN D (ERGOCALCIFEROL) 1.25 MG (50000 UNIT) PO CAPS: 50000.0000 [IU] | ORAL_CAPSULE | ORAL | Status: DC

## 2022-09-21 MED ORDER — METOPROLOL TARTRATE 50 MG PO TABS
50.0000 mg | ORAL_TABLET | Freq: Two times a day (BID) | ORAL | Status: DC
  Administered 2022-09-21 – 2022-10-01 (×17): 50 mg via ORAL
  Filled 2022-09-21: qty 2
  Filled 2022-09-21 (×2): qty 1
  Filled 2022-09-21: qty 2
  Filled 2022-09-21 (×5): qty 1
  Filled 2022-09-21: qty 2
  Filled 2022-09-21: qty 1
  Filled 2022-09-21: qty 2
  Filled 2022-09-21 (×4): qty 1
  Filled 2022-09-21: qty 2
  Filled 2022-09-21 (×2): qty 1
  Filled 2022-09-21: qty 2

## 2022-09-21 NOTE — ED Provider Notes (Signed)
[place] Provider Note   CSN: [id number] Arrival date & time: 09/21/22  1202     History  Chief Complaint  Patient presents with   Post-op Problem    Nancy Simmons is a 67 y.o. female.  HPI Dr. Arnoldo Morale neurosurgery performed L5-S1 decompression, instrumentation and fusion on the patient on 09/10/2022.  She was discharged from the hospital 2\23\2024.  Patient what she was doing pretty well postoperatively.  She was participating in rehab and not having significant mount of pain.  About a week ago she started getting bleeding from her wound.  She reports it was coming and going and sometimes it was bleeding quite a bit.  She reports that the nursing staff at rehab had counseled her for increased rest and decreased activity over the past week to try to decrease injury to the wound.  She was sent to the emergency department today with report of some copious bleeding from the wound.  Patient reports that she is not really aware of it except for if the dressing is wet.  To her knowledge she has not had fevers or chills.  She has been more sedentary per instructions for wound healing from nursing staff but otherwise does not have cough, shortness of breath, vomiting diarrhea or abdominal pain.    Home Medications      Allergies    Acetaminophen and Penicillin g    Review of Systems   Review of Systems  Physical Exam Updated Vital Signs BP 124/70 (BP Location: Right Arm)   Pulse 83   Temp 98.6 F (37 C) (Oral)   Resp (!) 23   Ht '5\' 2"'$  (1.575 m)   Wt 136 kg   SpO2 95%   BMI 54.84 kg/m  Physical Exam Constitutional:      Comments: Alert.  No acute distress.  Mental status clear.  No respiratory distress.  HENT:     Mouth/Throat:     Pharynx: Oropharynx is clear.  Eyes:     Extraocular Movements: Extraocular movements intact.  Cardiovascular:     Rate and Rhythm: Normal rate and regular rhythm.  Pulmonary:     Effort:  Pulmonary effort is normal.     Breath sounds: Normal breath sounds.  Abdominal:     General: There is no distension.     Palpations: Abdomen is soft.     Tenderness: There is no abdominal tenderness. There is no guarding.  Musculoskeletal:        General: No swelling or tenderness.     Right lower leg: No edema.     Left lower leg: No edema.     Comments: Low back midline incisional wound has developing scar tissue.  The inferior aspect of the wound has serosanguineous drainage.  With palpation no active or aggressive bleeding.  Material expressed is pink-tinged straw yellow.  No apparent superficial abscess or large-scale dehiscence.  Skin:    General: Skin is warm and dry.  Neurological:     General: No focal deficit present.     Mental Status: She is oriented to person, place, and time.     Comments: Patient has mobility compromise due to body habitus.  She is however able to position herself from supine on her back to lateral position on the left for examination.  She is using both lower extremities to do so.  Psychiatric:        Mood and Affect: Mood normal.  ED Results / Procedures / Treatments   Labs (all labs ordered are listed, but only abnormal results are displayed) Labs Reviewed  CULTURE, BLOOD (ROUTINE X 2)  CULTURE, BLOOD (ROUTINE X 2)  BASIC METABOLIC PANEL  CBC WITH DIFFERENTIAL/PLATELET  SEDIMENTATION RATE  C-REACTIVE PROTEIN  URINALYSIS, COMPLETE (UACMP) WITH MICROSCOPIC  HIV ANTIBODY (ROUTINE TESTING W REFLEX)    EKG None  Radiology No results found.  Procedures Procedures    Medications Ordered in ED Medications  Oxycodone HCl TABS 10 mg (has no administration in time range)  amLODipine (NORVASC) tablet 5 mg (has no administration in time range)  fenofibrate tablet 160 mg (has no administration in time range)  hydrALAZINE (APRESOLINE) tablet 10 mg (has no administration in time range)  metoprolol tartrate (LOPRESSOR) tablet 50 mg (has no  administration in time range)  rosuvastatin (CRESTOR) tablet 5 mg (has no administration in time range)  escitalopram (LEXAPRO) tablet 10 mg (has no administration in time range)  Suvorexant TABS 10 mg (has no administration in time range)  liraglutide (VICTOZA) SOPN 1.8 mg (has no administration in time range)  linaclotide (LINZESS) capsule 145 mcg (has no administration in time range)  meclizine (ANTIVERT) tablet 25 mg (has no administration in time range)  pantoprazole (PROTONIX) EC tablet 40 mg (has no administration in time range)  pregabalin (LYRICA) capsule 100-200 mg (has no administration in time range)  rOPINIRole (REQUIP) tablet 1 mg (has no administration in time range)  tiZANidine (ZANAFLEX) tablet 2 mg (has no administration in time range)  Multivitamin Gummies Womens CHEW 2 tablet (has no administration in time range)  Vitamin D (Ergocalciferol) (DRISDOL) 1.25 MG (50000 UNIT) capsule 50,000 Units (has no administration in time range)  albuterol (PROVENTIL) (2.5 MG/3ML) 0.083% nebulizer solution 2.5 mg (has no administration in time range)  albuterol (VENTOLIN HFA) 108 (90 Base) MCG/ACT inhaler 1-2 puff (has no administration in time range)  fluticasone (FLONASE) 50 MCG/ACT nasal spray 2 spray (has no administration in time range)  arformoterol (BROVANA) nebulizer solution 15 mcg (has no administration in time range)    And  umeclidinium bromide (INCRUSE ELLIPTA) 62.5 MCG/ACT 1 puff (has no administration in time range)  umeclidinium-vilanterol (ANORO ELLIPTA) 62.5-25 MCG/ACT 1 puff (has no administration in time range)  diclofenac sodium (VOLTAREN) 1 % transdermal gel 2 g (has no administration in time range)  insulin aspart (novoLOG) injection 0-20 Units (has no administration in time range)  HYDROmorphone (DILAUDID) injection 0.5-1 mg (has no administration in time range)  docusate sodium (COLACE) capsule 100 mg (has no administration in time range)  polyethylene glycol  (MIRALAX / GLYCOLAX) packet 17 g (has no administration in time range)  bisacodyl (DULCOLAX) suppository 10 mg (has no administration in time range)  sodium phosphate (FLEET) 7-19 GM/118ML enema 1 enema (has no administration in time range)  ondansetron (ZOFRAN) tablet 4 mg (has no administration in time range)    Or  ondansetron (ZOFRAN) injection 4 mg (has no administration in time range)  metoprolol tartrate (LOPRESSOR) injection 5 mg (has no administration in time range)  vancomycin (VANCOCIN) 2,500 mg in sodium chloride 0.9 % 500 mL IVPB (2,500 mg Intravenous New Bag/Given 09/21/22 1348)  vancomycin (VANCOREADY) IVPB 1750 mg/350 mL (has no administration in time range)    ED Course/ Medical Decision Making/ A&P                             Medical Decision Making Amount  and/or Complexity of Data Reviewed Labs: ordered.  Risk Decision regarding hospitalization.   Patient presents as outlined from rehab facility with serosanguineous drainage from postoperative spinal fusion wound.  Patient is not febrile in the emergency department, she does not have hypotension or tachycardia.  At this time not meeting sepsis criteria.  Wound drainage more consistent with seroma or infection.  This does not appear to be any fresh active bleeding at the time of my exam.  Will obtain basic lab work and consult neurosurgery.  Neurosurgery has written admission orders for the patient and initiated vancomycin for postoperative wound.        Final Clinical Impression(s) / ED Diagnoses Final diagnoses:  Infection of deep incisional surgical site after procedure, initial encounter    Rx / DC Orders ED Discharge Orders     None         Charlesetta Shanks, MD 09/21/22 1437

## 2022-09-21 NOTE — H&P (Cosign Needed Addendum)
Nancy Simmons is an 67 y.o. female.   Chief Complaint: Wound drainage HPI: Ms. Darocha is a 67 year old female with a past medical history of asthma, autoimmune hepatitis, COPD, CAD (on Eliquis), GERD, high cholesterol, hypertension, sleep apnea, and type II diabetes. She underwent an L5-S1 PLIF by Dr. Arnoldo Morale on 09/10/2022. She was discharged to [facility] for continued rehabilitation at discharge. The nursing facility contacted the office on 09/18/2022 to report bleeding from her surgical wound with movement. An attempt was made to get the patient into the office on 09/20/2022, but the nursing facility reported this was not enough notice for transportation. Ms. Nalley post op appointment was then moved up to 09/27/2022. A prescription was sent in for Ms. Karney to start doxycycline 100 mg BID on 09/18/2022. Upon reviewing the patient's paperwork from the facility, it does not appear the antibiotic was ever started. [place] reported to EMS that the patient's surgical wound was bleeding through the dressing. Patient without fever or purulent drainage. She has the expected postoperative pain at her back. She denies numbness, tingling, or weakness. She denies bowel or bladder dysfunction. She reports that the facility stopped mobilizing her two days ago when the drainage became severe. She rates her pain a 5/10 presently.  Past Medical History:  Diagnosis Date   Arthritis    "pretty much all over" (04/03/2017)   Asthma    Autoimmune hepatitis ([place])    COPD (chronic obstructive pulmonary disease) (HCC)    Coronary artery disease    a. mild-mod by 03/2017 following false positive nuc - She subsequently underwent cath with 30% LM, 30% ostial LAD, 45% mLAD, 50% dLAD, 30% prox RCA, no flow limiting lesions, LVEF 55-65%.    Family history of adverse reaction to anesthesia    "granddaughter has PONV"   GERD (gastroesophageal reflux disease)    High cholesterol    History of  blood transfusion    "in [place]; related to knee surgeries"   History of kidney stones    Hypertension    OSA on CPAP    CPAP, pressure settings 11   Type II diabetes mellitus ([place])     Past Surgical History:  Procedure Laterality Date   ABDOMINAL HYSTERECTOMY     BACK SURGERY     CARDIAC CATHETERIZATION     CARPAL TUNNEL RELEASE Bilateral    "done in [place]"   [place] Right 2015   S/P knee revision   INTRAVASCULAR PRESSURE WIRE/FFR STUDY N/A 04/05/2017   Procedure: INTRAVASCULAR PRESSURE WIRE/FFR STUDY;  Surgeon: Leonie Man, MD;  Location: [facility];  Service: Cardiovascular;  Laterality: N/A;   JOINT REPLACEMENT     LEFT HEART CATH AND CORONARY ANGIOGRAPHY N/A 04/05/2017   Procedure: LEFT HEART CATH AND CORONARY ANGIOGRAPHY;  Surgeon: Leonie Man, MD;  Location: [facility];  Service: Cardiovascular;  Laterality: N/A;   LUMBAR LAMINECTOMY/DECOMPRESSION MICRODISCECTOMY Left 05/22/2018   Procedure: MICRODISCECTOMY LUMBAR TWOLUMBAR THREE;  Surgeon: Newman Pies, MD;  Location: [facility];  Service: Neurosurgery;  Laterality: Left;   LUMBAR LAMINECTOMY/DECOMPRESSION MICRODISCECTOMY Left 09/03/2019   Procedure: LEFT LUMBAR TWO- LUMBAR THREE MICRODISCECTOMY;  Surgeon: Newman Pies, MD;  Location: [facility];  Service: Neurosurgery;  Laterality: Left;  MICRODISCECTOMY LEFT LUMBAR 2- LUMBAR 3   POSTERIOR FUSION LUMBAR SPINE  05/2010   REVISION TOTAL KNEE ARTHROPLASTY Right 2015   SHOULDER ARTHROSCOPY WITH ROTATOR CUFF REPAIR Right  SHOULDER OPEN ROTATOR CUFF REPAIR Left    "done in [place]"   TOTAL HIP ARTHROPLASTY Left 12/10/2017   Procedure: LEFT TOTAL HIP ARTHROPLASTY ANTERIOR APPROACH;  Surgeon: Mcarthur Rossetti, MD;  Location: [facility];  Service: Orthopedics;  Laterality: Left;   TOTAL KNEE ARTHROPLASTY Bilateral 1990s    Family History  Problem Relation Age of Onset   CAD Mother    Heart disease Mother         Enlarged heart   CAD Father    Diabetes Father    Heart failure Father    CAD Brother    Diabetes Brother    Heart disease Brother        Defibrillator   Colon cancer Other    Stroke Other    Social History:  reports that she quit smoking about 4 years ago. Her smoking use included cigarettes. She started smoking about 50 years ago. She has a 21.50 pack-year smoking history. She has never used smokeless tobacco. She reports that she does not currently use drugs after having used the following drugs: "Crack" cocaine. She reports that she does not drink alcohol.  Allergies:  Allergies  Allergen Reactions   Acetaminophen Other (See Comments)    PATIENT HAS AUTOIMMUNE HEPATITIS PATIENT IS * NOT * TO RECEIVE ANY ACETAMINOPHEN   Penicillin G Rash and Other (See Comments)    Has patient had a PCN reaction causing immediate rash, facial/tongue/throat swelling, SOB or lightheadedness with hypotension: No Has patient had a PCN reaction causing severe rash involving mucus membranes or skin necrosis: No Has patient had a PCN reaction that required hospitalization: No Has patient had a PCN reaction occurring within the last 10 years: #  #  #  YES  #  #  #     (Not in a hospital admission)   No results found for this or any previous visit (from the past 62 hour(s)). No results found.  Review of Systems  Constitutional:  Positive for activity change. Negative for appetite change, chills and fever.  HENT: Negative.    Eyes: Negative.   Respiratory:  Negative for cough, chest tightness, shortness of breath and wheezing.   Cardiovascular: Negative.   Gastrointestinal:  Negative for abdominal pain, diarrhea, nausea and vomiting.  Endocrine: Negative.   Genitourinary: Negative.   Musculoskeletal:  Positive for arthralgias and back pain. Negative for gait problem, joint swelling and myalgias.  Skin: Negative.   Allergic/Immunologic: Negative.   Neurological:  Negative for weakness.   Hematological: Negative.   Psychiatric/Behavioral: Negative.      Blood pressure 124/70, pulse 83, temperature 98.6 F (37 C), temperature source Oral, resp. rate (!) 23, height '5\' 2"'$  (1.575 m), weight 136 kg, SpO2 95 %. Physical Exam Constitutional:      Appearance: She is obese. She is not ill-appearing.  HENT:     Head: Normocephalic and atraumatic.     Nose: Nose normal.     Mouth/Throat:     Mouth: Mucous membranes are moist.     Pharynx: Oropharynx is clear.  Eyes:     Extraocular Movements: Extraocular movements intact.     Pupils: Pupils are equal, round, and reactive to light.  Cardiovascular:     Rate and Rhythm: Normal rate and regular rhythm.  Pulmonary:     Effort: Pulmonary effort is normal. No respiratory distress.  Abdominal:     Palpations: Abdomen is soft.     Tenderness: There is no abdominal tenderness.  Musculoskeletal:  Cervical back: Normal range of motion and neck supple.     Lumbar back: Tenderness present.     Comments: Strength and sensation are intact.  Skin:    General: Skin is warm and dry.     Capillary Refill: Capillary refill takes less than 2 seconds.       Neurological:     General: No focal deficit present.     Mental Status: She is alert and oriented to person, place, and time.  Psychiatric:        Mood and Affect: Mood normal.        Behavior: Behavior normal.        Thought Content: Thought content normal.        Judgment: Judgment normal.      Assessment/Plan Patient with postoperative wound drainage. She will be admitted to the hospital in anticipation of an I and D of her surgical wound on Monday, 09/24/2022. Her Eliquis should be held. Vancomycin and ceftriaxone will be started today. She is fine to mobilize.    Patricia Nettle, NP 09/21/2022, 1:43 PM

## 2022-09-21 NOTE — Progress Notes (Signed)
   09/21/22 2051  Respiratory Severity Assessment  $ Protocol Assessment  Yes  Heart Rate 0  Breath Sounds 0  Respiratory Pattern 0  Cough 0  Chest X Ray 1  O2 Requirements/ Pulse Ox Sat(%) 0  Mental Status 0  Dyspnea 0  Score Total 1  Aerosolized Bronchodilators  Aerosolized bronchodilator indications Asthma/Reactive Airway Disease/PEFR<80%  Medication Plan of Care DPI;MDI with spacer;Hand held neb treatment  Respiratory Therapy Follow Up Assessment  Assessment follow up date 09/22/22   Assessment completed. Pt takes Albuterol PRN at home. Chart says TID but patient says she only takes as needed. Pt also takes Anoro and Bevespi. Will change order for albuterol to PRN. Pt has anoro and Brovana ordered. Pt is on RA. Pt also wears CPAP at night and will be provided one while she is admitted.

## 2022-09-21 NOTE — ED Notes (Signed)
ED TO INPATIENT HANDOFF REPORT  ED Nurse Name and Phone #: [phone]  S Name/Age/Gender Evalina Field 67 y.o. female Room/Bed: 005C/005C  Code Status   Code Status: Full Code  Home/SNF/Other Skilled nursing facility Patient oriented to: self, place, time, and situation Is this baseline? Yes   Triage Complete: Triage complete  Chief Complaint Postoperative complication of skin involving drainage from surgical wound [L76.82]  Triage Note [facility] [place]; called out for hemorrhage at surgical site; recent spinal effusion (2/19); staff states today pt bleeding through dressing; VSS; dressing changed 2-3 times today; bleeding controlled at this time; no LOC, ambulatory with assistance; 176/91, HR 80, RR 18, 95% RA, cbg 148; pt A and O on arrival to ED; pain when lying on site; pt has no other complaints at this time   Allergies Allergies  Allergen Reactions   Acetaminophen Other (See Comments)    PATIENT HAS AUTOIMMUNE HEPATITIS PATIENT IS * NOT * TO RECEIVE ANY ACETAMINOPHEN   Penicillin G Rash and Other (See Comments)    Has patient had a PCN reaction causing immediate rash, facial/tongue/throat swelling, SOB or lightheadedness with hypotension: No Has patient had a PCN reaction causing severe rash involving mucus membranes or skin necrosis: No Has patient had a PCN reaction that required hospitalization: No Has patient had a PCN reaction occurring within the last 10 years: #  #  #  YES  #  #  #     Level of Care/Admitting Diagnosis ED Disposition     ED Disposition  Admit   Condition  --   [place]: [facility] [[id number]]  Level of Care: Med-Surg [16]  May admit patient to [facility] or [facility] if equivalent level of care is available:: No  Covid Evaluation: Asymptomatic - no recent exposure (last 10 days) testing not required  Diagnosis: Postoperative complication of skin involving drainage from surgical  wound [id number]  Admitting Physician: Arnoldo Morale, [place]  Attending Physician: Arnoldo Morale, JEFFREY [[id number]]  Bed request comments: 4NP or 3W  Certification:: I certify this patient will need inpatient services for at least 2 midnights  Estimated Length of Stay: 4          B Medical/Surgery History Past Medical History:  Diagnosis Date   Arthritis    "pretty much all over" (04/03/2017)   Asthma    Autoimmune hepatitis ([place])    COPD (chronic obstructive pulmonary disease) ([place])    Coronary artery disease    a. mild-mod by 03/2017 following false positive nuc - She subsequently underwent cath with 30% LM, 30% ostial LAD, 45% mLAD, 50% dLAD, 30% prox RCA, no flow limiting lesions, LVEF 55-65%.    Family history of adverse reaction to anesthesia    "granddaughter has PONV"   GERD (gastroesophageal reflux disease)    High cholesterol    History of blood transfusion    "in [place]; related to knee surgeries"   History of kidney stones    Hypertension    OSA on CPAP    CPAP, pressure settings 11   Type II diabetes mellitus ([place])    Past Surgical History:  Procedure Laterality Date   ABDOMINAL HYSTERECTOMY     BACK SURGERY     CARDIAC CATHETERIZATION     CARPAL TUNNEL RELEASE Bilateral    "done in [place]"   [place] Right 2015   S/P knee revision   INTRAVASCULAR PRESSURE  WIRE/FFR STUDY N/A 04/05/2017   Procedure: INTRAVASCULAR PRESSURE WIRE/FFR STUDY;  Surgeon: Leonie Man, MD;  Location: [facility];  Service: Cardiovascular;  Laterality: N/A;   JOINT REPLACEMENT     LEFT HEART CATH AND CORONARY ANGIOGRAPHY N/A 04/05/2017   Procedure: LEFT HEART CATH AND CORONARY ANGIOGRAPHY;  Surgeon: Leonie Man, MD;  Location: [facility];  Service: Cardiovascular;  Laterality: N/A;   LUMBAR LAMINECTOMY/DECOMPRESSION MICRODISCECTOMY Left 05/22/2018   Procedure: MICRODISCECTOMY LUMBAR TWOLUMBAR THREE;  Surgeon: Newman Pies, MD;   Location: [facility];  Service: Neurosurgery;  Laterality: Left;   LUMBAR LAMINECTOMY/DECOMPRESSION MICRODISCECTOMY Left 09/03/2019   Procedure: LEFT LUMBAR TWO- LUMBAR THREE MICRODISCECTOMY;  Surgeon: Newman Pies, MD;  Location: [facility];  Service: Neurosurgery;  Laterality: Left;  MICRODISCECTOMY LEFT LUMBAR 2- LUMBAR 3   POSTERIOR FUSION LUMBAR SPINE  05/2010   REVISION TOTAL KNEE ARTHROPLASTY Right 2015   SHOULDER ARTHROSCOPY WITH ROTATOR CUFF REPAIR Right    SHOULDER OPEN ROTATOR CUFF REPAIR Left    "done in [place]"   TOTAL HIP ARTHROPLASTY Left 12/10/2017   Procedure: LEFT TOTAL HIP ARTHROPLASTY ANTERIOR APPROACH;  Surgeon: Mcarthur Rossetti, MD;  Location: [facility];  Service: Orthopedics;  Laterality: Left;   TOTAL KNEE ARTHROPLASTY Bilateral 1990s     A IV Location/Drains/Wounds Patient Lines/Drains/Airways Status     Active Line/Drains/Airways     Name Placement date Placement time Site Days   Peripheral IV 09/10/22 18 G Anterior;Right Foot 09/10/22  1113  Foot  11   Peripheral IV 09/10/22 20 G Left;Posterior Hand 09/10/22  1245  Hand  11   Peripheral IV 09/21/22 Right Antecubital 09/21/22  1224  Antecubital  less than 1   External Urinary Catheter 09/13/22  2345  --  8            Intake/Output Last 24 hours No intake or output data in the 24 hours ending 09/21/22 1350  Labs/Imaging No results found for this or any previous visit (from the past 48 hour(s)). No results found.  Pending Labs Unresulted Labs (From admission, onward)     Start     Ordered   09/21/22 [id number]  Basic metabolic panel  Once,   STAT        09/21/22 1316   09/21/22 1317  CBC with Differential  Once,   STAT        09/21/22 1316   Signed and Held  HIV Antibody (routine testing w rflx)  (HIV Antibody (Routine testing w reflex) panel)  Once,   R        Signed and Held   Signed and Held  Basic metabolic panel  Once,   R        Signed and Held   Signed and Held  CBC  Once,   R        Signed and  Held   Signed and Held  Urinalysis, Complete w Microscopic -Urine, Clean Catch  Once,   R       Question:  Specimen Source  Answer:  Urine, Clean Catch   Signed and Held            Vitals/Pain Today's Vitals   09/21/22 1212 09/21/22 1215 09/21/22 1225 09/21/22 1228  BP:  124/70    Pulse:  77  83  Resp: 16 16  (!) 23  Temp:  98.5 F (36.9 C) 98.6 F (37 C)   TempSrc:  Oral Oral   SpO2:  97%  95%  Weight:  136 kg    Height:  '5\' 2"'$  (1.575 m)    PainSc:        Isolation Precautions No active isolations  Medications Medications  vancomycin (VANCOCIN) 2,500 mg in sodium chloride 0.9 % 500 mL IVPB (2,500 mg Intravenous New Bag/Given 09/21/22 1348)    Mobility walks with person assist     Focused Assessments Cardiac Assessment Handoff:    Lab Results  Component Value Date   CKTOTAL 593 (H) 06/01/2010   CKMB 1.3 06/01/2010   TROPONINI <0.03 06/21/2018   No results found for: "DDIMER" Does the Patient currently have chest pain? No    R Recommendations: See Admitting Provider Note  Report given to:   Additional Notes:

## 2022-09-21 NOTE — ED Triage Notes (Signed)
[facility] in [place]; called out for hemorrhage at surgical site; recent spinal effusion (2/19); staff states today pt bleeding through dressing; VSS; dressing changed 2-3 times today; bleeding controlled at this time; no LOC, ambulatory with assistance; 176/91, HR 80, RR 18, 95% RA, cbg 148; pt A and O on arrival to ED; pain when lying on site; pt has no other complaints at this time

## 2022-09-21 NOTE — Addendum Note (Signed)
Addendum  created 09/21/22 1134 by Nilda Simmer, MD   Intraprocedure Staff edited

## 2022-09-21 NOTE — Progress Notes (Signed)
Pharmacy Antibiotic Note  Nancy Simmons is a 67 y.o. female admitted on 09/21/2022 with  wound infection .  Pharmacy has been consulted for vancomycin dosing.   SCr 0.69 and CrCl 91 ml/min. WBC 9.7.  Plan: Vancomycin 2500 mg IV x1, followed by vancomycin 1750 mg IV q24h (eAUC 523) Monitor signs/symptoms of infection  Height: '5\' 2"'$  (157.5 cm) Weight: 136 kg (299 lb 13.2 oz) IBW/kg (Calculated) : 50.1  Temp (24hrs), Avg:98.6 F (37 C), Min:98.5 F (36.9 C), Max:98.6 F (37 C)  Estimated Creatinine Clearance: 91 mL/min (by C-G formula based on SCr of 0.69 mg/dL).    Allergies  Allergen Reactions   Acetaminophen Other (See Comments)    PATIENT HAS AUTOIMMUNE HEPATITIS PATIENT IS * NOT * TO RECEIVE ANY ACETAMINOPHEN   Penicillin G Rash and Other (See Comments)    Has patient had a PCN reaction causing immediate rash, facial/tongue/throat swelling, SOB or lightheadedness with hypotension: No Has patient had a PCN reaction causing severe rash involving mucus membranes or skin necrosis: No Has patient had a PCN reaction that required hospitalization: No Has patient had a PCN reaction occurring within the last 10 years: #  #  #  YES  #  #  #     Antimicrobials this admission: Vancomycin 3/1 >>    Dose adjustments this admission:   Microbiology results:   Thank you for allowing pharmacy to be a part of this patient's care.  Jeneen Rinks [phone] [id number] PM

## 2022-09-22 LAB — GLUCOSE, CAPILLARY: Glucose-Capillary: 136 mg/dL — ABNORMAL HIGH (ref 70–99)

## 2022-09-22 NOTE — Progress Notes (Signed)
Pt stated she doesn't want to wear hospital CPAP tonight. Family member is supposed to be bringing pt her home CPAP machine.

## 2022-09-22 NOTE — Progress Notes (Signed)
  NEUROSURGERY PROGRESS NOTE   No issues overnight, no complaints this am.  EXAM:  BP (!) 145/50 (BP Location: Left Wrist)   Pulse 88   Temp 99.2 F (37.3 C) (Oral)   Resp 18   Ht '5\' 2"'$  (1.575 m)   Wt 136 kg   SpO2 93%   BMI 54.84 kg/m   Awake, alert, oriented  Speech fluent, appropriate  CN grossly intact  5/5 BUE/BLE  Lumbar wound dressing is clean  IMPRESSION:  67 y.o. female s/p PLIF 09/10/22 with continued wound drainage, otherwise non-toxic appearing  PLAN: - Cont supportive care including vancomycin - Plan on operative debridement on Mon with Dr. Arnoldo Morale.   Consuella Lose, MD [facility]

## 2022-09-23 LAB — BASIC METABOLIC PANEL
Anion gap: 6 (ref 5–15)
BUN: 10 mg/dL (ref 8–23)
CO2: 22 mmol/L (ref 22–32)
Calcium: 8.8 mg/dL — ABNORMAL LOW (ref 8.9–10.3)
Chloride: 110 mmol/L (ref 98–111)
Creatinine, Ser: 0.66 mg/dL (ref 0.44–1.00)
GFR, Estimated: >60 mL/min (ref >60)
Glucose, Bld: 109 mg/dL — ABNORMAL HIGH (ref 70–99)
Potassium: 3.6 mmol/L (ref 3.5–5.1)
Sodium: 138 mmol/L (ref 135–145)

## 2022-09-23 LAB — CBC
HCT: 30.4 % — ABNORMAL LOW (ref 36.0–46.0)
Hemoglobin: 9.7 g/dL — ABNORMAL LOW (ref 12.0–15.0)
MCH: 30.3 pg (ref 26.0–34.0)
MCHC: 31.9 g/dL (ref 30.0–36.0)
MCV: 95 fL (ref 80.0–100.0)
Platelets: 284 10*3/uL (ref 150–400)
RBC: 3.2 MIL/uL — ABNORMAL LOW (ref 3.87–5.11)
RDW: 14.2 % (ref 11.5–15.5)
WBC: 7.6 10*3/uL (ref 4.0–10.5)
nRBC: 0 % (ref 0.0–0.2)

## 2022-09-23 LAB — GLUCOSE, CAPILLARY: Glucose-Capillary: 135 mg/dL — ABNORMAL HIGH (ref 70–99)

## 2022-09-23 NOTE — Progress Notes (Signed)
  NEUROSURGERY PROGRESS NOTE   No issues overnight, no complaints this am.  EXAM:  BP (!) 123/59 (BP Location: Right Wrist)   Pulse 83   Temp 99.3 F (37.4 C) (Oral)   Resp 16   Ht '5\' 2"'$  (1.575 m)   Wt 136 kg   SpO2 93%   BMI 54.84 kg/m   Awake, alert, oriented  Speech fluent, appropriate  CN grossly intact  5/5 BUE/BLE  Lumbar wound dressing is clean  IMPRESSION:  67 y.o. female s/p PLIF 09/10/22 with continued wound drainage, otherwise non-toxic appearing  PLAN: - Cont supportive care including vancomycin - Plan on operative debridement tomorrow with Dr. Arnoldo Morale. - NPO p MN   Consuella Lose, MD [facility]

## 2022-09-23 NOTE — TOC Initial Note (Addendum)
Transition of Care [facility]) - Initial/Assessment Note    Patient Details  Name: Nancy Simmons MRN: [id number] Date of Birth: 07-02-1956  [facility]) CM/SW Contact:    Vinie Sill, LCSW Phone Number: 09/23/2022, 11:30 AM  Clinical Narrative:                  CSW met with patient. CSW introduced self and explained role. Patient confirmed she arrived form [facility] and agreeable to returning once stable. Patient states no questions or concerns at this time.   Will need PT/OT ordered & for insurance authorization once stable.   TOC will continue to follow and assist with discharge planning.  Thurmond Butts, MSW, LCSW Clinical Social Worker    Expected Discharge Plan: Skilled Nursing Facility Barriers to Discharge: Continued Medical Work up   Patient Goals and CMS Choice            Expected Discharge Plan and Services In-house Referral: Clinical Social Work                                            Prior Living Arrangements/Services   Lives with:: Self Patient language and need for interpreter reviewed:: No              Criminal Activity/Legal Involvement Pertinent to Current Situation/Hospitalization: No - Comment as needed  Activities of Daily Living Home Assistive Devices/Equipment: Environmental consultant (specify type) ADL Screening (condition at time of admission) Patient's cognitive ability adequate to safely complete daily activities?: Yes Is the patient deaf or have difficulty hearing?: No Does the patient have difficulty seeing, even when wearing glasses/contacts?: No Does the patient have difficulty concentrating, remembering, or making decisions?: No Patient able to express need for assistance with ADLs?: Yes Does the patient have difficulty dressing or bathing?: No Independently performs ADLs?: Yes (appropriate for developmental age) Does the patient have difficulty walking or climbing stairs?: Yes Weakness of Legs: None Weakness  of Arms/Hands: None  Permission Sought/Granted Permission sought to share information with : Facility Sport and exercise psychologist, Family Supports Permission granted to share information with : Yes, Verbal Permission Granted  Share Information with NAME: Pilar Grammes  Permission granted to share info w AGENCY: SNFs  Permission granted to share info w Relationship: daughter  Permission granted to share info w Contact Information: [phone]  Emotional Assessment Appearance:: Appears stated age Attitude/Demeanor/Rapport: Engaged Affect (typically observed): Appropriate, Pleasant Orientation: : Oriented to Self, Oriented to Place, Oriented to  Time, Oriented to Situation Alcohol / Substance Use: Not Applicable Psych Involvement: No (comment)  Admission diagnosis:  Infection of deep incisional surgical site after procedure, initial encounter [T81.42XA] Postoperative complication of skin involving drainage from surgical wound [L76.82] Patient Active Problem List   Diagnosis Date Noted   Postoperative complication of skin involving drainage from surgical wound 09/21/2022   Status post lumbar spinal fusion 09/10/2022   History of kidney stones 08/27/2022   History of blood transfusion 08/27/2022   GERD (gastroesophageal reflux disease) 08/27/2022   Family history of adverse reaction to anesthesia 08/27/2022   COPD (chronic obstructive pulmonary disease) ([place]) 08/27/2022   Arthritis 08/27/2022   Chest pain, unspecified 06/20/2018   CAD in native artery 06/20/2018   Morbid obesity ([place]) 06/20/2018   History of DVT (deep vein thrombosis) 06/20/2018   Chest pain 06/20/2018   Lumbar herniated disc 05/22/2018   Status post  left hip replacement 12/10/2017   Unilateral primary osteoarthritis, left hip 11/04/2017   Abnormal stress echocardiography    Stable angina 04/03/2017   DM2 (diabetes mellitus, type 2) ([place]) 04/03/2017   Asthma 04/03/2017   OSA on CPAP 04/03/2017   Angina at rest  04/03/2017   Medication monitoring encounter 07/09/2016   Autoimmune hepatitis ([place]) 07/09/2016   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/27/2015   HTN (hypertension) 09/27/2015   CAD (coronary artery disease) of artery bypass graft 09/27/2015   Hypercholesterolemia 09/27/2015   S/P cholecystectomy 09/27/2015   PCP:  Helen Hashimoto., MD Pharmacy:   [place], [place] - [address] [place] [address] Phone: [phone] Fax: [phone]     Social Determinants of Health (SDOH) Social History: SDOH Screenings   Food Insecurity: No Food Insecurity (09/21/2022)  Housing: Low Risk  (09/21/2022)  Transportation Needs: No Transportation Needs (09/21/2022)  Utilities: Not At Risk (09/21/2022)  Tobacco Use: Medium Risk (09/21/2022)   SDOH Interventions:     Readmission Risk Interventions     No data to display

## 2022-09-24 ENCOUNTER — Encounter (HOSPITAL_COMMUNITY)
Admission: EM | Disposition: A | Discharge status: Skilled Nursing Facility | Payer: Self-pay | Source: Home / Self Care | Attending: Neurosurgery

## 2022-09-24 ENCOUNTER — Inpatient Hospital Stay (HOSPITAL_COMMUNITY): Payer: 59 | Admitting: Certified Registered"

## 2022-09-24 ENCOUNTER — Encounter (HOSPITAL_COMMUNITY): Payer: Self-pay | Admitting: Neurosurgery

## 2022-09-24 DIAGNOSIS — J449 Chronic obstructive pulmonary disease, unspecified: Secondary | ICD-10-CM

## 2022-09-24 DIAGNOSIS — E119 Type 2 diabetes mellitus without complications: Secondary | ICD-10-CM

## 2022-09-24 DIAGNOSIS — I25119 Atherosclerotic heart disease of native coronary artery with unspecified angina pectoris: Secondary | ICD-10-CM

## 2022-09-24 DIAGNOSIS — T8140XA Infection following a procedure, unspecified, initial encounter: Secondary | ICD-10-CM

## 2022-09-24 DIAGNOSIS — I10 Essential (primary) hypertension: Secondary | ICD-10-CM

## 2022-09-24 DIAGNOSIS — Z87891 Personal history of nicotine dependence: Secondary | ICD-10-CM

## 2022-09-24 DIAGNOSIS — T847XXA Infection and inflammatory reaction due to other internal orthopedic prosthetic devices, implants and grafts, initial encounter: Secondary | ICD-10-CM | POA: Diagnosis present

## 2022-09-24 HISTORY — PX: LUMBAR WOUND DEBRIDEMENT: SHX1988

## 2022-09-24 LAB — GLUCOSE, CAPILLARY
Glucose-Capillary: 164 mg/dL — ABNORMAL HIGH (ref 70–99)
Glucose-Capillary: 112 mg/dL — ABNORMAL HIGH (ref 70–99)
Glucose-Capillary: 100 mg/dL — ABNORMAL HIGH (ref 70–99)
Glucose-Capillary: 92 mg/dL (ref 70–99)
Glucose-Capillary: 82 mg/dL (ref 70–99)
Glucose-Capillary: 117 mg/dL — ABNORMAL HIGH (ref 70–99)

## 2022-09-24 SURGERY — LUMBAR WOUND DEBRIDEMENT
Anesthesia: General | Site: Back

## 2022-09-24 MED ORDER — PROMETHAZINE HCL 25 MG/ML IJ SOLN: 6.2500 mg | INTRAMUSCULAR | Status: DC | PRN

## 2022-09-24 MED ORDER — PROPOFOL 10 MG/ML IV BOLUS
INTRAVENOUS | Status: AC
  Filled 2022-09-24: qty 20

## 2022-09-24 MED ORDER — MORPHINE SULFATE (PF) 4 MG/ML IV SOLN: 4.0000 mg | INTRAVENOUS | Status: DC | PRN

## 2022-09-24 MED ORDER — AMISULPRIDE (ANTIEMETIC) 5 MG/2ML IV SOLN: 10.0000 mg | Freq: Once | INTRAVENOUS | Status: DC | PRN

## 2022-09-24 MED ORDER — CHLORHEXIDINE GLUCONATE 0.12 % MT SOLN: 15.0000 mL | Freq: Once | OROMUCOSAL | Status: AC

## 2022-09-24 MED ORDER — SUGAMMADEX SODIUM 200 MG/2ML IV SOLN
INTRAVENOUS | Status: DC | PRN
  Administered 2022-09-24: 500 mg via INTRAVENOUS

## 2022-09-24 MED ORDER — ACETAMINOPHEN 325 MG PO TABS: 650.0000 mg | ORAL_TABLET | ORAL | Status: DC | PRN

## 2022-09-24 MED ORDER — ZOLPIDEM TARTRATE 5 MG PO TABS
5.0000 mg | ORAL_TABLET | Freq: Every evening | ORAL | Status: DC | PRN
  Administered 2022-09-29: 5 mg via ORAL
  Filled 2022-09-24: qty 1

## 2022-09-24 MED ORDER — VANCOMYCIN HCL 500 MG IV SOLR
INTRAVENOUS | Status: DC | PRN
  Administered 2022-09-24: 1000 mg

## 2022-09-24 MED ORDER — CYCLOBENZAPRINE HCL 10 MG PO TABS
10.0000 mg | ORAL_TABLET | Freq: Three times a day (TID) | ORAL | Status: DC | PRN
  Administered 2022-09-25 – 2022-09-30 (×7): 10 mg via ORAL
  Filled 2022-09-24 (×8): qty 1

## 2022-09-24 MED ORDER — MIDAZOLAM HCL 2 MG/2ML IJ SOLN
INTRAMUSCULAR | Status: DC | PRN
  Administered 2022-09-24: 2 mg via INTRAVENOUS

## 2022-09-24 MED ORDER — LIDOCAINE 2% (20 MG/ML) 5 ML SYRINGE
INTRAMUSCULAR | Status: DC | PRN
  Administered 2022-09-24: 100 mg via INTRAVENOUS

## 2022-09-24 MED ORDER — SUGAMMADEX SODIUM 500 MG/5ML IV SOLN
INTRAVENOUS | Status: AC
  Filled 2022-09-24: qty 5

## 2022-09-24 MED ORDER — OXYCODONE HCL 5 MG PO TABS
10.0000 mg | ORAL_TABLET | ORAL | Status: DC | PRN
  Administered 2022-09-25 – 2022-10-01 (×16): 10 mg via ORAL
  Filled 2022-09-24 (×18): qty 2

## 2022-09-24 MED ORDER — FENTANYL CITRATE (PF) 250 MCG/5ML IJ SOLN
INTRAMUSCULAR | Status: AC
  Filled 2022-09-24: qty 5

## 2022-09-24 MED ORDER — HYDROMORPHONE HCL 1 MG/ML IJ SOLN
INTRAMUSCULAR | Status: AC
  Filled 2022-09-24: qty 1

## 2022-09-24 MED ORDER — ONDANSETRON HCL 4 MG/2ML IJ SOLN
INTRAMUSCULAR | Status: DC | PRN
  Administered 2022-09-24: 4 mg via INTRAVENOUS

## 2022-09-24 MED ORDER — PHENOL 1.4 % MT LIQD: 1.0000 | OROMUCOSAL | Status: DC | PRN

## 2022-09-24 MED ORDER — ROCURONIUM BROMIDE 10 MG/ML (PF) SYRINGE
PREFILLED_SYRINGE | INTRAVENOUS | Status: DC | PRN
  Administered 2022-09-24: 80 mg via INTRAVENOUS

## 2022-09-24 MED ORDER — MIDAZOLAM HCL 2 MG/2ML IJ SOLN
INTRAMUSCULAR | Status: AC
  Filled 2022-09-24: qty 2

## 2022-09-24 MED ORDER — THROMBIN 5000 UNITS EX SOLR
CUTANEOUS | Status: AC
  Filled 2022-09-24: qty 5000

## 2022-09-24 MED ORDER — ONDANSETRON HCL 4 MG/2ML IJ SOLN
INTRAMUSCULAR | Status: AC
  Filled 2022-09-24: qty 2

## 2022-09-24 MED ORDER — LACTATED RINGERS IV SOLN: INTRAVENOUS | Status: DC

## 2022-09-24 MED ORDER — DOCUSATE SODIUM 100 MG PO CAPS
100.0000 mg | ORAL_CAPSULE | Freq: Two times a day (BID) | ORAL | Status: DC
  Administered 2022-09-25 – 2022-10-01 (×13): 100 mg via ORAL
  Filled 2022-09-24 (×13): qty 1

## 2022-09-24 MED ORDER — ORAL CARE MOUTH RINSE: 15.0000 mL | Freq: Once | OROMUCOSAL | Status: AC

## 2022-09-24 MED ORDER — OXYCODONE HCL 5 MG PO TABS
5.0000 mg | ORAL_TABLET | ORAL | Status: DC | PRN
  Administered 2022-09-24: 5 mg via ORAL

## 2022-09-24 MED ORDER — MENTHOL 3 MG MT LOZG: 1.0000 | LOZENGE | OROMUCOSAL | Status: DC | PRN

## 2022-09-24 MED ORDER — ONDANSETRON HCL 4 MG PO TABS: 4.0000 mg | ORAL_TABLET | Freq: Four times a day (QID) | ORAL | Status: DC | PRN

## 2022-09-24 MED ORDER — BACITRACIN ZINC 500 UNIT/GM EX OINT
TOPICAL_OINTMENT | CUTANEOUS | Status: AC
  Filled 2022-09-24: qty 28.35

## 2022-09-24 MED ORDER — PHENYLEPHRINE HCL-NACL 20-0.9 MG/250ML-% IV SOLN
INTRAVENOUS | Status: DC | PRN
  Administered 2022-09-24: 25 ug/min via INTRAVENOUS

## 2022-09-24 MED ORDER — VANCOMYCIN HCL 1000 MG IV SOLR
INTRAVENOUS | Status: AC
  Filled 2022-09-24: qty 20

## 2022-09-24 MED ORDER — SODIUM CHLORIDE 0.9 % IV SOLN
2.0000 g | INTRAVENOUS | Status: DC
  Administered 2022-09-24: 2 g via INTRAVENOUS
  Filled 2022-09-24: qty 20

## 2022-09-24 MED ORDER — HYDROMORPHONE HCL 1 MG/ML IJ SOLN
0.2500 mg | INTRAMUSCULAR | Status: DC | PRN
  Administered 2022-09-24 (×2): 0.5 mg via INTRAVENOUS

## 2022-09-24 MED ORDER — DEXAMETHASONE SODIUM PHOSPHATE 10 MG/ML IJ SOLN
INTRAMUSCULAR | Status: DC | PRN
  Administered 2022-09-24: 10 mg via INTRAVENOUS

## 2022-09-24 MED ORDER — ACETAMINOPHEN 650 MG RE SUPP: 650.0000 mg | RECTAL | Status: DC | PRN

## 2022-09-24 MED ORDER — ACETAMINOPHEN 500 MG PO TABS: 1000.0000 mg | ORAL_TABLET | Freq: Four times a day (QID) | ORAL | Status: DC

## 2022-09-24 MED ORDER — MEPERIDINE HCL 25 MG/ML IJ SOLN: 6.2500 mg | INTRAMUSCULAR | Status: DC | PRN

## 2022-09-24 MED ORDER — DEXAMETHASONE SODIUM PHOSPHATE 10 MG/ML IJ SOLN
INTRAMUSCULAR | Status: AC
  Filled 2022-09-24: qty 1

## 2022-09-24 MED ORDER — 0.9 % SODIUM CHLORIDE (POUR BTL) OPTIME
TOPICAL | Status: DC | PRN
  Administered 2022-09-24: 1000 mL

## 2022-09-24 MED ORDER — SODIUM CHLORIDE 0.9% FLUSH: 3.0000 mL | INTRAVENOUS | Status: DC | PRN

## 2022-09-24 MED ORDER — PROPOFOL 10 MG/ML IV BOLUS
INTRAVENOUS | Status: DC | PRN
  Administered 2022-09-24: 150 mg via INTRAVENOUS

## 2022-09-24 MED ORDER — PHENYLEPHRINE 80 MCG/ML (10ML) SYRINGE FOR IV PUSH (FOR BLOOD PRESSURE SUPPORT)
PREFILLED_SYRINGE | INTRAVENOUS | Status: DC | PRN
  Administered 2022-09-24 (×3): 160 ug via INTRAVENOUS

## 2022-09-24 MED ORDER — BACITRACIN ZINC 500 UNIT/GM EX OINT
TOPICAL_OINTMENT | CUTANEOUS | Status: DC | PRN
  Administered 2022-09-24: 1 via TOPICAL

## 2022-09-24 MED ORDER — FENTANYL CITRATE (PF) 250 MCG/5ML IJ SOLN
INTRAMUSCULAR | Status: DC | PRN
  Administered 2022-09-24: 50 ug via INTRAVENOUS
  Administered 2022-09-24: 150 ug via INTRAVENOUS
  Administered 2022-09-24: 50 ug via INTRAVENOUS

## 2022-09-24 MED ORDER — CHLORHEXIDINE GLUCONATE 0.12 % MT SOLN
OROMUCOSAL | Status: AC
  Administered 2022-09-24: 15 mL via OROMUCOSAL
  Filled 2022-09-24: qty 15

## 2022-09-24 MED ORDER — LIDOCAINE-EPINEPHRINE 1 %-1:100000 IJ SOLN
INTRAMUSCULAR | Status: AC
  Filled 2022-09-24: qty 1

## 2022-09-24 MED ORDER — ROCURONIUM BROMIDE 10 MG/ML (PF) SYRINGE
PREFILLED_SYRINGE | INTRAVENOUS | Status: AC
  Filled 2022-09-24: qty 10

## 2022-09-24 MED ORDER — ONDANSETRON HCL 4 MG/2ML IJ SOLN: 4.0000 mg | Freq: Four times a day (QID) | INTRAMUSCULAR | Status: DC | PRN

## 2022-09-24 MED ORDER — BISACODYL 10 MG RE SUPP: 10.0000 mg | Freq: Every day | RECTAL | Status: DC | PRN

## 2022-09-24 MED ORDER — SODIUM CHLORIDE 0.9 % IV SOLN
250.0000 mL | INTRAVENOUS | Status: DC
  Administered 2022-09-27: 250 mL via INTRAVENOUS

## 2022-09-24 MED ORDER — SODIUM CHLORIDE 0.9% FLUSH
3.0000 mL | Freq: Two times a day (BID) | INTRAVENOUS | Status: DC
  Administered 2022-09-24 – 2022-10-01 (×12): 3 mL via INTRAVENOUS

## 2022-09-24 SURGICAL SUPPLY — 38 items
BAG COUNTER SPONGE SURGICOUNT (BAG) ×1 IMPLANT
BENZOIN TINCTURE PRP APPL 2/3 (GAUZE/BANDAGES/DRESSINGS) ×1 IMPLANT
BLADE CLIPPER SURG (BLADE) IMPLANT
CANISTER SUCT 3000ML PPV (MISCELLANEOUS) ×1 IMPLANT
DRAPE LAPAROTOMY 100X72X124 (DRAPES) ×1 IMPLANT
DRAPE SURG 17X23 STRL (DRAPES) ×4 IMPLANT
DRSG OPSITE POSTOP 4X6 (GAUZE/BANDAGES/DRESSINGS) IMPLANT
ELECT REM PT RETURN 9FT ADLT (ELECTROSURGICAL) ×1
ELECTRODE REM PT RTRN 9FT ADLT (ELECTROSURGICAL) ×1 IMPLANT
GAUZE 4X4 16PLY ~~LOC~~+RFID DBL (SPONGE) IMPLANT
GAUZE SPONGE 4X4 12PLY STRL (GAUZE/BANDAGES/DRESSINGS) ×1 IMPLANT
GLOVE BIO SURGEON STRL SZ 6.5 (GLOVE) IMPLANT
GLOVE BIO SURGEON STRL SZ7 (GLOVE) IMPLANT
GLOVE BIO SURGEON STRL SZ8 (GLOVE) ×1 IMPLANT
GLOVE BIO SURGEON STRL SZ8.5 (GLOVE) ×1 IMPLANT
GLOVE BIOGEL PI IND STRL 6.5 (GLOVE) IMPLANT
GLOVE EXAM NITRILE XL STR (GLOVE) IMPLANT
GOWN STRL REUS W/ TWL LRG LVL3 (GOWN DISPOSABLE) IMPLANT
GOWN STRL REUS W/ TWL XL LVL3 (GOWN DISPOSABLE) IMPLANT
GOWN STRL REUS W/TWL LRG LVL3 (GOWN DISPOSABLE) ×2
GOWN STRL REUS W/TWL XL LVL3 (GOWN DISPOSABLE) ×1
KIT BASIN OR (CUSTOM PROCEDURE TRAY) ×1 IMPLANT
KIT STIMULAN RAPID CURE 5CC (Orthopedic Implant) IMPLANT
KIT TURNOVER KIT B (KITS) ×1 IMPLANT
NEEDLE HYPO 22GX1.5 SAFETY (NEEDLE) IMPLANT
NS IRRIG 1000ML POUR BTL (IV SOLUTION) ×1 IMPLANT
PACK LAMINECTOMY NEURO (CUSTOM PROCEDURE TRAY) ×1 IMPLANT
PAD ARMBOARD 7.5X6 YLW CONV (MISCELLANEOUS) ×3 IMPLANT
STRIP CLOSURE SKIN 1/2X4 (GAUZE/BANDAGES/DRESSINGS) ×1 IMPLANT
SUT ETHILON 2 0 PSLX (SUTURE) IMPLANT
SUT VIC AB 1 CT1 18XBRD ANBCTR (SUTURE) ×1 IMPLANT
SUT VIC AB 1 CT1 8-18 (SUTURE) ×1
SUT VIC AB 2-0 CP2 18 (SUTURE) ×1 IMPLANT
SWAB COLLECTION DEVICE MRSA (MISCELLANEOUS) IMPLANT
SWAB CULTURE ESWAB REG 1ML (MISCELLANEOUS) IMPLANT
TOWEL GREEN STERILE (TOWEL DISPOSABLE) ×1 IMPLANT
TOWEL GREEN STERILE FF (TOWEL DISPOSABLE) ×1 IMPLANT
WATER STERILE IRR 1000ML POUR (IV SOLUTION) ×1 IMPLANT

## 2022-09-24 NOTE — Care Management Important Message (Signed)
Important Message  Patient Details  Name: Nancy Simmons MRN: [id number] Date of Birth: 03-27-1956   Medicare Important Message Given:  Yes     Hannah Beat 09/24/2022, 12:01 PM

## 2022-09-24 NOTE — Progress Notes (Signed)
Patient refused cpap at this time. 

## 2022-09-24 NOTE — Op Note (Signed)
Brief history: The patient is a 67 year old morbidly obese black female on whom I performed a lumbar fusion about 2 weeks ago.  She was discharged to a skilled nursing facility and has developed drainage from her wound.  I discussed the options and recommended an incision and drainage of her wound.  She has decided proceed with surgery.  Preop diagnosis: Wound infection  Postop diagnosis: The same  Procedure: Incision and drainage of lumbar wound; placement of vancomycin beads  Surgeon: Dr. Earle Gell  Assistant: None  Anesthesia: General tracheal  Estimated blood loss: Minimal  Specimens: Cultures  Drains: None  Complications: None  Description of procedure: The patient was brought to the operating room by the anesthesia team.  General endotracheal anesthesia was induced.  She was turned to the prone position on the [place] table.  The patient's lumbosacral region was then prepared with Betadine scrub and Betadine solution.  Sterile drapes were applied.  I then used a scalpel to incise the patient's fresh surgical scar.  We immediately countered abundant purulent material in the subcutaneous space.  We obtain cultures.  We used the cerebellar retractor for exposure.  We then remove the purulent material using suction and irrigation.  I dissected deeper the infection appeared to track deep to the fascia down towards the lumbar spine.  I removed all the purulent material I could see.  We irrigated the wound out with a liter solution.  We obtained pneumostasis and bipolar cautery.  We then placed vancomycin beads deep in the wound.  I then removed the cerebellar retractor.  I reapproximated the patient's fascia with interrupted 0 Vicryl suture.  I reapproximated subcutaneous tissue with interrupted 2-0 Vicryl suture.  I reapproximated the skin with a running 2-0 nylon suture.  The wound was then coated with bacitracin ointment.  A sterile dressing was applied.  The drapes were removed.  By  report all sponge, instrument, and needle counts were correct at the end of this case.

## 2022-09-24 NOTE — Anesthesia Preprocedure Evaluation (Addendum)
Anesthesia Evaluation  Patient identified by MRN, date of birth, ID band Patient awake    Reviewed: Allergy & Precautions, NPO status , Patient's Chart, lab work & pertinent test results  History of Anesthesia Complications (+) Family history of anesthesia reaction  Airway Mallampati: III  TM Distance: >3 FB Neck ROM: Full    Dental  (+) Poor Dentition, Dental Advisory Given, Chipped, Missing,    Pulmonary asthma , sleep apnea and Continuous Positive Airway Pressure Ventilation , COPD,  COPD inhaler, Patient abstained from smoking., former smoker   Pulmonary exam normal breath sounds clear to auscultation       Cardiovascular hypertension, Pt. on medications and Pt. on home beta blockers + angina  + CAD  Normal cardiovascular exam Rhythm:Regular Rate:Normal     Neuro/Psych  PSYCHIATRIC DISORDERS  Depression       GI/Hepatic ,GERD  Medicated,,(+) Hepatitis -, Autoimmune  Endo/Other  diabetes, Type 2  Morbid obesity  Renal/GU   negative genitourinary   Musculoskeletal  (+) Arthritis ,    Abdominal  (+) + obese  Peds  Hematology   Anesthesia Other Findings   Reproductive/Obstetrics                             Anesthesia Physical Anesthesia Plan  ASA: 3  Anesthesia Plan: General   Post-op Pain Management: Dilaudid IV and Ofirmev IV (intra-op)*   Induction: Intravenous  PONV Risk Score and Plan: 4 or greater and Ondansetron, Dexamethasone and Midazolam  Airway Management Planned: Oral ETT  Additional Equipment: None  Intra-op Plan:   Post-operative Plan: Extubation in OR  Informed Consent: I have reviewed the patients History and Physical, chart, labs and discussed the procedure including the risks, benefits and alternatives for the proposed anesthesia with the patient or authorized representative who has indicated his/her understanding and acceptance.     Dental advisory  given  Plan Discussed with: CRNA  Anesthesia Plan Comments: (PAT note by Nancy Caldwell, PA-C: Follows with cardiology for history of nonobstructive CAD by cath 2018, HTN, HLD, OSA on CPAP.  She was seen by Dr. Bettina Gavia on 08/29/2022 for preop evaluation.  Per note, "Her procedure is intermediate risk for elective and from my perspective is a cardiologist she is optimized with mild nonobstructive CAD exercise tolerance of 4 METS or greater controlled hypertension and treated compliant obstructive sleep apnea."  History of PICC line associated DVT in 2019 and has been maintained on long-term anticoagulation.  Last dose Eliquis 09/06/2022.  Former smoker with associated COPD, states she is maintained on both an oral Ellipta and [place].  History of autoimmune hepatitis diagnosed in 2014, followed at [facility].  She was previously on azathioprine, however appears she has been off this for the past ~6 months due to being overdue for followup. Hepatic function normal on CMP 03/27/2022 and 08/02/2022.  Non-insulin-dependent DM2, well-controlled, A1c 6.3 on preop labs.  She is on daily GLP-1 agonist Victoza, instructed to hold 24 hours prior to surgery.  CBC from 08/02/2022 reviewed, mild anemia with hemoglobin 11.9.  CMP from 08/02/2022 reviewed, WNL.  EKG 08/29/2022 (read per Dr. 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 The left ventricular systolic function is normal. The left ventricular ejection fraction is 55-65% by  visual estimate. ? LV end diastolic pressure is mildly elevated. ? ___________________________________ ? LM lesion, 30 %stenosed. ? Ost LAD lesion, 30 %stenosed. Mid LAD lesion, 45 %stenosed. Dist LAD lesion, 50 %stenosed. - Combined FFR with these lesions was 0.87. Not  physiologically significant. ? Prox RCA lesion, 30 %stenosed and the remainder of the mid vessel is diffusely diseased but mild. No significant flow-limiting lesions  No flow-limiting lesions takes when the patient several stress test. The 2 lesions in the LAD are angiographically not significant and not significant by FFR. Either of these 2 lesions could potentially be a focal site for coronary spasm however.  Her stress test would be considered false positive.  Recommendation: Considered other noncardiac etiology for chest pain versus potential coronary spasm.  Would be stable for discharge from a cardiac Standpoint after bedrest. Defer to primary team & consult service.   )        Anesthesia Quick Evaluation

## 2022-09-24 NOTE — Anesthesia Procedure Notes (Signed)
Procedure Name: Intubation Date/Time: 09/24/2022 7:31 PM  Performed by: Rande Brunt, CRNAPre-anesthesia Checklist: Patient identified, Emergency Drugs available, Suction available and Patient being monitored Patient Re-evaluated:Patient Re-evaluated prior to induction Oxygen Delivery Method: Circle System Utilized Preoxygenation: Pre-oxygenation with 100% oxygen Induction Type: IV induction Ventilation: Mask ventilation without difficulty Laryngoscope Size: Mac and 3 Grade View: Grade I Tube type: Oral Tube size: 7.0 mm Number of attempts: 1 Airway Equipment and Method: Stylet and Oral airway Placement Confirmation: ETT inserted through vocal cords under direct vision, positive ETCO2 and breath sounds checked- equal and bilateral Secured at: 21 cm Tube secured with: Tape Dental Injury: Teeth and Oropharynx as per pre-operative assessment

## 2022-09-24 NOTE — Progress Notes (Signed)
Patient arrived back to unit via PACU. Patient Aox4, vitals WNL and focused assessment completed. Honeycomb dressing to lower back clean, dry and intact.    Call light in reach, bed alarm on and bed in the lowest position.

## 2022-09-24 NOTE — Progress Notes (Signed)
Subjective: The patient is alert and pleasant.  She has no complaints.  Objective: Vital signs in last 24 hours: Temp:  [98.7 F (37.1 C)-99.6 F (37.6 C)] 99.6 F (37.6 C) (03/04 0333) Pulse Rate:  [80-87] 80 (03/03 1923) Resp:  [15-20] 20 (03/04 0333) BP: (115-138)/(45-61) 115/45 (03/04 0333) SpO2:  [93 %-94 %] 94 % (03/03 1459) Estimated body mass index is 54.84 kg/m as calculated from the following:   Height as of this encounter: '5\' 2"'$  (1.575 m).   Weight as of this encounter: 136 kg.   Intake/Output from previous day: 03/03 0701 - 03/04 0700 In: -  Out: 550 [Urine:550] Intake/Output this shift: No intake/output data recorded.  Physical exam the patient is alert and pleasant.  Her strength is normal.  Her dressing from yesterday has some purulent discharge.  Lab Results: Recent Labs    09/21/22 1529 09/23/22 0727  WBC 11.3* 7.6  HGB 10.4* 9.7*  HCT 33.4* 30.4*  PLT 305 284   BMET Recent Labs    09/21/22 1529 09/23/22 0727  NA 139 138  K 3.8 3.6  CL 106 110  CO2 24 22  GLUCOSE 107* 109*  BUN 8 10  CREATININE 0.60 0.66  CALCIUM 8.9 8.8*    Studies/Results: No results found.  Assessment/Plan: Wound infection: I discussed the situation with the patient.  I recommend an incision and drainage of her wound.  We discussed the procedure.  I have answered all her questions.  She wants to proceed with surgery.  LOS: 3 days     [name] 09/24/2022, 7:59 AM     Patient ID: Nancy Simmons, female   DOB: Apr 12, 1956, 67 y.o.   MRN: [id number]

## 2022-09-24 NOTE — Transfer of Care (Signed)
Immediate Anesthesia Transfer of Care Note  Patient: Nancy Simmons  Procedure(s) Performed: I&D LUMBAR WOUND (Back)  Patient Location: PACU  Anesthesia Type:General  Level of Consciousness: awake, alert , and oriented  Airway & Oxygen Therapy: Patient Spontanous Breathing  Post-op Assessment: Report given to RN, Post -op Vital signs reviewed and stable, and Patient moving all extremities X 4  Post vital signs: Reviewed and stable  Last Vitals:  Vitals Value Taken Time  BP 129/67 09/24/22 2034  Temp 37.1 C 09/24/22 2035  Pulse 69 09/24/22 2036  Resp 16 09/24/22 2036  SpO2 92 % 09/24/22 2036  Vitals shown include unvalidated device data.  Last Pain:  Vitals:   09/24/22 1838  TempSrc: Oral  PainSc: 0-No pain         Complications: No notable events documented.

## 2022-09-24 NOTE — Progress Notes (Signed)
Pt off unit to OR. Pre-procedure checklist completed and report given to short stay. IV team was able to get access into pt's rt forearm at this time. Vancomycin sent down with pt.   Justice Rocher, RN

## 2022-09-25 ENCOUNTER — Encounter (HOSPITAL_COMMUNITY): Payer: Self-pay | Admitting: Neurosurgery

## 2022-09-25 DIAGNOSIS — E119 Type 2 diabetes mellitus without complications: Secondary | ICD-10-CM

## 2022-09-25 DIAGNOSIS — Z6841 Body Mass Index (BMI) 40.0 and over, adult: Secondary | ICD-10-CM

## 2022-09-25 DIAGNOSIS — L7682 Other postprocedural complications of skin and subcutaneous tissue: Secondary | ICD-10-CM | POA: Diagnosis not present

## 2022-09-25 DIAGNOSIS — A4901 Methicillin susceptible Staphylococcus aureus infection, unspecified site: Secondary | ICD-10-CM | POA: Diagnosis not present

## 2022-09-25 DIAGNOSIS — T847XXA Infection and inflammatory reaction due to other internal orthopedic prosthetic devices, implants and grafts, initial encounter: Secondary | ICD-10-CM | POA: Diagnosis not present

## 2022-09-25 LAB — GLUCOSE, CAPILLARY
Glucose-Capillary: 183 mg/dL — ABNORMAL HIGH (ref 70–99)
Glucose-Capillary: 177 mg/dL — ABNORMAL HIGH (ref 70–99)

## 2022-09-25 LAB — VANCOMYCIN, TROUGH: Vancomycin Tr: 8 ug/mL — ABNORMAL LOW (ref 15–20)

## 2022-09-25 MED ORDER — VANCOMYCIN HCL IN DEXTROSE 1-5 GM/200ML-% IV SOLN
1000.0000 mg | Freq: Two times a day (BID) | INTRAVENOUS | Status: DC
  Administered 2022-09-25 – 2022-09-27 (×4): 1000 mg via INTRAVENOUS
  Filled 2022-09-25 (×4): qty 200

## 2022-09-25 NOTE — Progress Notes (Signed)
Pharmacy Antibiotic Note  Nancy Simmons is a 67 y.o. female admitted on 09/21/2022 with  wound infection .  Pharmacy has been consulted for vancomycin dosing.    SCr 0.66 and CrCl 89 ml/min.   Vancomycin trough 8 - subtherapeutic .  Plan: Increase vancomycin to '1000mg'$  IV q12h given subtherapeutic trough level.  -f/u cultures for susceptibilities  Monitor signs/symptoms of infection  Height: '5\' 2"'$  (157.5 cm) Weight: 136 kg (299 lb 13.2 oz) IBW/kg (Calculated) : 50.1  Temp (24hrs), Avg:98.3 F (36.8 C), Min:97.9 F (36.6 C), Max:98.8 F (37.1 C)  Estimated Creatinine Clearance: 89.8 mL/min (by C-G formula based on SCr of 0.66 mg/dL).    Allergies  Allergen Reactions   Acetaminophen Other (See Comments)    PATIENT HAS AUTOIMMUNE HEPATITIS PATIENT IS * NOT * TO RECEIVE ANY ACETAMINOPHEN   Penicillin G Rash and Other (See Comments)    Has patient had a PCN reaction causing immediate rash, facial/tongue/throat swelling, SOB or lightheadedness with hypotension: No Has patient had a PCN reaction causing severe rash involving mucus membranes or skin necrosis: No Has patient had a PCN reaction that required hospitalization: No Has patient had a PCN reaction occurring within the last 10 years: #  #  #  YES  #  #  #     Antimicrobials this admission: Vancomycin 3/1> CTX 3/4> 3/5  Dose adjustments this admission: 3/4: vanc 1750 q24  > vanc 1000 q12h  Microbiology results: 3/1 BCX: NGTD 3/4 Lumbar Wound Cx: staph aureus   Thank you for allowing pharmacy to be a part of this patient's care.  Carolin Guernsey 09/25/2022 2:57 PM

## 2022-09-25 NOTE — Progress Notes (Signed)
Orthopedic Tech Progress Note Patient Details:  Nancy Simmons Aug 11, 1955 [id number]  Ortho Devices Type of Ortho Device: Lumbar corsett Ortho Device/Splint Location: delivered to room and fit Ortho Device/Splint Interventions: Ordered, Application, Adjustment   Post Interventions Patient Tolerated: Well Instructions Provided: Care of device, Adjustment of device  Karolee Stamps 09/25/2022, 6:35 AM

## 2022-09-25 NOTE — Evaluation (Signed)
Physical Therapy Evaluation Patient Details Name: Nancy Simmons MRN: [id number] DOB: 08-04-1955 Today's Date: 09/25/2022  History of Present Illness  67 year old female underwent an L5-S1 PLIF by Dr. Arnoldo Morale on 09/10/2022 and was discharged to [facility] for continued rehabilitation. Presented to ED 09/21/22 with history of lumbar wound bleeding since 2/27. 3/4 I&D of lumbar wound; PMH asthma, autoimmune hepatitis, COPD, CAD (on Eliquis), GERD, high cholesterol, hypertension, sleep apnea, and type II diabetes  Clinical Impression   Pt admitted secondary to problem above with deficits below. PTA patient was in a SNF for rehab post back surgery. She reports she has done very little mobility due to wound began bleeding and they stopped mobilizing her. She used a rollator prior to surgery and lives alone.  Pt currently requires up to min assist for mobility with good adherence to back precautions. She required max assist to don her brace as she does not have a bra here. She anticipates return to SNF for antibiotics, wound care and continued rehab.  Anticipate patient will benefit from PT to address problems listed below.Will continue to follow acutely to maximize functional mobility independence and safety.          Recommendations for follow up therapy are one component of a multi-disciplinary discharge planning process, led by the attending physician.  Recommendations may be updated based on patient status, additional functional criteria and insurance authorization.  Follow Up Recommendations Skilled nursing-short term rehab (<3 hours/day) Can patient physically be transported by private vehicle: Yes    Assistance Recommended at Discharge PRN  Patient can return home with the following  Assistance with cooking/housework;Assist for transportation;Help with stairs or ramp for entrance    Equipment Recommendations Other (comment) (TBD)  Recommendations for Other Services        Functional Status Assessment Patient has had a recent decline in their functional status and demonstrates the ability to make significant improvements in function in a reasonable and predictable amount of time.     Precautions / Restrictions Precautions Precautions: Back;Fall Precaution Booklet Issued: No Precaution Comments: pt able to verbalize and adhere to back precautions Required Braces or Orthoses: Spinal Brace Spinal Brace: Lumbar corset;Applied in standing position (attempted sitting and unable to fasten in sitting)      Mobility  Bed Mobility Overal bed mobility: Needs Assistance Bed Mobility: Rolling, Sidelying to Sit Rolling: Supervision Sidelying to sit: Min guard, HOB elevated       General bed mobility comments: pt adhered to back precautions; HOB ~20 with use of rail    Transfers Overall transfer level: Needs assistance Equipment used: Rolling walker (2 wheels) Transfers: Sit to/from Stand Sit to Stand: Min guard           General transfer comment: proper hand placement; denied dizziness    Ambulation/Gait Ambulation/Gait assistance: Min guard Gait Distance (Feet): 15 Feet Assistive device: Rolling walker (2 wheels) Gait Pattern/deviations: Step-through pattern, Decreased stride length, Wide base of support   Gait velocity interpretation: 1.31 - 2.62 ft/sec, indicative of limited community ambulator   General Gait Details: Good upright posture; no assist needed to maneuver RW in hospital room  Stairs            Wheelchair Mobility    Modified Rankin (Stroke Patients Only)       Balance Overall balance assessment: Mild deficits observed, not formally tested  Pertinent Vitals/Pain Pain Assessment Pain Assessment: Faces Faces Pain Scale: Hurts a little bit Pain Location: back- incisional Pain Descriptors / Indicators: Discomfort, Operative site guarding Pain  Intervention(s): Monitored during session, Limited activity within patient's tolerance, Premedicated before session    Home Living Family/patient expects to be discharged to:: Skilled nursing facility                        Prior Function Prior Level of Function : Needs assist             Mobility Comments: used rollator prior to back surgery; mobility has been limited by SNF staff due to wound bleeding       Hand Dominance   Dominant Hand: Right    Extremity/Trunk Assessment   Upper Extremity Assessment Upper Extremity Assessment: Overall WFL for tasks assessed    Lower Extremity Assessment Lower Extremity Assessment: Overall WFL for tasks assessed    Cervical / Trunk Assessment Cervical / Trunk Assessment: Back Surgery  Communication   Communication: No difficulties  Cognition Arousal/Alertness: Awake/alert Behavior During Therapy: WFL for tasks assessed/performed Overall Cognitive Status: No family/caregiver present to determine baseline cognitive functioning                                 General Comments: oriented; follows all commands; appears [facility] but not specifically tested        General Comments General comments (skin integrity, edema, etc.): Pt required assist to don back brace. States if she has her bra, she can don independently.    Exercises     Assessment/Plan    PT Assessment Patient needs continued PT services  PT Problem List Decreased strength;Decreased activity tolerance;Decreased balance;Decreased mobility;Pain;Decreased knowledge of use of DME       PT Treatment Interventions DME instruction;Gait training;Functional mobility training;Therapeutic exercise;Therapeutic activities;Balance training;Patient/family education    PT Goals (Current goals can be found in the Care Plan section)  Acute Rehab PT Goals Patient Stated Goal: return to rehab for antibiotics (?IV) and wound care PT Goal Formulation: With  patient Time For Goal Achievement: 10/09/22 Potential to Achieve Goals: Good    Frequency Min 5X/week     Co-evaluation               AM-PAC PT "6 Clicks" Mobility  Outcome Measure Help needed turning from your back to your side while in a flat bed without using bedrails?: A Little Help needed moving from lying on your back to sitting on the side of a flat bed without using bedrails?: A Little Help needed moving to and from a bed to a chair (including a wheelchair)?: A Little Help needed standing up from a chair using your arms (e.g., wheelchair or bedside chair)?: A Little Help needed to walk in hospital room?: A Little Help needed climbing 3-5 steps with a railing? : Total 6 Click Score: 16    End of Session Equipment Utilized During Treatment: Back brace Activity Tolerance: Patient tolerated treatment well Patient left: in chair;with call bell/phone within reach;with chair alarm set Nurse Communication: Mobility status PT Visit Diagnosis: Other abnormalities of gait and mobility (R26.89);Pain Pain - Right/Left:  (midline) Pain - part of body:  (back)    Time: [id number] PT Time Calculation (min) (ACUTE ONLY): 22 min   Charges:   PT Evaluation $PT Eval Low Complexity: 1 Low  Arby Barrette, PT Acute Rehabilitation Services  Office [phone]   Rexanne Mano 09/25/2022, 2:07 PM

## 2022-09-25 NOTE — Consult Note (Signed)
[facility] for Infectious Disease    Date of Admission:  09/21/2022     Reason for Consult: Lumbar hardware infection     Referring Physician: Dr Arnoldo Morale  Current antibiotics: Vancomycin Ceftriaxone  ASSESSMENT:    67 y.o. female admitted with:  Staphylococcus aureus hardware complicating wound infection: Patient's status post prior L4-L5 instrumented fusion with recent extension of this fusion to L5-S1 on 0000000 complicated by surgical site incisional drainage.  Taken to the OR for I&D 09/24/2022 where purulent material was encountered including deep to the hardware.  Operative cultures are growing Staphylococcus aureus with sensitivities pending. Diabetes: Hemoglobin A1c earlier this year 6.3. Obesity: Body mass index is 54.84 kg/m.   RECOMMENDATIONS:    Continue vancomycin per pharmacy Will stop ceftriaxone Await sensitivities of Staph aureus Okay to place PICC line Anticipate prolonged course of IV antibiotics followed by transition to oral suppressive therapy for a duration to be determined in the setting of infected hardware Following   Principal Problem:   Postoperative complication of skin involving drainage from surgical wound Active Problems:   Wound infection complicating hardware ([place])   MEDICATIONS:    Scheduled Meds:  amLODipine  5 mg Oral Daily   docusate sodium  100 mg Oral BID   escitalopram  10 mg Oral Daily   fluticasone  2 spray Each Nare Daily   hydrALAZINE  10 mg Oral QID   insulin aspart  0-20 Units Subcutaneous TID WC   linaclotide  145 mcg Oral QAC breakfast   metoprolol tartrate  50 mg Oral BID   pantoprazole  40 mg Oral Daily   pregabalin  100 mg Oral TID   rOPINIRole  1 mg Oral QHS   rosuvastatin  5 mg Oral Daily   sodium chloride flush  3 mL Intravenous Q12H   umeclidinium-vilanterol  1 puff Inhalation Daily   Continuous Infusions:  sodium chloride     vancomycin Stopped (09/24/22 1909)   PRN Meds:.albuterol, albuterol,  bisacodyl, cyclobenzaprine, diclofenac Sodium, HYDROmorphone (DILAUDID) injection, meclizine, menthol-cetylpyridinium **OR** phenol, metoprolol tartrate, morphine injection, ondansetron **OR** ondansetron (ZOFRAN) IV, oxyCODONE, oxyCODONE, polyethylene glycol, sodium chloride flush, sodium phosphate, tiZANidine, zolpidem  HPI:    Nancy Simmons is a 67 y.o. female with past medical history of L4-5 spinal fusion who underwent L5-S1 extension of her fusion on 09/10/2022 with neurosurgery.  She was discharged to a skilled nursing facility.  Her nursing facility contacted Dr. Arnoldo Morale office on 2/27 to report bleeding from her surgical wound.  An attempt by neurosurgery was made to get the patient into the office 2 days later but the nursing facility did not have transportation.  A prescription for doxycycline was sent to the pharmacy, but this was not started by her facility.  She continued to have bleeding through the dressing.  There was no fevers and the drainage was not reported as overtly purulent.  She presented to the hospital as a result of this issue on 09/21/2022.  She was started on antibiotics with vancomycin.  Ceftriaxone was added yesterday.  She was taken to the OR as well yesterday by Dr. Arnoldo Morale for incision and drainage where immediately they encountered purulent material in the subcutaneous space.  Cultures were obtained.  Dissection was carried down deeper with infection appearing to track deep to the fascia and towards the lumbar spine involving the hardware.  This was irrigated and drained.  Her baseline inflammatory markers are elevated.  Blood cultures were obtained 4 days ago following a  dose of vancomycin, but are currently no growth.  Her surgical cultures are growing Staphylococcus aureus.  Susceptibilities are pending.  We have been consulted for antibiotic recommendations.   Past Medical History:  Diagnosis Date   Arthritis    "pretty much all over" (04/03/2017)   Asthma     Autoimmune hepatitis ([place])    COPD (chronic obstructive pulmonary disease) (HCC)    Coronary artery disease    a. mild-mod by 03/2017 following false positive nuc - She subsequently underwent cath with 30% LM, 30% ostial LAD, 45% mLAD, 50% dLAD, 30% prox RCA, no flow limiting lesions, LVEF 55-65%.    Family history of adverse reaction to anesthesia    "granddaughter has PONV"   GERD (gastroesophageal reflux disease)    High cholesterol    History of blood transfusion    "in [place]; related to knee surgeries"   History of kidney stones    Hypertension    OSA on CPAP    CPAP, pressure settings 11   Type II diabetes mellitus ([place])     Social History   Tobacco Use   Smoking status: Former    Packs/day: 0.50    Years: 43.00    Total pack years: 21.50    Types: Cigarettes    Start date: 07/23/1972    Quit date: 12/06/2017    Years since quitting: 4.8   Smokeless tobacco: Never  Vaping Use   Vaping Use: Every day  Substance Use Topics   Alcohol use: No    Alcohol/week: 0.0 standard drinks of alcohol   Drug use: Not Currently    Types: "Crack" cocaine    Comment: Remote h/o cocaine use    Family History  Problem Relation Age of Onset   CAD Mother    Heart disease Mother        Enlarged heart   CAD Father    Diabetes Father    Heart failure Father    CAD Brother    Diabetes Brother    Heart disease Brother        Defibrillator   Colon cancer Other    Stroke Other     Allergies  Allergen Reactions   Acetaminophen Other (See Comments)    PATIENT HAS AUTOIMMUNE HEPATITIS PATIENT IS * NOT * TO RECEIVE ANY ACETAMINOPHEN   Penicillin G Rash and Other (See Comments)    Has patient had a PCN reaction causing immediate rash, facial/tongue/throat swelling, SOB or lightheadedness with hypotension: No Has patient had a PCN reaction causing severe rash involving mucus membranes or skin necrosis: No Has patient had a PCN reaction that required hospitalization: No Has patient  had a PCN reaction occurring within the last 10 years: #  #  #  YES  #  #  #     Review of Systems  All other systems reviewed and are negative.  Except as noted above in the HPI OBJECTIVE:   Blood pressure 100/60, pulse 60, temperature 98.5 F (36.9 C), temperature source Axillary, resp. rate 13, height '5\' 2"'$  (1.575 m), weight 136 kg, SpO2 98 %. Body mass index is 54.84 kg/m.  Physical Exam Constitutional:      General: She is not in acute distress.    Appearance: Normal appearance.  HENT:     Head: Normocephalic and atraumatic.  Eyes:     Extraocular Movements: Extraocular movements intact.     Conjunctiva/sclera: Conjunctivae normal.  Pulmonary:     Effort: Pulmonary effort is normal. No respiratory  distress.  Abdominal:     General: There is no distension.     Palpations: Abdomen is soft.  Musculoskeletal:        General: Normal range of motion.     Cervical back: Normal range of motion and neck supple.  Skin:    General: Skin is warm and dry.  Neurological:     General: No focal deficit present.     Mental Status: She is alert and oriented to person, place, and time.  Psychiatric:        Mood and Affect: Mood normal.        Behavior: Behavior normal.      Lab Results: Lab Results  Component Value Date   WBC 7.6 09/23/2022   HGB 9.7 (L) 09/23/2022   HCT 30.4 (L) 09/23/2022   MCV 95.0 09/23/2022   PLT 284 09/23/2022    Lab Results  Component Value Date   NA 138 09/23/2022   K 3.6 09/23/2022   CO2 22 09/23/2022   GLUCOSE 109 (H) 09/23/2022   BUN 10 09/23/2022   CREATININE 0.66 09/23/2022   CALCIUM 8.8 (L) 09/23/2022   GFRNONAA >60 09/23/2022   GFRAA >60 08/31/2019    Lab Results  Component Value Date   ALT 29 08/02/2022   AST 36 08/02/2022   ALKPHOS 87 08/02/2022   BILITOT 0.5 08/02/2022       Component Value Date/Time   CRP 2.7 (H) 09/21/2022 1529       Component Value Date/Time   ESRSEDRATE 76 (H) 09/21/2022 1529    I have  reviewed the micro and lab results in Epic.  Imaging: No results found.   Imaging independently reviewed in Epic.  [name] for [facility]ious [facility] [phone] pager 09/25/2022, 12:18 PM

## 2022-09-25 NOTE — Anesthesia Postprocedure Evaluation (Signed)
Anesthesia Post Note  Patient: Nancy Simmons  Procedure(s) Performed: I&D LUMBAR WOUND (Back)     Patient location during evaluation: PACU Anesthesia Type: General Level of consciousness: sedated and patient cooperative Pain management: pain level controlled Vital Signs Assessment: post-procedure vital signs reviewed and stable Respiratory status: spontaneous breathing Cardiovascular status: stable Anesthetic complications: no  No notable events documented.  Last Vitals:  Vitals:   09/25/22 1824 09/25/22 1936  BP: (!) 129/58 135/62  Pulse: 74 75  Resp:  16  Temp: 36.6 C 37.1 C  SpO2: 95% 94%    Last Pain:  Vitals:   09/25/22 1936  TempSrc: Oral  PainSc:                  Nolon Nations

## 2022-09-25 NOTE — Progress Notes (Signed)
Patient ID: Nancy Simmons, female   DOB: 09/08/1955, 67 y.o.   MRN: [id number] Subjective: The patient is alert and pleasant.  She is in no apparent distress.  She has no complaints.  Objective: Vital signs in last 24 hours: Temp:  [97.9 F (36.6 C)-98.8 F (37.1 C)] 98.5 F (36.9 C) (03/05 0725) Pulse Rate:  [53-78] 58 (03/05 0725) Resp:  [11-20] 13 (03/05 0725) BP: (91-141)/(54-97) 100/60 (03/05 0725) SpO2:  [92 %-98 %] 98 % (03/05 0725) Estimated body mass index is 54.84 kg/m as calculated from the following:   Height as of this encounter: '5\' 2"'$  (1.575 m).   Weight as of this encounter: 136 kg.   Intake/Output from previous day: 03/04 0701 - 03/05 0700 In: 500 [I.V.:500] Out: 750 [Urine:750] Intake/Output this shift: No intake/output data recorded.  Physical exam the patient is alert and pleasant.  Her strength is normal.  Lab Results: Recent Labs    09/23/22 0727  WBC 7.6  HGB 9.7*  HCT 30.4*  PLT 284   BMET Recent Labs    09/23/22 0727  NA 138  K 3.6  CL 110  CO2 22  GLUCOSE 109*  BUN 10  CREATININE 0.66  CALCIUM 8.8*    Studies/Results: No results found.  Assessment/Plan: Postop day 1: The patient's Gram stain shows gram-positive cocci.  She is on empiric vancomycin and ceftriaxone awaiting the culture results.  I will ask ID to see the patient.  LOS: 4 days     [name] 09/25/2022, 7:52 AM

## 2022-09-26 ENCOUNTER — Inpatient Hospital Stay: Payer: Self-pay

## 2022-09-26 DIAGNOSIS — T847XXD Infection and inflammatory reaction due to other internal orthopedic prosthetic devices, implants and grafts, subsequent encounter: Secondary | ICD-10-CM | POA: Diagnosis not present

## 2022-09-26 DIAGNOSIS — Z6841 Body Mass Index (BMI) 40.0 and over, adult: Secondary | ICD-10-CM | POA: Diagnosis not present

## 2022-09-26 DIAGNOSIS — A4901 Methicillin susceptible Staphylococcus aureus infection, unspecified site: Secondary | ICD-10-CM | POA: Diagnosis not present

## 2022-09-26 LAB — CULTURE, BLOOD (ROUTINE X 2)
Culture: NO GROWTH
Culture: NO GROWTH
Special Requests: ADEQUATE

## 2022-09-26 LAB — GLUCOSE, CAPILLARY: Glucose-Capillary: 118 mg/dL — ABNORMAL HIGH (ref 70–99)

## 2022-09-26 MED ORDER — SODIUM CHLORIDE 0.9 % IV SOLN
2.0000 g | Freq: Three times a day (TID) | INTRAVENOUS | Status: DC
  Administered 2022-09-26 – 2022-10-01 (×16): 2 g via INTRAVENOUS
  Filled 2022-09-26 (×16): qty 12.5

## 2022-09-26 NOTE — Progress Notes (Signed)
   Providing Compassionate, Quality Care - Together   Subjective: Patient reports no new issues. IV team was unable to place a PICC line.  Objective: Vital signs in last 24 hours: Temp:  [97.7 F (36.5 C)-98.8 F (37.1 C)] 97.7 F (36.5 C) (03/06 1135) Pulse Rate:  [52-75] 55 (03/06 1135) Resp:  [14-18] 18 (03/06 1135) BP: (102-144)/(54-71) 128/56 (03/06 1135) SpO2:  [93 %-97 %] 97 % (03/06 1135) FiO2 (%):  [21 %] 21 % (03/05 2319)  Intake/Output from previous day: 03/05 0701 - 03/06 0700 In: 425.4 [P.O.:240; IV Piggyback:185.4] Out: 300 [Urine:300] Intake/Output this shift: Total I/O In: -  Out: 500 [Urine:500]  Alert and oriented x 4 PERRLA CN II-XII grossly intact MAE, Strength and sensation intact   Lab Results: No results for input(s): "WBC", "HGB", "HCT", "PLT" in the last 72 hours. BMET No results for input(s): "NA", "K", "CL", "CO2", "GLUCOSE", "BUN", "CREATININE", "CALCIUM" in the last 72 hours.  Studies/Results: [place] EKG SITE RITE  Result Date: 09/26/2022 If Site Rite image not attached, placement could not be confirmed due to current cardiac rhythm.   Assessment/Plan: The patient underwent an L5-S1 PLIF by Dr. Arnoldo Morale on 09/10/2022. She was discharged to [facility] for continued rehabilitation. She was readmitted to [facility] on 09/21/2022 with copious wound drainage. She was taken back to the OR on 09/24/2022 for a wound washout. Her wound culture grew few Staph aureus and rare Pseudomonas aeruginosa. The patient will need IV antibiotics. PICC line was attempted at the bedside without success on 09/26/2022. IR consulted for PICC placement.   LOS: 5 days   -PICC line placement tentatively planned for tomorrow. -Plans for discharge back to SNF with IV antibiotics once PICC line is placed.   Viona Gilmore, DNP, AGNP-C Nurse Practitioner  [facility] [place] [address], [address], [place], [place] [address] P:  [phone]    F: [phone]  09/26/2022, 2:51 PM

## 2022-09-26 NOTE — Progress Notes (Signed)
Physical Therapy Treatment Patient Details Name: Nancy Simmons MRN: [id number] DOB: September 22, 1955 Today's Date: 09/26/2022   History of Present Illness 67 year old female underwent an L5-S1 PLIF by Dr. Arnoldo Morale on 09/10/2022 and was discharged to [facility] for continued rehabilitation. Presented to ED 09/21/22 with history of lumbar wound bleeding since 2/27. 3/4 I&D of lumbar wound; PMH asthma, autoimmune hepatitis, COPD, CAD (on Eliquis), GERD, high cholesterol, hypertension, sleep apnea, and type II diabetes    PT Comments    Pt received in bed, agreeable to participation in therapy. Assisted to/from bathroom prior to ambulation in hallway. Min assist bed mobility, min guard assist transfers, and min guard assist amb 150' with RW. Pt in recliner with feet elevated at end of session.    Recommendations for follow up therapy are one component of a multi-disciplinary discharge planning process, led by the attending physician.  Recommendations may be updated based on patient status, additional functional criteria and insurance authorization.  Follow Up Recommendations  Skilled nursing-short term rehab (<3 hours/day) Can patient physically be transported by private vehicle: Yes   Assistance Recommended at Discharge    Patient can return home with the following Assistance with cooking/housework;Assist for transportation;Help with stairs or ramp for entrance   Equipment Recommendations  Other (comment) (TBD)    Recommendations for Other Services       Precautions / Restrictions Precautions Precautions: Back;Fall Precaution Comments: reviewed 3/3 back precautions Required Braces or Orthoses: Spinal Brace Spinal Brace: Lumbar corset;Applied in standing position Restrictions Other Position/Activity Restrictions: brace donned in stand due to body habitus     Mobility  Bed Mobility Overal bed mobility: Needs Assistance Bed Mobility: Rolling, Sidelying to Sit Rolling:  Supervision Sidelying to sit: Min assist       General bed mobility comments: +rail, assist to elevate trunk    Transfers Overall transfer level: Needs assistance Equipment used: Rolling walker (2 wheels) Transfers: Sit to/from Stand Sit to Stand: Min guard                Ambulation/Gait Ambulation/Gait assistance: Min guard Gait Distance (Feet): 150 Feet Assistive device: Rolling walker (2 wheels) Gait Pattern/deviations: Step-through pattern, Decreased step length - left, Wide base of support Gait velocity: decreased Gait velocity interpretation: 1.31 - 2.62 ft/sec, indicative of limited community ambulator   General Gait Details: steady gait with RW. Fatigue noted at end of amb.   Stairs             Wheelchair Mobility    Modified Rankin (Stroke Patients Only)       Balance Overall balance assessment: Mild deficits observed, not formally tested Sitting-balance support: No upper extremity supported, Feet supported Sitting balance-Leahy Scale: Good     Standing balance support: Bilateral upper extremity supported, During functional activity, Reliant on assistive device for balance Standing balance-Leahy Scale: Poor                              Cognition Arousal/Alertness: Awake/alert Behavior During Therapy: WFL for tasks assessed/performed Overall Cognitive Status: No family/caregiver present to determine baseline cognitive functioning                                 General Comments: oriented; follows all commands; appears [facility] but not specifically tested        Exercises      General Comments  General comments (skin integrity, edema, etc.): VSS on RA      Pertinent Vitals/Pain Pain Assessment Pain Assessment: Faces Faces Pain Scale: Hurts a little bit Pain Location: back Pain Descriptors / Indicators: Discomfort Pain Intervention(s): Repositioned, Monitored during session    Home Living                           Prior Function            PT Goals (current goals can now be found in the care plan section) Acute Rehab PT Goals Patient Stated Goal: rehab then home Progress towards PT goals: Progressing toward goals    Frequency    Min 5X/week      PT Plan Current plan remains appropriate    Co-evaluation              AM-PAC PT "6 Clicks" Mobility   Outcome Measure  Help needed turning from your back to your side while in a flat bed without using bedrails?: A Little Help needed moving from lying on your back to sitting on the side of a flat bed without using bedrails?: A Little Help needed moving to and from a bed to a chair (including a wheelchair)?: A Little Help needed standing up from a chair using your arms (e.g., wheelchair or bedside chair)?: A Little Help needed to walk in hospital room?: A Little Help needed climbing 3-5 steps with a railing? : Total 6 Click Score: 16    End of Session Equipment Utilized During Treatment: Back brace Activity Tolerance: Patient tolerated treatment well Patient left: in chair;with call bell/phone within reach Nurse Communication: Mobility status PT Visit Diagnosis: Other abnormalities of gait and mobility (R26.89);Pain     Time: [id number] PT Time Calculation (min) (ACUTE ONLY): 24 min  Charges:  $Gait Training: 23-37 mins                     Gloriann Loan., PT  Office # [phone]    Lorriane Shire 09/26/2022, 12:05 PM

## 2022-09-26 NOTE — Progress Notes (Addendum)
Pharmacy Antibiotic Note  Nancy Simmons is a 67 y.o. female admitted on 09/21/2022 with  wound infection .  Pharmacy has been consulted for vancomycin and cefepime dosing given wound culture results (see below).    SCr 0.66 and CrCl 89 ml/min.   Plan: Continue vancomycin at '1000mg'$  IV q12h  -vanc levels PRN per protocol Initiate cefepime 2g IV q8h -f/u cultures for susceptibilities of staph aureus  -f/u LOT   Height: '5\' 2"'$  (157.5 cm) Weight: 136 kg (299 lb 13.2 oz) IBW/kg (Calculated) : 50.1  Temp (24hrs), Avg:98.4 F (36.9 C), Min:97.8 F (36.6 C), Max:98.8 F (37.1 C)  Estimated Creatinine Clearance: 89.8 mL/min (by C-G formula based on SCr of 0.66 mg/dL).    Allergies  Allergen Reactions   Acetaminophen Other (See Comments)    PATIENT HAS AUTOIMMUNE HEPATITIS PATIENT IS * NOT * TO RECEIVE ANY ACETAMINOPHEN   Penicillin G Rash and Other (See Comments)    Has patient had a PCN reaction causing immediate rash, facial/tongue/throat swelling, SOB or lightheadedness with hypotension: No Has patient had a PCN reaction causing severe rash involving mucus membranes or skin necrosis: No Has patient had a PCN reaction that required hospitalization: No Has patient had a PCN reaction occurring within the last 10 years: #  #  #  YES  #  #  #     Antimicrobials this admission: Vancomycin 3/1> CTX 3/4> 3/5 Cefepime 3/6>   Dose adjustments this admission: 3/4: vanc 1750 q24  > vanc 1000 q12h  Microbiology results: 3/1 BCX: NGTD 3/4 Lumbar Wound Cx: staph aureus , pseudomonas aeruginosa    Thank you for allowing pharmacy to be a part of this patient's care.  Wilson Singer, PharmD Clinical Pharmacist 09/26/2022 8:19 AM

## 2022-09-26 NOTE — Progress Notes (Signed)
[facility] for Infectious Disease  Date of Admission:  09/21/2022           Reason for visit: Follow up on lumbar wound infection with hardware  Current antibiotics: Vancomycin Cefepime   ASSESSMENT:    67 y.o. female admitted with:  Polymicrobial hardware complicating wound infection: Patient's status post prior L4-L5 instrumented fusion with recent extension of this fusion to L5-S1 on 0000000 complicated by surgical site incisional drainage.  Taken to the OR for I&D 09/24/2022 where purulent material was encountered including deep to the hardware.  Operative cultures are growing Staphylococcus aureus and GNR with sensitivities pending. Diabetes: Hemoglobin A1c earlier this year 6.3. Obesity: Body mass index is 54.84 kg/m.   RECOMMENDATIONS:    Continue vancomycin Add cefepime Okay to place PICC line Await micro results to determine final Abx plan Following   Principal Problem:   Postoperative complication of skin involving drainage from surgical wound Active Problems:   Wound infection complicating hardware (HCC)   Staph aureus infection   Class 3 severe obesity due to excess calories with serious comorbidity and body mass index (BMI) of 50.0 to 59.9 in adult [facility])    MEDICATIONS:    Scheduled Meds:  amLODipine  5 mg Oral Daily   docusate sodium  100 mg Oral BID   escitalopram  10 mg Oral Daily   fluticasone  2 spray Each Nare Daily   hydrALAZINE  10 mg Oral QID   insulin aspart  0-20 Units Subcutaneous TID WC   linaclotide  145 mcg Oral QAC breakfast   metoprolol tartrate  50 mg Oral BID   pantoprazole  40 mg Oral Daily   pregabalin  100 mg Oral TID   rOPINIRole  1 mg Oral QHS   rosuvastatin  5 mg Oral Daily   sodium chloride flush  3 mL Intravenous Q12H   umeclidinium-vilanterol  1 puff Inhalation Daily   Continuous Infusions:  sodium chloride     vancomycin 1,000 mg (09/26/22 0419)   PRN Meds:.albuterol, albuterol, bisacodyl,  cyclobenzaprine, diclofenac Sodium, HYDROmorphone (DILAUDID) injection, meclizine, menthol-cetylpyridinium **OR** phenol, metoprolol tartrate, morphine injection, ondansetron **OR** ondansetron (ZOFRAN) IV, oxyCODONE, oxyCODONE, polyethylene glycol, sodium chloride flush, sodium phosphate, tiZANidine, zolpidem  SUBJECTIVE:   24 hour events:  Vancomycin trough noted to be subtherapeutic with increase in dose per pharmacy No other acute events Afebrile No new labs this morning Culture from her OR also now growing gram-negative rods Identification and susceptibilities to follow Sensitivities of Staph aureus also pending Her blood cultures remain no growth  She has no new complaints this morning and is sitting up in bed eating breakfast.   Review of Systems  All other systems reviewed and are negative.     OBJECTIVE:   Blood pressure 125/66, pulse (!) 52, temperature 98 F (36.7 C), temperature source Axillary, resp. rate 18, height '5\' 2"'$  (1.575 m), weight 136 kg, SpO2 96 %. Body mass index is 54.84 kg/m.  Physical Exam Constitutional:      Appearance: Normal appearance.  HENT:     Head: Normocephalic and atraumatic.  Eyes:     Extraocular Movements: Extraocular movements intact.     Conjunctiva/sclera: Conjunctivae normal.  Pulmonary:     Effort: Pulmonary effort is normal. No respiratory distress.  Abdominal:     General: There is no distension.     Palpations: Abdomen is soft.  Musculoskeletal:     Cervical back: Normal range of motion and neck supple.  Neurological:  General: No focal deficit present.     Mental Status: She is alert and oriented to person, place, and time.     Cranial Nerves: No cranial nerve deficit.  Psychiatric:        Mood and Affect: Mood normal.        Behavior: Behavior normal.      Lab Results: Lab Results  Component Value Date   WBC 7.6 09/23/2022   HGB 9.7 (L) 09/23/2022   HCT 30.4 (L) 09/23/2022   MCV 95.0 09/23/2022   PLT  284 09/23/2022    Lab Results  Component Value Date   NA 138 09/23/2022   K 3.6 09/23/2022   CO2 22 09/23/2022   GLUCOSE 109 (H) 09/23/2022   BUN 10 09/23/2022   CREATININE 0.66 09/23/2022   CALCIUM 8.8 (L) 09/23/2022   GFRNONAA >60 09/23/2022   GFRAA >60 08/31/2019    Lab Results  Component Value Date   ALT 29 08/02/2022   AST 36 08/02/2022   ALKPHOS 87 08/02/2022   BILITOT 0.5 08/02/2022       Component Value Date/Time   CRP 2.7 (H) 09/21/2022 1529       Component Value Date/Time   ESRSEDRATE 76 (H) 09/21/2022 1529     I have reviewed the micro and lab results in Epic.  Imaging: No results found.   Imaging independently reviewed in Epic.    [name] for Infectious [facility] [phone] pager 09/26/2022, 7:58 AM

## 2022-09-26 NOTE — Progress Notes (Signed)
At bedside for PICC placement. Attempted x 4 via 2 PICC RN's on right arm with no success. Unable to thread the PICC past the shoulder region. Will report to MD. Recommend IR for PICC placement. RN aware.

## 2022-09-27 ENCOUNTER — Inpatient Hospital Stay (HOSPITAL_COMMUNITY): Payer: 59

## 2022-09-27 DIAGNOSIS — A4901 Methicillin susceptible Staphylococcus aureus infection, unspecified site: Secondary | ICD-10-CM | POA: Diagnosis not present

## 2022-09-27 DIAGNOSIS — Z6841 Body Mass Index (BMI) 40.0 and over, adult: Secondary | ICD-10-CM | POA: Diagnosis not present

## 2022-09-27 DIAGNOSIS — T847XXD Infection and inflammatory reaction due to other internal orthopedic prosthetic devices, implants and grafts, subsequent encounter: Secondary | ICD-10-CM | POA: Diagnosis not present

## 2022-09-27 LAB — GLUCOSE, CAPILLARY
Glucose-Capillary: 183 mg/dL — ABNORMAL HIGH (ref 70–99)
Glucose-Capillary: 161 mg/dL — ABNORMAL HIGH (ref 70–99)
Glucose-Capillary: 176 mg/dL — ABNORMAL HIGH (ref 70–99)

## 2022-09-27 MED ORDER — LIDOCAINE HCL 1 % IJ SOLN
INTRAMUSCULAR | Status: AC
  Administered 2022-09-27: 10 mL
  Filled 2022-09-27: qty 20

## 2022-09-27 MED ORDER — CHLORHEXIDINE GLUCONATE CLOTH 2 % EX PADS
6.0000 | MEDICATED_PAD | Freq: Every day | CUTANEOUS | Status: DC
  Administered 2022-09-27 – 2022-10-01 (×5): 6 via TOPICAL

## 2022-09-27 NOTE — Evaluation (Signed)
Occupational Therapy Evaluation Patient Details Name: Nancy Simmons MRN: [id number] DOB: 11/23/1955 Today's Date: 09/27/2022   History of Present Illness 67 year old female underwent an L5-S1 PLIF by Dr. Arnoldo Morale on 09/10/2022 and was discharged to [place] for continued rehabilitation. Presented to ED 09/21/22 with history of lumbar wound bleeding since 2/27. 3/4 I&D of lumbar wound; PMH asthma, autoimmune hepatitis, COPD, CAD (on Eliquis), GERD, high cholesterol, hypertension, sleep apnea, and type II diabetes   Clinical Impression   Patient evaluated by Occupational Therapy with no further acute OT needs identified. All education has been completed and the patient has no further questions. Plan is to return to SNF tomorrow to continue working with therapy prior to returning home. Prior to back surgeries, pt reports being Mod I with all ADL tasks and functional mobility. Currently limited primarily by pain requiring her to move and complete daily tasks at a slow rate. OT is signing off. Thank you for this referral.       Recommendations for follow up therapy are one component of a multi-disciplinary discharge planning process, led by the attending physician.  Recommendations may be updated based on patient status, additional functional criteria and insurance authorization.   Follow Up Recommendations  Skilled nursing-short term rehab (<3 hours/day)     Assistance Recommended at Discharge Intermittent Supervision/Assistance  Patient can return home with the following A little help with walking and/or transfers;A lot of help with bathing/dressing/bathroom;Assistance with cooking/housework;Help with stairs or ramp for entrance;Assist for transportation    Functional Status Assessment  Patient has had a recent decline in their functional status and demonstrates the ability to make significant improvements in function in a reasonable and predictable amount of time.  Equipment  Recommendations  None recommended by OT       Precautions / Restrictions Precautions Precautions: Back;Fall Precaution Booklet Issued: No Precaution Comments: pt can state and adhere to 3/3 back precautions Required Braces or Orthoses: Spinal Brace Spinal Brace: Lumbar corset;Applied in standing position Restrictions Weight Bearing Restrictions: No Other Position/Activity Restrictions: brace donned in stand due to body habitus      Mobility Bed Mobility Overal bed mobility: Needs Assistance Bed Mobility: Sit to Sidelying Rolling: Supervision Sidelying to sit: Min guard         Patient Response: Cooperative  Transfers Overall transfer level: Needs assistance Equipment used: Rolling walker (2 wheels) Transfers: Sit to/from Stand, Bed to chair/wheelchair/BSC Sit to Stand: Supervision     Step pivot transfers: Supervision     General transfer comment: no VC needed for hand placement. no reports of dizziness.      Balance Overall balance assessment: Mild deficits observed, not formally tested       ADL either performed or assessed with clinical judgement   ADL Overall ADL's : Needs assistance/impaired     Grooming: Wash/dry hands;Wash/dry face;Oral care;Standing;Supervision/safety   Upper Body Bathing: Supervision/ safety;Sitting   Lower Body Bathing: Total assistance;Sit to/from stand;Sitting/lateral leans;Cueing for back precautions   Upper Body Dressing : Supervision/safety;Sitting   Lower Body Dressing: Total assistance;Sit to/from stand   Toilet Transfer: Supervision/safety;Ambulation;BSC/3in1;Rolling walker (2 wheels)   Toileting- Clothing Manipulation and Hygiene: Minimal assistance;Sitting/lateral lean;Sit to/from stand       Vision Baseline Vision/History: 1 Wears glasses Ability to See in Adequate Light: 0 Adequate Patient Visual Report: No change from baseline Vision Assessment?: No apparent visual deficits            Pertinent  Vitals/Pain Pain Assessment Pain Assessment: 0-10  Pain Score: 9  Pain Descriptors / Indicators: Discomfort Pain Intervention(s): RN gave pain meds during session     Hand Dominance Right   Extremity/Trunk Assessment Upper Extremity Assessment Upper Extremity Assessment: Overall WFL for tasks assessed   Lower Extremity Assessment Lower Extremity Assessment: Defer to PT evaluation   Cervical / Trunk Assessment Cervical / Trunk Assessment: Back Surgery   Communication Communication Communication: No difficulties   Cognition Arousal/Alertness: Awake/alert Behavior During Therapy: WFL for tasks assessed/performed Overall Cognitive Status: Within Functional Limits for tasks assessed                    Home Living Family/patient expects to be discharged to:: Skilled nursing facility Living Arrangements: Alone   Type of Home: Apartment Home Access: Level entry     Home Layout: One level     Bathroom Shower/Tub: Teacher, early years/pre: Standard     Home Equipment: Rollator (4 wheels);Cane - single point;BSC/3in1;Tub bench;Hospital bed;Grab bars - toilet;Adaptive equipment Adaptive Equipment: Reacher;Sock aid        Prior Functioning/Environment Prior Level of Function : Independent/Modified Independent     Mobility Comments: Uses a rollator prior to back surgery ADLs Comments: independent as tolerated with pain        OT Problem List: Decreased strength;Decreased activity tolerance;Impaired balance (sitting and/or standing);Decreased knowledge of use of DME or AE;Decreased knowledge of precautions;Pain;Obesity      OT Treatment/Interventions:   Eval only   OT Goals(Current goals can be found in the care plan section) Acute Rehab OT Goals Patient Stated Goal: go back to SNF for rehab  OT Frequency:  1 time visit       AM-PAC OT "6 Clicks" Daily Activity     Outcome Measure Help from another person eating meals?: None Help from another  person taking care of personal grooming?: A Little Help from another person toileting, which includes using toliet, bedpan, or urinal?: A Little Help from another person bathing (including washing, rinsing, drying)?: Total Help from another person to put on and taking off regular upper body clothing?: A Little Help from another person to put on and taking off regular lower body clothing?: Total 6 Click Score: 15   End of Session Equipment Utilized During Treatment: Rolling walker (2 wheels);Back brace  Activity Tolerance: Patient tolerated treatment well Patient left: in bed;with call bell/phone within reach;with bed alarm set  OT Visit Diagnosis: Other abnormalities of gait and mobility (R26.89);Muscle weakness (generalized) (M62.81);Pain Pain - part of body:  (back)                Time: 1520-1535 OT Time Calculation (min): 15 min Charges:  OT General Charges $OT Visit: 1 Visit OT Evaluation $OT Eval Low Complexity: 1 Low Ailene Ravel, OTR/L,CBIS  Supplemental OT - [facility] and [facility] Secure Chat Preferred    Brixton Franko, Clarene Duke 09/27/2022, 4:41 PM

## 2022-09-27 NOTE — Progress Notes (Signed)
Nancy Simmons for Infectious Disease  Date of Admission:  09/21/2022           Reason for visit: Follow up on lumbar wound infection with hardware   Current antibiotics: Vancomycin Cefepime    ASSESSMENT:    67 y.o. female admitted with:  Polymicrobial hardware complicating wound infection: Patient's status post prior L4-L5 instrumented fusion with recent extension of this fusion to L5-S1 on 0000000 complicated by surgical site incisional drainage.  Taken to the OR for I&D 09/24/2022 where purulent material was encountered including deep to the hardware.  Operative cultures are growing MSSA and PsA. Diabetes: Hemoglobin A1c earlier this year 6.3. Obesity: Body mass index is 54.84 kg/m.   RECOMMENDATIONS:    Stop vancomycin Continue cefepime for MSSA and PsA See OPAT Will sign off, please call as needed  Diagnosis: Hardware complicating wound infection  Culture Result: MSSA and Pseudomonas  Allergies  Allergen Reactions   Acetaminophen Other (See Comments)    PATIENT HAS AUTOIMMUNE HEPATITIS PATIENT IS * NOT * TO RECEIVE ANY ACETAMINOPHEN   Penicillin G Rash and Other (See Comments)    Has patient had a PCN reaction causing immediate rash, facial/tongue/throat swelling, SOB or lightheadedness with hypotension: No Has patient had a PCN reaction causing severe rash involving mucus membranes or skin necrosis: No Has patient had a PCN reaction that required hospitalization: No Has patient had a PCN reaction occurring within the last 10 years: #  #  #  YES  #  #  #     OPAT Orders Discharge antibiotics to be given via PICC line Discharge antibiotics: Per pharmacy protocol  Cefepime  Duration: 6 weeks  End Date: 11/05/2022  [facility] Care Per Protocol:  Home health RN for IV administration and teaching; PICC line care and labs.    Labs weekly while on IV antibiotics: _xxx_ CBC with differential _xxx_ BMP __ CMP _xxx_ CRP _xxx_ ESR __ Vancomycin trough __  CK  _xxx_ Please pull PIC at completion of IV antibiotics __ Please leave PIC in place until doctor has seen patient or been notified  Fax weekly labs to [phone]  Clinic Follow Up Appt: 10/23/22 at 11:15 with Juleen China    Principal Problem:   Postoperative complication of skin involving drainage from surgical wound Active Problems:   Wound infection complicating hardware ([place])   Staph aureus infection   Class 3 severe obesity due to excess calories with serious comorbidity and body mass index (BMI) of 50.0 to 59.9 in adult [facility])    MEDICATIONS:    Scheduled Meds:  amLODipine  5 mg Oral Daily   docusate sodium  100 mg Oral BID   escitalopram  10 mg Oral Daily   fluticasone  2 spray Each Nare Daily   hydrALAZINE  10 mg Oral QID   insulin aspart  0-20 Units Subcutaneous TID WC   linaclotide  145 mcg Oral QAC breakfast   metoprolol tartrate  50 mg Oral BID   pantoprazole  40 mg Oral Daily   pregabalin  100 mg Oral TID   rOPINIRole  1 mg Oral QHS   rosuvastatin  5 mg Oral Daily   sodium chloride flush  3 mL Intravenous Q12H   umeclidinium-vilanterol  1 puff Inhalation Daily   Continuous Infusions:  sodium chloride     ceFEPime (MAXIPIME) IV 2 g (09/27/22 0013)   vancomycin 1,000 mg (09/27/22 0442)   PRN Meds:.albuterol, albuterol, bisacodyl, cyclobenzaprine, diclofenac Sodium, HYDROmorphone (DILAUDID)  injection, meclizine, menthol-cetylpyridinium **OR** phenol, metoprolol tartrate, morphine injection, ondansetron **OR** ondansetron (ZOFRAN) IV, oxyCODONE, oxyCODONE, polyethylene glycol, sodium chloride flush, sodium phosphate, tiZANidine, zolpidem  SUBJECTIVE:   24 hour events:  No acute events noted PICC line was placed by IR this morning in the left brachial vein OR cultures with MSSA and Pseudomonas  She has no new complaints  Review of Systems  All other systems reviewed and are negative.     OBJECTIVE:   Blood pressure 126/64, pulse (!) 58, temperature  99.5 F (37.5 C), temperature source Axillary, resp. rate 17, height '5\' 2"'$  (1.575 m), weight 136 kg, SpO2 96 %. Body mass index is 54.84 kg/m.  Physical Exam Constitutional:      Appearance: Normal appearance.  Eyes:     Extraocular Movements: Extraocular movements intact.     Conjunctiva/sclera: Conjunctivae normal.  Musculoskeletal:     Cervical back: Normal range of motion and neck supple.  Skin:    General: Skin is warm and dry.  Neurological:     General: No focal deficit present.     Mental Status: She is alert and oriented to person, place, and time.  Psychiatric:        Mood and Affect: Mood normal.        Behavior: Behavior normal.      Lab Results: Lab Results  Component Value Date   WBC 7.6 09/23/2022   HGB 9.7 (L) 09/23/2022   HCT 30.4 (L) 09/23/2022   MCV 95.0 09/23/2022   PLT 284 09/23/2022    Lab Results  Component Value Date   NA 138 09/23/2022   K 3.6 09/23/2022   CO2 22 09/23/2022   GLUCOSE 109 (H) 09/23/2022   BUN 10 09/23/2022   CREATININE 0.66 09/23/2022   CALCIUM 8.8 (L) 09/23/2022   GFRNONAA >60 09/23/2022   GFRAA >60 08/31/2019    Lab Results  Component Value Date   ALT 29 08/02/2022   AST 36 08/02/2022   ALKPHOS 87 08/02/2022   BILITOT 0.5 08/02/2022       Component Value Date/Time   CRP 2.7 (H) 09/21/2022 1529       Component Value Date/Time   ESRSEDRATE 76 (H) 09/21/2022 1529     I have reviewed the micro and lab results in Epic.  Imaging: [place] EKG SITE RITE  Result Date: 09/26/2022 If Site Rite image not attached, placement could not be confirmed due to current cardiac rhythm.    Imaging independently reviewed in Epic.    [name] for Infectious Disease [facility] [phone] pager 09/27/2022, 6:06 AM  I have personally spent 35 minutes involved in face-to-face and non-face-to-face activities for this patient on the day of the visit. Professional time spent includes the  following activities: Preparing to see the patient (review of tests), Obtaining and/or reviewing separately obtained history (admission/discharge record), Performing a medically appropriate examination and/or evaluation , Ordering medications/tests/procedures, referring and communicating with other health care professionals, Documenting clinical information in the EMR, Independently interpreting results (not separately reported), Communicating results to the patient/family/caregiver, Counseling and educating the patient/family/caregiver and Care coordination (not separately reported).

## 2022-09-27 NOTE — Progress Notes (Signed)
Providing Compassionate, Quality Care - Together   Subjective: Patient reports no new issues. PICC line placed this morning.  Objective: Vital signs in last 24 hours: Temp:  [97.5 F (36.4 C)-99.5 F (37.5 C)] 99.1 F (37.3 C) (03/07 0747) Pulse Rate:  [55-62] 58 (03/07 0909) Resp:  [13-18] 18 (03/07 0909) BP: (119-136)/(54-64) 128/64 (03/07 0909) SpO2:  [94 %-97 %] 94 % (03/07 0909)  Intake/Output from previous day: 03/06 0701 - 03/07 0700 In: 1063.2 [P.O.:240; IV Piggyback:823.2] Out: 1600 [Urine:1600] Intake/Output this shift: Total I/O In: -  Out: 700 [Urine:700]    Alert and oriented x 4 PERRLA CN II-XII grossly intact MAE, Strength and sensation intact  Lab Results: No results for input(s): "WBC", "HGB", "HCT", "PLT" in the last 72 hours. BMET No results for input(s): "NA", "K", "CL", "CO2", "GLUCOSE", "BUN", "CREATININE", "CALCIUM" in the last 72 hours.  Studies/Results: IR PICC PLACEMENT LEFT >5 YRS INC IMG GUIDE  Result Date: 09/27/2022 INDICATION: Central venous access for IV therapies. PICC team unable to place on floor due to presumed venous occlusion versus stenosis on the right. EXAM: ULTRASOUND AND FLUOROSCOPIC GUIDED PICC LINE INSERTION MEDICATIONS: 1% lidocaine 3 mL CONTRAST:  None FLUOROSCOPY: Radiation Exposure Index (as provided by the fluoroscopic device): 4 mGy Kerma COMPLICATIONS: None immediate. TECHNIQUE: The procedure, risks, benefits, and alternatives were explained to the patient and informed written consent was obtained. The left upper extremity was prepped with chlorhexidine in a sterile fashion, and a sterile drape was applied covering the operative field. Maximum barrier sterile technique with sterile gowns and gloves were used for the procedure. A timeout was performed prior to the initiation of the procedure. Local anesthesia was provided with 1% lidocaine. After the overlying soft tissues were anesthetized with 1% lidocaine, a  micropuncture kit was utilized to access the left brachial vein. Real-time ultrasound guidance was utilized for vascular access including the acquisition of a permanent ultrasound image documenting patency of the accessed vessel. A guidewire was advanced to the level of the superior caval-atrial junction for measurement purposes and the PICC line was cut at an angle to length. A peel-away sheath was placed and a 49 cm, 5 [place], dual lumen was inserted to level of the superior caval-atrial junction. A post procedure spot fluoroscopic was obtained. The catheter easily aspirated and flushed and was secured in place. A dressing was placed. The patient tolerated the procedure well without immediate post procedural complication. FINDINGS: After catheter placement, the tip lies within the superior cavoatrial junction. The catheter aspirates and flushes normally and is ready for immediate use. IMPRESSION: Successful ultrasound and fluoroscopic guided placement of a left basilic vein approach, 49 cm, 5 French, dual lumen PICC with tip at the superior caval-atrial junction. The PICC line is ready for immediate use. Procedure performed by: Gareth Eagle, PA-C Electronically Signed   By: Ruthann Cancer M.D.   On: 09/27/2022 09:36   [place] EKG SITE RITE  Result Date: 09/26/2022 If Site Rite image not attached, placement could not be confirmed due to current cardiac rhythm.   Assessment/Plan: The patient underwent an L5-S1 PLIF by Dr. Arnoldo Morale on 09/10/2022. She was discharged to [place] for continued rehabilitation. She was readmitted to [facility] on 09/21/2022 with copious wound drainage. She was taken back to the OR on 09/24/2022 for a wound washout. Her wound culture grew few Staph aureus and rare Pseudomonas aeruginosa. The patient will need IV antibiotics. PICC line was attempted at the bedside without success  on 09/26/2022. IR placed PICC line this morning.    LOS: 6 days   -Plan is to discharge  patient back to SNF hopefully tomorrow. -Encourage mobilization.   Viona Gilmore, DNP, AGNP-C Nurse Practitioner  [facility] [place] [address], [address], [place], [place] [address] P: [phone]    F: [phone]  09/27/2022, 11:22 AM

## 2022-09-27 NOTE — Procedures (Signed)
PROCEDURE SUMMARY:  Successful placement of dual lumen PICC line to left brachial vein. Length 49cm Tip at lower SVC/RA No complications PICC capped Ready for use. EBL = trace  Please see full dictation in Imaging section for details.  *PICC team attempted right sided placement on the floor = unable to cross wire past axillary vein = presume there is a venous occlusion/stenosis on the right*   [facility] S Januel Doolan PA-C 09/27/2022 8:44 AM

## 2022-09-27 NOTE — Care Management Important Message (Signed)
Important Message  Patient Details  Name: Nancy Simmons MRN: [id number] Date of Birth: 1956-03-20   Medicare Important Message Given:  Yes     Hannah Beat 09/27/2022, 10:53 AM

## 2022-09-27 NOTE — Progress Notes (Signed)
Pt is off the unit to IR.

## 2022-09-27 NOTE — Progress Notes (Signed)
Physical Therapy Treatment Patient Details Name: Nancy Simmons MRN: [id number] DOB: 1955-08-14 Today's Date: 09/27/2022   History of Present Illness 67 year old female underwent an L5-S1 PLIF by Dr. Arnoldo Morale on 09/10/2022 and was discharged to [place] for continued rehabilitation. Presented to ED 09/21/22 with history of lumbar wound bleeding since 2/27. 3/4 I&D of lumbar wound; PMH asthma, autoimmune hepatitis, COPD, CAD (on Eliquis), GERD, high cholesterol, hypertension, sleep apnea, and type II diabetes    PT Comments    Patient reports sitting up for hours prior to PT arrival. Reports pain 9/10 and doesn't remember when she last had pain medication. Called RN for pain medication and she was in by end of session to administer. Discussed need to "stay on top of her pain" and take pain medication regularly so she does not get to a 9/10. Patient willing to work with PT and was able to walk 150 ft with RW with slow gait with wide BOS. Returned to room and OT arrived to work with patient. She remains very motivated to regain her independence and return to living alone.     Recommendations for follow up therapy are one component of a multi-disciplinary discharge planning process, led by the attending physician.  Recommendations may be updated based on patient status, additional functional criteria and insurance authorization.  Follow Up Recommendations  Skilled nursing-short term rehab (<3 hours/day) Can patient physically be transported by private vehicle: Yes   Assistance Recommended at Discharge PRN  Patient can return home with the following Assistance with cooking/housework;Assist for transportation;Help with stairs or ramp for entrance   Equipment Recommendations  Other (comment) (TBD)    Recommendations for Other Services       Precautions / Restrictions Precautions Precautions: Back;Fall Precaution Booklet Issued: No Precaution Comments: pt can state and adhere  to 3/3 back precautions Required Braces or Orthoses: Spinal Brace Spinal Brace: Lumbar corset;Applied in standing position (due to body habitus) Restrictions Weight Bearing Restrictions: No     Mobility  Bed Mobility               General bed mobility comments: up on BSC on arrival; with OT at end of session    Transfers Overall transfer level: Needs assistance Equipment used: Rolling walker (2 wheels) Transfers: Sit to/from Stand Sit to Stand: Min guard           General transfer comment: proper hand placement; denied dizziness; repeated x2 (BSC and recliner)    Ambulation/Gait Ambulation/Gait assistance: Min guard Gait Distance (Feet): 150 Feet Assistive device: Rolling walker (2 wheels) Gait Pattern/deviations: Step-through pattern, Decreased step length - left, Wide base of support Gait velocity: decreased Gait velocity interpretation: 1.31 - 2.62 ft/sec, indicative of limited community ambulator   General Gait Details: vc for proximity to RW (pt reports accustomed to using a rollator); steady gait with RW. Fatigue  noted at end of amb.   Stairs             Wheelchair Mobility    Modified Rankin (Stroke Patients Only)       Balance Overall balance assessment: Mild deficits observed, not formally tested Sitting-balance support: No upper extremity supported, Feet supported Sitting balance-Leahy Scale: Good     Standing balance support: During functional activity, No upper extremity supported Standing balance-Leahy Scale: Fair Standing balance comment: able to assist with donning brace in standing without imbalance and no UE support  Cognition Arousal/Alertness: Awake/alert Behavior During Therapy: WFL for tasks assessed/performed Overall Cognitive Status: No family/caregiver present to determine baseline cognitive functioning                                 General Comments: oriented;  follows all commands; appears [facility] but not specifically tested        Exercises      General Comments        Pertinent Vitals/Pain Pain Assessment Pain Assessment: 0-10 Pain Score: 9  Pain Location: back Pain Descriptors / Indicators: Discomfort Pain Intervention(s): Limited activity within patient's tolerance, Monitored during session, Patient requesting pain meds-RN notified    Home Living                          Prior Function            PT Goals (current goals can now be found in the care plan section) Acute Rehab PT Goals Patient Stated Goal: rehab then home Time For Goal Achievement: 10/09/22 Potential to Achieve Goals: Good Progress towards PT goals: Progressing toward goals    Frequency    Min 3X/week      PT Plan Current plan remains appropriate;Frequency needs to be updated    Co-evaluation              AM-PAC PT "6 Clicks" Mobility   Outcome Measure  Help needed turning from your back to your side while in a flat bed without using bedrails?: A Little Help needed moving from lying on your back to sitting on the side of a flat bed without using bedrails?: A Little Help needed moving to and from a bed to a chair (including a wheelchair)?: A Little Help needed standing up from a chair using your arms (e.g., wheelchair or bedside chair)?: A Little Help needed to walk in hospital room?: A Little Help needed climbing 3-5 steps with a railing? : Total 6 Click Score: 16    End of Session Equipment Utilized During Treatment: Back brace Activity Tolerance: Patient limited by pain Patient left: with call bell/phone within reach;in bed;Other (comment) (sitting EOB about to work with OT) Nurse Communication: Patient requests pain meds PT Visit Diagnosis: Other abnormalities of gait and mobility (R26.89);Pain Pain - Right/Left:  (midline) Pain - part of body:  (back)     Time: [id number] PT Time Calculation (min) (ACUTE ONLY): 22  min  Charges:  $Gait Training: 8-22 mins                      [place]  Office [phone]    Rexanne Mano 09/27/2022, 3:27 PM

## 2022-09-27 NOTE — Progress Notes (Signed)
PHARMACY CONSULT NOTE FOR:  OUTPATIENT  PARENTERAL ANTIBIOTIC THERAPY (OPAT)  Indication: Lumbar wound infection with hardware Regimen: Cefepime 2 gm IV Q 8 hours   End date: 11/05/22  IV antibiotic discharge orders are pended. To discharging provider:  please sign these orders via discharge navigator,  Select New Orders & click on the button choice - Manage This Unsigned Work.     Thank you for allowing pharmacy to be a part of this patient's care.  Jimmy Footman, PharmD, BCPS, [facility] Clinical Pharmacist Phone: [phone] 09/27/2022, 11:23 AM

## 2022-09-28 LAB — GLUCOSE, CAPILLARY
Glucose-Capillary: 122 mg/dL — ABNORMAL HIGH (ref 70–99)
Glucose-Capillary: 151 mg/dL — ABNORMAL HIGH (ref 70–99)
Glucose-Capillary: 104 mg/dL — ABNORMAL HIGH (ref 70–99)
Glucose-Capillary: 108 mg/dL — ABNORMAL HIGH (ref 70–99)
Glucose-Capillary: 138 mg/dL — ABNORMAL HIGH (ref 70–99)
Glucose-Capillary: 169 mg/dL — ABNORMAL HIGH (ref 70–99)
Glucose-Capillary: 179 mg/dL — ABNORMAL HIGH (ref 70–99)
Glucose-Capillary: 122 mg/dL — ABNORMAL HIGH (ref 70–99)
Glucose-Capillary: 162 mg/dL — ABNORMAL HIGH (ref 70–99)
Glucose-Capillary: 139 mg/dL — ABNORMAL HIGH (ref 70–99)
Glucose-Capillary: 131 mg/dL — ABNORMAL HIGH (ref 70–99)
Glucose-Capillary: 142 mg/dL — ABNORMAL HIGH (ref 70–99)
Glucose-Capillary: 131 mg/dL — ABNORMAL HIGH (ref 70–99)
Glucose-Capillary: 119 mg/dL — ABNORMAL HIGH (ref 70–99)
Glucose-Capillary: 172 mg/dL — ABNORMAL HIGH (ref 70–99)
Glucose-Capillary: 172 mg/dL — ABNORMAL HIGH (ref 70–99)

## 2022-09-28 MED ORDER — SODIUM CHLORIDE 0.9 % IV SOLN: 1.0000 g | INTRAVENOUS | Status: DC

## 2022-09-28 MED ORDER — OXYCODONE HCL 10 MG PO TABS: 10.0000 mg | ORAL_TABLET | ORAL | 0 refills | Status: AC | PRN

## 2022-09-28 MED ORDER — PREGABALIN 100 MG PO CAPS: 100.0000 mg | ORAL_CAPSULE | Freq: Three times a day (TID) | ORAL | 0 refills | Status: AC

## 2022-09-28 MED ORDER — CYCLOBENZAPRINE HCL 10 MG PO TABS: 10.0000 mg | ORAL_TABLET | Freq: Three times a day (TID) | ORAL | 0 refills | Status: DC | PRN

## 2022-09-28 MED ORDER — DOCUSATE SODIUM 100 MG PO CAPS: 100.0000 mg | ORAL_CAPSULE | Freq: Two times a day (BID) | ORAL | 0 refills | Status: DC

## 2022-09-28 MED ORDER — CEFEPIME IV (FOR PTA / DISCHARGE USE ONLY): 2.0000 g | Freq: Three times a day (TID) | INTRAVENOUS | 0 refills | Status: DC

## 2022-09-28 MED FILL — Sodium Chloride IV Soln 0.9%: INTRAVENOUS | Qty: 1000 | Status: AC

## 2022-09-28 MED FILL — Heparin Sodium (Porcine) Inj 1000 Unit/ML: INTRAMUSCULAR | Qty: 30 | Status: AC

## 2022-09-28 NOTE — Progress Notes (Signed)
Providing Compassionate, Quality Care - Together   Subjective: Patient reports no new issues. She is resting comfortably in bed.  Objective: Vital signs in last 24 hours: Temp:  [98.8 F (37.1 C)-99.8 F (37.7 C)] 98.8 F (37.1 C) (03/08 1120) Pulse Rate:  [58-88] 58 (03/08 1120) Resp:  [17-18] 18 (03/08 1120) BP: (125-146)/(63-74) 132/65 (03/08 1120) SpO2:  [94 %-97 %] 94 % (03/08 1120)  Intake/Output from previous day: 03/07 0701 - 03/08 0700 In: 465.8 [P.O.:360; I.V.:5.8; IV Piggyback:100] Out: 1600 [Urine:1600] Intake/Output this shift: No intake/output data recorded.  Alert and oriented x 4 PERRLA CN II-XII grossly intact MAE, Strength and sensation intact  Lab Results: No results for input(s): "WBC", "HGB", "HCT", "PLT" in the last 72 hours. BMET No results for input(s): "NA", "K", "CL", "CO2", "GLUCOSE", "BUN", "CREATININE", "CALCIUM" in the last 72 hours.  Studies/Results: IR PICC PLACEMENT LEFT >5 YRS INC IMG GUIDE  Result Date: 09/27/2022 INDICATION: Central venous access for IV therapies. PICC team unable to place on floor due to presumed venous occlusion versus stenosis on the right. EXAM: ULTRASOUND AND FLUOROSCOPIC GUIDED PICC LINE INSERTION MEDICATIONS: 1% lidocaine 3 mL CONTRAST:  None FLUOROSCOPY: Radiation Exposure Index (as provided by the fluoroscopic device): 4 mGy Kerma COMPLICATIONS: None immediate. TECHNIQUE: The procedure, risks, benefits, and alternatives were explained to the patient and informed written consent was obtained. The left upper extremity was prepped with chlorhexidine in a sterile fashion, and a sterile drape was applied covering the operative field. Maximum barrier sterile technique with sterile gowns and gloves were used for the procedure. A timeout was performed prior to the initiation of the procedure. Local anesthesia was provided with 1% lidocaine. After the overlying soft tissues were anesthetized with 1% lidocaine, a  micropuncture kit was utilized to access the left brachial vein. Real-time ultrasound guidance was utilized for vascular access including the acquisition of a permanent ultrasound image documenting patency of the accessed vessel. A guidewire was advanced to the level of the superior caval-atrial junction for measurement purposes and the PICC line was cut at an angle to length. A peel-away sheath was placed and a 49 cm, 5 [place], dual lumen was inserted to level of the superior caval-atrial junction. A post procedure spot fluoroscopic was obtained. The catheter easily aspirated and flushed and was secured in place. A dressing was placed. The patient tolerated the procedure well without immediate post procedural complication. FINDINGS: After catheter placement, the tip lies within the superior cavoatrial junction. The catheter aspirates and flushes normally and is ready for immediate use. IMPRESSION: Successful ultrasound and fluoroscopic guided placement of a left basilic vein approach, 49 cm, 5 French, dual lumen PICC with tip at the superior caval-atrial junction. The PICC line is ready for immediate use. Procedure performed by: Gareth Eagle, PA-C Electronically Signed   By: Ruthann Cancer M.D.   On: 09/27/2022 09:36    Assessment/Plan: The patient underwent an L5-S1 PLIF by Dr. Arnoldo Morale on 09/10/2022. She was discharged to [facility] for continued rehabilitation. She was readmitted to [facility] on 09/21/2022 with copious wound drainage. She was taken back to the OR on 09/24/2022 for a wound washout. Her wound culture grew few Staph aureus and rare Pseudomonas aeruginosa. The patient will need IV antibiotics. PICC line was attempted at the bedside without success on 09/26/2022. IR placed PICC 09/27/2022.   LOS: 7 days   -Awaiting insurance authorization for SNF. -Encourage mobilization.   Viona Gilmore, DNP, AGNP-C Nurse Practitioner  [facility] [address], [place], [place] [address] P: [phone]    F: [phone]  09/28/2022, 1:56 PM

## 2022-09-28 NOTE — Progress Notes (Signed)
Mobility Specialist Progress Note   09/28/22 1530  Mobility  Activity Ambulated with assistance in hallway  Level of Assistance Standby assist, set-up cues, supervision of patient - no hands on  Assistive Device Front wheel walker  Distance Ambulated (ft) 150 ft  Range of Motion/Exercises Active;All extremities  Activity Response Tolerated well   Patient received in recliner and eager to participate in mobility. Stood independently and donned brace with assistance. Ambulated supervision level with heavy UE reliance and slow steady gait. Returned to room without complaint or incident. Was left in recliner with all needs met, call bell in reach.   [place] Wilson Sample, BS EXP Mobility Specialist Please contact via SecureChat or Rehab office at [phone]

## 2022-09-28 NOTE — Discharge Instructions (Signed)
Wound Care Keep incision covered and dry with a sterile gauze dressing. Sutures will need to be removed at the office in 2 weeks. Do not put any creams, lotions, or ointments on incision. You are fine to shower. Let water run over incision and pat dry.  Activity Walk each and every day, increasing distance each day. No lifting greater than 5 lbs.  Avoid excessive back motion. No driving for 2 weeks; may ride as a passenger locally.  Diet Resume your normal diet.  Call Your Doctor If Any of These Occur Redness, drainage, or swelling at the wound.  Temperature greater than 101 degrees. Severe pain not relieved by pain medication. Incision starts to come apart.  Follow Up Appt Call [phone] today for appointment in 2 weeks if you don't already have one or for any problems.  If you have any hardware placed in your spine, you will need an x-ray before your appointment.

## 2022-09-28 NOTE — TOC Progression Note (Signed)
Transition of Care [facility]) - Progression Note    Patient Details  Name: Nancy Simmons MRN: [id number] Date of Birth: 06-22-1956  Transition of Care [facility]) CM/SW [place], [place] Phone Number: 09/28/2022, 10:34 AM  Clinical Narrative:      Insurance pending -  reference # [id number]  Expected Discharge Plan: Skilled Nursing Facility Barriers to Discharge: Continued Medical Work up  Expected Discharge Plan and Services In-house Referral: Clinical Social Work                                             Social Determinants of Health (SDOH) Interventions SDOH Screenings   Food Insecurity: No Food Insecurity (09/21/2022)  Housing: Low Risk  (09/21/2022)  Transportation Needs: No Transportation Needs (09/21/2022)  Utilities: Not At Risk (09/21/2022)  Tobacco Use: Medium Risk (09/25/2022)    Readmission Risk Interventions     No data to display

## 2022-09-29 LAB — AEROBIC/ANAEROBIC CULTURE W GRAM STAIN (SURGICAL/DEEP WOUND)

## 2022-09-29 LAB — GLUCOSE, CAPILLARY
Glucose-Capillary: 134 mg/dL — ABNORMAL HIGH (ref 70–99)
Glucose-Capillary: 143 mg/dL — ABNORMAL HIGH (ref 70–99)
Glucose-Capillary: 163 mg/dL — ABNORMAL HIGH (ref 70–99)
Glucose-Capillary: 153 mg/dL — ABNORMAL HIGH (ref 70–99)
Glucose-Capillary: 147 mg/dL — ABNORMAL HIGH (ref 70–99)

## 2022-09-29 MED ORDER — HEPARIN SODIUM (PORCINE) 5000 UNIT/ML IJ SOLN
5000.0000 [IU] | Freq: Three times a day (TID) | INTRAMUSCULAR | Status: DC
  Administered 2022-09-29 – 2022-10-01 (×7): 5000 [IU] via SUBCUTANEOUS
  Filled 2022-09-29 (×7): qty 1

## 2022-09-29 NOTE — Progress Notes (Signed)
Mobility Specialist Progress Note    09/29/22 1207  Mobility  Activity Ambulated with assistance in hallway  Level of Assistance Standby assist, set-up cues, supervision of patient - no hands on  Assistive Device Front wheel walker  Distance Ambulated (ft) 210 ft  Activity Response Tolerated well  Mobility Referral Yes  $Mobility charge 1 Mobility   Pt received in bed and agreeable. Pt minA to don lumbar corset in standing position. No complaints on walk. Returned to chair with call bell in reach.   Hildred Alamin Mobility Specialist  Please Psychologist, sport and exercise or Rehab Office at [phone]

## 2022-09-29 NOTE — Progress Notes (Signed)
Neurosurgery Service Progress Note  Subjective: No acute events overnight, in good spirits, back improving, no leg pain   Objective: Vitals:   09/28/22 2140 09/28/22 2310 09/29/22 0256 09/29/22 0751  BP: (!) 140/68 (!) 124/55 111/60 119/72  Pulse: 71 67 60 62  Resp:    18  Temp:  99.2 F (37.3 C) 99 F (37.2 C) 98.7 F (37.1 C)  TempSrc:  Axillary Axillary Axillary  SpO2: 97% 95% 95% 94%  Weight:      Height:        Physical Exam: Strength 5/5 x4 and SILTx4, incision c/d/I with dressing intact  Assessment & Plan: 67 y.o. woman s/p 5-1 PLIF with post-op wound infection / washout.  -PICC in place, on Abx -awaiting SNF placement -SCDs/TEDs/SQH  Judith Part  09/29/22 9:25 AM

## 2022-09-30 LAB — GLUCOSE, CAPILLARY
Glucose-Capillary: 126 mg/dL — ABNORMAL HIGH (ref 70–99)
Glucose-Capillary: 139 mg/dL — ABNORMAL HIGH (ref 70–99)
Glucose-Capillary: 179 mg/dL — ABNORMAL HIGH (ref 70–99)
Glucose-Capillary: 116 mg/dL — ABNORMAL HIGH (ref 70–99)
Glucose-Capillary: 137 mg/dL — ABNORMAL HIGH (ref 70–99)

## 2022-09-30 NOTE — Progress Notes (Signed)
Mobility Specialist Progress Note    09/30/22 1209  Mobility  Activity Ambulated with assistance to bathroom;Ambulated with assistance in room  Level of Assistance Standby assist, set-up cues, supervision of patient - no hands on  Assistive Device Front wheel walker  Distance Ambulated (ft) 30 ft (15+15)  Activity Response Tolerated well  Mobility Referral Yes  $Mobility charge 1 Mobility   Pt received in bed requesting to get up for breakfast. No complaints. Left in chair with call bell in reach.   Hildred Alamin Mobility Specialist  Please Psychologist, sport and exercise or Rehab Office at [phone]

## 2022-09-30 NOTE — Progress Notes (Signed)
Neurosurgery Service Progress Note  Subjective: No acute events overnight. Overall feeling well, back pain continues to improve, no leg pain.    Objective: Vitals:   09/29/22 1914 09/29/22 2303 09/30/22 0159 09/30/22 0800  BP: 139/70 (!) 144/68 122/72 (!) 140/71  Pulse: 68 68 (!) 58 62  Resp:    18  Temp: 98.4 F (36.9 C) 98.8 F (37.1 C) 98.9 F (37.2 C) 99.1 F (37.3 C)  TempSrc: Oral Oral Axillary Axillary  SpO2: 96% 91% 95% 91%  Weight:      Height:        Physical Exam: Strength 5/5 x4 and SILTx4, incision c/d/I with dressing intact   Assessment & Plan: 67 y.o. woman s/p 5-1 PLIF with post-op wound infection / washout.   -PICC in place, on Abx -awaiting SNF placement -SCDs/TEDs/SQH  Norm Parcel, PA-C 09/30/22 8:45 AM

## 2022-10-01 LAB — GLUCOSE, CAPILLARY
Glucose-Capillary: 138 mg/dL — ABNORMAL HIGH (ref 70–99)
Glucose-Capillary: 164 mg/dL — ABNORMAL HIGH (ref 70–99)

## 2022-10-01 NOTE — TOC Progression Note (Signed)
Transition of Care [facility]) - Progression Note    Patient Details  Name: Nancy Simmons MRN: [id number] Date of Birth: 12-Apr-1956  Transition of Care [facility]) CM/SW [place], [place] Phone Number: 10/01/2022, 11:38 AM  Clinical Narrative:     Patient has insurance approval 03/08-03/12   Secure chart sent to PA - informed of insurance auth  Expected Discharge Plan: [place] Barriers to Discharge: Barriers Resolved  Expected Discharge Plan and Services In-house Referral: Clinical Social Work                                             Social Determinants of Health (SDOH) Interventions [place]: No Food Insecurity (09/21/2022)  Housing: Low Risk  (09/21/2022)  Transportation Needs: No Transportation Needs (09/21/2022)  Utilities: Not At Risk (09/21/2022)  Tobacco Use: Medium Risk (09/25/2022)    Readmission Risk Interventions     No data to display

## 2022-10-01 NOTE — Progress Notes (Signed)
Physical Therapy Treatment Patient Details Name: Nancy Simmons MRN: [id number] DOB: 18-Feb-1956 Today's Date: 10/01/2022   History of Present Illness 67 year old female underwent an L5-S1 PLIF by Dr. Arnoldo Morale on 09/10/2022 and was discharged to [facility] for continued rehabilitation. Presented to ED 09/21/22 with history of lumbar wound bleeding since 2/27. 3/4 I&D of lumbar wound; PMH asthma, autoimmune hepatitis, COPD, CAD (on Eliquis), GERD, high cholesterol, hypertension, sleep apnea, and type II diabetes    PT Comments    Patient eager to participate--wanting to get OOB and ambulate. Reports has bed at home with elevating HOB and rail. Allowed pt to use both features of hospital bed and able to perform bed mobility with supervision. After coming to sit, further discussed her bed rail and it is a full length bed rail. She will not be able to have it up to use and be able to exit bed on that side. Will need to practice OOB without rail to assist. Patient requires cues with ambulation with RW (upright posture, proximity to RW, and to lessen pressure through her UEs). +fatigue at end of walk.     Recommendations for follow up therapy are one component of a multi-disciplinary discharge planning process, led by the attending physician.  Recommendations may be updated based on patient status, additional functional criteria and insurance authorization.  Follow Up Recommendations  Skilled nursing-short term rehab (<3 hours/day) Can patient physically be transported by private vehicle: Yes   Assistance Recommended at Discharge PRN  Patient can return home with the following Assistance with cooking/housework;Assist for transportation;Help with stairs or ramp for entrance   Equipment Recommendations  Other (comment) (TBD)    Recommendations for Other Services       Precautions / Restrictions Precautions Precautions: Back;Fall Precaution Booklet Issued: No Precaution Comments:  pt can state and adhere to 3/3 back precautions Required Braces or Orthoses: Spinal Brace Spinal Brace: Lumbar corset;Applied in standing position (applied in standing due to body habitus) Restrictions Weight Bearing Restrictions: No     Mobility  Bed Mobility Overal bed mobility: Needs Assistance Bed Mobility: Rolling, Sidelying to Sit Rolling: Supervision (with rail) Sidelying to sit: Supervision, HOB elevated       General bed mobility comments: pt reported she has a bed rail at home and can elevate her HOB; once seated EOB and further discussed her rail, realized it is a full length rail and she cannot have it up to use and come to sit on that side of the bed--will need to practice without rail    Transfers Overall transfer level: Needs assistance Equipment used: Rolling walker (2 wheels) Transfers: Sit to/from Stand, Bed to chair/wheelchair/BSC Sit to Stand: Supervision           General transfer comment: no VC needed for hand placement. no reports of dizziness.    Ambulation/Gait Ambulation/Gait assistance: Min guard Gait Distance (Feet): 150 Feet Assistive device: Rolling walker (2 wheels) Gait Pattern/deviations: Step-through pattern, Wide base of support Gait velocity: decreased Gait velocity interpretation: 1.31 - 2.62 ft/sec, indicative of limited community ambulator   General Gait Details: vc for proximity to RW, upright posture and less pressure through UEs; steady gait with RW. Fatigue  noted at end of amb.   Stairs             Wheelchair Mobility    Modified Rankin (Stroke Patients Only)       Balance Overall balance assessment: Mild deficits observed, not formally tested Sitting-balance support:  No upper extremity supported, Feet supported Sitting balance-Leahy Scale: Good     Standing balance support: During functional activity, No upper extremity supported Standing balance-Leahy Scale: Fair Standing balance comment: able to assist  with donning brace in standing without imbalance and no UE support                            Cognition Arousal/Alertness: Awake/alert Behavior During Therapy: WFL for tasks assessed/performed Overall Cognitive Status: Within Functional Limits for tasks assessed                                          Exercises      General Comments        Pertinent Vitals/Pain Pain Assessment Pain Assessment: 0-10 Pain Score: 4  Pain Location: back Pain Descriptors / Indicators: Discomfort Pain Intervention(s): Premedicated before session, Limited activity within patient's tolerance, Monitored during session    Home Living Family/patient expects to be discharged to:: Skilled nursing facility Living Arrangements: Alone   Type of Home: Apartment Home Access: Level entry       Home Layout: One level Home Equipment: Rollator (4 wheels);Cane - single point;BSC/3in1;Tub bench;Hospital bed;Grab bars - toilet;Adaptive equipment      Prior Function            PT Goals (current goals can now be found in the care plan section) Acute Rehab PT Goals Patient Stated Goal: rehab then home Time For Goal Achievement: 10/09/22 Potential to Achieve Goals: Good Progress towards PT goals: Progressing toward goals    Frequency    Min 3X/week      PT Plan Current plan remains appropriate;Frequency needs to be updated    Co-evaluation              AM-PAC PT "6 Clicks" Mobility   Outcome Measure  Help needed turning from your back to your side while in a flat bed without using bedrails?: A Little Help needed moving from lying on your back to sitting on the side of a flat bed without using bedrails?: A Little Help needed moving to and from a bed to a chair (including a wheelchair)?: A Little Help needed standing up from a chair using your arms (e.g., wheelchair or bedside chair)?: A Little Help needed to walk in hospital room?: A Little Help needed  climbing 3-5 steps with a railing? : Total 6 Click Score: 16    End of Session Equipment Utilized During Treatment: Back brace Activity Tolerance: Patient limited by fatigue Patient left: in chair;with call bell/phone within reach Nurse Communication: Mobility status;Other (comment) (pt has questions re: inhaler) PT Visit Diagnosis: Other abnormalities of gait and mobility (R26.89);Pain Pain - Right/Left:  (midline) Pain - part of body:  (back)     Time: 0938-1000 PT Time Calculation (min) (ACUTE ONLY): 22 min  Charges:  $Gait Training: 8-22 mins                      Agency  Office [phone]    Rexanne Mano 10/01/2022, 10:10 AM

## 2022-10-01 NOTE — TOC Transition Note (Signed)
Transition of Care [facility]) - CM/SW Discharge Note   Patient Details  Name: Nancy Simmons MRN: [id number] Date of Birth: 1956-05-01  Transition of [facility]) CM/SW Contact:  Vinie Sill, LCSW Phone Number: 10/01/2022, 12:58 PM   Clinical Narrative:     Patient will Discharge to: [facility]  Discharge Date: 10/01/2022 Family Notified: patient states she will call family Transport By: Corey Harold  Per MD patient is ready for discharge. RN, patient, and facility notified of discharge. Discharge Summary sent to facility. RN given number for [phone]. Ambulance transport requested for patient.   Clinical Social Worker signing off.  Thurmond Butts, MSW, LCSW Clinical Social Worker     Final next level of care: Skilled Nursing Facility Barriers to Discharge: Barriers Resolved   Patient Goals and CMS Choice      Discharge Placement                Patient chooses bed at:  [facility]support[facility]administrative officer) Patient to be transferred to facility by: [facility] Name of family member notified: patient will call fam ily Patient and family notified of of transfer: 10/01/22  Discharge Plan and Services Additional resources added to the After Visit Summary for   In-house Referral: Clinical Social Work                                   Social Determinants of Health ([place]) Interventions [place]: No Food Insecurity (09/21/2022)  Housing: Low Risk  (09/21/2022)  Transportation Needs: No Transportation Needs (09/21/2022)  Utilities: Not At Risk (09/21/2022)  Tobacco Use: Medium Risk (09/25/2022)     Readmission Risk Interventions     No data to display

## 2022-10-01 NOTE — Progress Notes (Signed)
Subjective: The patient is alert and pleasant.  She inquired about going home.  Objective: Vital signs in last 24 hours: Temp:  [98.7 F (37.1 C)-99.4 F (37.4 C)] 98.9 F (37.2 C) (03/11 0555) Pulse Rate:  [63-90] 63 (03/11 0555) Resp:  [18-20] 20 (03/11 0555) BP: (119-138)/(60-70) 119/70 (03/11 0555) SpO2:  [94 %-96 %] 94 % (03/11 0555) Estimated body mass index is 54.84 kg/m as calculated from the following:   Height as of this encounter: '5\' 2"'$  (1.575 m).   Weight as of this encounter: 136 kg.   Intake/Output from previous day: 03/10 0701 - 03/11 0700 In: 980 [P.O.:480; IV Piggyback:500] Out: 1300 [Urine:1300] Intake/Output this shift: No intake/output data recorded.  Physical exam the patient is alert and pleasant.  She looks well.  Her dressing has a moderate amount of drainage.  Her strength is normal.  Lab Results: No results for input(s): "WBC", "HGB", "HCT", "PLT" in the last 72 hours. BMET No results for input(s): "NA", "K", "CL", "CO2", "GLUCOSE", "BUN", "CREATININE", "CALCIUM" in the last 72 hours.  Studies/Results: No results found.  Assessment/Plan: Status post incision and drainage of wound: Awaiting skilled nursing facility placement versus discharge to home with IV nurse.  LOS: 10 days     [name] 10/01/2022, 11:45 AM     Patient ID: Nancy Simmons, female   DOB: [birth year], 67 y.o.   MRN: [id number]

## 2022-10-01 NOTE — Consult Note (Signed)
   [facility] CM Inpatient Consult   10/01/2022  Nancy Simmons [birth year] [id number]  [facility] [ACO] Patient: [employer] Medicare  Primary Care Provider:  Helen Hashimoto., MD, [facility]   Patient was reviewed for less than 7 days readmission ength of stay  If the patient goes to a [facility] affiliated facility then, patient can be followed by [facility] PAC RN with traditional Medicare and approved Medicare Advantage plans.  Patient currently showing for [facility] for SNF in [place].   Plan:   Notify [facility] RN who can follow for any known or needs for transitional care needs for returning to post facility care coordination needs to return to community.  For questions or referrals, please contact:   Natividad Brood, RN BSN [place]  [phone] business mobile phone Toll free office [phone]  *[place]  [phone] Fax number: [phone] [name]

## 2022-10-01 NOTE — Discharge Summary (Signed)
Physician Discharge Summary     Providing Compassionate, Quality Care - Together   Patient ID: ISSABELLE SCHENKEL MRN: [id number] DOB/AGE: 01-11-1956 67 y.o.  Admit date: 09/21/2022 Discharge date: 10/01/2022  Admission Diagnoses: Postoperative wound infection  Discharge Diagnoses:  Principal Problem:   Postoperative complication of skin involving drainage from surgical wound Active Problems:   Wound infection complicating hardware ([place])   Staph aureus infection   Class 3 severe obesity due to excess calories with serious comorbidity and body mass index (BMI) of 50.0 to 59.9 in adult [facility])   Discharged Condition: good  Hospital Course: Patient underwent a wound irrigation and debridement by Dr. Arnoldo Morale on 09/24/2022. She was admitted to 4[facility]02 following recovery from anesthesia in the PACU. Her wound cultures grew Staph aureus and Pseudomonas aeruginosa. Infectious Disease was consulted and started the patient on cefepime. She has worked with both physical and occupational therapies who feel the patient is ready for discharge back to the skilled nursing facility. She is ambulating with the aid of a walker. She is tolerating a normal diet. She is not having any bowel or bladder dysfunction. Her pain is well-controlled with oral pain medication. She is ready for discharge home.   Consults: ID and TOC  Significant Diagnostic Studies: microbiology:  Recent Results (from the past 2160 hour(s))  Glucose, capillary     Status: Abnormal   Collection Time: 08/02/22  8:27 AM  Result Value Ref Range   Glucose-Capillary 121 (H) 70 - 99 mg/dL    Comment: Glucose reference range applies only to samples taken after fasting for at least 8 hours.  Surgical pcr screen     Status: Abnormal   Collection Time: 08/02/22  9:09 AM   Specimen: Nasal Mucosa; Nasal Swab  Result Value Ref Range   MRSA, PCR NEGATIVE NEGATIVE   Staphylococcus aureus POSITIVE (A) NEGATIVE    Comment: (NOTE) The Xpert SA Assay (FDA  approved for NASAL specimens in patients 79 years of age and older), is one component of a comprehensive surveillance program. It is not intended to diagnose infection nor to guide or monitor treatment. Performed at [facility], [place] [address], [place], [place] [address]   Type and screen     Status: None   Collection Time: 08/02/22  9:22 AM  Result Value Ref Range   ABO/RH(D) O POS    Antibody Screen NEG    Sample Expiration 08/16/2022,2359    Extend sample reason      NO TRANSFUSIONS OR PREGNANCY IN THE PAST 3 MONTHS Performed at [facility], [place] [address], [place], [place] [address]   CBC per protocol     Status: Abnormal   Collection Time: 08/02/22  9:30 AM  Result Value Ref Range   WBC 5.8 4.0 - 10.5 K/uL   RBC 3.97 3.87 - 5.11 MIL/uL   Hemoglobin 11.9 (L) 12.0 - 15.0 g/dL   HCT 39.5 36.0 - 46.0 %   MCV 99.5 80.0 - 100.0 fL   MCH 30.0 26.0 - 34.0 pg   MCHC 30.1 30.0 - 36.0 g/dL   RDW 14.1 11.5 - 15.5 %   Platelets 169 150 - 400 K/uL   nRBC 0.0 0.0 - 0.2 %    Comment: Performed at [facility], [place] [address], [place], [place] [id number]  Basic metabolic panel per protocol     Status: Abnormal   Collection Time: 08/02/22  9:30 AM  Result Value Ref Range   Sodium  142 135 - 145 mmol/L   Potassium 3.6 3.5 - 5.1 mmol/L   Chloride 110 98 - 111 mmol/L   CO2 24 22 - 32 mmol/L   Glucose, Bld 122 (H) 70 - 99 mg/dL    Comment: Glucose reference range applies only to samples taken after fasting for at least 8 hours.   BUN 12 8 - 23 mg/dL   Creatinine, Ser 0.61 0.44 - 1.00 mg/dL   Calcium 9.2 8.9 - 10.3 mg/dL   GFR, Estimated >60 >60 mL/min    Comment: (NOTE) Calculated using the CKD-EPI Creatinine Equation (2021)    Anion gap 8 5 - 15    Comment: Performed at [facility] [address], [place], [place] [address]  Hemoglobin A1c per protocol     Status: Abnormal   Collection Time: 08/02/22  9:30 AM  Result Value Ref Range   Hgb A1c MFr Bld 6.3  (H) 4.8 - 5.6 %    Comment: (NOTE) Pre diabetes:          5.7%-6.4%  Diabetes:              >6.4%  Glycemic control for   <7.0% adults with diabetes    Mean Plasma Glucose 134.11 mg/dL    Comment: Performed at [facility] [address] [place], [place] [address]  Comprehensive metabolic panel     Status: Abnormal   Collection Time: 08/02/22  9:30 AM  Result Value Ref Range   Sodium 142 135 - 145 mmol/L   Potassium 3.6 3.5 - 5.1 mmol/L   Chloride 111 98 - 111 mmol/L   CO2 23 22 - 32 mmol/L   Glucose, Bld 120 (H) 70 - 99 mg/dL    Comment: Glucose reference range applies only to samples taken after fasting for at least 8 hours.   BUN 12 8 - 23 mg/dL   Creatinine, Ser 0.62 0.44 - 1.00 mg/dL   Calcium 9.1 8.9 - 10.3 mg/dL   Total Protein 7.0 6.5 - 8.1 g/dL   Albumin 3.4 (L) 3.5 - 5.0 g/dL   AST 36 15 - 41 U/L   ALT 29 0 - 44 U/L   Alkaline Phosphatase 87 38 - 126 U/L   Total Bilirubin 0.5 0.3 - 1.2 mg/dL   GFR, Estimated >60 >60 mL/min    Comment: (NOTE) Calculated using the CKD-EPI Creatinine Equation (2021)    Anion gap 8 5 - 15    Comment: Performed at [facility] [address], [place], [place] [address]  Surgical pcr screen     Status: Abnormal   Collection Time: 09/03/22  1:07 PM   Specimen: Nasal Mucosa; Nasal Swab  Result Value Ref Range   MRSA, PCR NEGATIVE NEGATIVE   Staphylococcus aureus POSITIVE (A) NEGATIVE    Comment: (NOTE) The Xpert SA Assay (FDA approved for NASAL specimens in patients 85 years of age and older), is one component of a comprehensive surveillance program. It is not intended to diagnose infection nor to guide or monitor treatment. Performed at [facility], [place] [address] [place], [place] [address]   Type and screen [place]     Status: None   Collection Time: 09/03/22  1:48 PM  Result Value Ref Range   ABO/RH(D) O POS    Antibody Screen NEG    Sample Expiration 09/17/2022,2359    Extend  sample reason      NO TRANSFUSIONS OR PREGNANCY IN  THE PAST 3 MONTHS Performed at [facility], [place] [address], [place], [place] [address]   Glucose, capillary     Status: Abnormal   Collection Time: 09/10/22  9:37 AM  Result Value Ref Range   Glucose-Capillary 134 (H) 70 - 99 mg/dL    Comment: Glucose reference range applies only to samples taken after fasting for at least 8 hours.  Glucose, capillary     Status: Abnormal   Collection Time: 09/10/22  3:52 PM  Result Value Ref Range   Glucose-Capillary 154 (H) 70 - 99 mg/dL    Comment: Glucose reference range applies only to samples taken after fasting for at least 8 hours.  Glucose, capillary     Status: Abnormal   Collection Time: 09/12/22  5:39 AM  Result Value Ref Range   Glucose-Capillary 145 (H) 70 - 99 mg/dL    Comment: Glucose reference range applies only to samples taken after fasting for at least 8 hours.  Blood gas, arterial     Status: Abnormal   Collection Time: 09/12/22  5:57 AM  Result Value Ref Range   pH, Arterial 7.39 7.35 - 7.45   pCO2 arterial 42 32 - 48 mmHg   pO2, Arterial 70 (L) 83 - 108 mmHg   Bicarbonate 25.4 20.0 - 28.0 mmol/L   Acid-Base Excess 0.5 0.0 - 2.0 mmol/L   O2 Saturation 93.9 %   Patient temperature 37.6    Collection site LEFT RADIAL    Drawn by COLLECTED BY RT    [place] test (pass/fail) PASS PASS    Comment: Performed at [facility] [address], [place], [place] [id number]  Basic metabolic panel     Status: Abnormal   Collection Time: 09/12/22  7:03 AM  Result Value Ref Range   Sodium 139 135 - 145 mmol/L   Potassium 3.6 3.5 - 5.1 mmol/L   Chloride 106 98 - 111 mmol/L   CO2 22 22 - 32 mmol/L   Glucose, Bld 134 (H) 70 - 99 mg/dL    Comment: Glucose reference range applies only to samples taken after fasting for at least 8 hours.   BUN 14 8 - 23 mg/dL   Creatinine, Ser 0.67 0.44 - 1.00 mg/dL   Calcium 8.9 8.9 - 10.3 mg/dL   GFR, Estimated >60 >60 mL/min    Comment:  (NOTE) Calculated using the CKD-EPI Creatinine Equation (2021)    Anion gap 11 5 - 15    Comment: Performed at [facility] [address] [place], [place] [address]  CBC     Status: Abnormal   Collection Time: 09/12/22  8:26 AM  Result Value Ref Range   WBC 10.7 (H) 4.0 - 10.5 K/uL   RBC 3.60 (L) 3.87 - 5.11 MIL/uL   Hemoglobin 10.9 (L) 12.0 - 15.0 g/dL   HCT 33.9 (L) 36.0 - 46.0 %   MCV 94.2 80.0 - 100.0 fL   MCH 30.3 26.0 - 34.0 pg   MCHC 32.2 30.0 - 36.0 g/dL   RDW 14.6 11.5 - 15.5 %   Platelets 163 150 - 400 K/uL   nRBC 0.0 0.0 - 0.2 %    Comment: Performed at [facility], [place] [address], [place], [place] [address]  Urinalysis, Routine w reflex microscopic -Urine, Clean Catch     Status: Abnormal   Collection Time: 09/12/22 12:56 PM  Result Value Ref Range   Color, Urine YELLOW YELLOW   APPearance CLEAR  CLEAR   Specific Gravity, Urine 1.018 1.005 - 1.030   pH 5.0 5.0 - 8.0   Glucose, UA NEGATIVE NEGATIVE mg/dL   Hgb urine dipstick NEGATIVE NEGATIVE   Bilirubin Urine NEGATIVE NEGATIVE   Ketones, ur NEGATIVE NEGATIVE mg/dL   Protein, ur NEGATIVE NEGATIVE mg/dL   Nitrite NEGATIVE NEGATIVE   Leukocytes,Ua MODERATE (A) NEGATIVE   RBC / HPF 0-5 0 - 5 RBC/hpf   WBC, UA 11-20 0 - 5 WBC/hpf   Bacteria, UA RARE (A) NONE SEEN   Squamous Epithelial / HPF 0-5 0 - 5 /HPF    Comment: Performed at [facility], [place] [address] [place], [place] [address]  CBC     Status: Abnormal   Collection Time: 09/12/22  2:58 PM  Result Value Ref Range   WBC 9.7 4.0 - 10.5 K/uL   RBC 3.36 (L) 3.87 - 5.11 MIL/uL   Hemoglobin 10.0 (L) 12.0 - 15.0 g/dL   HCT 31.9 (L) 36.0 - 46.0 %   MCV 94.9 80.0 - 100.0 fL   MCH 29.8 26.0 - 34.0 pg   MCHC 31.3 30.0 - 36.0 g/dL   RDW 14.5 11.5 - 15.5 %   Platelets 155 150 - 400 K/uL   nRBC 0.0 0.0 - 0.2 %    Comment: Performed at [facility], [place] [address] [place], [place] [id number]  Basic metabolic panel     Status: Abnormal    Collection Time: 09/12/22  2:58 PM  Result Value Ref Range   Sodium 140 135 - 145 mmol/L   Potassium 3.7 3.5 - 5.1 mmol/L   Chloride 107 98 - 111 mmol/L   CO2 25 22 - 32 mmol/L   Glucose, Bld 156 (H) 70 - 99 mg/dL    Comment: Glucose reference range applies only to samples taken after fasting for at least 8 hours.   BUN 14 8 - 23 mg/dL   Creatinine, Ser 0.69 0.44 - 1.00 mg/dL   Calcium 8.7 (L) 8.9 - 10.3 mg/dL   GFR, Estimated >60 >60 mL/min    Comment: (NOTE) Calculated using the CKD-EPI Creatinine Equation (2021)    Anion gap 8 5 - 15    Comment: Performed at [facility] [address], [place], [place]  [address]  Culture, blood (Routine X 2) w Reflex to ID Panel     Status: None   Collection Time: 09/12/22  2:59 PM   Specimen: BLOOD LEFT HAND  Result Value Ref Range   Specimen Description BLOOD LEFT HAND    Special Requests      BOTTLES DRAWN AEROBIC AND ANAEROBIC Blood Culture results may not be optimal due to an inadequate volume of blood received in culture bottles   Culture      NO GROWTH 5 DAYS Performed at [facility], [place] [address], [place], [place] [address]    Report Status 09/17/2022 FINAL   Culture, blood (Routine X 2) w Reflex to ID Panel     Status: None   Collection Time: 09/12/22  3:00 PM   Specimen: BLOOD LEFT HAND  Result Value Ref Range   Specimen Description BLOOD LEFT HAND    Special Requests      BOTTLES DRAWN AEROBIC ONLY Blood Culture results may not be optimal due to an inadequate volume of blood received in culture bottles   Culture      NO GROWTH 5 DAYS Performed at [facility] [address]  [place], [place] [address]    Report Status 09/17/2022 FINAL   Basic metabolic panel     Status: Abnormal   Collection Time: 09/21/22  3:29 PM  Result Value Ref Range   Sodium 139 135 - 145 mmol/L   Potassium 3.8 3.5 - 5.1 mmol/L   Chloride 106 98 - 111 mmol/L   CO2 24 22 - 32 mmol/L   Glucose, Bld 107 (H) 70 - 99 mg/dL     Comment: Glucose reference range applies only to samples taken after fasting for at least 8 hours.   BUN 8 8 - 23 mg/dL   Creatinine, Ser 0.60 0.44 - 1.00 mg/dL   Calcium 8.9 8.9 - 10.3 mg/dL   GFR, Estimated >60 >60 mL/min    Comment: (NOTE) Calculated using the CKD-EPI Creatinine Equation (2021)    Anion gap 9 5 - 15    Comment: Performed at [facility] [address] [place], [place] [address]  CBC with Differential     Status: Abnormal   Collection Time: 09/21/22  3:29 PM  Result Value Ref Range   WBC 11.3 (H) 4.0 - 10.5 K/uL   RBC 3.44 (L) 3.87 - 5.11 MIL/uL   Hemoglobin 10.4 (L) 12.0 - 15.0 g/dL   HCT 33.4 (L) 36.0 - 46.0 %   MCV 97.1 80.0 - 100.0 fL   MCH 30.2 26.0 - 34.0 pg   MCHC 31.1 30.0 - 36.0 g/dL   RDW 14.5 11.5 - 15.5 %   Platelets 305 150 - 400 K/uL   nRBC 0.0 0.0 - 0.2 %   Neutrophils Relative % 59 %   Neutro Abs 6.7 1.7 - 7.7 K/uL   Lymphocytes Relative 29 %   Lymphs Abs 3.2 0.7 - 4.0 K/uL   Monocytes Relative 9 %   Monocytes Absolute 1.0 0.1 - 1.0 K/uL   Eosinophils Relative 3 %   Eosinophils Absolute 0.3 0.0 - 0.5 K/uL   Basophils Relative 0 %   Basophils Absolute 0.0 0.0 - 0.1 K/uL   Immature Granulocytes 0 %   Abs Immature Granulocytes 0.05 0.00 - 0.07 K/uL    Comment: Performed at [facility] [address] [place], [place] [address]  Sedimentation rate     Status: Abnormal   Collection Time: 09/21/22  3:29 PM  Result Value Ref Range   Sed Rate 76 (H) 0 - 22 mm/hr    Comment: Performed at [facility] [address], [place], [place] [address]  C-reactive protein     Status: Abnormal   Collection Time: 09/21/22  3:29 PM  Result Value Ref Range   CRP 2.7 (H) <1.0 mg/dL    Comment: Performed at [facility], [place] [address] [place], [place] [address]  HIV Antibody (routine testing w rflx)     Status: None   Collection Time: 09/21/22  3:29 PM  Result Value Ref Range   HIV Screen 4th Generation wRfx Non Reactive Non  Reactive    Comment: Performed at [facility], [place] [address] [place], [place] [address]  Culture, blood (Routine X 2) w Reflex to ID Panel     Status: None   Collection Time: 09/21/22  3:31 PM   Specimen: BLOOD  Result Value Ref Range   Specimen Description BLOOD LEFT ANTECUBITAL    Special Requests      BOTTLES DRAWN AEROBIC AND ANAEROBIC Blood Culture adequate volume   Culture  NO GROWTH 5 DAYS Performed at [facility], [place] [address], [place], [place] [address]    Report Status 09/26/2022 FINAL   Urinalysis, Complete w Microscopic -Urine, Clean Catch     Status: Abnormal   Collection Time: 09/21/22  4:05 PM  Result Value Ref Range   Color, Urine YELLOW YELLOW   APPearance CLEAR CLEAR   Specific Gravity, Urine 1.012 1.005 - 1.030   pH 7.0 5.0 - 8.0   Glucose, UA NEGATIVE NEGATIVE mg/dL   Hgb urine dipstick NEGATIVE NEGATIVE   Bilirubin Urine NEGATIVE NEGATIVE   Ketones, ur NEGATIVE NEGATIVE mg/dL   Protein, ur NEGATIVE NEGATIVE mg/dL   Nitrite NEGATIVE NEGATIVE   Leukocytes,Ua SMALL (A) NEGATIVE   RBC / HPF 0-5 0 - 5 RBC/hpf   WBC, UA 11-20 0 - 5 WBC/hpf   Bacteria, UA RARE (A) NONE SEEN   Squamous Epithelial / HPF 0-5 0 - 5 /HPF   Mucus PRESENT     Comment: Performed at [facility], [place] [address], [place], [place] [address]  Glucose, capillary     Status: None   Collection Time: 09/21/22  4:12 PM  Result Value Ref Range   Glucose-Capillary 91 70 - 99 mg/dL    Comment: Glucose reference range applies only to samples taken after fasting for at least 8 hours.  Culture, blood (Routine X 2) w Reflex to ID Panel     Status: None   Collection Time: 09/21/22  4:25 PM   Specimen: BLOOD LEFT HAND  Result Value Ref Range   Specimen Description BLOOD LEFT HAND    Special Requests      BOTTLES DRAWN AEROBIC ONLY Blood Culture results may not be optimal due to an inadequate volume of blood received in culture bottles   Culture      NO GROWTH 5  DAYS Performed at [facility] [address] [place], [place] [address]    Report Status 09/26/2022 FINAL   Glucose, capillary     Status: Abnormal   Collection Time: 09/21/22  9:17 PM  Result Value Ref Range   Glucose-Capillary 121 (H) 70 - 99 mg/dL    Comment: Glucose reference range applies only to samples taken after fasting for at least 8 hours.  Glucose, capillary     Status: Abnormal   Collection Time: 09/22/22  7:29 AM  Result Value Ref Range   Glucose-Capillary 122 (H) 70 - 99 mg/dL    Comment: Glucose reference range applies only to samples taken after fasting for at least 8 hours.  Glucose, capillary     Status: Abnormal   Collection Time: 09/22/22 11:04 AM  Result Value Ref Range   Glucose-Capillary 151 (H) 70 - 99 mg/dL    Comment: Glucose reference range applies only to samples taken after fasting for at least 8 hours.   Comment 1 Notify RN    Comment 2 Document in Chart   Glucose, capillary     Status: Abnormal   Collection Time: 09/22/22  4:06 PM  Result Value Ref Range   Glucose-Capillary 104 (H) 70 - 99 mg/dL    Comment: Glucose reference range applies only to samples taken after fasting for at least 8 hours.   Comment 1 Notify RN    Comment 2 Document in Chart   Glucose, capillary     Status: Abnormal   Collection Time: 09/22/22  9:17 PM  Result Value Ref Range   Glucose-Capillary 136 (H) 70 -  99 mg/dL    Comment: Glucose reference range applies only to samples taken after fasting for at least 8 hours.  Basic metabolic panel     Status: Abnormal   Collection Time: 09/23/22  7:27 AM  Result Value Ref Range   Sodium 138 135 - 145 mmol/L   Potassium 3.6 3.5 - 5.1 mmol/L   Chloride 110 98 - 111 mmol/L   CO2 22 22 - 32 mmol/L   Glucose, Bld 109 (H) 70 - 99 mg/dL    Comment: Glucose reference range applies only to samples taken after fasting for at least 8 hours.   BUN 10 8 - 23 mg/dL   Creatinine, Ser 0.66 0.44 - 1.00 mg/dL   Calcium 8.8 (L) 8.9 -  10.3 mg/dL   GFR, Estimated >60 >60 mL/min    Comment: (NOTE) Calculated using the CKD-EPI Creatinine Equation (2021)    Anion gap 6 5 - 15    Comment: Performed at [facility] [address] [place],  [address]  CBC     Status: Abnormal   Collection Time: 09/23/22  7:27 AM  Result Value Ref Range   WBC 7.6 4.0 - 10.5 K/uL   RBC 3.20 (L) 3.87 - 5.11 MIL/uL   Hemoglobin 9.7 (L) 12.0 - 15.0 g/dL   HCT 30.4 (L) 36.0 - 46.0 %   MCV 95.0 80.0 - 100.0 fL   MCH 30.3 26.0 - 34.0 pg   MCHC 31.9 30.0 - 36.0 g/dL   RDW 14.2 11.5 - 15.5 %   Platelets 284 150 - 400 K/uL   nRBC 0.0 0.0 - 0.2 %    Comment: Performed at [facility], [place] [address] [place], [place] [address]  Glucose, capillary     Status: Abnormal   Collection Time: 09/23/22  7:34 AM  Result Value Ref Range   Glucose-Capillary 108 (H) 70 - 99 mg/dL    Comment: Glucose reference range applies only to samples taken after fasting for at least 8 hours.   Comment 1 Notify RN    Comment 2 Document in Chart   Glucose, capillary     Status: Abnormal   Collection Time: 09/23/22 11:30 AM  Result Value Ref Range   Glucose-Capillary 138 (H) 70 - 99 mg/dL    Comment: Glucose reference range applies only to samples taken after fasting for at least 8 hours.   Comment 1 Notify RN    Comment 2 Document in Chart   Glucose, capillary     Status: Abnormal   Collection Time: 09/23/22  5:10 PM  Result Value Ref Range   Glucose-Capillary 169 (H) 70 - 99 mg/dL    Comment: Glucose reference range applies only to samples taken after fasting for at least 8 hours.   Comment 1 Notify RN    Comment 2 Document in Chart   Glucose, capillary     Status: Abnormal   Collection Time: 09/23/22  9:29 PM  Result Value Ref Range   Glucose-Capillary 135 (H) 70 - 99 mg/dL    Comment: Glucose reference range applies only to samples taken after fasting for at least 8 hours.  Glucose, capillary     Status: Abnormal   Collection Time:  09/24/22  9:34 AM  Result Value Ref Range   Glucose-Capillary 164 (H) 70 - 99 mg/dL    Comment: Glucose reference range applies only to samples taken after fasting for at least 8 hours.  Glucose, capillary  Status: Abnormal   Collection Time: 09/24/22 12:00 PM  Result Value Ref Range   Glucose-Capillary 112 (H) 70 - 99 mg/dL    Comment: Glucose reference range applies only to samples taken after fasting for at least 8 hours.  Glucose, capillary     Status: Abnormal   Collection Time: 09/24/22  4:18 PM  Result Value Ref Range   Glucose-Capillary 100 (H) 70 - 99 mg/dL    Comment: Glucose reference range applies only to samples taken after fasting for at least 8 hours.  Glucose, capillary     Status: None   Collection Time: 09/24/22  6:38 PM  Result Value Ref Range   Glucose-Capillary 92 70 - 99 mg/dL    Comment: Glucose reference range applies only to samples taken after fasting for at least 8 hours.  Aerobic/Anaerobic Culture w Gram Stain (surgical/deep wound)     Status: None   Collection Time: 09/24/22  7:55 PM   Specimen: Wound  Result Value Ref Range   Specimen Description WOUND    Special Requests LUMBAR INFECTION    Gram Stain      RARE WBC PRESENT, PREDOMINANTLY PMN FEW GRAM POSITIVE COCCI IN CLUSTERS    Culture      FEW STAPHYLOCOCCUS AUREUS RARE PSEUDOMONAS AERUGINOSA NO ANAEROBES ISOLATED Performed at [facility], [address], [place], [place] [address]    Report Status 09/29/2022 FINAL    Organism ID, Bacteria STAPHYLOCOCCUS AUREUS    Organism ID, Bacteria PSEUDOMONAS AERUGINOSA       Susceptibility   Pseudomonas aeruginosa - MIC*    CEFTAZIDIME 2 SENSITIVE Sensitive     CIPROFLOXACIN <=0.25 SENSITIVE Sensitive     GENTAMICIN <=1 SENSITIVE Sensitive     IMIPENEM 2 SENSITIVE Sensitive     PIP/TAZO <=4 SENSITIVE Sensitive     CEFEPIME 2 SENSITIVE Sensitive     * RARE PSEUDOMONAS AERUGINOSA   Staphylococcus aureus - MIC*    CIPROFLOXACIN <=0.5  SENSITIVE Sensitive     ERYTHROMYCIN <=0.25 SENSITIVE Sensitive     GENTAMICIN <=0.5 SENSITIVE Sensitive     OXACILLIN 0.5 SENSITIVE Sensitive     TETRACYCLINE <=1 SENSITIVE Sensitive     VANCOMYCIN 1 SENSITIVE Sensitive     TRIMETH/SULFA <=10 SENSITIVE Sensitive     CLINDAMYCIN <=0.25 SENSITIVE Sensitive     RIFAMPIN <=0.5 SENSITIVE Sensitive     Inducible Clindamycin NEGATIVE Sensitive     * FEW STAPHYLOCOCCUS AUREUS  Glucose, capillary     Status: None   Collection Time: 09/24/22  8:36 PM  Result Value Ref Range   Glucose-Capillary 82 70 - 99 mg/dL    Comment: Glucose reference range applies only to samples taken after fasting for at least 8 hours.  Glucose, capillary     Status: Abnormal   Collection Time: 09/24/22  9:55 PM  Result Value Ref Range   Glucose-Capillary 117 (H) 70 - 99 mg/dL    Comment: Glucose reference range applies only to samples taken after fasting for at least 8 hours.  Glucose, capillary     Status: Abnormal   Collection Time: 09/25/22  7:22 AM  Result Value Ref Range   Glucose-Capillary 183 (H) 70 - 99 mg/dL    Comment: Glucose reference range applies only to samples taken after fasting for at least 8 hours.  Vancomycin, trough     Status: Abnormal   Collection Time: 09/25/22  1:28 PM  Result Value Ref Range   Vancomycin Tr 8 (L) 15 -  20 ug/mL    Comment: Performed at [facility], [place] [address] [place], [place] [address]  Glucose, capillary     Status: Abnormal   Collection Time: 09/25/22  6:22 PM  Result Value Ref Range   Glucose-Capillary 177 (H) 70 - 99 mg/dL    Comment: Glucose reference range applies only to samples taken after fasting for at least 8 hours.  Glucose, capillary     Status: Abnormal   Collection Time: 09/25/22  9:04 PM  Result Value Ref Range   Glucose-Capillary 179 (H) 70 - 99 mg/dL    Comment: Glucose reference range applies only to samples taken after fasting for at least 8 hours.  Glucose, capillary     Status:  Abnormal   Collection Time: 09/26/22  7:41 AM  Result Value Ref Range   Glucose-Capillary 122 (H) 70 - 99 mg/dL    Comment: Glucose reference range applies only to samples taken after fasting for at least 8 hours.  Glucose, capillary     Status: Abnormal   Collection Time: 09/26/22 11:30 AM  Result Value Ref Range   Glucose-Capillary 162 (H) 70 - 99 mg/dL    Comment: Glucose reference range applies only to samples taken after fasting for at least 8 hours.  Glucose, capillary     Status: Abnormal   Collection Time: 09/26/22  4:14 PM  Result Value Ref Range   Glucose-Capillary 118 (H) 70 - 99 mg/dL    Comment: Glucose reference range applies only to samples taken after fasting for at least 8 hours.  Glucose, capillary     Status: Abnormal   Collection Time: 09/26/22  9:27 PM  Result Value Ref Range   Glucose-Capillary 139 (H) 70 - 99 mg/dL    Comment: Glucose reference range applies only to samples taken after fasting for at least 8 hours.  Glucose, capillary     Status: Abnormal   Collection Time: 09/27/22  7:43 AM  Result Value Ref Range   Glucose-Capillary 131 (H) 70 - 99 mg/dL    Comment: Glucose reference range applies only to samples taken after fasting for at least 8 hours.  Glucose, capillary     Status: Abnormal   Collection Time: 09/27/22 11:38 AM  Result Value Ref Range   Glucose-Capillary 183 (H) 70 - 99 mg/dL    Comment: Glucose reference range applies only to samples taken after fasting for at least 8 hours.  Glucose, capillary     Status: Abnormal   Collection Time: 09/27/22  4:26 PM  Result Value Ref Range   Glucose-Capillary 161 (H) 70 - 99 mg/dL    Comment: Glucose reference range applies only to samples taken after fasting for at least 8 hours.  Glucose, capillary     Status: Abnormal   Collection Time: 09/27/22  9:47 PM  Result Value Ref Range   Glucose-Capillary 176 (H) 70 - 99 mg/dL    Comment: Glucose reference range applies only to samples taken after  fasting for at least 8 hours.  Glucose, capillary     Status: Abnormal   Collection Time: 09/28/22  8:08 AM  Result Value Ref Range   Glucose-Capillary 142 (H) 70 - 99 mg/dL    Comment: Glucose reference range applies only to samples taken after fasting for at least 8 hours.  Glucose, capillary     Status: Abnormal   Collection Time: 09/28/22 11:25 AM  Result Value Ref Range   Glucose-Capillary 131 (H) 70 - 99 mg/dL  Comment: Glucose reference range applies only to samples taken after fasting for at least 8 hours.  Glucose, capillary     Status: Abnormal   Collection Time: 09/28/22  2:14 PM  Result Value Ref Range   Glucose-Capillary 119 (H) 70 - 99 mg/dL    Comment: Glucose reference range applies only to samples taken after fasting for at least 8 hours.  Glucose, capillary     Status: Abnormal   Collection Time: 09/28/22  5:11 PM  Result Value Ref Range   Glucose-Capillary 172 (H) 70 - 99 mg/dL    Comment: Glucose reference range applies only to samples taken after fasting for at least 8 hours.  Glucose, capillary     Status: Abnormal   Collection Time: 09/28/22  9:47 PM  Result Value Ref Range   Glucose-Capillary 172 (H) 70 - 99 mg/dL    Comment: Glucose reference range applies only to samples taken after fasting for at least 8 hours.  Glucose, capillary     Status: Abnormal   Collection Time: 09/29/22  7:56 AM  Result Value Ref Range   Glucose-Capillary 134 (H) 70 - 99 mg/dL    Comment: Glucose reference range applies only to samples taken after fasting for at least 8 hours.   Comment 1 Notify RN    Comment 2 Document in Chart   Glucose, capillary     Status: Abnormal   Collection Time: 09/29/22 11:50 AM  Result Value Ref Range   Glucose-Capillary 143 (H) 70 - 99 mg/dL    Comment: Glucose reference range applies only to samples taken after fasting for at least 8 hours.  Glucose, capillary     Status: Abnormal   Collection Time: 09/29/22  3:58 PM  Result Value Ref Range    Glucose-Capillary 163 (H) 70 - 99 mg/dL    Comment: Glucose reference range applies only to samples taken after fasting for at least 8 hours.   Comment 1 Notify RN    Comment 2 Document in Chart   Glucose, capillary     Status: Abnormal   Collection Time: 09/29/22  5:37 PM  Result Value Ref Range   Glucose-Capillary 153 (H) 70 - 99 mg/dL    Comment: Glucose reference range applies only to samples taken after fasting for at least 8 hours.  Glucose, capillary     Status: Abnormal   Collection Time: 09/29/22  9:07 PM  Result Value Ref Range   Glucose-Capillary 147 (H) 70 - 99 mg/dL    Comment: Glucose reference range applies only to samples taken after fasting for at least 8 hours.  Glucose, capillary     Status: Abnormal   Collection Time: 09/30/22  8:00 AM  Result Value Ref Range   Glucose-Capillary 126 (H) 70 - 99 mg/dL    Comment: Glucose reference range applies only to samples taken after fasting for at least 8 hours.   Comment 1 Notify RN    Comment 2 Document in Chart   Glucose, capillary     Status: Abnormal   Collection Time: 09/30/22 10:03 AM  Result Value Ref Range   Glucose-Capillary 139 (H) 70 - 99 mg/dL    Comment: Glucose reference range applies only to samples taken after fasting for at least 8 hours.  Glucose, capillary     Status: Abnormal   Collection Time: 09/30/22  1:03 PM  Result Value Ref Range   Glucose-Capillary 179 (H) 70 - 99 mg/dL    Comment: Glucose reference range applies only  to samples taken after fasting for at least 8 hours.  Glucose, capillary     Status: Abnormal   Collection Time: 09/30/22  4:14 PM  Result Value Ref Range   Glucose-Capillary 116 (H) 70 - 99 mg/dL    Comment: Glucose reference range applies only to samples taken after fasting for at least 8 hours.   Comment 1 Notify RN    Comment 2 Document in Chart   Glucose, capillary     Status: Abnormal   Collection Time: 09/30/22  9:05 PM  Result Value Ref Range   Glucose-Capillary 137  (H) 70 - 99 mg/dL    Comment: Glucose reference range applies only to samples taken after fasting for at least 8 hours.  Glucose, capillary     Status: Abnormal   Collection Time: 10/01/22  7:38 AM  Result Value Ref Range   Glucose-Capillary 138 (H) 70 - 99 mg/dL    Comment: Glucose reference range applies only to samples taken after fasting for at least 8 hours.  Glucose, capillary     Status: Abnormal   Collection Time: 10/01/22 11:10 AM  Result Value Ref Range   Glucose-Capillary 164 (H) 70 - 99 mg/dL    Comment: Glucose reference range applies only to samples taken after fasting for at least 8 hours.     Treatments: surgery: The patient is a 67 year old morbidly obese black female on whom I performed a lumbar fusion about 2 weeks ago. She was discharged to a skilled nursing facility and has developed drainage from her wound. I discussed the options and recommended an incision and drainage of her wound. She has decided proceed with surgery.   Discharge Exam: Blood pressure 119/70, pulse 63, temperature 98.8 F (37.1 C), temperature source Oral, resp. rate 18, height '5\' 2"'$  (1.575 m), weight 136 kg, SpO2 94 %.  Per report: Expand All Collapse All  Subjective: The patient is alert and pleasant.  She inquired about going home.   Objective: Vital signs in last 24 hours: Temp:  [98.7 F (37.1 C)-99.4 F (37.4 C)] 98.9 F (37.2 C) (03/11 0555) Pulse Rate:  [63-90] 63 (03/11 0555) Resp:  [18-20] 20 (03/11 0555) BP: (119-138)/(60-70) 119/70 (03/11 0555) SpO2:  [94 %-96 %] 94 % (03/11 0555) Estimated body mass index is 54.84 kg/m as calculated from the following:   Height as of this encounter: '5\' 2"'$  (1.575 m).   Weight as of this encounter: 136 kg.     Intake/Output from previous day: 03/10 0701 - 03/11 0700 In: 980 [P.O.:480; IV Piggyback:500] Out: 1300 [Urine:1300] Intake/Output this shift: No intake/output data recorded.   Physical exam the patient is alert and pleasant.   She looks well.  Her dressing has a moderate amount of drainage.  Her strength is normal.    Disposition: Discharge disposition: 03-Skilled Nursing Facility       Discharge Instructions     Call MD for:  difficulty breathing, headache or visual disturbances   Complete by: As directed    Call MD for:  hives   Complete by: As directed    Call MD for:  persistant nausea and vomiting   Complete by: As directed    Call MD for:  redness, tenderness, or signs of infection (pain, swelling, redness, odor or green/yellow discharge around incision site)   Complete by: As directed    Call MD for:  severe uncontrolled pain   Complete by: As directed    Call MD for:  temperature >100.4  Complete by: As directed    Diet - low sodium heart healthy   Complete by: As directed    Home infusion instructions   Complete by: As directed    Instructions: Flushing of vascular access device: 0.9% NaCl pre/post medication administration and prn patency; Heparin 100 u/ml, 43m for implanted ports and Heparin 10u/ml, 564mfor all other central venous catheters.   Increase activity slowly   Complete by: As directed    No dressing needed   Complete by: As directed       Allergies as of 10/01/2022       Reactions   Acetaminophen Other (See Comments)   PATIENT HAS AUTOIMMUNE HEPATITIS PATIENT IS * NOT * TO RECEIVE ANY ACETAMINOPHEN   Penicillin G Rash, Other (See Comments)   Has patient had a PCN reaction causing immediate rash, facial/tongue/throat swelling, SOB or lightheadedness with hypotension: No Has patient had a PCN reaction causing severe rash involving mucus membranes or skin necrosis: No Has patient had a PCN reaction that required hospitalization: No Has patient had a PCN reaction occurring within the last 10 years: #  #  #  YES  #  #  #        Medication List     STOP taking these medications    morphine 15 MG tablet Commonly known as: MSIR   nitrofurantoin  (macrocrystal-monohydrate) 100 MG capsule Commonly known as: MACROBID   tiZANidine 2 MG tablet Commonly known as: ZANAFLEX       TAKE these medications    albuterol (2.5 MG/3ML) 0.083% nebulizer solution Commonly known as: PROVENTIL Take 2.5 mg by nebulization 3 (three) times daily.   albuterol 108 (90 Base) MCG/ACT inhaler Commonly known as: VENTOLIN HFA Inhale 1-2 puffs into the lungs every 6 (six) hours as needed for wheezing or shortness of breath.   amLODipine 5 MG tablet Commonly known as: NORVASC Take 5 mg by mouth daily.   Anoro Ellipta 62.5-25 MCG/ACT Aepb Generic drug: umeclidinium-vilanterol Inhale 1 puff into the lungs daily.   apixaban 5 MG Tabs tablet Commonly known as: Eliquis Take 1 tablet (5 mg total) by mouth 2 (two) times daily. Restart on 09/07/2019   Belsomra 10 MG Tabs Generic drug: Suvorexant Take 10 mg by mouth at bedtime as needed (sleep).   Bevespi Aerosphere 9-4.8 MCG/ACT Aero Generic drug: Glycopyrrolate-Formoterol Inhale 2 puffs into the lungs 2 (two) times daily.   ceFEPime  IVPB Commonly known as: MAXIPIME Inject 2 g into the vein every 8 (eight) hours. Indication:  Lumbar wound infection with hardware  First Dose: Yes Last Day of Therapy:  11/05/22 Labs - Once weekly:  CBC/D and BMP, Labs - Every other week:  ESR and CRP   cyclobenzaprine 10 MG tablet Commonly known as: FLEXERIL Take 1 tablet (10 mg total) by mouth 3 (three) times daily as needed for muscle spasms.   diclofenac sodium 1 % Gel Commonly known as: VOLTAREN Apply 1 application topically 4 (four) times daily as needed (for pain).   docusate sodium 100 MG capsule Commonly known as: COLACE Take 1 capsule (100 mg total) by mouth 2 (two) times daily.   escitalopram 10 MG tablet Commonly known as: LEXAPRO Take 10 mg by mouth daily.   fluticasone 50 MCG/ACT nasal spray Commonly known as: FLONASE Place 2 sprays into both nostrils daily.   hydrALAZINE 10 MG  tablet Commonly known as: APRESOLINE Take 10 mg by mouth 4 (four) times daily.   linaclotide 145 MCG Caps  capsule Commonly known as: LINZESS Take 145 mcg by mouth daily before breakfast.   liraglutide 18 MG/3ML Sopn Commonly known as: VICTOZA Inject 1.8 mg into the skin daily.   meclizine 25 MG tablet Commonly known as: ANTIVERT Take 25 mg by mouth every 8 (eight) hours as needed for dizziness.   metoprolol tartrate 50 MG tablet Commonly known as: LOPRESSOR Take 50 mg by mouth 2 (two) times daily.   MULTIVITAMIN GUMMIES WOMENS PO Take 2 each by mouth daily.   Oxycodone HCl 10 MG Tabs Take 1 tablet (10 mg total) by mouth every 4 (four) hours as needed for severe pain ((score 7 to 10)). What changed:  reasons to take this Another medication with the same name was removed. Continue taking this medication, and follow the directions you see here.   pantoprazole 40 MG tablet Commonly known as: PROTONIX Take 40 mg by mouth daily.   pregabalin 100 MG capsule Commonly known as: LYRICA Take 1 capsule (100 mg total) by mouth 3 (three) times daily.   rOPINIRole 1 MG tablet Commonly known as: REQUIP Take 1 mg by mouth at bedtime.   rosuvastatin 5 MG tablet Commonly known as: CRESTOR Take 5 mg by mouth daily.   Trilipix 135 MG capsule Generic drug: Choline Fenofibrate Take 135 mg by mouth every evening.   Vitamin D (Ergocalciferol) 1.25 MG (50000 UNIT) Caps capsule Commonly known as: DRISDOL Take 50,000 Units by mouth every 7 (seven) days. 'Sunday               Home Infusion Instuctions  (From admission, onward)           Start     Ordered   09/28/22 0000  Home infusion instructions       Question:  Instructions  Answer:  Flushing of vascular access device: 0.9% NaCl pre/post medication administration and prn patency; Heparin 100 u/ml, 5ml for implanted ports and Heparin 10u/ml, 5ml for all other central venous catheters.   09/28/22 1702               Discharge Care Instructions  (From admission, onward)           Start     Ordered   10/01/22 0000  No dressing needed        03'$ /11/24 1230            Follow-up Information     Newman Pies, MD. Schedule an appointment as soon as possible for a visit in 2 week(s).   Specialty: Neurosurgery Why: Sutures will be removed in the office. Contact information: [address] [place] [address] [phone]                 Signed: Viona Gilmore, DNP, AGNP-C Nurse Practitioner  [facility] [address], [place], [place], [place] [address] P: [phone]    F: [phone]  10/01/2022, 12:30 PM

## 2022-10-17 ENCOUNTER — Telehealth: Payer: Self-pay | Admitting: Pharmacist

## 2022-10-17 NOTE — Telephone Encounter (Signed)
[facility] called back and the facility is unable to do continuous infusion. Recommended a dose of 4.5 gm IV q6h since patient has pseudomonas growing in culture. [facility]ories Heinz will call back with any issues.  Angelita Harnack L. Liani Caris, PharmD, BCIDP, AAHIVP, CPP Clinical Pharmacist Practitioner Infectious Diseases [place] [facility] 10/17/2022, 3:31 PM

## 2022-10-17 NOTE — Telephone Encounter (Signed)
Claudell Kyle from SNF called regarding patient. She is currently receiving cefepime IV through 11/05/22 for pseudomonas and MSSA hardware complicating lumbar wound infection. Patient tolerated cefepime well at the hospital but Amb[facility] states that she has developed hives. [facility] stopped the cefepime yesterday and hives resolved. She restarted it this morning and hives returned. She has not started any other new medications since hives developed. She did say that the on-call over night staff started patient on clindamycin in the meantime. Complicated case due to organisms growing on operative cultures. Clindamycin is clearly not a good option.  Discussed with Dr. Juleen China. Will trial zosyn for her. Called [facility] back and gave verbal to stop clindamycin and cefepime and start zosyn continuous infusion at 13.5gm IV daily. Patient weighs 136 kg and SCr is stable. [facility] Heinz will check to make sure they can do continuous infusion. If not, I asked her to call back for a different dosing. She verbalized understanding.  Onika Gudiel L. Eber Hong, PharmD, BCIDP, AAHIVP, CPP Clinical Pharmacist Practitioner Infectious Diseases [place] [facility] 10/17/2022, 11:35 AM

## 2022-10-22 DIAGNOSIS — M6259 Muscle wasting and atrophy, not elsewhere classified, multiple sites: Secondary | ICD-10-CM | POA: Diagnosis not present

## 2022-10-22 DIAGNOSIS — M1710 Unilateral primary osteoarthritis, unspecified knee: Secondary | ICD-10-CM | POA: Diagnosis not present

## 2022-10-22 DIAGNOSIS — A4901 Methicillin susceptible Staphylococcus aureus infection, unspecified site: Secondary | ICD-10-CM | POA: Diagnosis not present

## 2022-10-22 DIAGNOSIS — A498 Other bacterial infections of unspecified site: Secondary | ICD-10-CM | POA: Diagnosis not present

## 2022-10-22 DIAGNOSIS — M4326 Fusion of spine, lumbar region: Secondary | ICD-10-CM | POA: Diagnosis not present

## 2022-10-22 DIAGNOSIS — L7682 Other postprocedural complications of skin and subcutaneous tissue: Secondary | ICD-10-CM | POA: Diagnosis not present

## 2022-10-22 DIAGNOSIS — Z48811 Encounter for surgical aftercare following surgery on the nervous system: Secondary | ICD-10-CM | POA: Diagnosis not present

## 2022-10-22 DIAGNOSIS — Z981 Arthrodesis status: Secondary | ICD-10-CM | POA: Diagnosis not present

## 2022-10-22 DIAGNOSIS — R278 Other lack of coordination: Secondary | ICD-10-CM | POA: Diagnosis not present

## 2022-10-22 DIAGNOSIS — T847XXD Infection and inflammatory reaction due to other internal orthopedic prosthetic devices, implants and grafts, subsequent encounter: Secondary | ICD-10-CM | POA: Diagnosis not present

## 2022-10-22 DIAGNOSIS — R262 Difficulty in walking, not elsewhere classified: Secondary | ICD-10-CM | POA: Diagnosis not present

## 2022-10-22 DIAGNOSIS — T8142XD Infection following a procedure, deep incisional surgical site, subsequent encounter: Secondary | ICD-10-CM | POA: Diagnosis not present

## 2022-10-22 DIAGNOSIS — M6281 Muscle weakness (generalized): Secondary | ICD-10-CM | POA: Diagnosis not present

## 2022-10-22 DIAGNOSIS — R2681 Unsteadiness on feet: Secondary | ICD-10-CM | POA: Diagnosis not present

## 2022-10-23 ENCOUNTER — Ambulatory Visit: Payer: 59 | Admitting: Internal Medicine

## 2022-10-25 DIAGNOSIS — M1710 Unilateral primary osteoarthritis, unspecified knee: Secondary | ICD-10-CM | POA: Diagnosis not present

## 2022-10-29 ENCOUNTER — Other Ambulatory Visit: Payer: Self-pay

## 2022-10-29 ENCOUNTER — Ambulatory Visit (INDEPENDENT_AMBULATORY_CARE_PROVIDER_SITE_OTHER): Payer: 59 | Admitting: Internal Medicine

## 2022-10-29 ENCOUNTER — Telehealth: Payer: Self-pay

## 2022-10-29 VITALS — BP 139/74 | HR 77 | Temp 98.1°F | Ht 62.0 in | Wt 298.0 lb

## 2022-10-29 DIAGNOSIS — A498 Other bacterial infections of unspecified site: Secondary | ICD-10-CM | POA: Diagnosis not present

## 2022-10-29 DIAGNOSIS — T847XXD Infection and inflammatory reaction due to other internal orthopedic prosthetic devices, implants and grafts, subsequent encounter: Secondary | ICD-10-CM | POA: Diagnosis not present

## 2022-10-29 DIAGNOSIS — L7682 Other postprocedural complications of skin and subcutaneous tissue: Secondary | ICD-10-CM | POA: Diagnosis not present

## 2022-10-29 DIAGNOSIS — A4901 Methicillin susceptible Staphylococcus aureus infection, unspecified site: Secondary | ICD-10-CM

## 2022-10-29 NOTE — Progress Notes (Signed)
Labs drawn via PICC line per Dr. Wallace . Line flushed with 10 mL normal saline and clamped. Patient tolerated procedure well.   Yvonnia Tango D Kedrick Mcnamee, RN   

## 2022-10-29 NOTE — Progress Notes (Signed)
[facility]  CHIEF COMPLAINT:    Follow up for hardware complicating wound infection  SUBJECTIVE:    Nancy Simmons is a 67 y.o. female with PMHx as below who presents to the clinic for hardware complicating wound infection.   Patient presents today for hospital follow up.  She was admitted at [facility] from 09/21/22 - 10/01/22 for drainage from her surgical site incision.  She has prior history of L4-L5 instrumented fusion with recent extension of this fusion to L5-S1 on 09/10/2022 that was complicated by wound drainage.  She was taken to the OR on 09/24/2022 for I&D where purulent material was encountered including deep to the hardware.  Her operative cultures grew MSSA and Pseudomonas aeruginosa.  She had a PICC line placed and was discharged on cefepime through 11/05/2022.  She developed hives at her SNF a couple weeks ago which was thought to be secondary to cefepime as her symptoms improved following its discontinuation.  She was then placed on Zosyn 4.5 g IV every 6 hours and has tolerated thus far without issues.  Her rash has continued to improve per her report.  She saw Dr Lovell Sheehan for follow up on 10/05/22 and was looking/feeling much better with drainage largely resolved. She reports no current drainage and had her sutures removed as well since discharge.   Please see A&P for the details of today's visit and status of the patient's medical problems.   Patient's Medications  New Prescriptions   No medications on file  Previous Medications   ALBUTEROL (PROVENTIL) (2.5 MG/3ML) 0.083% NEBULIZER SOLUTION    Take 2.5 mg by nebulization 3 (three) times daily.   ALBUTEROL (VENTOLIN HFA) 108 (90 BASE) MCG/ACT INHALER    Inhale 1-2 puffs into the lungs every 6 (six) hours as needed for wheezing or shortness of breath.   AMLODIPINE (NORVASC) 5 MG TABLET    Take 5 mg by mouth daily.   APIXABAN (ELIQUIS) 5 MG TABS TABLET    Take 1 tablet (5 mg total) by mouth 2 (two)  times daily. Restart on 09/07/2019   CHOLINE FENOFIBRATE (TRILIPIX) 135 MG CAPSULE    Take 135 mg by mouth every evening.    CYCLOBENZAPRINE (FLEXERIL) 10 MG TABLET    Take 1 tablet (10 mg total) by mouth 3 (three) times daily as needed for muscle spasms.   DICLOFENAC SODIUM (VOLTAREN) 1 % GEL    Apply 1 application topically 4 (four) times daily as needed (for pain).   DOCUSATE SODIUM (COLACE) 100 MG CAPSULE    Take 1 capsule (100 mg total) by mouth 2 (two) times daily.   ESCITALOPRAM (LEXAPRO) 10 MG TABLET    Take 10 mg by mouth daily.   FLUTICASONE (FLONASE) 50 MCG/ACT NASAL SPRAY    Place 2 sprays into both nostrils daily.   GLYCOPYRROLATE-FORMOTEROL (BEVESPI AEROSPHERE) 9-4.8 MCG/ACT AERO    Inhale 2 puffs into the lungs 2 (two) times daily.   HYDRALAZINE (APRESOLINE) 10 MG TABLET    Take 10 mg by mouth 4 (four) times daily.   LINACLOTIDE (LINZESS) 145 MCG CAPS CAPSULE    Take 145 mcg by mouth daily before breakfast.   LIRAGLUTIDE (VICTOZA) 18 MG/3ML SOPN    Inject 1.8 mg into the skin daily.   MECLIZINE (ANTIVERT) 25 MG TABLET    Take 25 mg by mouth every 8 (eight) hours as needed for dizziness.   METOPROLOL TARTRATE (LOPRESSOR) 50 MG TABLET  Take 50 mg by mouth 2 (two) times daily.   MULTIPLE VITAMINS-MINERALS (MULTIVITAMIN GUMMIES WOMENS PO)    Take 2 each by mouth daily.   OXYCODONE 10 MG TABS    Take 1 tablet (10 mg total) by mouth every 4 (four) hours as needed for severe pain ((score 7 to 10)).   PANTOPRAZOLE (PROTONIX) 40 MG TABLET    Take 40 mg by mouth daily.   PREGABALIN (LYRICA) 100 MG CAPSULE    Take 1 capsule (100 mg total) by mouth 3 (three) times daily.   ROPINIROLE (REQUIP) 1 MG TABLET    Take 1 mg by mouth at bedtime.   ROSUVASTATIN (CRESTOR) 5 MG TABLET    Take 5 mg by mouth daily.   SUVOREXANT (BELSOMRA) 10 MG TABS    Take 10 mg by mouth at bedtime as needed (sleep).   UMECLIDINIUM-VILANTEROL (ANORO ELLIPTA) 62.5-25 MCG/ACT AEPB    Inhale 1 puff into the lungs daily.    VITAMIN D, ERGOCALCIFEROL, (DRISDOL) 1.25 MG (50000 UNIT) CAPS CAPSULE    Take 50,000 Units by mouth every 7 (seven) days. Sunday  Modified Medications   No medications on file  Discontinued Medications   CEFEPIME (MAXIPIME) IVPB    Inject 2 g into the vein every 8 (eight) hours. Indication:  Lumbar wound infection with hardware  First Dose: Yes Last Day of Therapy:  11/05/22 Labs - Once weekly:  CBC/D and BMP, Labs - Every other week:  ESR and CRP      Past Medical History:  Diagnosis Date   Arthritis    "pretty much all over" (04/03/2017)   Asthma    Autoimmune hepatitis (HCC)    COPD (chronic obstructive pulmonary disease) (HCC)    Coronary artery disease    a. mild-mod by 03/2017 following false positive nuc - She subsequently underwent cath with 30% LM, 30% ostial LAD, 45% mLAD, 50% dLAD, 30% prox RCA, no flow limiting lesions, LVEF 55-65%.    Family history of adverse reaction to anesthesia    "granddaughter has PONV"   GERD (gastroesophageal reflux disease)    High cholesterol    History of blood transfusion    "in [place]; related to knee surgeries"   History of kidney stones    Hypertension    OSA on CPAP    CPAP, pressure settings 11   Type II diabetes mellitus (HCC)     Social History   Tobacco Use   Smoking status: Former    Packs/day: 0.50    Years: 43.00    Additional pack years: 0.00    Total pack years: 21.50    Types: Cigarettes    Start date: 07/23/1972    Quit date: 12/06/2017    Years since quitting: 4.8   Smokeless tobacco: Never  Vaping Use   Vaping Use: Every day  Substance Use Topics   Alcohol use: No    Alcohol/week: 0.0 standard drinks of alcohol   Drug use: Not Currently    Types: "Crack" cocaine    Comment: Remote h/o cocaine use    Family History  Problem Relation Age of Onset   CAD Mother    Heart disease Mother        Enlarged heart   CAD Father    Diabetes Father    Heart failure Father    CAD Brother    Diabetes Brother     Heart disease Brother        Defibrillator   Colon cancer Other  Stroke Other     Allergies  Allergen Reactions   Acetaminophen Other (See Comments)    PATIENT HAS AUTOIMMUNE HEPATITIS PATIENT IS * NOT * TO RECEIVE ANY ACETAMINOPHEN   Penicillin G Rash and Other (See Comments)    Has patient had a PCN reaction causing immediate rash, facial/tongue/throat swelling, SOB or lightheadedness with hypotension: No Has patient had a PCN reaction causing severe rash involving mucus membranes or skin necrosis: No Has patient had a PCN reaction that required hospitalization: No Has patient had a PCN reaction occurring within the last 10 years: #  #  #  YES  #  #  #     Review of Systems  All other systems reviewed and are negative.  Except as noted above.   OBJECTIVE:    Vitals:   10/29/22 0852  BP: (!) 146/81  Pulse: 77  Temp: 98.1 F (36.7 C)  TempSrc: Temporal  SpO2: 98%  Weight: 298 lb (135.2 kg)  Height: 5\' 2"  (1.575 m)   Body mass index is 54.5 kg/m.  Physical Exam Constitutional:      General: She is not in acute distress.    Appearance: Normal appearance.  HENT:     Head: Normocephalic and atraumatic.  Eyes:     Extraocular Movements: Extraocular movements intact.     Conjunctiva/sclera: Conjunctivae normal.  Abdominal:     General: There is no distension.     Palpations: Abdomen is soft.  Musculoskeletal:        General: Normal range of motion.     Cervical back: Normal range of motion and neck supple.  Skin:    General: Skin is warm and dry.  Neurological:     General: No focal deficit present.     Mental Status: She is alert and oriented to person, place, and time.  Psychiatric:        Mood and Affect: Mood normal.        Behavior: Behavior normal.      Labs and Microbiology:    Latest Ref Rng & Units 09/23/2022    7:27 AM 09/21/2022    3:29 PM 09/12/2022    2:58 PM  CBC  WBC 4.0 - 10.5 K/uL 7.6  11.3  9.7   Hemoglobin 12.0 - 15.0 g/dL 9.7  91.4   78.2   Hematocrit 36.0 - 46.0 % 30.4  33.4  31.9   Platelets 150 - 400 K/uL 284  305  155       Latest Ref Rng & Units 09/23/2022    7:27 AM 09/21/2022    3:29 PM 09/12/2022    2:58 PM  CMP  Glucose 70 - 99 mg/dL 956  213  086   BUN 8 - 23 mg/dL 10  8  14    Creatinine 0.44 - 1.00 mg/dL 5.78  4.69  6.29   Sodium 135 - 145 mmol/L 138  139  140   Potassium 3.5 - 5.1 mmol/L 3.6  3.8  3.7   Chloride 98 - 111 mmol/L 110  106  107   CO2 22 - 32 mmol/L 22  24  25    Calcium 8.9 - 10.3 mg/dL 8.8  8.9  8.7      No results found for this or any previous visit (from the past 240 hour(s)).   ASSESSMENT & PLAN:    Wound infection complicating hardware [facility]) Patient here today for hospital follow-up of lumbar hardware complicating wound infection status post I&D 09/24/2022 with purulent  material tracking to the hardware and operative cultures growing MSSA and Pseudomonas aeruginosa.  She developed hives to cefepime and is currently on Zosyn which she is tolerating well.  She will complete 6 weeks of IV antibiotics on 11/05/2022.  At that time, will  ask that PICC be removed and transition to oral suppressive therapy with Levaquin 750 mg daily for Pseudomonas coverage and cefadroxil 1 g twice daily for MSSA coverage. Discussed with patient that she will likely be on suppressive therapy long term for at least 12 months if not indefinitely.  Follow-up in 6 weeks.  Check ESR, CRP, CBC, BMP.   Orders Placed This Encounter  Procedures   CBC   Basic metabolic panel    Order Specific Question:   Has the patient fasted?    Answer:   No   Sedimentation rate   C-reactive protein        Vedia CofferAndrew N Dirk [facility] 10/29/2022, 9:12 AM

## 2022-10-29 NOTE — Assessment & Plan Note (Addendum)
Patient here today for hospital follow-up of lumbar hardware complicating wound infection status post I&D 09/24/2022 with purulent material tracking to the hardware and operative cultures growing MSSA and Pseudomonas aeruginosa.  She developed hives to cefepime and is currently on Zosyn which she is tolerating well.  She will complete 6 weeks of IV antibiotics on 11/05/2022.  At that time, will  ask that PICC be removed and transition to oral suppressive therapy with Levaquin 750 mg daily for Pseudomonas coverage and cefadroxil 1 g twice daily for MSSA coverage. Discussed with patient that she will likely be on suppressive therapy long term for at least 12 months if not indefinitely.  Follow-up in 6 weeks.  Check ESR, CRP, CBC, BMP.

## 2022-10-29 NOTE — Telephone Encounter (Signed)
Per Dr. Earlene Plater, PICC line can be removed after last dose on 11/05/22. Orders sent with patient to her facility (see AVS).   Sandie Ano, RN

## 2022-10-29 NOTE — Patient Instructions (Signed)
Wound infection complicating hardware [facility]) Patient here today for hospital follow-up of lumbar hardware complicating wound infection status post I&D 09/24/2022 with purulent material tracking to the hardware and operative cultures growing MSSA and Pseudomonas aeruginosa.  She developed hives to cefepime and is currently on Zosyn which she is tolerating well.  She will complete 6 weeks of IV antibiotics on 11/05/2022.  At that time, will  ask that PICC be removed and transition to oral suppressive therapy with Levaquin 750 mg daily for Pseudomonas coverage and cefadroxil 1 g twice daily for MSSA coverage. Discussed with patient that she will likely be on suppressive therapy long term for at least 12 months if not indefinitely.  Follow-up in 6 weeks.  Check ESR, CRP, CBC, BMP.

## 2022-10-30 LAB — BASIC METABOLIC PANEL
BUN: 8 mg/dL (ref 7–25)
CO2: 24 mmol/L (ref 20–32)
Calcium: 8.8 mg/dL (ref 8.6–10.4)
Chloride: 111 mmol/L — ABNORMAL HIGH (ref 98–110)
Creat: 0.54 mg/dL (ref 0.50–1.05)
Glucose, Bld: 147 mg/dL — ABNORMAL HIGH (ref 65–99)
Potassium: 3.5 mmol/L (ref 3.5–5.3)
Sodium: 143 mmol/L (ref 135–146)

## 2022-10-30 LAB — CBC
HCT: 31.2 % — ABNORMAL LOW (ref 35.0–45.0)
Hemoglobin: 9.8 g/dL — ABNORMAL LOW (ref 11.7–15.5)
MCH: 29.2 pg (ref 27.0–33.0)
MCHC: 31.4 g/dL — ABNORMAL LOW (ref 32.0–36.0)
MCV: 92.9 fL (ref 80.0–100.0)
MPV: 12.1 fL (ref 7.5–12.5)
Platelets: 223 10*3/uL (ref 140–400)
RBC: 3.36 10*6/uL — ABNORMAL LOW (ref 3.80–5.10)
RDW: 13.3 % (ref 11.0–15.0)
WBC: 5.5 10*3/uL (ref 3.8–10.8)

## 2022-10-30 LAB — SEDIMENTATION RATE: Sed Rate: 60 mm/h — ABNORMAL HIGH (ref 0–30)

## 2022-10-30 LAB — C-REACTIVE PROTEIN: CRP: 4.4 mg/L (ref <8.0)

## 2022-11-01 DIAGNOSIS — M1710 Unilateral primary osteoarthritis, unspecified knee: Secondary | ICD-10-CM | POA: Diagnosis not present

## 2022-11-01 DIAGNOSIS — Z981 Arthrodesis status: Secondary | ICD-10-CM | POA: Diagnosis not present

## 2022-11-01 DIAGNOSIS — T8142XD Infection following a procedure, deep incisional surgical site, subsequent encounter: Secondary | ICD-10-CM | POA: Diagnosis not present

## 2022-11-05 ENCOUNTER — Other Ambulatory Visit: Payer: Self-pay | Admitting: Pharmacist

## 2022-11-05 DIAGNOSIS — T847XXD Infection and inflammatory reaction due to other internal orthopedic prosthetic devices, implants and grafts, subsequent encounter: Secondary | ICD-10-CM

## 2022-11-05 MED ORDER — LEVOFLOXACIN 750 MG PO TABS: 750.0000 mg | ORAL_TABLET | Freq: Every day | ORAL | 11 refills | Status: AC

## 2022-11-05 MED ORDER — CEFADROXIL 500 MG PO CAPS: 1000.0000 mg | ORAL_CAPSULE | Freq: Two times a day (BID) | ORAL | 11 refills | Status: AC

## 2022-11-05 NOTE — Progress Notes (Signed)
Thank you kindly. Patient updated and made aware.

## 2022-11-13 DIAGNOSIS — M1712 Unilateral primary osteoarthritis, left knee: Secondary | ICD-10-CM | POA: Diagnosis not present

## 2022-11-13 DIAGNOSIS — M159 Polyosteoarthritis, unspecified: Secondary | ICD-10-CM | POA: Diagnosis not present

## 2022-11-13 DIAGNOSIS — G4733 Obstructive sleep apnea (adult) (pediatric): Secondary | ICD-10-CM | POA: Diagnosis not present

## 2022-11-15 DIAGNOSIS — T8142XD Infection following a procedure, deep incisional surgical site, subsequent encounter: Secondary | ICD-10-CM | POA: Diagnosis not present

## 2022-11-15 DIAGNOSIS — Z981 Arthrodesis status: Secondary | ICD-10-CM | POA: Diagnosis not present

## 2022-11-15 DIAGNOSIS — Z96652 Presence of left artificial knee joint: Secondary | ICD-10-CM | POA: Diagnosis not present

## 2022-11-15 DIAGNOSIS — Z87891 Personal history of nicotine dependence: Secondary | ICD-10-CM | POA: Diagnosis not present

## 2022-11-15 DIAGNOSIS — E119 Type 2 diabetes mellitus without complications: Secondary | ICD-10-CM | POA: Diagnosis not present

## 2022-11-15 DIAGNOSIS — I10 Essential (primary) hypertension: Secondary | ICD-10-CM | POA: Diagnosis not present

## 2022-11-15 DIAGNOSIS — J449 Chronic obstructive pulmonary disease, unspecified: Secondary | ICD-10-CM | POA: Diagnosis not present

## 2022-11-15 DIAGNOSIS — K219 Gastro-esophageal reflux disease without esophagitis: Secondary | ICD-10-CM | POA: Diagnosis not present

## 2022-11-15 DIAGNOSIS — K754 Autoimmune hepatitis: Secondary | ICD-10-CM | POA: Diagnosis not present

## 2022-11-15 DIAGNOSIS — Z792 Long term (current) use of antibiotics: Secondary | ICD-10-CM | POA: Diagnosis not present

## 2022-11-15 DIAGNOSIS — Z7901 Long term (current) use of anticoagulants: Secondary | ICD-10-CM | POA: Diagnosis not present

## 2022-11-15 DIAGNOSIS — Z86718 Personal history of other venous thrombosis and embolism: Secondary | ICD-10-CM | POA: Diagnosis not present

## 2022-11-15 DIAGNOSIS — G4733 Obstructive sleep apnea (adult) (pediatric): Secondary | ICD-10-CM | POA: Diagnosis not present

## 2022-11-15 DIAGNOSIS — M1990 Unspecified osteoarthritis, unspecified site: Secondary | ICD-10-CM | POA: Diagnosis not present

## 2022-11-15 DIAGNOSIS — I25118 Atherosclerotic heart disease of native coronary artery with other forms of angina pectoris: Secondary | ICD-10-CM | POA: Diagnosis not present

## 2022-11-15 DIAGNOSIS — B9561 Methicillin susceptible Staphylococcus aureus infection as the cause of diseases classified elsewhere: Secondary | ICD-10-CM | POA: Diagnosis not present

## 2022-11-15 DIAGNOSIS — Z9989 Dependence on other enabling machines and devices: Secondary | ICD-10-CM | POA: Diagnosis not present

## 2022-11-15 DIAGNOSIS — Z7985 Long-term (current) use of injectable non-insulin antidiabetic drugs: Secondary | ICD-10-CM | POA: Diagnosis not present

## 2022-11-15 DIAGNOSIS — B965 Pseudomonas (aeruginosa) (mallei) (pseudomallei) as the cause of diseases classified elsewhere: Secondary | ICD-10-CM | POA: Diagnosis not present

## 2022-11-15 DIAGNOSIS — M6259 Muscle wasting and atrophy, not elsewhere classified, multiple sites: Secondary | ICD-10-CM | POA: Diagnosis not present

## 2022-11-16 DIAGNOSIS — M4807 Spinal stenosis, lumbosacral region: Secondary | ICD-10-CM | POA: Diagnosis not present

## 2022-11-19 DIAGNOSIS — Z9689 Presence of other specified functional implants: Secondary | ICD-10-CM | POA: Diagnosis not present

## 2022-11-19 DIAGNOSIS — Z5181 Encounter for therapeutic drug level monitoring: Secondary | ICD-10-CM | POA: Diagnosis not present

## 2022-11-19 DIAGNOSIS — Z79891 Long term (current) use of opiate analgesic: Secondary | ICD-10-CM | POA: Diagnosis not present

## 2022-11-19 DIAGNOSIS — M961 Postlaminectomy syndrome, not elsewhere classified: Secondary | ICD-10-CM | POA: Diagnosis not present

## 2022-11-19 DIAGNOSIS — M48062 Spinal stenosis, lumbar region with neurogenic claudication: Secondary | ICD-10-CM | POA: Diagnosis not present

## 2022-11-22 DIAGNOSIS — Z792 Long term (current) use of antibiotics: Secondary | ICD-10-CM | POA: Diagnosis not present

## 2022-11-22 DIAGNOSIS — J449 Chronic obstructive pulmonary disease, unspecified: Secondary | ICD-10-CM | POA: Diagnosis not present

## 2022-11-22 DIAGNOSIS — B965 Pseudomonas (aeruginosa) (mallei) (pseudomallei) as the cause of diseases classified elsewhere: Secondary | ICD-10-CM | POA: Diagnosis not present

## 2022-11-22 DIAGNOSIS — M6259 Muscle wasting and atrophy, not elsewhere classified, multiple sites: Secondary | ICD-10-CM | POA: Diagnosis not present

## 2022-11-22 DIAGNOSIS — K219 Gastro-esophageal reflux disease without esophagitis: Secondary | ICD-10-CM | POA: Diagnosis not present

## 2022-11-22 DIAGNOSIS — E119 Type 2 diabetes mellitus without complications: Secondary | ICD-10-CM | POA: Diagnosis not present

## 2022-11-22 DIAGNOSIS — B9561 Methicillin susceptible Staphylococcus aureus infection as the cause of diseases classified elsewhere: Secondary | ICD-10-CM | POA: Diagnosis not present

## 2022-11-22 DIAGNOSIS — Z7985 Long-term (current) use of injectable non-insulin antidiabetic drugs: Secondary | ICD-10-CM | POA: Diagnosis not present

## 2022-11-22 DIAGNOSIS — Z86718 Personal history of other venous thrombosis and embolism: Secondary | ICD-10-CM | POA: Diagnosis not present

## 2022-11-22 DIAGNOSIS — G4733 Obstructive sleep apnea (adult) (pediatric): Secondary | ICD-10-CM | POA: Diagnosis not present

## 2022-11-22 DIAGNOSIS — K754 Autoimmune hepatitis: Secondary | ICD-10-CM | POA: Diagnosis not present

## 2022-11-22 DIAGNOSIS — M1990 Unspecified osteoarthritis, unspecified site: Secondary | ICD-10-CM | POA: Diagnosis not present

## 2022-11-22 DIAGNOSIS — Z96652 Presence of left artificial knee joint: Secondary | ICD-10-CM | POA: Diagnosis not present

## 2022-11-22 DIAGNOSIS — I25118 Atherosclerotic heart disease of native coronary artery with other forms of angina pectoris: Secondary | ICD-10-CM | POA: Diagnosis not present

## 2022-11-22 DIAGNOSIS — T8142XD Infection following a procedure, deep incisional surgical site, subsequent encounter: Secondary | ICD-10-CM | POA: Diagnosis not present

## 2022-11-22 DIAGNOSIS — Z981 Arthrodesis status: Secondary | ICD-10-CM | POA: Diagnosis not present

## 2022-11-22 DIAGNOSIS — Z7901 Long term (current) use of anticoagulants: Secondary | ICD-10-CM | POA: Diagnosis not present

## 2022-11-22 DIAGNOSIS — Z9989 Dependence on other enabling machines and devices: Secondary | ICD-10-CM | POA: Diagnosis not present

## 2022-11-22 DIAGNOSIS — I10 Essential (primary) hypertension: Secondary | ICD-10-CM | POA: Diagnosis not present

## 2022-11-22 DIAGNOSIS — Z87891 Personal history of nicotine dependence: Secondary | ICD-10-CM | POA: Diagnosis not present

## 2022-11-27 ENCOUNTER — Encounter (HOSPITAL_COMMUNITY): Payer: Self-pay | Admitting: Neurosurgery

## 2022-11-28 DIAGNOSIS — Z87891 Personal history of nicotine dependence: Secondary | ICD-10-CM | POA: Diagnosis not present

## 2022-11-28 DIAGNOSIS — I25118 Atherosclerotic heart disease of native coronary artery with other forms of angina pectoris: Secondary | ICD-10-CM | POA: Diagnosis not present

## 2022-11-28 DIAGNOSIS — K219 Gastro-esophageal reflux disease without esophagitis: Secondary | ICD-10-CM | POA: Diagnosis not present

## 2022-11-28 DIAGNOSIS — Z86718 Personal history of other venous thrombosis and embolism: Secondary | ICD-10-CM | POA: Diagnosis not present

## 2022-11-28 DIAGNOSIS — E119 Type 2 diabetes mellitus without complications: Secondary | ICD-10-CM | POA: Diagnosis not present

## 2022-11-28 DIAGNOSIS — Z792 Long term (current) use of antibiotics: Secondary | ICD-10-CM | POA: Diagnosis not present

## 2022-11-28 DIAGNOSIS — Z7901 Long term (current) use of anticoagulants: Secondary | ICD-10-CM | POA: Diagnosis not present

## 2022-11-28 DIAGNOSIS — G4733 Obstructive sleep apnea (adult) (pediatric): Secondary | ICD-10-CM | POA: Diagnosis not present

## 2022-11-28 DIAGNOSIS — B9561 Methicillin susceptible Staphylococcus aureus infection as the cause of diseases classified elsewhere: Secondary | ICD-10-CM | POA: Diagnosis not present

## 2022-11-28 DIAGNOSIS — M1990 Unspecified osteoarthritis, unspecified site: Secondary | ICD-10-CM | POA: Diagnosis not present

## 2022-11-28 DIAGNOSIS — K754 Autoimmune hepatitis: Secondary | ICD-10-CM | POA: Diagnosis not present

## 2022-11-28 DIAGNOSIS — Z96652 Presence of left artificial knee joint: Secondary | ICD-10-CM | POA: Diagnosis not present

## 2022-11-28 DIAGNOSIS — I10 Essential (primary) hypertension: Secondary | ICD-10-CM | POA: Diagnosis not present

## 2022-11-28 DIAGNOSIS — B965 Pseudomonas (aeruginosa) (mallei) (pseudomallei) as the cause of diseases classified elsewhere: Secondary | ICD-10-CM | POA: Diagnosis not present

## 2022-11-28 DIAGNOSIS — M6259 Muscle wasting and atrophy, not elsewhere classified, multiple sites: Secondary | ICD-10-CM | POA: Diagnosis not present

## 2022-11-28 DIAGNOSIS — Z7985 Long-term (current) use of injectable non-insulin antidiabetic drugs: Secondary | ICD-10-CM | POA: Diagnosis not present

## 2022-11-28 DIAGNOSIS — Z9989 Dependence on other enabling machines and devices: Secondary | ICD-10-CM | POA: Diagnosis not present

## 2022-11-28 DIAGNOSIS — Z981 Arthrodesis status: Secondary | ICD-10-CM | POA: Diagnosis not present

## 2022-11-28 DIAGNOSIS — J449 Chronic obstructive pulmonary disease, unspecified: Secondary | ICD-10-CM | POA: Diagnosis not present

## 2022-11-28 DIAGNOSIS — T8142XD Infection following a procedure, deep incisional surgical site, subsequent encounter: Secondary | ICD-10-CM | POA: Diagnosis not present

## 2022-11-30 DIAGNOSIS — Z981 Arthrodesis status: Secondary | ICD-10-CM | POA: Diagnosis not present

## 2022-11-30 DIAGNOSIS — K219 Gastro-esophageal reflux disease without esophagitis: Secondary | ICD-10-CM | POA: Diagnosis not present

## 2022-11-30 DIAGNOSIS — E119 Type 2 diabetes mellitus without complications: Secondary | ICD-10-CM | POA: Diagnosis not present

## 2022-11-30 DIAGNOSIS — Z7901 Long term (current) use of anticoagulants: Secondary | ICD-10-CM | POA: Diagnosis not present

## 2022-11-30 DIAGNOSIS — M1990 Unspecified osteoarthritis, unspecified site: Secondary | ICD-10-CM | POA: Diagnosis not present

## 2022-11-30 DIAGNOSIS — K754 Autoimmune hepatitis: Secondary | ICD-10-CM | POA: Diagnosis not present

## 2022-11-30 DIAGNOSIS — I25118 Atherosclerotic heart disease of native coronary artery with other forms of angina pectoris: Secondary | ICD-10-CM | POA: Diagnosis not present

## 2022-11-30 DIAGNOSIS — Z96652 Presence of left artificial knee joint: Secondary | ICD-10-CM | POA: Diagnosis not present

## 2022-11-30 DIAGNOSIS — J449 Chronic obstructive pulmonary disease, unspecified: Secondary | ICD-10-CM | POA: Diagnosis not present

## 2022-11-30 DIAGNOSIS — Z7985 Long-term (current) use of injectable non-insulin antidiabetic drugs: Secondary | ICD-10-CM | POA: Diagnosis not present

## 2022-11-30 DIAGNOSIS — B9561 Methicillin susceptible Staphylococcus aureus infection as the cause of diseases classified elsewhere: Secondary | ICD-10-CM | POA: Diagnosis not present

## 2022-11-30 DIAGNOSIS — Z9989 Dependence on other enabling machines and devices: Secondary | ICD-10-CM | POA: Diagnosis not present

## 2022-11-30 DIAGNOSIS — Z86718 Personal history of other venous thrombosis and embolism: Secondary | ICD-10-CM | POA: Diagnosis not present

## 2022-11-30 DIAGNOSIS — M6259 Muscle wasting and atrophy, not elsewhere classified, multiple sites: Secondary | ICD-10-CM | POA: Diagnosis not present

## 2022-11-30 DIAGNOSIS — I10 Essential (primary) hypertension: Secondary | ICD-10-CM | POA: Diagnosis not present

## 2022-11-30 DIAGNOSIS — Z792 Long term (current) use of antibiotics: Secondary | ICD-10-CM | POA: Diagnosis not present

## 2022-11-30 DIAGNOSIS — T8142XD Infection following a procedure, deep incisional surgical site, subsequent encounter: Secondary | ICD-10-CM | POA: Diagnosis not present

## 2022-11-30 DIAGNOSIS — G4733 Obstructive sleep apnea (adult) (pediatric): Secondary | ICD-10-CM | POA: Diagnosis not present

## 2022-11-30 DIAGNOSIS — B965 Pseudomonas (aeruginosa) (mallei) (pseudomallei) as the cause of diseases classified elsewhere: Secondary | ICD-10-CM | POA: Diagnosis not present

## 2022-11-30 DIAGNOSIS — Z87891 Personal history of nicotine dependence: Secondary | ICD-10-CM | POA: Diagnosis not present

## 2022-12-03 DIAGNOSIS — M1990 Unspecified osteoarthritis, unspecified site: Secondary | ICD-10-CM | POA: Diagnosis not present

## 2022-12-03 DIAGNOSIS — Z9989 Dependence on other enabling machines and devices: Secondary | ICD-10-CM | POA: Diagnosis not present

## 2022-12-03 DIAGNOSIS — J449 Chronic obstructive pulmonary disease, unspecified: Secondary | ICD-10-CM | POA: Diagnosis not present

## 2022-12-03 DIAGNOSIS — B965 Pseudomonas (aeruginosa) (mallei) (pseudomallei) as the cause of diseases classified elsewhere: Secondary | ICD-10-CM | POA: Diagnosis not present

## 2022-12-03 DIAGNOSIS — I10 Essential (primary) hypertension: Secondary | ICD-10-CM | POA: Diagnosis not present

## 2022-12-03 DIAGNOSIS — I25118 Atherosclerotic heart disease of native coronary artery with other forms of angina pectoris: Secondary | ICD-10-CM | POA: Diagnosis not present

## 2022-12-03 DIAGNOSIS — G4733 Obstructive sleep apnea (adult) (pediatric): Secondary | ICD-10-CM | POA: Diagnosis not present

## 2022-12-03 DIAGNOSIS — E119 Type 2 diabetes mellitus without complications: Secondary | ICD-10-CM | POA: Diagnosis not present

## 2022-12-03 DIAGNOSIS — Z86718 Personal history of other venous thrombosis and embolism: Secondary | ICD-10-CM | POA: Diagnosis not present

## 2022-12-03 DIAGNOSIS — K219 Gastro-esophageal reflux disease without esophagitis: Secondary | ICD-10-CM | POA: Diagnosis not present

## 2022-12-03 DIAGNOSIS — Z981 Arthrodesis status: Secondary | ICD-10-CM | POA: Diagnosis not present

## 2022-12-03 DIAGNOSIS — Z96652 Presence of left artificial knee joint: Secondary | ICD-10-CM | POA: Diagnosis not present

## 2022-12-03 DIAGNOSIS — K754 Autoimmune hepatitis: Secondary | ICD-10-CM | POA: Diagnosis not present

## 2022-12-03 DIAGNOSIS — B9561 Methicillin susceptible Staphylococcus aureus infection as the cause of diseases classified elsewhere: Secondary | ICD-10-CM | POA: Diagnosis not present

## 2022-12-03 DIAGNOSIS — Z87891 Personal history of nicotine dependence: Secondary | ICD-10-CM | POA: Diagnosis not present

## 2022-12-03 DIAGNOSIS — T8142XD Infection following a procedure, deep incisional surgical site, subsequent encounter: Secondary | ICD-10-CM | POA: Diagnosis not present

## 2022-12-03 DIAGNOSIS — Z792 Long term (current) use of antibiotics: Secondary | ICD-10-CM | POA: Diagnosis not present

## 2022-12-03 DIAGNOSIS — Z7985 Long-term (current) use of injectable non-insulin antidiabetic drugs: Secondary | ICD-10-CM | POA: Diagnosis not present

## 2022-12-03 DIAGNOSIS — Z7901 Long term (current) use of anticoagulants: Secondary | ICD-10-CM | POA: Diagnosis not present

## 2022-12-03 DIAGNOSIS — M6259 Muscle wasting and atrophy, not elsewhere classified, multiple sites: Secondary | ICD-10-CM | POA: Diagnosis not present

## 2022-12-05 ENCOUNTER — Ambulatory Visit: Payer: 59 | Admitting: Internal Medicine

## 2022-12-05 DIAGNOSIS — Z7985 Long-term (current) use of injectable non-insulin antidiabetic drugs: Secondary | ICD-10-CM | POA: Diagnosis not present

## 2022-12-05 DIAGNOSIS — I10 Essential (primary) hypertension: Secondary | ICD-10-CM | POA: Diagnosis not present

## 2022-12-05 DIAGNOSIS — G4733 Obstructive sleep apnea (adult) (pediatric): Secondary | ICD-10-CM | POA: Diagnosis not present

## 2022-12-05 DIAGNOSIS — T8142XD Infection following a procedure, deep incisional surgical site, subsequent encounter: Secondary | ICD-10-CM | POA: Diagnosis not present

## 2022-12-05 DIAGNOSIS — Z792 Long term (current) use of antibiotics: Secondary | ICD-10-CM | POA: Diagnosis not present

## 2022-12-05 DIAGNOSIS — Z981 Arthrodesis status: Secondary | ICD-10-CM | POA: Diagnosis not present

## 2022-12-05 DIAGNOSIS — K754 Autoimmune hepatitis: Secondary | ICD-10-CM | POA: Diagnosis not present

## 2022-12-05 DIAGNOSIS — B965 Pseudomonas (aeruginosa) (mallei) (pseudomallei) as the cause of diseases classified elsewhere: Secondary | ICD-10-CM | POA: Diagnosis not present

## 2022-12-05 DIAGNOSIS — E119 Type 2 diabetes mellitus without complications: Secondary | ICD-10-CM | POA: Diagnosis not present

## 2022-12-05 DIAGNOSIS — J449 Chronic obstructive pulmonary disease, unspecified: Secondary | ICD-10-CM | POA: Diagnosis not present

## 2022-12-05 DIAGNOSIS — Z96652 Presence of left artificial knee joint: Secondary | ICD-10-CM | POA: Diagnosis not present

## 2022-12-05 DIAGNOSIS — Z86718 Personal history of other venous thrombosis and embolism: Secondary | ICD-10-CM | POA: Diagnosis not present

## 2022-12-05 DIAGNOSIS — I25118 Atherosclerotic heart disease of native coronary artery with other forms of angina pectoris: Secondary | ICD-10-CM | POA: Diagnosis not present

## 2022-12-05 DIAGNOSIS — M1990 Unspecified osteoarthritis, unspecified site: Secondary | ICD-10-CM | POA: Diagnosis not present

## 2022-12-05 DIAGNOSIS — Z7901 Long term (current) use of anticoagulants: Secondary | ICD-10-CM | POA: Diagnosis not present

## 2022-12-05 DIAGNOSIS — K219 Gastro-esophageal reflux disease without esophagitis: Secondary | ICD-10-CM | POA: Diagnosis not present

## 2022-12-05 DIAGNOSIS — Z87891 Personal history of nicotine dependence: Secondary | ICD-10-CM | POA: Diagnosis not present

## 2022-12-05 DIAGNOSIS — Z9989 Dependence on other enabling machines and devices: Secondary | ICD-10-CM | POA: Diagnosis not present

## 2022-12-05 DIAGNOSIS — M6259 Muscle wasting and atrophy, not elsewhere classified, multiple sites: Secondary | ICD-10-CM | POA: Diagnosis not present

## 2022-12-05 DIAGNOSIS — B9561 Methicillin susceptible Staphylococcus aureus infection as the cause of diseases classified elsewhere: Secondary | ICD-10-CM | POA: Diagnosis not present

## 2022-12-05 NOTE — Progress Notes (Deleted)
[facility]  CHIEF COMPLAINT:    Follow up for hardware complicating wound infection  SUBJECTIVE:    Nancy Simmons is a 67 y.o. female with PMHx as below who presents to the clinic for hardware complicating wound infection.   Patient presents today for routine follow-up.  She was last seen on 10/29/2022 by myself.  At that time, she was completing her 6 weeks of IV antibiotics which concluded on 11/05/2022.  PICC line was subsequently removed and she was placed on oral suppressive therapy with cefadroxil 1 g twice daily and Levaquin 750 mg daily.  She was previously admitted at [facility] from 09/21/2022-10/01/2022 for drainage from her surgical site incision.  She has a prior history of L4-L5 instrumented fusion with recent extension of this fusion to L5-S1 on 09/10/2022.  This was complicated by wound drainage.  She returned to the OR on 09/24/2022 for I&D where purulent material was encountered including deep to the hardware.  Her operative cultures grew MSSA and Pseudomonas aeruginosa.  She had a PICC line placed and was discharged on cefepime.  Unfortunately, she developed hives secondary to cefepime and was transitioned at that time to piperacillin/tazobactam to complete her therapy.  She tolerated this well without any side effects.  Since beginning oral antibiotics approximately 1 month ago, she reports no new issues or concerns earns.  She sees pain management as well in [place], [place] associated with [facility].  She saw them on 11/19/2022 and reported that medications were working well at the present time.  Please see A&P for the details of today's visit and status of the patient's medical problems.   Patient's Medications  New Prescriptions   No medications on file  Previous Medications   ALBUTEROL (PROVENTIL) (2.5 MG/3ML) 0.083% NEBULIZER SOLUTION    Take 2.5 mg by nebulization 3 (three) times daily.   ALBUTEROL (VENTOLIN HFA) 108 (90  BASE) MCG/ACT INHALER    Inhale 1-2 puffs into the lungs every 6 (six) hours as needed for wheezing or shortness of breath.   AMLODIPINE (NORVASC) 5 MG TABLET    Take 5 mg by mouth daily.   APIXABAN (ELIQUIS) 5 MG TABS TABLET    Take 1 tablet (5 mg total) by mouth 2 (two) times daily. Restart on 09/07/2019   CEFADROXIL (DURICEF) 500 MG CAPSULE    Take 2 capsules (1,000 mg total) by mouth 2 (two) times daily.   CHOLINE FENOFIBRATE (TRILIPIX) 135 MG CAPSULE    Take 135 mg by mouth every evening.    CYCLOBENZAPRINE (FLEXERIL) 10 MG TABLET    Take 1 tablet (10 mg total) by mouth 3 (three) times daily as needed for muscle spasms.   DICLOFENAC SODIUM (VOLTAREN) 1 % GEL    Apply 1 application topically 4 (four) times daily as needed (for pain).   DOCUSATE SODIUM (COLACE) 100 MG CAPSULE    Take 1 capsule (100 mg total) by mouth 2 (two) times daily.   ESCITALOPRAM (LEXAPRO) 10 MG TABLET    Take 10 mg by mouth daily.   FLUTICASONE (FLONASE) 50 MCG/ACT NASAL SPRAY    Place 2 sprays into both nostrils daily.   GLYCOPYRROLATE-FORMOTEROL (BEVESPI AEROSPHERE) 9-4.8 MCG/ACT AERO    Inhale 2 puffs into the lungs 2 (two) times daily.   HYDRALAZINE (APRESOLINE) 10 MG TABLET    Take 10 mg by mouth 4 (four) times daily.   LEVOFLOXACIN (LEVAQUIN) 750 MG TABLET  Take 1 tablet (750 mg total) by mouth daily.   LINACLOTIDE (LINZESS) 145 MCG CAPS CAPSULE    Take 145 mcg by mouth daily before breakfast.   LIRAGLUTIDE (VICTOZA) 18 MG/3ML SOPN    Inject 1.8 mg into the skin daily.   MECLIZINE (ANTIVERT) 25 MG TABLET    Take 25 mg by mouth every 8 (eight) hours as needed for dizziness.   METOPROLOL TARTRATE (LOPRESSOR) 50 MG TABLET    Take 50 mg by mouth 2 (two) times daily.   MULTIPLE VITAMINS-MINERALS (MULTIVITAMIN GUMMIES WOMENS PO)    Take 2 each by mouth daily.   OXYCODONE 10 MG TABS    Take 1 tablet (10 mg total) by mouth every 4 (four) hours as needed for severe pain ((score 7 to 10)).   PANTOPRAZOLE (PROTONIX) 40 MG  TABLET    Take 40 mg by mouth daily.   PREGABALIN (LYRICA) 100 MG CAPSULE    Take 1 capsule (100 mg total) by mouth 3 (three) times daily.   ROPINIROLE (REQUIP) 1 MG TABLET    Take 1 mg by mouth at bedtime.   ROSUVASTATIN (CRESTOR) 5 MG TABLET    Take 5 mg by mouth daily.   SUVOREXANT (BELSOMRA) 10 MG TABS    Take 10 mg by mouth at bedtime as needed (sleep).   UMECLIDINIUM-VILANTEROL (ANORO ELLIPTA) 62.5-25 MCG/ACT AEPB    Inhale 1 puff into the lungs daily.   VITAMIN D, ERGOCALCIFEROL, (DRISDOL) 1.25 MG (50000 UNIT) CAPS CAPSULE    Take 50,000 Units by mouth every 7 (seven) days. Sunday  Modified Medications   No medications on file  Discontinued Medications   No medications on file      Past Medical History:  Diagnosis Date   Arthritis    "pretty much all over" (04/03/2017)   Asthma    Autoimmune hepatitis (HCC)    COPD (chronic obstructive pulmonary disease) (HCC)    Coronary artery disease    a. mild-mod by 03/2017 following false positive nuc - She subsequently underwent cath with 30% LM, 30% ostial LAD, 45% mLAD, 50% dLAD, 30% prox RCA, no flow limiting lesions, LVEF 55-65%.    Family history of adverse reaction to anesthesia    "granddaughter has PONV"   GERD (gastroesophageal reflux disease)    High cholesterol    History of blood transfusion    "in [place]; related to knee surgeries"   History of kidney stones    Hypertension    OSA on CPAP    CPAP, pressure settings 11   Type II diabetes mellitus (HCC)     Social History   Tobacco Use   Smoking status: Former    Packs/day: 0.50    Years: 43.00    Additional pack years: 0.00    Total pack years: 21.50    Types: Cigarettes    Start date: 07/23/1972    Quit date: 12/06/2017    Years since quitting: 5.0   Smokeless tobacco: Never  Vaping Use   Vaping Use: Every day  Substance Use Topics   Alcohol use: No    Alcohol/week: 0.0 standard drinks of alcohol   Drug use: Not Currently    Types: "Crack" cocaine     Comment: Remote h/o cocaine use    Family History  Problem Relation Age of Onset   CAD Mother    Heart disease Mother        Enlarged heart   CAD Father    Diabetes Father  Heart failure Father    CAD Brother    Diabetes Brother    Heart disease Brother        Defibrillator   Colon cancer Other    Stroke Other     Allergies  Allergen Reactions   Acetaminophen Other (See Comments)    PATIENT HAS AUTOIMMUNE HEPATITIS PATIENT IS * NOT * TO RECEIVE ANY ACETAMINOPHEN   Penicillin G Rash and Other (See Comments)    Has patient had a PCN reaction causing immediate rash, facial/tongue/throat swelling, SOB or lightheadedness with hypotension: No Has patient had a PCN reaction causing severe rash involving mucus membranes or skin necrosis: No Has patient had a PCN reaction that required hospitalization: No Has patient had a PCN reaction occurring within the last 10 years: #  #  #  YES  #  #  #     ROS   OBJECTIVE:    There were no vitals filed for this visit. There is no height or weight on file to calculate BMI.  Physical Exam   Labs and Microbiology:    Latest Ref Rng & Units 10/29/2022    9:21 AM 09/23/2022    7:27 AM 09/21/2022    3:29 PM  CBC  WBC 3.8 - 10.8 Thousand/uL 5.5  7.6  11.3   Hemoglobin 11.7 - 15.5 g/dL 9.8  9.7  16.1   Hematocrit 35.0 - 45.0 % 31.2  30.4  33.4   Platelets 140 - 400 Thousand/uL 223  284  305       Latest Ref Rng & Units 10/29/2022    9:21 AM 09/23/2022    7:27 AM 09/21/2022    3:29 PM  CMP  Glucose 65 - 99 mg/dL 096  045  409   BUN 7 - 25 mg/dL 8  10  8    Creatinine 0.50 - 1.05 mg/dL 8.11  9.14  7.82   Sodium 135 - 146 mmol/L 143  138  139   Potassium 3.5 - 5.3 mmol/L 3.5  3.6  3.8   Chloride 98 - 110 mmol/L 111  110  106   CO2 20 - 32 mmol/L 24  22  24    Calcium 8.6 - 10.4 mg/dL 8.8  8.8  8.9       ASSESSMENT & PLAN:    No problem-specific Assessment & Plan notes found for this encounter.   No orders of the defined types were  placed in this encounter.    Patient presents for routine follow-up for lumbar hardware complicating wound infection status post I&D 09/24/2022 with purulent material tracking to the hardware and operative cultures grew MSSA and Pseudomonas aeruginosa.  She completed 6 weeks of IV antibiotics with cefepime that was then transitioned to piperacillin/tazobactam after developing hives with cefepime.  She finished her IV antibiotic course on 11/05/2022 and has since been transitioned to suppressive therapy with Levaquin 750 mg daily and cefadroxil 1 g twice daily.  She is tolerating both antibiotics at this time with no new concerning symptoms.  Discussed again with patient that she would likely be on suppressive therapy long-term given the deep hardware infection.  Ideally would at least get 12 months of therapy if not indefinitely.  Plan for follow-up again in 3 months.  Vedia Coffer for Infect[facility] Disease [facility] 12/05/2022, 12:07 PM

## 2022-12-06 DIAGNOSIS — I10 Essential (primary) hypertension: Secondary | ICD-10-CM | POA: Diagnosis not present

## 2022-12-06 DIAGNOSIS — I25118 Atherosclerotic heart disease of native coronary artery with other forms of angina pectoris: Secondary | ICD-10-CM | POA: Diagnosis not present

## 2022-12-06 DIAGNOSIS — T8142XD Infection following a procedure, deep incisional surgical site, subsequent encounter: Secondary | ICD-10-CM | POA: Diagnosis not present

## 2022-12-06 DIAGNOSIS — M6259 Muscle wasting and atrophy, not elsewhere classified, multiple sites: Secondary | ICD-10-CM | POA: Diagnosis not present

## 2022-12-06 DIAGNOSIS — Z7901 Long term (current) use of anticoagulants: Secondary | ICD-10-CM | POA: Diagnosis not present

## 2022-12-06 DIAGNOSIS — Z87891 Personal history of nicotine dependence: Secondary | ICD-10-CM | POA: Diagnosis not present

## 2022-12-06 DIAGNOSIS — Z7985 Long-term (current) use of injectable non-insulin antidiabetic drugs: Secondary | ICD-10-CM | POA: Diagnosis not present

## 2022-12-06 DIAGNOSIS — J449 Chronic obstructive pulmonary disease, unspecified: Secondary | ICD-10-CM | POA: Diagnosis not present

## 2022-12-06 DIAGNOSIS — Z96652 Presence of left artificial knee joint: Secondary | ICD-10-CM | POA: Diagnosis not present

## 2022-12-06 DIAGNOSIS — E119 Type 2 diabetes mellitus without complications: Secondary | ICD-10-CM | POA: Diagnosis not present

## 2022-12-06 DIAGNOSIS — B965 Pseudomonas (aeruginosa) (mallei) (pseudomallei) as the cause of diseases classified elsewhere: Secondary | ICD-10-CM | POA: Diagnosis not present

## 2022-12-06 DIAGNOSIS — G4733 Obstructive sleep apnea (adult) (pediatric): Secondary | ICD-10-CM | POA: Diagnosis not present

## 2022-12-06 DIAGNOSIS — K219 Gastro-esophageal reflux disease without esophagitis: Secondary | ICD-10-CM | POA: Diagnosis not present

## 2022-12-06 DIAGNOSIS — Z981 Arthrodesis status: Secondary | ICD-10-CM | POA: Diagnosis not present

## 2022-12-06 DIAGNOSIS — Z9989 Dependence on other enabling machines and devices: Secondary | ICD-10-CM | POA: Diagnosis not present

## 2022-12-06 DIAGNOSIS — B9561 Methicillin susceptible Staphylococcus aureus infection as the cause of diseases classified elsewhere: Secondary | ICD-10-CM | POA: Diagnosis not present

## 2022-12-06 DIAGNOSIS — K754 Autoimmune hepatitis: Secondary | ICD-10-CM | POA: Diagnosis not present

## 2022-12-06 DIAGNOSIS — Z792 Long term (current) use of antibiotics: Secondary | ICD-10-CM | POA: Diagnosis not present

## 2022-12-06 DIAGNOSIS — M1990 Unspecified osteoarthritis, unspecified site: Secondary | ICD-10-CM | POA: Diagnosis not present

## 2022-12-06 DIAGNOSIS — Z86718 Personal history of other venous thrombosis and embolism: Secondary | ICD-10-CM | POA: Diagnosis not present

## 2022-12-10 DIAGNOSIS — I10 Essential (primary) hypertension: Secondary | ICD-10-CM | POA: Diagnosis not present

## 2022-12-10 DIAGNOSIS — Z96652 Presence of left artificial knee joint: Secondary | ICD-10-CM | POA: Diagnosis not present

## 2022-12-10 DIAGNOSIS — Z9989 Dependence on other enabling machines and devices: Secondary | ICD-10-CM | POA: Diagnosis not present

## 2022-12-10 DIAGNOSIS — Z7901 Long term (current) use of anticoagulants: Secondary | ICD-10-CM | POA: Diagnosis not present

## 2022-12-10 DIAGNOSIS — M6259 Muscle wasting and atrophy, not elsewhere classified, multiple sites: Secondary | ICD-10-CM | POA: Diagnosis not present

## 2022-12-10 DIAGNOSIS — Z981 Arthrodesis status: Secondary | ICD-10-CM | POA: Diagnosis not present

## 2022-12-10 DIAGNOSIS — Z87891 Personal history of nicotine dependence: Secondary | ICD-10-CM | POA: Diagnosis not present

## 2022-12-10 DIAGNOSIS — G4733 Obstructive sleep apnea (adult) (pediatric): Secondary | ICD-10-CM | POA: Diagnosis not present

## 2022-12-10 DIAGNOSIS — Z792 Long term (current) use of antibiotics: Secondary | ICD-10-CM | POA: Diagnosis not present

## 2022-12-10 DIAGNOSIS — B965 Pseudomonas (aeruginosa) (mallei) (pseudomallei) as the cause of diseases classified elsewhere: Secondary | ICD-10-CM | POA: Diagnosis not present

## 2022-12-10 DIAGNOSIS — Z86718 Personal history of other venous thrombosis and embolism: Secondary | ICD-10-CM | POA: Diagnosis not present

## 2022-12-10 DIAGNOSIS — M1990 Unspecified osteoarthritis, unspecified site: Secondary | ICD-10-CM | POA: Diagnosis not present

## 2022-12-10 DIAGNOSIS — I25118 Atherosclerotic heart disease of native coronary artery with other forms of angina pectoris: Secondary | ICD-10-CM | POA: Diagnosis not present

## 2022-12-10 DIAGNOSIS — Z7985 Long-term (current) use of injectable non-insulin antidiabetic drugs: Secondary | ICD-10-CM | POA: Diagnosis not present

## 2022-12-10 DIAGNOSIS — J449 Chronic obstructive pulmonary disease, unspecified: Secondary | ICD-10-CM | POA: Diagnosis not present

## 2022-12-10 DIAGNOSIS — T8142XD Infection following a procedure, deep incisional surgical site, subsequent encounter: Secondary | ICD-10-CM | POA: Diagnosis not present

## 2022-12-10 DIAGNOSIS — E119 Type 2 diabetes mellitus without complications: Secondary | ICD-10-CM | POA: Diagnosis not present

## 2022-12-10 DIAGNOSIS — K219 Gastro-esophageal reflux disease without esophagitis: Secondary | ICD-10-CM | POA: Diagnosis not present

## 2022-12-10 DIAGNOSIS — K754 Autoimmune hepatitis: Secondary | ICD-10-CM | POA: Diagnosis not present

## 2022-12-10 DIAGNOSIS — B9561 Methicillin susceptible Staphylococcus aureus infection as the cause of diseases classified elsewhere: Secondary | ICD-10-CM | POA: Diagnosis not present

## 2022-12-12 DIAGNOSIS — E1165 Type 2 diabetes mellitus with hyperglycemia: Secondary | ICD-10-CM | POA: Diagnosis not present

## 2022-12-12 DIAGNOSIS — E785 Hyperlipidemia, unspecified: Secondary | ICD-10-CM | POA: Diagnosis not present

## 2022-12-12 DIAGNOSIS — I10 Essential (primary) hypertension: Secondary | ICD-10-CM | POA: Diagnosis not present

## 2022-12-12 DIAGNOSIS — M48061 Spinal stenosis, lumbar region without neurogenic claudication: Secondary | ICD-10-CM | POA: Diagnosis not present

## 2022-12-14 DIAGNOSIS — Z9989 Dependence on other enabling machines and devices: Secondary | ICD-10-CM | POA: Diagnosis not present

## 2022-12-14 DIAGNOSIS — Z86718 Personal history of other venous thrombosis and embolism: Secondary | ICD-10-CM | POA: Diagnosis not present

## 2022-12-14 DIAGNOSIS — K754 Autoimmune hepatitis: Secondary | ICD-10-CM | POA: Diagnosis not present

## 2022-12-14 DIAGNOSIS — Z7985 Long-term (current) use of injectable non-insulin antidiabetic drugs: Secondary | ICD-10-CM | POA: Diagnosis not present

## 2022-12-14 DIAGNOSIS — Z96652 Presence of left artificial knee joint: Secondary | ICD-10-CM | POA: Diagnosis not present

## 2022-12-14 DIAGNOSIS — E119 Type 2 diabetes mellitus without complications: Secondary | ICD-10-CM | POA: Diagnosis not present

## 2022-12-14 DIAGNOSIS — Z87891 Personal history of nicotine dependence: Secondary | ICD-10-CM | POA: Diagnosis not present

## 2022-12-14 DIAGNOSIS — M6259 Muscle wasting and atrophy, not elsewhere classified, multiple sites: Secondary | ICD-10-CM | POA: Diagnosis not present

## 2022-12-14 DIAGNOSIS — Z7901 Long term (current) use of anticoagulants: Secondary | ICD-10-CM | POA: Diagnosis not present

## 2022-12-14 DIAGNOSIS — T8142XD Infection following a procedure, deep incisional surgical site, subsequent encounter: Secondary | ICD-10-CM | POA: Diagnosis not present

## 2022-12-14 DIAGNOSIS — Z981 Arthrodesis status: Secondary | ICD-10-CM | POA: Diagnosis not present

## 2022-12-14 DIAGNOSIS — K219 Gastro-esophageal reflux disease without esophagitis: Secondary | ICD-10-CM | POA: Diagnosis not present

## 2022-12-14 DIAGNOSIS — G4733 Obstructive sleep apnea (adult) (pediatric): Secondary | ICD-10-CM | POA: Diagnosis not present

## 2022-12-14 DIAGNOSIS — I10 Essential (primary) hypertension: Secondary | ICD-10-CM | POA: Diagnosis not present

## 2022-12-14 DIAGNOSIS — Z792 Long term (current) use of antibiotics: Secondary | ICD-10-CM | POA: Diagnosis not present

## 2022-12-14 DIAGNOSIS — M1990 Unspecified osteoarthritis, unspecified site: Secondary | ICD-10-CM | POA: Diagnosis not present

## 2022-12-14 DIAGNOSIS — I25118 Atherosclerotic heart disease of native coronary artery with other forms of angina pectoris: Secondary | ICD-10-CM | POA: Diagnosis not present

## 2022-12-14 DIAGNOSIS — B965 Pseudomonas (aeruginosa) (mallei) (pseudomallei) as the cause of diseases classified elsewhere: Secondary | ICD-10-CM | POA: Diagnosis not present

## 2022-12-14 DIAGNOSIS — J449 Chronic obstructive pulmonary disease, unspecified: Secondary | ICD-10-CM | POA: Diagnosis not present

## 2022-12-14 DIAGNOSIS — B9561 Methicillin susceptible Staphylococcus aureus infection as the cause of diseases classified elsewhere: Secondary | ICD-10-CM | POA: Diagnosis not present

## 2022-12-18 DIAGNOSIS — T8142XD Infection following a procedure, deep incisional surgical site, subsequent encounter: Secondary | ICD-10-CM | POA: Diagnosis not present

## 2022-12-18 DIAGNOSIS — Z7985 Long-term (current) use of injectable non-insulin antidiabetic drugs: Secondary | ICD-10-CM | POA: Diagnosis not present

## 2022-12-18 DIAGNOSIS — B9561 Methicillin susceptible Staphylococcus aureus infection as the cause of diseases classified elsewhere: Secondary | ICD-10-CM | POA: Diagnosis not present

## 2022-12-18 DIAGNOSIS — Z9989 Dependence on other enabling machines and devices: Secondary | ICD-10-CM | POA: Diagnosis not present

## 2022-12-18 DIAGNOSIS — E119 Type 2 diabetes mellitus without complications: Secondary | ICD-10-CM | POA: Diagnosis not present

## 2022-12-18 DIAGNOSIS — Z96652 Presence of left artificial knee joint: Secondary | ICD-10-CM | POA: Diagnosis not present

## 2022-12-18 DIAGNOSIS — K754 Autoimmune hepatitis: Secondary | ICD-10-CM | POA: Diagnosis not present

## 2022-12-18 DIAGNOSIS — J449 Chronic obstructive pulmonary disease, unspecified: Secondary | ICD-10-CM | POA: Diagnosis not present

## 2022-12-18 DIAGNOSIS — M6259 Muscle wasting and atrophy, not elsewhere classified, multiple sites: Secondary | ICD-10-CM | POA: Diagnosis not present

## 2022-12-18 DIAGNOSIS — Z86718 Personal history of other venous thrombosis and embolism: Secondary | ICD-10-CM | POA: Diagnosis not present

## 2022-12-18 DIAGNOSIS — I10 Essential (primary) hypertension: Secondary | ICD-10-CM | POA: Diagnosis not present

## 2022-12-18 DIAGNOSIS — Z7901 Long term (current) use of anticoagulants: Secondary | ICD-10-CM | POA: Diagnosis not present

## 2022-12-18 DIAGNOSIS — B965 Pseudomonas (aeruginosa) (mallei) (pseudomallei) as the cause of diseases classified elsewhere: Secondary | ICD-10-CM | POA: Diagnosis not present

## 2022-12-18 DIAGNOSIS — Z792 Long term (current) use of antibiotics: Secondary | ICD-10-CM | POA: Diagnosis not present

## 2022-12-18 DIAGNOSIS — I25118 Atherosclerotic heart disease of native coronary artery with other forms of angina pectoris: Secondary | ICD-10-CM | POA: Diagnosis not present

## 2022-12-18 DIAGNOSIS — Z87891 Personal history of nicotine dependence: Secondary | ICD-10-CM | POA: Diagnosis not present

## 2022-12-18 DIAGNOSIS — K219 Gastro-esophageal reflux disease without esophagitis: Secondary | ICD-10-CM | POA: Diagnosis not present

## 2022-12-18 DIAGNOSIS — Z981 Arthrodesis status: Secondary | ICD-10-CM | POA: Diagnosis not present

## 2022-12-18 DIAGNOSIS — G4733 Obstructive sleep apnea (adult) (pediatric): Secondary | ICD-10-CM | POA: Diagnosis not present

## 2022-12-18 DIAGNOSIS — M1990 Unspecified osteoarthritis, unspecified site: Secondary | ICD-10-CM | POA: Diagnosis not present

## 2022-12-19 DIAGNOSIS — T8142XD Infection following a procedure, deep incisional surgical site, subsequent encounter: Secondary | ICD-10-CM | POA: Diagnosis not present

## 2022-12-19 DIAGNOSIS — B965 Pseudomonas (aeruginosa) (mallei) (pseudomallei) as the cause of diseases classified elsewhere: Secondary | ICD-10-CM | POA: Diagnosis not present

## 2022-12-19 DIAGNOSIS — K219 Gastro-esophageal reflux disease without esophagitis: Secondary | ICD-10-CM | POA: Diagnosis not present

## 2022-12-19 DIAGNOSIS — Z7901 Long term (current) use of anticoagulants: Secondary | ICD-10-CM | POA: Diagnosis not present

## 2022-12-19 DIAGNOSIS — I25118 Atherosclerotic heart disease of native coronary artery with other forms of angina pectoris: Secondary | ICD-10-CM | POA: Diagnosis not present

## 2022-12-19 DIAGNOSIS — J449 Chronic obstructive pulmonary disease, unspecified: Secondary | ICD-10-CM | POA: Diagnosis not present

## 2022-12-19 DIAGNOSIS — M1990 Unspecified osteoarthritis, unspecified site: Secondary | ICD-10-CM | POA: Diagnosis not present

## 2022-12-19 DIAGNOSIS — M6259 Muscle wasting and atrophy, not elsewhere classified, multiple sites: Secondary | ICD-10-CM | POA: Diagnosis not present

## 2022-12-19 DIAGNOSIS — Z7985 Long-term (current) use of injectable non-insulin antidiabetic drugs: Secondary | ICD-10-CM | POA: Diagnosis not present

## 2022-12-19 DIAGNOSIS — B9561 Methicillin susceptible Staphylococcus aureus infection as the cause of diseases classified elsewhere: Secondary | ICD-10-CM | POA: Diagnosis not present

## 2022-12-19 DIAGNOSIS — I10 Essential (primary) hypertension: Secondary | ICD-10-CM | POA: Diagnosis not present

## 2022-12-19 DIAGNOSIS — Z792 Long term (current) use of antibiotics: Secondary | ICD-10-CM | POA: Diagnosis not present

## 2022-12-19 DIAGNOSIS — Z981 Arthrodesis status: Secondary | ICD-10-CM | POA: Diagnosis not present

## 2022-12-19 DIAGNOSIS — E119 Type 2 diabetes mellitus without complications: Secondary | ICD-10-CM | POA: Diagnosis not present

## 2022-12-19 DIAGNOSIS — Z96652 Presence of left artificial knee joint: Secondary | ICD-10-CM | POA: Diagnosis not present

## 2022-12-19 DIAGNOSIS — K754 Autoimmune hepatitis: Secondary | ICD-10-CM | POA: Diagnosis not present

## 2022-12-19 DIAGNOSIS — Z87891 Personal history of nicotine dependence: Secondary | ICD-10-CM | POA: Diagnosis not present

## 2022-12-19 DIAGNOSIS — Z9989 Dependence on other enabling machines and devices: Secondary | ICD-10-CM | POA: Diagnosis not present

## 2022-12-19 DIAGNOSIS — Z86718 Personal history of other venous thrombosis and embolism: Secondary | ICD-10-CM | POA: Diagnosis not present

## 2022-12-19 DIAGNOSIS — G4733 Obstructive sleep apnea (adult) (pediatric): Secondary | ICD-10-CM | POA: Diagnosis not present

## 2022-12-24 ENCOUNTER — Ambulatory Visit: Payer: 59 | Admitting: Family

## 2022-12-24 DIAGNOSIS — K754 Autoimmune hepatitis: Secondary | ICD-10-CM | POA: Diagnosis not present

## 2022-12-24 DIAGNOSIS — B965 Pseudomonas (aeruginosa) (mallei) (pseudomallei) as the cause of diseases classified elsewhere: Secondary | ICD-10-CM | POA: Diagnosis not present

## 2022-12-24 DIAGNOSIS — Z9989 Dependence on other enabling machines and devices: Secondary | ICD-10-CM | POA: Diagnosis not present

## 2022-12-24 DIAGNOSIS — B9561 Methicillin susceptible Staphylococcus aureus infection as the cause of diseases classified elsewhere: Secondary | ICD-10-CM | POA: Diagnosis not present

## 2022-12-24 DIAGNOSIS — Z96652 Presence of left artificial knee joint: Secondary | ICD-10-CM | POA: Diagnosis not present

## 2022-12-24 DIAGNOSIS — K219 Gastro-esophageal reflux disease without esophagitis: Secondary | ICD-10-CM | POA: Diagnosis not present

## 2022-12-24 DIAGNOSIS — I25118 Atherosclerotic heart disease of native coronary artery with other forms of angina pectoris: Secondary | ICD-10-CM | POA: Diagnosis not present

## 2022-12-24 DIAGNOSIS — J449 Chronic obstructive pulmonary disease, unspecified: Secondary | ICD-10-CM | POA: Diagnosis not present

## 2022-12-24 DIAGNOSIS — I10 Essential (primary) hypertension: Secondary | ICD-10-CM | POA: Diagnosis not present

## 2022-12-24 DIAGNOSIS — M1990 Unspecified osteoarthritis, unspecified site: Secondary | ICD-10-CM | POA: Diagnosis not present

## 2022-12-24 DIAGNOSIS — G4733 Obstructive sleep apnea (adult) (pediatric): Secondary | ICD-10-CM | POA: Diagnosis not present

## 2022-12-24 DIAGNOSIS — Z86718 Personal history of other venous thrombosis and embolism: Secondary | ICD-10-CM | POA: Diagnosis not present

## 2022-12-24 DIAGNOSIS — E119 Type 2 diabetes mellitus without complications: Secondary | ICD-10-CM | POA: Diagnosis not present

## 2022-12-24 DIAGNOSIS — Z7985 Long-term (current) use of injectable non-insulin antidiabetic drugs: Secondary | ICD-10-CM | POA: Diagnosis not present

## 2022-12-24 DIAGNOSIS — T8142XD Infection following a procedure, deep incisional surgical site, subsequent encounter: Secondary | ICD-10-CM | POA: Diagnosis not present

## 2022-12-24 DIAGNOSIS — Z792 Long term (current) use of antibiotics: Secondary | ICD-10-CM | POA: Diagnosis not present

## 2022-12-24 DIAGNOSIS — Z7901 Long term (current) use of anticoagulants: Secondary | ICD-10-CM | POA: Diagnosis not present

## 2022-12-24 DIAGNOSIS — M6259 Muscle wasting and atrophy, not elsewhere classified, multiple sites: Secondary | ICD-10-CM | POA: Diagnosis not present

## 2022-12-24 DIAGNOSIS — Z981 Arthrodesis status: Secondary | ICD-10-CM | POA: Diagnosis not present

## 2022-12-24 DIAGNOSIS — Z87891 Personal history of nicotine dependence: Secondary | ICD-10-CM | POA: Diagnosis not present

## 2022-12-31 DIAGNOSIS — Z86718 Personal history of other venous thrombosis and embolism: Secondary | ICD-10-CM | POA: Diagnosis not present

## 2022-12-31 DIAGNOSIS — I25118 Atherosclerotic heart disease of native coronary artery with other forms of angina pectoris: Secondary | ICD-10-CM | POA: Diagnosis not present

## 2022-12-31 DIAGNOSIS — J449 Chronic obstructive pulmonary disease, unspecified: Secondary | ICD-10-CM | POA: Diagnosis not present

## 2022-12-31 DIAGNOSIS — G4733 Obstructive sleep apnea (adult) (pediatric): Secondary | ICD-10-CM | POA: Diagnosis not present

## 2022-12-31 DIAGNOSIS — Z96652 Presence of left artificial knee joint: Secondary | ICD-10-CM | POA: Diagnosis not present

## 2022-12-31 DIAGNOSIS — M1990 Unspecified osteoarthritis, unspecified site: Secondary | ICD-10-CM | POA: Diagnosis not present

## 2022-12-31 DIAGNOSIS — B9561 Methicillin susceptible Staphylococcus aureus infection as the cause of diseases classified elsewhere: Secondary | ICD-10-CM | POA: Diagnosis not present

## 2022-12-31 DIAGNOSIS — B965 Pseudomonas (aeruginosa) (mallei) (pseudomallei) as the cause of diseases classified elsewhere: Secondary | ICD-10-CM | POA: Diagnosis not present

## 2022-12-31 DIAGNOSIS — Z7901 Long term (current) use of anticoagulants: Secondary | ICD-10-CM | POA: Diagnosis not present

## 2022-12-31 DIAGNOSIS — Z9989 Dependence on other enabling machines and devices: Secondary | ICD-10-CM | POA: Diagnosis not present

## 2022-12-31 DIAGNOSIS — I10 Essential (primary) hypertension: Secondary | ICD-10-CM | POA: Diagnosis not present

## 2022-12-31 DIAGNOSIS — T8142XD Infection following a procedure, deep incisional surgical site, subsequent encounter: Secondary | ICD-10-CM | POA: Diagnosis not present

## 2022-12-31 DIAGNOSIS — Z7985 Long-term (current) use of injectable non-insulin antidiabetic drugs: Secondary | ICD-10-CM | POA: Diagnosis not present

## 2022-12-31 DIAGNOSIS — Z87891 Personal history of nicotine dependence: Secondary | ICD-10-CM | POA: Diagnosis not present

## 2022-12-31 DIAGNOSIS — K219 Gastro-esophageal reflux disease without esophagitis: Secondary | ICD-10-CM | POA: Diagnosis not present

## 2022-12-31 DIAGNOSIS — E119 Type 2 diabetes mellitus without complications: Secondary | ICD-10-CM | POA: Diagnosis not present

## 2022-12-31 DIAGNOSIS — M6259 Muscle wasting and atrophy, not elsewhere classified, multiple sites: Secondary | ICD-10-CM | POA: Diagnosis not present

## 2022-12-31 DIAGNOSIS — Z981 Arthrodesis status: Secondary | ICD-10-CM | POA: Diagnosis not present

## 2022-12-31 DIAGNOSIS — Z792 Long term (current) use of antibiotics: Secondary | ICD-10-CM | POA: Diagnosis not present

## 2022-12-31 DIAGNOSIS — K754 Autoimmune hepatitis: Secondary | ICD-10-CM | POA: Diagnosis not present

## 2023-01-02 DIAGNOSIS — I25118 Atherosclerotic heart disease of native coronary artery with other forms of angina pectoris: Secondary | ICD-10-CM | POA: Diagnosis not present

## 2023-01-02 DIAGNOSIS — K219 Gastro-esophageal reflux disease without esophagitis: Secondary | ICD-10-CM | POA: Diagnosis not present

## 2023-01-02 DIAGNOSIS — Z7901 Long term (current) use of anticoagulants: Secondary | ICD-10-CM | POA: Diagnosis not present

## 2023-01-02 DIAGNOSIS — Z96652 Presence of left artificial knee joint: Secondary | ICD-10-CM | POA: Diagnosis not present

## 2023-01-02 DIAGNOSIS — I10 Essential (primary) hypertension: Secondary | ICD-10-CM | POA: Diagnosis not present

## 2023-01-02 DIAGNOSIS — K754 Autoimmune hepatitis: Secondary | ICD-10-CM | POA: Diagnosis not present

## 2023-01-02 DIAGNOSIS — E119 Type 2 diabetes mellitus without complications: Secondary | ICD-10-CM | POA: Diagnosis not present

## 2023-01-02 DIAGNOSIS — Z86718 Personal history of other venous thrombosis and embolism: Secondary | ICD-10-CM | POA: Diagnosis not present

## 2023-01-02 DIAGNOSIS — Z9989 Dependence on other enabling machines and devices: Secondary | ICD-10-CM | POA: Diagnosis not present

## 2023-01-02 DIAGNOSIS — B9561 Methicillin susceptible Staphylococcus aureus infection as the cause of diseases classified elsewhere: Secondary | ICD-10-CM | POA: Diagnosis not present

## 2023-01-02 DIAGNOSIS — G4733 Obstructive sleep apnea (adult) (pediatric): Secondary | ICD-10-CM | POA: Diagnosis not present

## 2023-01-02 DIAGNOSIS — Z981 Arthrodesis status: Secondary | ICD-10-CM | POA: Diagnosis not present

## 2023-01-02 DIAGNOSIS — T8142XD Infection following a procedure, deep incisional surgical site, subsequent encounter: Secondary | ICD-10-CM | POA: Diagnosis not present

## 2023-01-02 DIAGNOSIS — Z7985 Long-term (current) use of injectable non-insulin antidiabetic drugs: Secondary | ICD-10-CM | POA: Diagnosis not present

## 2023-01-02 DIAGNOSIS — M6259 Muscle wasting and atrophy, not elsewhere classified, multiple sites: Secondary | ICD-10-CM | POA: Diagnosis not present

## 2023-01-02 DIAGNOSIS — B965 Pseudomonas (aeruginosa) (mallei) (pseudomallei) as the cause of diseases classified elsewhere: Secondary | ICD-10-CM | POA: Diagnosis not present

## 2023-01-02 DIAGNOSIS — Z792 Long term (current) use of antibiotics: Secondary | ICD-10-CM | POA: Diagnosis not present

## 2023-01-02 DIAGNOSIS — M1990 Unspecified osteoarthritis, unspecified site: Secondary | ICD-10-CM | POA: Diagnosis not present

## 2023-01-02 DIAGNOSIS — J449 Chronic obstructive pulmonary disease, unspecified: Secondary | ICD-10-CM | POA: Diagnosis not present

## 2023-01-02 DIAGNOSIS — Z87891 Personal history of nicotine dependence: Secondary | ICD-10-CM | POA: Diagnosis not present

## 2023-01-16 DIAGNOSIS — L509 Urticaria, unspecified: Secondary | ICD-10-CM | POA: Diagnosis not present

## 2023-01-21 ENCOUNTER — Ambulatory Visit: Payer: 59 | Admitting: Internal Medicine

## 2023-02-19 DIAGNOSIS — M5412 Radiculopathy, cervical region: Secondary | ICD-10-CM | POA: Diagnosis not present

## 2023-02-19 DIAGNOSIS — M4807 Spinal stenosis, lumbosacral region: Secondary | ICD-10-CM | POA: Diagnosis not present

## 2023-02-19 DIAGNOSIS — M47812 Spondylosis without myelopathy or radiculopathy, cervical region: Secondary | ICD-10-CM | POA: Diagnosis not present

## 2023-02-21 DIAGNOSIS — M48062 Spinal stenosis, lumbar region with neurogenic claudication: Secondary | ICD-10-CM | POA: Diagnosis not present

## 2023-02-21 DIAGNOSIS — G8929 Other chronic pain: Secondary | ICD-10-CM | POA: Diagnosis not present

## 2023-02-21 DIAGNOSIS — M25551 Pain in right hip: Secondary | ICD-10-CM | POA: Diagnosis not present

## 2023-02-21 DIAGNOSIS — M961 Postlaminectomy syndrome, not elsewhere classified: Secondary | ICD-10-CM | POA: Diagnosis not present

## 2023-02-21 DIAGNOSIS — Z9682 Presence of neurostimulator: Secondary | ICD-10-CM | POA: Diagnosis not present

## 2023-02-21 DIAGNOSIS — Z5181 Encounter for therapeutic drug level monitoring: Secondary | ICD-10-CM | POA: Diagnosis not present

## 2023-02-21 DIAGNOSIS — M542 Cervicalgia: Secondary | ICD-10-CM | POA: Diagnosis not present

## 2023-02-21 DIAGNOSIS — Z79891 Long term (current) use of opiate analgesic: Secondary | ICD-10-CM | POA: Diagnosis not present

## 2023-02-26 ENCOUNTER — Ambulatory Visit: Payer: 59 | Admitting: Internal Medicine

## 2023-03-01 NOTE — Progress Notes (Signed)
Sent message, via epic in basket, requesting orders in epic from surgeon.  

## 2023-03-03 NOTE — Progress Notes (Signed)
COVID Vaccine received:  []  No [x]  Yes Date of any COVID positive Test in last 90 days:  None  PCP - Junious Dresser, MD  Cardiologist - Norman Herrlich, MD   Pain Medicine - Valetta Close, FNP at [facility] in [place]   [phone] (Work) [phone] (Fax)   Chest x-ray - 09-12-2022  1v  Epic EKG -  08-02-2022   Epic Stress Test -  ECHO -  Cardiac Cath - 2018  by Dr. Herbie Baltimore,   [facility] - nonobstructive CAD  false positive stress test.   PCR screen: []  Ordered & Completed           []   No Order but Needs PROFEND           [x]   N/A for this surgery  Surgery Plan:  [x]  Ambulatory                            []  Outpatient in bed                            []  Admit  Anesthesia:    [x]  General  []  Spinal                           []   Choice []   MAC  Pacemaker / ICD device [x]  No []  Yes   Spinal Cord Stimulator:[]  No [x]  Yes    Placed approximately 11-2020, Patient is aware to bring her remote controller.      History of Sleep Apnea? []  No [x]  Yes   CPAP used?- []  No [x]  Yes    Does the patient monitor blood sugar?          []  No [x]  Yes  []  N/A  Patient has: []  NO Hx DM   []  Pre-DM                 []  DM1  [x]   DM2 Does patient have a [employer] or Dexacom? [x]  No []  Yes   Fasting Blood Sugar Ranges- 97-115 Checks Blood Sugar _1_ times a day  GLP1 agonist / usual dose - liraglutide (Victoza)   GLP1 instructions: Hold x 24 hours, last injection: 03-06-23  Blood Thinner / Instructions:  Eliquis,   Hold x 5 days per Dr. Randa Evens office. Stopped on Sunday 03-03-2023 Aspirin Instructions:  none  ERAS Protocol Ordered: [x]  No  []  Yes Patient is to be NPO after: Midnight prior  Activity level: Patient is  unable to climb a flight of stairs without difficulty; [x]  No CP but would have SOB. Patient can perform ADLs without assistance.   Anesthesia review: HTN, autoimmune hepatitis, COPD, CAD, Hx DVT, OSA-CPAP, DM2, failed back - Long term opiates,     Patient denies  shortness of breath, fever, cough and chest pain at PAT appointment.  Patient verbalized understanding and agreement to the Pre-Surgical Instructions that were given to them at this PAT appointment. Patient was also educated of the need to review these PAT instructions again prior to her surgery.I reviewed the appropriate phone numbers to call if they have any and questions or concerns.

## 2023-03-04 ENCOUNTER — Ambulatory Visit: Payer: 59 | Admitting: Internal Medicine

## 2023-03-04 NOTE — H&P (Signed)
Patient: Nancy Simmons  PID: [id number]  DOB: 12-28-1955  SEX: Not Sp   Patient referred by DDS for extraction remaining upper teeth and #19  MW:NUUV lower left  Past Medical History:  Sleep Apnea, High Blood Pressure, Asthma, CPAP, Difficult breathing, Blood Disorder, Autoimmune Hepatitis, Diabetes, Arthritis, Immune System Problem, Chronic Fatigue Syndrome, Morbid Obesity    Medications: Diclofenac, Docusate Sodium, Eliquis, Ergocalciferol, Fluticasone propionate, Hydralazine, Levofloxacin, Linzess, Meclizine, Victoza    Allergies:     Penicillin, Amoxicillin, Acetaminophen    Surgeries:   back surgery, knee replacement, hip replacement surgery     Social History       Smoking:n            Alcohol:n Drug use:n                             Exam: BMI 54. Decay and bone loss maxillary teeth # 3, 4, 5, 6, 7, 8, 9, 10, 11, 15, gross decay #19, bilateral mandibular lingual tori, right tongue lesions x 2.  No purulence, edema, fluctuance, trismus. Oral cancer screening negative. Pharynx clear. No lymphadenopathy.  Panorex: Decay and bone loss teeth # 3, 4, 5, 6, 7, 8, 9, 10, 11, 19.   Assessment: ASA 3. Non-restorable teeth  # 3, 4, 5, 6, 7, 8, 9, 10, 11, 19, bilateral mandibular lingual tori, tongue lesions right.            Plan: Extraction Teeth #  # 3, 4, 5, 6, 7, 8, 9, 10, 11, 19, alveoloplasty, removal bilateral mandibular lingual tori, excision tongue lesions right.             Hospital Day surgery.                 Rx: n               Risks and complications explained. Questions answered.   Georgia Lopes, DMD

## 2023-03-05 ENCOUNTER — Other Ambulatory Visit: Payer: Self-pay

## 2023-03-05 ENCOUNTER — Encounter (HOSPITAL_COMMUNITY)
Admission: RE | Admit: 2023-03-05 | Discharge: 2023-03-05 | Disposition: A | Discharge status: Home / Self Care | Payer: 59 | Source: Ambulatory Visit | Attending: Oral Surgery | Admitting: Oral Surgery

## 2023-03-05 ENCOUNTER — Encounter (HOSPITAL_COMMUNITY): Payer: Self-pay

## 2023-03-05 VITALS — BP 123/75 | HR 50 | Temp 98.7°F | Resp 20 | Ht 62.0 in | Wt 282.0 lb

## 2023-03-05 DIAGNOSIS — Z79891 Long term (current) use of opiate analgesic: Secondary | ICD-10-CM | POA: Insufficient documentation

## 2023-03-05 DIAGNOSIS — Z01812 Encounter for preprocedural laboratory examination: Secondary | ICD-10-CM | POA: Insufficient documentation

## 2023-03-05 DIAGNOSIS — E119 Type 2 diabetes mellitus without complications: Secondary | ICD-10-CM

## 2023-03-05 DIAGNOSIS — Z01818 Encounter for other preprocedural examination: Secondary | ICD-10-CM | POA: Diagnosis present

## 2023-03-05 HISTORY — DX: Pneumonia, unspecified organism: J18.9

## 2023-03-05 HISTORY — DX: Anxiety disorder, unspecified: F41.9

## 2023-03-05 HISTORY — DX: Anemia, unspecified: D64.9

## 2023-03-05 LAB — COMPREHENSIVE METABOLIC PANEL
ALT: 14 U/L (ref 0–44)
AST: 20 U/L (ref 15–41)
Albumin: 3.3 g/dL — ABNORMAL LOW (ref 3.5–5.0)
Alkaline Phosphatase: 69 U/L (ref 38–126)
Anion gap: 7 (ref 5–15)
BUN: 16 mg/dL (ref 8–23)
CO2: 25 mmol/L (ref 22–32)
Calcium: 9 mg/dL (ref 8.9–10.3)
Chloride: 108 mmol/L (ref 98–111)
Creatinine, Ser: 0.76 mg/dL (ref 0.44–1.00)
GFR, Estimated: >60 mL/min (ref >60)
Glucose, Bld: 113 mg/dL — ABNORMAL HIGH (ref 70–99)
Potassium: 4.2 mmol/L (ref 3.5–5.1)
Sodium: 140 mmol/L (ref 135–145)
Total Bilirubin: 0.7 mg/dL (ref 0.3–1.2)
Total Protein: 6.5 g/dL (ref 6.5–8.1)

## 2023-03-05 LAB — CBC
HCT: 38.4 % (ref 36.0–46.0)
Hemoglobin: 11.8 g/dL — ABNORMAL LOW (ref 12.0–15.0)
MCH: 29.4 pg (ref 26.0–34.0)
MCHC: 30.7 g/dL (ref 30.0–36.0)
MCV: 95.8 fL (ref 80.0–100.0)
Platelets: 173 10*3/uL (ref 150–400)
RBC: 4.01 MIL/uL (ref 3.87–5.11)
RDW: 15.1 % (ref 11.5–15.5)
WBC: 5.4 10*3/uL (ref 4.0–10.5)
nRBC: 0 % (ref 0.0–0.2)

## 2023-03-05 LAB — GLUCOSE, CAPILLARY: Glucose-Capillary: 110 mg/dL — ABNORMAL HIGH (ref 70–99)

## 2023-03-05 NOTE — Patient Instructions (Signed)
SURGICAL WAITING ROOM VISITATION Patients having surgery or a procedure may have no more than 2 support people in the waiting area - these visitors may rotate in the visitor waiting room.   Due to an increase in RSV and influenza rates and associated hospitalizations, children ages 27 and under may not visit patients in [facility] hospitals. If the patient needs to stay at the hospital during part of their recovery, the visitor guidelines for inpatient rooms apply.  PRE-OP VISITATION  Pre-op nurse will coordinate an appropriate time for 1 support person to accompany the patient in pre-op.  This support person may not rotate.  This visitor will be contacted when the time is appropriate for the visitor to come back in the pre-op area.  Please refer to the [facility] website for the visitor guidelines for Inpatients (after your surgery is over and you are in a regular room).  You are not required to quarantine at this time prior to your surgery. However, you must do this: Hand Hygiene often Do NOT share personal items Notify your provider if you are in close contact with someone who has COVID or you develop fever 100.4 or greater, new onset of sneezing, cough, sore throat, shortness of breath or body aches.  If you test positive for Covid or have been in contact with anyone that has tested positive in the last 10 days please notify you surgeon.    Your procedure is scheduled on:  Friday  March 08, 2023  Report to [facility] Main Entrance: [place] entrance where the [employer] is available.   Report to admitting at:   06:30  AM  Call this number if you have any questions or problems the morning of surgery [phone]  DO NOT EAT OR DRINK ANYTHING AFTER MIDNIGHT THE NIGHT PRIOR TO YOUR SURGERY / PROCEDURE.   FOLLOW BOWEL PREP AND ANY ADDITIONAL PRE OP INSTRUCTIONS YOU RECEIVED FROM YOUR SURGEON'S OFFICE!!!   Oral Hygiene is also important to reduce your risk of infection.         Remember - BRUSH YOUR TEETH THE MORNING OF SURGERY WITH YOUR REGULAR TOOTHPASTE  Do NOT smoke after Midnight the night before surgery.   STOP TAKING all Vitamins, Herbs and supplements 1 week before your surgery.   Liraglutide (Victoza) injections: Hold for 24 hours prior to surgery, last injection will be on 03-06-2023  ELiquis:  Stop taking this medication 5 days before your surgery. Last dose would have been taken Sunday 03-03-2023  Take ONLY these medicines the morning of surgery with A SIP OF WATER: Pantoprazole (Protonix), amlodipine, metoprolol, hydralazine, escitalopram (Lexapro), pregabalin (Lyrica), MS Contin.  You may use your Flonase nasal spray, your inhalers / nebulizers if needed. You may take Oxycodone if you have break through pain.    If You have been diagnosed with Sleep Apnea - Bring CPAP mask and tubing day of surgery. We will provide you with a CPAP machine on the day of your surgery.                   You may not have any metal on your body including hair pins, jewelry, and body piercing  Do not wear make-up, lotions, powders, perfumes / cologne, or deodorant  Do not wear nail polish including gel and S&S, artificial / acrylic nails, or any other type of covering on natural nails including finger and toenails. If you have artificial nails, gel coating, etc., that needs to be removed by  a nail salon, Please have this removed prior to surgery. Not doing so may mean that your surgery could be cancelled or delayed if the Surgeon or anesthesia staff feels like they are unable to monitor you safely.   Do not shave 48 hours prior to surgery to avoid nicks in your skin which may contribute to postoperative infections.   Men may shave face and neck.  Contacts, Hearing Aids, dentures or bridgework may not be worn into surgery. DENTURES WILL BE REMOVED PRIOR TO SURGERY PLEASE DO NOT APPLY "Poly grip" OR ADHESIVES!!!  You may bring a small overnight bag with you on the day  of surgery, only pack items that are not valuable. [facility] IS NOT RESPONSIBLE   FOR VALUABLES THAT ARE LOST OR STOLEN.   Patients discharged on the day of surgery will not be allowed to drive home.  Someone NEEDS to stay with you for the first 24 hours after anesthesia.  Do not bring your home medications to the hospital. The Pharmacy will dispense medications listed on your medication list to you during your admission in the Hospital.  Please read over the following fact sheets you were given: IF YOU HAVE QUESTIONS ABOUT YOUR PRE-OP INSTRUCTIONS, PLEASE CALL [phone].   [facility] - Preparing for Surgery Before surgery, you can play an important role.  Because skin is not sterile, your skin needs to be as free of germs as possible.  You can reduce the number of germs on your skin by washing with CHG (chlorahexidine gluconate) soap before surgery.  CHG is an antiseptic cleaner which kills germs and bonds with the skin to continue killing germs even after washing. Please DO NOT use if you have an allergy to CHG or antibacterial soaps.  If your skin becomes reddened/irritated stop using the CHG and inform your nurse when you arrive at Short Stay. Do not shave (including legs and underarms) for at least 48 hours prior to the first CHG shower.  You may shave your face/neck.  Please follow these instructions carefully:  1.  Shower with CHG Soap the night before surgery and the  morning of surgery.  2.  If you choose to wash your hair, wash your hair first as usual with your normal  shampoo.  3.  After you shampoo, rinse your hair and body thoroughly to remove the shampoo.                             4.  Use CHG as you would any other liquid soap.  You can apply chg directly to the skin and wash.  Gently with a scrungie or clean washcloth.  5.  Apply the CHG Soap to your body ONLY FROM THE NECK DOWN.   Do not use on face/ open                           Wound or open sores. Avoid contact with  eyes, ears mouth and genitals (private parts).                       Wash face,  Genitals (private parts) with your normal soap.             6.  Wash thoroughly, paying special attention to the area where your  surgery  will be performed.  7.  Thoroughly rinse your body with warm water from  the neck down.  8.  DO NOT shower/wash with your normal soap after using and rinsing off the CHG Soap.            9.  Pat yourself dry with a clean towel.            10.  Wear clean pajamas.            11.  Place clean sheets on your bed the night of your first shower and do not  sleep with pets.  ON THE DAY OF SURGERY : Do not apply any lotions/deodorants the morning of surgery.  Please wear clean clothes to the hospital/surgery center.    FAILURE TO FOLLOW THESE INSTRUCTIONS MAY RESULT IN THE CANCELLATION OF YOUR SURGERY  PATIENT SIGNATURE_________________________________  NURSE SIGNATURE__________________________________  ________________________________________________________________________

## 2023-03-08 ENCOUNTER — Encounter (HOSPITAL_COMMUNITY): Payer: Self-pay | Admitting: Oral Surgery

## 2023-03-08 ENCOUNTER — Ambulatory Visit (HOSPITAL_COMMUNITY): Payer: 59

## 2023-03-08 ENCOUNTER — Other Ambulatory Visit: Payer: Self-pay

## 2023-03-08 ENCOUNTER — Ambulatory Visit (HOSPITAL_BASED_OUTPATIENT_CLINIC_OR_DEPARTMENT_OTHER): Payer: 59

## 2023-03-08 ENCOUNTER — Ambulatory Visit (HOSPITAL_COMMUNITY)
Admission: RE | Admit: 2023-03-08 | Discharge: 2023-03-08 | Disposition: A | Discharge status: Home / Self Care | Payer: 59 | Source: Ambulatory Visit | Attending: Oral Surgery | Admitting: Oral Surgery

## 2023-03-08 ENCOUNTER — Encounter (HOSPITAL_COMMUNITY)
Admission: RE | Disposition: A | Discharge status: Home / Self Care | Payer: Self-pay | Source: Ambulatory Visit | Attending: Oral Surgery

## 2023-03-08 DIAGNOSIS — I10 Essential (primary) hypertension: Secondary | ICD-10-CM | POA: Insufficient documentation

## 2023-03-08 DIAGNOSIS — I2510 Atherosclerotic heart disease of native coronary artery without angina pectoris: Secondary | ICD-10-CM

## 2023-03-08 DIAGNOSIS — Z87891 Personal history of nicotine dependence: Secondary | ICD-10-CM

## 2023-03-08 DIAGNOSIS — Z7901 Long term (current) use of anticoagulants: Secondary | ICD-10-CM | POA: Diagnosis not present

## 2023-03-08 DIAGNOSIS — J4489 Other specified chronic obstructive pulmonary disease: Secondary | ICD-10-CM | POA: Diagnosis not present

## 2023-03-08 DIAGNOSIS — M270 Developmental disorders of jaws: Secondary | ICD-10-CM | POA: Diagnosis not present

## 2023-03-08 DIAGNOSIS — G4730 Sleep apnea, unspecified: Secondary | ICD-10-CM | POA: Insufficient documentation

## 2023-03-08 DIAGNOSIS — K149 Disease of tongue, unspecified: Secondary | ICD-10-CM | POA: Insufficient documentation

## 2023-03-08 DIAGNOSIS — K1370 Unspecified lesions of oral mucosa: Secondary | ICD-10-CM | POA: Diagnosis not present

## 2023-03-08 DIAGNOSIS — E119 Type 2 diabetes mellitus without complications: Secondary | ICD-10-CM

## 2023-03-08 DIAGNOSIS — K029 Dental caries, unspecified: Secondary | ICD-10-CM | POA: Insufficient documentation

## 2023-03-08 DIAGNOSIS — Z86718 Personal history of other venous thrombosis and embolism: Secondary | ICD-10-CM | POA: Insufficient documentation

## 2023-03-08 DIAGNOSIS — K0859 Other unsatisfactory restoration of tooth: Secondary | ICD-10-CM | POA: Diagnosis not present

## 2023-03-08 DIAGNOSIS — K140 Glossitis: Secondary | ICD-10-CM | POA: Diagnosis not present

## 2023-03-08 HISTORY — PX: TOOTH EXTRACTION: SHX859

## 2023-03-08 LAB — GLUCOSE, CAPILLARY
Glucose-Capillary: 100 mg/dL — ABNORMAL HIGH (ref 70–99)
Glucose-Capillary: 115 mg/dL — ABNORMAL HIGH (ref 70–99)

## 2023-03-08 SURGERY — DENTAL RESTORATION/EXTRACTIONS
Anesthesia: General

## 2023-03-08 MED ORDER — INSULIN ASPART 100 UNIT/ML IJ SOLN: 0.0000 [IU] | INTRAMUSCULAR | Status: DC | PRN

## 2023-03-08 MED ORDER — DEXMEDETOMIDINE HCL IN NACL 80 MCG/20ML IV SOLN
INTRAVENOUS | Status: DC | PRN
  Administered 2023-03-08: 8 ug via INTRAVENOUS

## 2023-03-08 MED ORDER — FENTANYL CITRATE PF 50 MCG/ML IJ SOSY
PREFILLED_SYRINGE | INTRAMUSCULAR | Status: AC
  Administered 2023-03-08: 25 ug via INTRAVENOUS
  Filled 2023-03-08: qty 2

## 2023-03-08 MED ORDER — ROCURONIUM BROMIDE 10 MG/ML (PF) SYRINGE
PREFILLED_SYRINGE | INTRAVENOUS | Status: AC
  Filled 2023-03-08: qty 10

## 2023-03-08 MED ORDER — ONDANSETRON HCL 4 MG/2ML IJ SOLN
INTRAMUSCULAR | Status: DC | PRN
  Administered 2023-03-08: 4 mg via INTRAVENOUS

## 2023-03-08 MED ORDER — PROPOFOL 10 MG/ML IV BOLUS
INTRAVENOUS | Status: AC
  Filled 2023-03-08: qty 20

## 2023-03-08 MED ORDER — LACTATED RINGERS IV SOLN: INTRAVENOUS | Status: DC

## 2023-03-08 MED ORDER — 0.9 % SODIUM CHLORIDE (POUR BTL) OPTIME
TOPICAL | Status: DC | PRN
  Administered 2023-03-08: 1000 mL

## 2023-03-08 MED ORDER — FENTANYL CITRATE (PF) 100 MCG/2ML IJ SOLN
INTRAMUSCULAR | Status: AC
  Filled 2023-03-08: qty 2

## 2023-03-08 MED ORDER — SUGAMMADEX SODIUM 200 MG/2ML IV SOLN
INTRAVENOUS | Status: DC | PRN
  Administered 2023-03-08: 400 mg via INTRAVENOUS

## 2023-03-08 MED ORDER — LIDOCAINE-EPINEPHRINE 2 %-1:100000 IJ SOLN
INTRAMUSCULAR | Status: AC
  Filled 2023-03-08: qty 1

## 2023-03-08 MED ORDER — DEXAMETHASONE SODIUM PHOSPHATE 10 MG/ML IJ SOLN
INTRAMUSCULAR | Status: AC
  Filled 2023-03-08: qty 1

## 2023-03-08 MED ORDER — OXYMETAZOLINE HCL 0.05 % NA SOLN
2.0000 | Freq: Two times a day (BID) | NASAL | Status: AC
  Administered 2023-03-08 (×2): 2 via NASAL
  Filled 2023-03-08: qty 30
  Filled 2023-03-08: qty 15

## 2023-03-08 MED ORDER — CLINDAMYCIN PHOSPHATE 900 MG/50ML IV SOLN
900.0000 mg | INTRAVENOUS | Status: AC
  Administered 2023-03-08: 900 mg via INTRAVENOUS
  Filled 2023-03-08: qty 50

## 2023-03-08 MED ORDER — LABETALOL HCL 5 MG/ML IV SOLN
INTRAVENOUS | Status: DC | PRN
  Administered 2023-03-08 (×4): 2.5 mg via INTRAVENOUS

## 2023-03-08 MED ORDER — CHLORHEXIDINE GLUCONATE 0.12 % MT SOLN
15.0000 mL | Freq: Once | OROMUCOSAL | Status: AC
  Administered 2023-03-08: 15 mL via OROMUCOSAL

## 2023-03-08 MED ORDER — FENTANYL CITRATE (PF) 100 MCG/2ML IJ SOLN
INTRAMUSCULAR | Status: DC | PRN
  Administered 2023-03-08: 50 ug via INTRAVENOUS

## 2023-03-08 MED ORDER — LIDOCAINE HCL (CARDIAC) PF 100 MG/5ML IV SOSY
PREFILLED_SYRINGE | INTRAVENOUS | Status: DC | PRN
  Administered 2023-03-08 (×2): 25 mg via INTRAVENOUS
  Administered 2023-03-08: 50 mg via INTRAVENOUS
  Administered 2023-03-08: 25 mg via INTRAVENOUS
  Administered 2023-03-08: 100 mg via INTRAVENOUS
  Administered 2023-03-08: 25 mg via INTRAVENOUS

## 2023-03-08 MED ORDER — LIDOCAINE HCL (PF) 2 % IJ SOLN
INTRAMUSCULAR | Status: AC
  Filled 2023-03-08: qty 5

## 2023-03-08 MED ORDER — ONDANSETRON HCL 4 MG/2ML IJ SOLN
INTRAMUSCULAR | Status: AC
  Filled 2023-03-08: qty 2

## 2023-03-08 MED ORDER — IBUPROFEN 800 MG PO TABS: 800.0000 mg | ORAL_TABLET | Freq: Three times a day (TID) | ORAL | 0 refills | Status: AC | PRN

## 2023-03-08 MED ORDER — LIDOCAINE-EPINEPHRINE 2 %-1:100000 IJ SOLN
INTRAMUSCULAR | Status: DC | PRN
  Administered 2023-03-08: 19 mL via INTRADERMAL

## 2023-03-08 MED ORDER — FENTANYL CITRATE PF 50 MCG/ML IJ SOSY
PREFILLED_SYRINGE | INTRAMUSCULAR | Status: AC
  Administered 2023-03-08: 25 ug via INTRAVENOUS
  Filled 2023-03-08: qty 1

## 2023-03-08 MED ORDER — MIDAZOLAM HCL 2 MG/2ML IJ SOLN
INTRAMUSCULAR | Status: AC
  Filled 2023-03-08: qty 2

## 2023-03-08 MED ORDER — MIDAZOLAM HCL 5 MG/5ML IJ SOLN
INTRAMUSCULAR | Status: DC | PRN
  Administered 2023-03-08 (×2): 1 mg via INTRAVENOUS

## 2023-03-08 MED ORDER — CLINDAMYCIN HCL 300 MG PO CAPS: 300.0000 mg | ORAL_CAPSULE | Freq: Three times a day (TID) | ORAL | 0 refills | Status: AC

## 2023-03-08 MED ORDER — OXYCODONE HCL 5 MG PO TABS: 5.0000 mg | ORAL_TABLET | Freq: Once | ORAL | Status: DC | PRN

## 2023-03-08 MED ORDER — ORAL CARE MOUTH RINSE: 15.0000 mL | Freq: Once | OROMUCOSAL | Status: AC

## 2023-03-08 MED ORDER — DROPERIDOL 2.5 MG/ML IJ SOLN: 0.6250 mg | Freq: Once | INTRAMUSCULAR | Status: DC | PRN

## 2023-03-08 MED ORDER — DEXAMETHASONE SODIUM PHOSPHATE 10 MG/ML IJ SOLN
INTRAMUSCULAR | Status: DC | PRN
  Administered 2023-03-08: 4 mg via INTRAVENOUS

## 2023-03-08 MED ORDER — OXYCODONE HCL 5 MG/5ML PO SOLN: 5.0000 mg | Freq: Once | ORAL | Status: DC | PRN

## 2023-03-08 MED ORDER — ROCURONIUM BROMIDE 100 MG/10ML IV SOLN
INTRAVENOUS | Status: DC | PRN
  Administered 2023-03-08: 50 mg via INTRAVENOUS
  Administered 2023-03-08: 10 mg via INTRAVENOUS

## 2023-03-08 MED ORDER — PROPOFOL 10 MG/ML IV BOLUS
INTRAVENOUS | Status: DC | PRN
  Administered 2023-03-08: 160 mg via INTRAVENOUS
  Administered 2023-03-08: 20 mg via INTRAVENOUS

## 2023-03-08 MED ORDER — SODIUM CHLORIDE 0.9 % IR SOLN
Status: DC | PRN
  Administered 2023-03-08: 1000 mL

## 2023-03-08 MED ORDER — FENTANYL CITRATE PF 50 MCG/ML IJ SOSY
25.0000 ug | PREFILLED_SYRINGE | INTRAMUSCULAR | Status: AC | PRN
  Administered 2023-03-08 (×4): 25 ug via INTRAVENOUS

## 2023-03-08 SURGICAL SUPPLY — 29 items
BAG COUNTER SPONGE SURGICOUNT (BAG) IMPLANT
BAG SPEC THK2 15X12 ZIP CLS (MISCELLANEOUS) ×1
BAG SPNG CNTER NS LX DISP (BAG)
BAG ZIPLOCK 12X15 (MISCELLANEOUS) ×1 IMPLANT
BLADE SURG 15 STRL LF DISP TIS (BLADE) ×1 IMPLANT
BLADE SURG 15 STRL SS (BLADE) ×1
BUR CROSS CUT FISSURE 1.6 (BURR) ×1 IMPLANT
BUR EGG ELITE 4.0 (BURR) ×1 IMPLANT
GAUZE 4X4 16PLY ~~LOC~~+RFID DBL (SPONGE) ×1 IMPLANT
GLOVE BIO SURGEON STRL SZ 6.5 (GLOVE) ×1 IMPLANT
GLOVE BIO SURGEON STRL SZ7.5 (GLOVE) ×1 IMPLANT
GOWN STRL REUS W/ TWL LRG LVL3 (GOWN DISPOSABLE) ×2 IMPLANT
GOWN STRL REUS W/TWL LRG LVL3 (GOWN DISPOSABLE) ×2
IV NS 1000ML (IV SOLUTION) ×1
IV NS 1000ML BAXH (IV SOLUTION) ×1 IMPLANT
KIT BASIN OR (CUSTOM PROCEDURE TRAY) ×1 IMPLANT
NDL HYPO 22X1.5 SAFETY MO (MISCELLANEOUS) ×2 IMPLANT
NEEDLE HYPO 22X1.5 SAFETY MO (MISCELLANEOUS) ×2 IMPLANT
NS IRRIG 1000ML POUR BTL (IV SOLUTION) ×1 IMPLANT
PACK EENT SPLIT (PACKS) ×1 IMPLANT
PACKING VAGINAL (PACKING) ×1 IMPLANT
PENCIL SMOKE EVACUATOR (MISCELLANEOUS) IMPLANT
SLEEVE IRRIGATION ELITE 7 (MISCELLANEOUS) IMPLANT
SPIKE FLUID TRANSFER (MISCELLANEOUS) ×1 IMPLANT
SUT CHROMIC 3 0 PS 2 (SUTURE) ×2 IMPLANT
SYR BULB EAR ULCER 3OZ GRN STR (SYRINGE) ×1 IMPLANT
SYR CONTROL 10ML LL (SYRINGE) ×2 IMPLANT
TOWEL OR 17X26 10 PK STRL BLUE (TOWEL DISPOSABLE) ×1 IMPLANT
TUBING IRRIGATION (MISCELLANEOUS) IMPLANT

## 2023-03-08 NOTE — Transfer of Care (Signed)
Immediate Anesthesia Transfer of Care Note  Patient: Nancy Simmons  Procedure(s) Performed: DENTAL RESTORATION/EXTRACTIONS;TEETH #S 3,4,5,6,7,8,9,10,11,19; ALVEOPLASTY REMOVAL BILATERAL MADIBULAR LINGUAL TORI, EXCISION TONGUE LESION RIGHT  Patient Location: PACU  Anesthesia Type:General  Level of Consciousness: awake, alert , oriented, and patient cooperative  Airway & Oxygen Therapy: Patient Spontanous Breathing and Patient connected to face mask oxygen  Post-op Assessment: Report given to RN and Post -op Vital signs reviewed and stable  Post vital signs: Reviewed and stable  Last Vitals:  Vitals Value Taken Time  BP 154/98 03/08/23 1106  Temp    Pulse 65 03/08/23 1111  Resp 24 03/08/23 1111  SpO2 94 % 03/08/23 1111  Vitals shown include unfiled device data.  Last Pain:  Vitals:   03/08/23 0733  TempSrc: Oral  PainSc: 0-No pain      Patients Stated Pain Goal: 7 (03/08/23 0733)  Complications: No notable events documented.

## 2023-03-08 NOTE — H&P (Addendum)
H&P documentation  -History and Physical Reviewed  -Patient has been re-examined  -General dentist requested extraction of teeth # 21, 22, 23, 24, 25, 26, 27, and insertion of immediate upper and lower dentures.Patient agrees with new treatment plan.  Nancy Simmons

## 2023-03-08 NOTE — Anesthesia Preprocedure Evaluation (Signed)
Anesthesia Evaluation  Patient identified by MRN, date of birth, ID band Patient awake    Reviewed: Allergy & Precautions, H&P , NPO status , Patient's Chart, lab work & pertinent test results  History of Anesthesia Complications Negative for: history of anesthetic complications  Airway Mallampati: II  TM Distance: >3 FB Neck ROM: Full    Dental no notable dental hx.    Pulmonary asthma , sleep apnea , COPD, former smoker   Pulmonary exam normal breath sounds clear to auscultation       Cardiovascular hypertension, + CAD  negative cardio ROS Normal cardiovascular exam Rhythm:Regular Rate:Normal     Neuro/Psych   Anxiety     negative neurological ROS     GI/Hepatic Neg liver ROS,GERD  ,, Autoimmune hepatitis.    Endo/Other  negative endocrine ROSdiabetes    Renal/GU negative Renal ROS  negative genitourinary   Musculoskeletal  (+) Arthritis ,    Abdominal   Peds negative pediatric ROS (+)  Hematology  (+) Blood dyscrasia, anemia Hx of DVT   Anesthesia Other Findings   Reproductive/Obstetrics negative OB ROS                             Anesthesia Physical Anesthesia Plan  ASA: 3  Anesthesia Plan: General   Post-op Pain Management:    Induction: Intravenous  PONV Risk Score and Plan: Ondansetron and Dexamethasone  Airway Management Planned: Nasal ETT  Additional Equipment:   Intra-op Plan:   Post-operative Plan: Extubation in OR  Informed Consent: I have reviewed the patients History and Physical, chart, labs and discussed the procedure including the risks, benefits and alternatives for the proposed anesthesia with the patient or authorized representative who has indicated his/her understanding and acceptance.     Dental advisory given  Plan Discussed with: CRNA  Anesthesia Plan Comments:        Anesthesia Quick Evaluation

## 2023-03-08 NOTE — Anesthesia Procedure Notes (Signed)
Procedure Name: Intubation Date/Time: 03/08/2023 9:28 AM  Performed by: Garth Bigness, CRNAPre-anesthesia Checklist: Patient identified, Emergency Drugs available, Suction available and Patient being monitored Patient Re-evaluated:Patient Re-evaluated prior to induction Oxygen Delivery Method: Circle system utilized Preoxygenation: Pre-oxygenation with 100% oxygen Induction Type: IV induction Ventilation: Mask ventilation without difficulty Laryngoscope Size: Mac and 3 Grade View: Grade II Nasal Tubes: Nasal prep performed and Nasal Rae Tube size: 6.5 mm Number of attempts: 1 Placement Confirmation: ETT inserted through vocal cords under direct vision, positive ETCO2 and breath sounds checked- equal and bilateral Secured at: 26 cm Tube secured with: Tape Dental Injury: Teeth and Oropharynx as per pre-operative assessment

## 2023-03-08 NOTE — Op Note (Signed)
03/08/2023  10:53 AM  PATIENT:  Nancy Simmons  67 y.o. female  PRE-OPERATIVE DIAGNOSIS:  NON RESTORABLE TEETH # 3, 4, 5, 6, 7, 8, 9, 10,, 11, 15, 19, 21, 22, 23, 24, 25, 26, 27, BILATERAL MANDIBULAR LINGUAL TORI, RIGHT TONGUE LESION.  POST-OPERATIVE DIAGNOSIS:  SAME  PROCEDURE:  Procedure(s): DENTAL EXTRACTIONS TEETH # 3, 4, 5, 6, 7, 8, 9, 10,, 11, 15, 19, 21, 22, 23, 24, 25, 26, 27. REMOVAL BILATERAL MANDIBULAR LINGUAL TORI, REMOVAL RIGHT TONGUE LESION, INSERION IMMEDIATE DENTURES  SURGEON:  Surgeon(s): Ocie Doyne, DMD  ANESTHESIA:   local and general  EBL:  minimal  DRAINS: none   SPECIMEN:  RIGHT LINGUAL TONGUE LESION-CLINICAL DIFFERENTIAL DIAGNOSIS: PAPILLOMA, FIBROMA  COUNTS:  YES  PLAN OF CARE: Discharge to home after PACU  PATIENT DISPOSITION:  PACU - hemodynamically stable.   PROCEDURE DETAILS: Dictation # [id number]  Nancy Simmons, DMD 03/08/2023 10:53 AM

## 2023-03-08 NOTE — Anesthesia Postprocedure Evaluation (Signed)
Anesthesia Post Note  Patient: Nancy Simmons  Procedure(s) Performed: DENTAL RESTORATION/EXTRACTIONS;TEETH #S 3,4,5,6,7,8,9,10,11,19; ALVEOPLASTY REMOVAL BILATERAL MADIBULAR LINGUAL TORI, EXCISION TONGUE LESION RIGHT     Patient location during evaluation: PACU Anesthesia Type: General Level of consciousness: awake and alert Pain management: pain level controlled Vital Signs Assessment: post-procedure vital signs reviewed and stable Respiratory status: spontaneous breathing, nonlabored ventilation, respiratory function stable and patient connected to nasal cannula oxygen Cardiovascular status: blood pressure returned to baseline and stable Postop Assessment: no apparent nausea or vomiting Anesthetic complications: no   No notable events documented.  Last Vitals:  Vitals:   03/08/23 1315 03/08/23 1330  BP: (!) 149/76 (!) 145/81  Pulse: (!) 58 (!) 58  Resp: 15 18  Temp:    SpO2: 100% 91%    Last Pain:  Vitals:   03/08/23 1200  TempSrc:   PainSc: 6                  [place]

## 2023-03-08 NOTE — Op Note (Signed)
NAMEBRAE, Nancy Simmons MEDICAL RECORD NO: [id number] ACCOUNT NO: [id number] DATE OF BIRTH: 1955/07/28 FACILITY: [facility] LOCATION: [facility] PHYSICIAN: Georgia Lopes, DDS  Operative Report   DATE OF PROCEDURE: 03/08/2023  PREOPERATIVE DIAGNOSES:  Nonrestorable teeth numbers 3, 4, 5, 6, 7, 8, 9, 10, 11, 15, 19, 21, 22, 23, 24, 25, 26, 27. Bilateral mandibular lingual tori. Right tongue lesion.  POSTOPERATIVE DIAGNOSES:  Nonrestorable teeth numbers 3, 4, 5, 6, 7, 8, 9, 10, 11, 15, 19, 21, 22, 23, 24, 25, 26, 27. Bilateral mandibular lingual tori. Right tongue lesion.  PROCEDURE:  Extraction teeth numbers 3, 4, 5, 6, 7, 8, 9, 10, 11, 15, 19, 21, 22, 23, 24, 25, 26, 27. Removal of bilateral mandibular lingual tori, removal of right tongue lesion, insertion of immediate dentures.  SURGEON:  Georgia Lopes, DDS  ANESTHESIA:  General.  Dr. Charlynn Grimes attending.  DESCRIPTION OF PROCEDURE:  The patient was taken to the operating room and placed on the table in supine position.  General anesthesia was administered.  A nasal endotracheal tube was placed and secured.  The eyes were protected and the patient was  draped for surgery.  Timeout was performed.  The posterior pharynx was suctioned and a throat pack was placed.  2% lidocaine in 1:100,000 epinephrine was infiltrated in an inferior alveolar block on the right and left sides with buccal anterior  infiltration in the mandibular mucosa and then buccal and palatal infiltration in the maxilla around the teeth to be removed.  A bite block was placed on the right side of the mouth.  A sweetheart retractor was used to retract the tongue.  A #15 blade  was used to make an incision around tooth number 19 and carried forward around teeth numbers 21, 22, 23, 24, 25 and 26 in the buccal and lingual sulcus.  The periosteum was reflected from around these teeth.  The teeth were elevated with 301 elevator and  then teeth numbers 26, 25, 24, 23, 22, 21 were removed with  Ash forceps.  Tooth number 19 fractured upon attempted removal, necessitating removal of circumferential bone and sectioning the tooth and removing the individual roots.  Then, the sockets were  curetted. Tissue was trimmed to allow for primary closure and then the tissue was reflected to expose the lingual torus.  Using a Seldin retractor, the lingual tissue was protected and then the Stryker handpiece with the egg bur under irrigation was  used to reduce the torus.  Then, the bite block was repositioned to the left side of the mouth and the right mandible was operated.  The 15 blade was used to make an incision in the molar region of the mandible on the alveolar crest, carried forward to  tooth number 27 and then was encircling the tooth in the gingival sulcus.  The periosteum was reflected.  Tooth was elevated and removed with the dental forceps.  The socket was curetted.  The tissue was trimmed to allow for primary closure.  Then, the  tissue was reflected lingually to expose the lingual torus.  The egg bur and the Stryker handpiece under irrigation was used to reduce the lingual torus.  Then, the lower denture was tried in and found to fit appropriately.  Then, the incision on the  right and left mandible was closed with 3-0 chromic.  The left maxilla was operated next.  A 15 blade was used to make an incision around tooth number 15, carried forward along the edentulous space to  tooth numbers 11, 10, 9, 8, 7, and 6 in the buccal  and palatal sulcus.  Then, the teeth were elevated. Tooth number 15 required removal of the crown with a Stryker handpiece and fissure bur and sectioning of the roots to remove the roots individually. Teeth numbers 11, 10, 9, 8 and 7 were removed with  the dental forceps.  Then, the sockets were curetted.  The tissue was trimmed and attention was turned to the right maxilla. The 15 blade was used to make an incision around teeth numbers 3, 4, 5, and 6 in the gingival sulcus of  the buccal and palatal  aspect of the teeth.  Then, the teeth were elevated, however, bone removal was necessary to allow luxation of the teeth and tooth number 3 was sectioned to remove the roots individually.  Once the maxillary teeth were removed, the sockets were curetted  and tissue was trimmed and the upper denture was placed in the mouth and found to have a good fit.  Then, the upper arch was irrigated and closed with 3-0 chromic.  Then, attention was turned to the lesion on the right lingual aspect of the lateral  tongue.  Local anesthesia was administered 2% lidocaine in 1:100,000 epinephrine.  The lesion was approximately 6 x 6 mm and was pink and slightly papillary. A 15 blade was used to make an elliptical incision around the lesion.  The lesion was removed in  one piece and then the incision was closed with 3-0 chromic interrupted sutures.  Then the oral cavity was irrigated and suctioned and the throat pack was removed.  The upper and lower dentures were placed and then the patient was left under care of  anesthesia for extubation and transported to recovery with plans for discharge home through day surgery.  ESTIMATED BLOOD LOSS:  Minimal.  COMPLICATIONS:  None.  SPECIMEN:  Right lingual tongue lesion.   VAI D: 03/08/2023 11:01:11 am T: 03/08/2023 11:14:00 am  JOB: [id number]/ [id number]

## 2023-03-09 ENCOUNTER — Encounter (HOSPITAL_COMMUNITY): Payer: Self-pay | Admitting: Oral Surgery

## 2023-03-11 LAB — SURGICAL PATHOLOGY

## 2023-03-19 ENCOUNTER — Ambulatory Visit: Payer: 59 | Admitting: Internal Medicine

## 2023-04-03 ENCOUNTER — Ambulatory Visit (INDEPENDENT_AMBULATORY_CARE_PROVIDER_SITE_OTHER): Payer: 59 | Admitting: Orthopaedic Surgery

## 2023-04-03 ENCOUNTER — Encounter: Payer: Self-pay | Admitting: Orthopaedic Surgery

## 2023-04-03 DIAGNOSIS — M7061 Trochanteric bursitis, right hip: Secondary | ICD-10-CM

## 2023-04-03 DIAGNOSIS — M25551 Pain in right hip: Secondary | ICD-10-CM

## 2023-04-03 MED ORDER — LIDOCAINE HCL 1 % IJ SOLN
3.0000 mL | INTRAMUSCULAR | Status: AC | PRN
Start: 2023-04-03 — End: 2023-04-03
  Administered 2023-04-03: 3 mL

## 2023-04-03 MED ORDER — METHYLPREDNISOLONE ACETATE 40 MG/ML IJ SUSP
40.0000 mg | INTRAMUSCULAR | Status: AC | PRN
Start: 2023-04-03 — End: 2023-04-03
  Administered 2023-04-03: 40 mg via INTRA_ARTICULAR

## 2023-04-03 NOTE — Progress Notes (Signed)
The patient is here today for a steroid injection in her right hip trochanteric area to treat the pain from trochanteric bursitis.  We have seen her in the past.  Is been a long period of time.  2 years ago we placed a steroid injection in her right hip and that helped quite a bit for her.  We have replaced her left hip back in 2019.  She is someone who is a diabetic but reports excellent control of her blood glucose.  She is also morbidly obese.  She otherwise has had no acute change in her medical status.  On exam her right hip moves smoothly and fluidly.  There is pain to palpation of the right hip trochanteric area but no groin pain today.  I did place a steroid injection of her right hip trochanteric area which she tolerated well.  If she does come back in with hip pain we would need a repeat weight and BMI calculation as well as a standing AP pelvis and lateral of her right hip.    Procedure Note  Patient: Nancy Simmons             Date of Birth: 1956-02-24           MRN: [id number]             Visit Date: 04/03/2023  Procedures: Visit Diagnoses:  1. Pain of right hip   2. Trochanteric bursitis, right hip     Large Joint Inj: R greater trochanter on 04/03/2023 3:39 PM Indications: pain and diagnostic evaluation Details: 22 G 1.5 in needle, lateral approach  Arthrogram: No  Medications: 3 mL lidocaine 1 %; 40 mg methylPREDNISolone acetate 40 MG/ML Outcome: tolerated well, no immediate complications Procedure, treatment alternatives, risks and benefits explained, specific risks discussed. Consent was given by the patient. Immediately prior to procedure a time out was called to verify the correct patient, procedure, equipment, support staff and site/side marked as required. Patient was prepped and draped in the usual sterile fashion.

## 2023-04-05 ENCOUNTER — Other Ambulatory Visit: Payer: Self-pay

## 2023-04-05 ENCOUNTER — Encounter: Payer: Self-pay | Admitting: Internal Medicine

## 2023-04-05 ENCOUNTER — Ambulatory Visit (INDEPENDENT_AMBULATORY_CARE_PROVIDER_SITE_OTHER): Payer: 59 | Admitting: Internal Medicine

## 2023-04-05 ENCOUNTER — Ambulatory Visit (INDEPENDENT_AMBULATORY_CARE_PROVIDER_SITE_OTHER): Payer: 59

## 2023-04-05 VITALS — BP 135/85 | HR 78 | Resp 16 | Ht 62.0 in | Wt 282.0 lb

## 2023-04-05 DIAGNOSIS — Z23 Encounter for immunization: Secondary | ICD-10-CM

## 2023-04-05 DIAGNOSIS — Z5181 Encounter for therapeutic drug level monitoring: Secondary | ICD-10-CM

## 2023-04-05 DIAGNOSIS — T847XXD Infection and inflammatory reaction due to other internal orthopedic prosthetic devices, implants and grafts, subsequent encounter: Secondary | ICD-10-CM

## 2023-04-05 NOTE — Assessment & Plan Note (Signed)
Will check her cmp and cbc today

## 2023-04-05 NOTE — Assessment & Plan Note (Addendum)
History reviewed and discussed with her.  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  She has continued with antibiotics and tolerating them well.  She is hopefull to get off the antibiotics at some point.  I discussed the options of prolonged (1 year) treatment vs lifelong suppression.  I discussed that there is no way to know if her infection is eliminated until you stop antibiotics.  She though prefers to consider stopping rather than taking lifelong.  Will continue through April 2025 then consider stopping then   She will follow up in 3 months  I have personally spent 45 minutes involved in face-to-face and non-face-to-face activities for this patient on the day of the visit. Professional time spent includes the following activities: Preparing to see the patient (review of tests), Obtaining and/or reviewing separately obtained history (admission/discharge record), Performing a medically appropriate examination and/or evaluation , Ordering medications/tests/procedures, referring and communicating with other health care professionals, Documenting clinical information in the EMR, Independently interpreting results (not separately reported), Communicating results to the patient/family/caregiver, Counseling and educating the patient/family/caregiver and Care coordination (not separately reported).

## 2023-04-05 NOTE — Progress Notes (Signed)
Subjective:    Patient ID: Zipporah Plants, female    DOB: 12-09-1955, 67 y.o.   MRN: [id number]  HPI Ms Koreen is here for follow up of a lumbar infection.  She previously underwent an instrumented lumbar fusion surgery followed by an extension of the fusion of L5-S1 with subsequent wound infection.  I and D done on 09/24/22 and cultures grew both MSSA and Pseudomonas. She completed prolonged IV cefepime, changed to piperacillin/tazobactam due to hives on cefepime and transitioned in April 2024 to oral levaquin + cefadroxil.     Review of Systems  Constitutional:  Negative for fever.  Gastrointestinal:  Negative for diarrhea and nausea.  Skin:  Negative for rash.       Objective:   Physical Exam Eyes:     General: No scleral icterus. Pulmonary:     Effort: Pulmonary effort is normal.  Neurological:     Mental Status: She is alert.           Assessment & Plan:

## 2023-04-06 LAB — BASIC METABOLIC PANEL
BUN: 11 mg/dL (ref 7–25)
CO2: 24 mmol/L (ref 20–32)
Calcium: 9 mg/dL (ref 8.6–10.4)
Chloride: 108 mmol/L (ref 98–110)
Creat: 0.58 mg/dL (ref 0.50–1.05)
Glucose, Bld: 108 mg/dL — ABNORMAL HIGH (ref 65–99)
Potassium: 3.8 mmol/L (ref 3.5–5.3)
Sodium: 141 mmol/L (ref 135–146)

## 2023-04-06 LAB — CBC WITH DIFFERENTIAL/PLATELET
Absolute Monocytes: 427 {cells}/uL (ref 200–950)
Basophils Absolute: 32 {cells}/uL (ref 0–200)
Basophils Relative: 0.6 %
Eosinophils Absolute: 70 {cells}/uL (ref 15–500)
Eosinophils Relative: 1.3 %
HCT: 36.8 % (ref 35.0–45.0)
Hemoglobin: 11.7 g/dL (ref 11.7–15.5)
Lymphs Abs: 2344 {cells}/uL (ref 850–3900)
MCH: 29.6 pg (ref 27.0–33.0)
MCHC: 31.8 g/dL — ABNORMAL LOW (ref 32.0–36.0)
MCV: 93.2 fL (ref 80.0–100.0)
MPV: 12.6 fL — ABNORMAL HIGH (ref 7.5–12.5)
Monocytes Relative: 7.9 %
Neutro Abs: 2527 {cells}/uL (ref 1500–7800)
Neutrophils Relative %: 46.8 %
Platelets: 166 10*3/uL (ref 140–400)
RBC: 3.95 10*6/uL (ref 3.80–5.10)
RDW: 13.1 % (ref 11.0–15.0)
Total Lymphocyte: 43.4 %
WBC: 5.4 10*3/uL (ref 3.8–10.8)

## 2023-04-06 LAB — C-REACTIVE PROTEIN: CRP: <3 mg/L (ref <8.0)

## 2023-04-06 LAB — SEDIMENTATION RATE: Sed Rate: 14 mm/h (ref 0–30)

## 2023-04-10 ENCOUNTER — Telehealth: Payer: Self-pay | Admitting: Orthopaedic Surgery

## 2023-04-10 DIAGNOSIS — Z0000 Encounter for general adult medical examination without abnormal findings: Secondary | ICD-10-CM | POA: Diagnosis not present

## 2023-04-10 DIAGNOSIS — Z9181 History of falling: Secondary | ICD-10-CM | POA: Diagnosis not present

## 2023-04-10 NOTE — Telephone Encounter (Signed)
Pt called asking if she could receive a shot in her R hip. Pt advised to give a call back please.  Nancy Simmons [phone]

## 2023-04-11 ENCOUNTER — Other Ambulatory Visit: Payer: Self-pay

## 2023-04-11 DIAGNOSIS — M25551 Pain in right hip: Secondary | ICD-10-CM

## 2023-04-11 NOTE — Telephone Encounter (Signed)
I sent a order to Dr. Alvester Morin for a hip injection

## 2023-04-16 DIAGNOSIS — E785 Hyperlipidemia, unspecified: Secondary | ICD-10-CM | POA: Diagnosis not present

## 2023-04-16 DIAGNOSIS — E1165 Type 2 diabetes mellitus with hyperglycemia: Secondary | ICD-10-CM | POA: Diagnosis not present

## 2023-04-16 DIAGNOSIS — G4700 Insomnia, unspecified: Secondary | ICD-10-CM | POA: Diagnosis not present

## 2023-04-16 DIAGNOSIS — J449 Chronic obstructive pulmonary disease, unspecified: Secondary | ICD-10-CM | POA: Diagnosis not present

## 2023-04-16 DIAGNOSIS — I10 Essential (primary) hypertension: Secondary | ICD-10-CM | POA: Diagnosis not present

## 2023-04-16 DIAGNOSIS — K754 Autoimmune hepatitis: Secondary | ICD-10-CM | POA: Diagnosis not present

## 2023-04-16 DIAGNOSIS — M48061 Spinal stenosis, lumbar region without neurogenic claudication: Secondary | ICD-10-CM | POA: Diagnosis not present

## 2023-04-22 ENCOUNTER — Telehealth: Payer: Self-pay | Admitting: Physical Medicine and Rehabilitation

## 2023-04-22 NOTE — Telephone Encounter (Signed)
Patient returned call asked for a call back to schedule an appointment with Dr. Alvester Morin for her right hip. The number to contact patient is [phone]

## 2023-04-23 NOTE — Telephone Encounter (Signed)
Spoke with patient and scheduled injection for 04/25/23.

## 2023-04-25 ENCOUNTER — Other Ambulatory Visit: Payer: Self-pay

## 2023-04-25 ENCOUNTER — Ambulatory Visit (INDEPENDENT_AMBULATORY_CARE_PROVIDER_SITE_OTHER): Payer: 59 | Admitting: Physical Medicine and Rehabilitation

## 2023-04-25 DIAGNOSIS — M25551 Pain in right hip: Secondary | ICD-10-CM | POA: Diagnosis not present

## 2023-04-25 NOTE — Progress Notes (Signed)
Nancy Simmons - 67 y.o. female MRN [id number]  Date of birth: [birth year]  Office Visit Note: Visit Date: 04/25/2023 PCP: Wilmer Floor., MD Referred by: Wilmer Floor., MD  Subjective: Chief Complaint  Patient presents with   Right Hip - Pain   HPI:  Nancy Simmons is a 67 y.o. female who comes in today at the request of Dr. Doneen Poisson for planned Right anesthetic hip arthrogram with fluoroscopic guidance.  The patient has failed conservative care including home exercise, medications, time and activity modification.  This injection will be diagnostic and hopefully therapeutic.  Please see requesting physician notes for further details and justification.   ROS Otherwise per HPI.  Assessment & Plan: Visit Diagnoses:    ICD-10-CM   1. Pain in right hip  M25.551 XR C-ARM NO REPORT    Large Joint Inj: R hip joint      Plan: No additional findings.   Meds & Orders: No orders of the defined types were placed in this encounter.   Orders Placed This Encounter  Procedures   Large Joint Inj: R hip joint   XR C-ARM NO REPORT    Follow-up: Return for visit to requesting provider as needed.   Procedures: Large Joint Inj: R hip joint on 04/25/2023 3:23 PM Indications: diagnostic evaluation and pain Details: 22 G 3.5 in needle, fluoroscopy-guided anterior approach  Arthrogram: No  Medications: 4 mL bupivacaine 0.25 %; 40 mg triamcinolone acetonide 40 MG/ML Outcome: tolerated well, no immediate complications  There was excellent flow of contrast producing a partial arthrogram of the hip. The patient did have relief of symptoms during the anesthetic phase of the injection. Procedure, treatment alternatives, risks and benefits explained, specific risks discussed. Consent was given by the patient. Immediately prior to procedure a time out was called to verify the correct patient, procedure, equipment, support staff and site/side marked as required. Patient was prepped  and draped in the usual sterile fashion.          Clinical History: No specialty comments available.     Objective:  VS:  HT:    WT:   BMI:     BP:   HR: bpm  TEMP: ( )  RESP:  Physical Exam   Imaging: No results found.

## 2023-04-25 NOTE — Progress Notes (Signed)
Functional Pain Scale - descriptive words and definitions  Unmanageable (7)  Pain interferes with normal ADL's/nothing seems to help/sleep is very difficult/active distractions are very difficult to concentrate on. Severe range order  Average Pain 10   +Driver, -BT, -Dye Allergies.  Right hip pain

## 2023-05-07 MED ORDER — BUPIVACAINE HCL 0.25 % IJ SOLN
4.0000 mL | INTRAMUSCULAR | Status: AC | PRN
Start: 2023-04-25 — End: 2023-04-25
  Administered 2023-04-25: 4 mL via INTRA_ARTICULAR

## 2023-05-07 MED ORDER — TRIAMCINOLONE ACETONIDE 40 MG/ML IJ SUSP
40.0000 mg | INTRAMUSCULAR | Status: AC | PRN
Start: 2023-04-25 — End: 2023-04-25
  Administered 2023-04-25: 40 mg via INTRA_ARTICULAR

## 2023-05-09 ENCOUNTER — Telehealth: Payer: Self-pay

## 2023-05-09 NOTE — Telephone Encounter (Signed)
Patient called with concerns regarding antibiotics. States two days ago she noticed rash on her abdomen that has now moved towards her back. Rash it itchy. Is taking benadryl which helps. Is concerned she is having reaction towards antibiotics. She has started ozempic two weeks ago and rash is close to injection site. Requested she also follow up with PCP . [name], RMA

## 2023-05-09 NOTE — Telephone Encounter (Signed)
Spoke with patient regarding provider's response. Understands that provider does not believe its related to her antbx, but could be related to Ozempic.  Will cancel appt for next week.  [name], RMA

## 2023-05-09 NOTE — Telephone Encounter (Signed)
The rash could definitely be a side effect versus reaction to Ozempic. I doubt the rash is caused by levofoxacin or cefadroxil since she has been taking them since April and seems to have been tolerating them fine. I will let Dr. Luciana Axe input as well. Nancy Simmons

## 2023-05-14 ENCOUNTER — Ambulatory Visit: Payer: 59 | Admitting: Internal Medicine

## 2023-05-23 DIAGNOSIS — Z9689 Presence of other specified functional implants: Secondary | ICD-10-CM | POA: Diagnosis not present

## 2023-05-23 DIAGNOSIS — M4306 Spondylolysis, lumbar region: Secondary | ICD-10-CM | POA: Diagnosis not present

## 2023-05-23 DIAGNOSIS — M542 Cervicalgia: Secondary | ICD-10-CM | POA: Diagnosis not present

## 2023-05-23 DIAGNOSIS — M961 Postlaminectomy syndrome, not elsewhere classified: Secondary | ICD-10-CM | POA: Diagnosis not present

## 2023-05-23 DIAGNOSIS — M25551 Pain in right hip: Secondary | ICD-10-CM | POA: Diagnosis not present

## 2023-06-11 ENCOUNTER — Telehealth: Payer: Self-pay | Admitting: Physical Medicine and Rehabilitation

## 2023-06-11 NOTE — Telephone Encounter (Signed)
Pt would like a call in regards to discussing medication to relieve pain in RT hip/groin area.

## 2023-07-09 ENCOUNTER — Ambulatory Visit: Payer: 59 | Admitting: Internal Medicine

## 2023-07-26 ENCOUNTER — Telehealth: Payer: Self-pay | Admitting: Physical Medicine and Rehabilitation

## 2023-07-26 NOTE — Telephone Encounter (Signed)
 Patient requesting an appointment for another right hip injection

## 2023-07-29 ENCOUNTER — Other Ambulatory Visit: Payer: Self-pay | Admitting: Physical Medicine and Rehabilitation

## 2023-07-29 DIAGNOSIS — M25551 Pain in right hip: Secondary | ICD-10-CM

## 2023-07-31 ENCOUNTER — Ambulatory Visit: Payer: No Typology Code available for payment source | Admitting: Internal Medicine

## 2023-07-31 ENCOUNTER — Other Ambulatory Visit: Payer: Self-pay

## 2023-07-31 ENCOUNTER — Encounter: Payer: Self-pay | Admitting: Internal Medicine

## 2023-07-31 VITALS — BP 177/81 | HR 89 | Temp 97.5°F | Ht 62.0 in | Wt 287.0 lb

## 2023-07-31 DIAGNOSIS — T847XXD Infection and inflammatory reaction due to other internal orthopedic prosthetic devices, implants and grafts, subsequent encounter: Secondary | ICD-10-CM

## 2023-07-31 NOTE — Assessment & Plan Note (Signed)
 She continues to do well in regards to her lumbar infection and tolerating her antibiotics.  This is hardware associated so have her on antibiotics for prolonged period I discussed stopping the antibiotics 1 year after starting the oral continuation treatment which will be in Apri depending on clinical progress including inflammatory markers.  Will see her in a virtual visit in 3 months  I have personally spent 31 minutes involved in face-to-face and non-face-to-face activities for this patient on the day of the visit. Professional time spent includes the following activities: Preparing to see the patient (review of tests), Obtaining and/or reviewing separately obtained history (admission/discharge record), Performing a medically appropriate examination and/or evaluation , Ordering medications/tests/procedures, referring and communicating with other health care professionals, Documenting clinical information in the EMR, Independently interpreting results (not separately reported), Communicating results to the patient/family/caregiver, Counseling and educating the patient/family/caregiver and Care coordination (not separately reported).

## 2023-07-31 NOTE — Progress Notes (Signed)
   Subjective:    Patient ID: Donia KATHEE Boyden, female    DOB: [birth year], 68 y.o.   MRN: [id number]  HPI Vi is here for follow-up of her lumbar infection. Past history: She previously underwent an instrumented lumbar fusion surgery followed by an extension of the fusion of L5-S1 with subsequent wound infection.  I and D done on 09/24/22 and cultures grew both MSSA and Pseudomonas. She completed prolonged IV cefepime , changed to piperacillin/tazobactam due to hives on cefepime  and transitioned in April 2024 to oral levaquin  + cefadroxil .   Her preference is to stop antibiotics and plan to continue through April 2025 and then consider stopping.  She has been having some hip pain though back pain has been stable.  She did develop a rash on her abdomen about 2 months ago however that did resolve after taking some Benadryl .  It has not returned.  She otherwise has no complaints.   Review of Systems  Constitutional:  Negative for fever.  Gastrointestinal:  Negative for diarrhea and nausea.  Skin:  Negative for rash.       Objective:   Physical Exam Eyes:     General: No scleral icterus. Pulmonary:     Effort: Pulmonary effort is normal.  Neurological:     Mental Status: She is alert.   SH: no tobacco        Assessment & Plan:

## 2023-08-01 LAB — BASIC METABOLIC PANEL
BUN: 12 mg/dL (ref 7–25)
CO2: 26 mmol/L (ref 20–32)
Calcium: 9.3 mg/dL (ref 8.6–10.4)
Chloride: 110 mmol/L (ref 98–110)
Creat: 0.58 mg/dL (ref 0.50–1.05)
Glucose, Bld: 94 mg/dL (ref 65–99)
Potassium: 3.5 mmol/L (ref 3.5–5.3)
Sodium: 144 mmol/L (ref 135–146)

## 2023-08-01 LAB — CBC WITH DIFFERENTIAL/PLATELET
Absolute Lymphocytes: 2584 {cells}/uL (ref 850–3900)
Absolute Monocytes: 490 {cells}/uL (ref 200–950)
Basophils Absolute: 30 {cells}/uL (ref 0–200)
Basophils Relative: 0.5 %
Eosinophils Absolute: 218 {cells}/uL (ref 15–500)
Eosinophils Relative: 3.7 %
HCT: 38.6 % (ref 35.0–45.0)
Hemoglobin: 12.2 g/dL (ref 11.7–15.5)
MCH: 29.5 pg (ref 27.0–33.0)
MCHC: 31.6 g/dL — ABNORMAL LOW (ref 32.0–36.0)
MCV: 93.5 fL (ref 80.0–100.0)
MPV: 12.2 fL (ref 7.5–12.5)
Monocytes Relative: 8.3 %
Neutro Abs: 2578 {cells}/uL (ref 1500–7800)
Neutrophils Relative %: 43.7 %
Platelets: 184 10*3/uL (ref 140–400)
RBC: 4.13 10*6/uL (ref 3.80–5.10)
RDW: 12.2 % (ref 11.0–15.0)
Total Lymphocyte: 43.8 %
WBC: 5.9 10*3/uL (ref 3.8–10.8)

## 2023-08-01 LAB — SEDIMENTATION RATE: Sed Rate: 31 mm/h — ABNORMAL HIGH (ref 0–30)

## 2023-08-01 LAB — C-REACTIVE PROTEIN: CRP: <3 mg/L (ref <8.0)

## 2023-08-12 ENCOUNTER — Ambulatory Visit (INDEPENDENT_AMBULATORY_CARE_PROVIDER_SITE_OTHER): Payer: No Typology Code available for payment source | Admitting: Physical Medicine and Rehabilitation

## 2023-08-12 ENCOUNTER — Other Ambulatory Visit: Payer: Self-pay

## 2023-08-12 DIAGNOSIS — M25551 Pain in right hip: Secondary | ICD-10-CM

## 2023-08-12 DIAGNOSIS — M1611 Unilateral primary osteoarthritis, right hip: Secondary | ICD-10-CM | POA: Diagnosis not present

## 2023-08-12 NOTE — Progress Notes (Signed)
   [name] - 68 y.o. female MRN [id number]  Date of birth: Jun 05, 1956  Office Visit Note: Visit Date: 08/12/2023 PCP: Wilmer Floor., MD Referred by: Wilmer Floor., MD  Subjective: Chief Complaint  Patient presents with   Right Hip - Pain   HPI:  Nancy Simmons is a 68 y.o. female who comes in today for planned repeat Right anesthetic hip arthrogram with fluoroscopic guidance.  The patient has failed conservative care including home exercise, medications, time and activity modification. Prior injection gave more than 50% relief for several months. This injection will be diagnostic and hopefully therapeutic.  Please see requesting physician notes for further details and justification.  Referring: Dr. Doneen Poisson   ROS Otherwise per HPI.  Assessment & Plan: Visit Diagnoses:    ICD-10-CM   1. Pain in right hip  M25.551 Large Joint Inj: R hip joint    XR C-ARM NO REPORT    2. Unilateral primary osteoarthritis, right hip  M16.11 Large Joint Inj: R hip joint    XR C-ARM NO REPORT      Plan: No additional findings.   Meds & Orders: No orders of the defined types were placed in this encounter.   Orders Placed This Encounter  Procedures   Large Joint Inj: R hip joint   XR C-ARM NO REPORT    Follow-up: Return if symptoms worsen or fail to improve.   Procedures: Large Joint Inj: R hip joint on 08/12/2023 3:45 PM Indications: diagnostic evaluation and pain Details: 22 G 3.5 in needle, fluoroscopy-guided anterior approach  Arthrogram: No  Medications: 4 mL bupivacaine 0.25 %; 40 mg triamcinolone acetonide 40 MG/ML Outcome: tolerated well, no immediate complications  There was excellent flow of contrast producing a partial arthrogram of the hip. The patient did have relief of symptoms during the anesthetic phase of the injection. Procedure, treatment alternatives, risks and benefits explained, specific risks discussed. Consent was given by the patient.  Immediately prior to procedure a time out was called to verify the correct patient, procedure, equipment, support staff and site/side marked as required. Patient was prepped and draped in the usual sterile fashion.          Clinical History: No specialty comments available.     Objective:  VS:  HT:    WT:   BMI:     BP:   HR: bpm  TEMP: ( )  RESP:  Physical Exam   Imaging: No results found.

## 2023-08-13 DIAGNOSIS — I10 Essential (primary) hypertension: Secondary | ICD-10-CM | POA: Diagnosis not present

## 2023-08-13 DIAGNOSIS — K754 Autoimmune hepatitis: Secondary | ICD-10-CM | POA: Diagnosis not present

## 2023-08-13 DIAGNOSIS — E559 Vitamin D deficiency, unspecified: Secondary | ICD-10-CM | POA: Diagnosis not present

## 2023-08-13 DIAGNOSIS — M48061 Spinal stenosis, lumbar region without neurogenic claudication: Secondary | ICD-10-CM | POA: Diagnosis not present

## 2023-08-13 DIAGNOSIS — G4700 Insomnia, unspecified: Secondary | ICD-10-CM | POA: Diagnosis not present

## 2023-08-13 DIAGNOSIS — E785 Hyperlipidemia, unspecified: Secondary | ICD-10-CM | POA: Diagnosis not present

## 2023-08-13 DIAGNOSIS — Z6841 Body Mass Index (BMI) 40.0 and over, adult: Secondary | ICD-10-CM | POA: Diagnosis not present

## 2023-08-13 DIAGNOSIS — J449 Chronic obstructive pulmonary disease, unspecified: Secondary | ICD-10-CM | POA: Diagnosis not present

## 2023-08-13 DIAGNOSIS — M159 Polyosteoarthritis, unspecified: Secondary | ICD-10-CM | POA: Diagnosis not present

## 2023-08-13 DIAGNOSIS — E1165 Type 2 diabetes mellitus with hyperglycemia: Secondary | ICD-10-CM | POA: Diagnosis not present

## 2023-08-22 DIAGNOSIS — M542 Cervicalgia: Secondary | ICD-10-CM | POA: Diagnosis not present

## 2023-08-22 DIAGNOSIS — Z79891 Long term (current) use of opiate analgesic: Secondary | ICD-10-CM | POA: Diagnosis not present

## 2023-08-22 DIAGNOSIS — M961 Postlaminectomy syndrome, not elsewhere classified: Secondary | ICD-10-CM | POA: Diagnosis not present

## 2023-08-22 DIAGNOSIS — E66813 Obesity, class 3: Secondary | ICD-10-CM | POA: Diagnosis not present

## 2023-08-22 MED ORDER — TRIAMCINOLONE ACETONIDE 40 MG/ML IJ SUSP
40.0000 mg | INTRAMUSCULAR | Status: AC | PRN
Start: 2023-08-12 — End: 2023-08-12
  Administered 2023-08-12: 40 mg via INTRA_ARTICULAR

## 2023-08-22 MED ORDER — BUPIVACAINE HCL 0.25 % IJ SOLN
4.0000 mL | INTRAMUSCULAR | Status: AC | PRN
Start: 2023-08-12 — End: 2023-08-12
  Administered 2023-08-12: 4 mL via INTRA_ARTICULAR

## 2023-09-03 DIAGNOSIS — E119 Type 2 diabetes mellitus without complications: Secondary | ICD-10-CM | POA: Diagnosis not present

## 2023-09-13 DIAGNOSIS — Z6841 Body Mass Index (BMI) 40.0 and over, adult: Secondary | ICD-10-CM | POA: Diagnosis not present

## 2023-09-13 DIAGNOSIS — R197 Diarrhea, unspecified: Secondary | ICD-10-CM | POA: Diagnosis not present

## 2023-09-13 DIAGNOSIS — Z20828 Contact with and (suspected) exposure to other viral communicable diseases: Secondary | ICD-10-CM | POA: Diagnosis not present

## 2023-09-13 DIAGNOSIS — R059 Cough, unspecified: Secondary | ICD-10-CM | POA: Diagnosis not present

## 2023-09-13 DIAGNOSIS — J398 Other specified diseases of upper respiratory tract: Secondary | ICD-10-CM | POA: Diagnosis not present

## 2023-10-22 DIAGNOSIS — M25532 Pain in left wrist: Secondary | ICD-10-CM | POA: Diagnosis not present

## 2023-10-22 DIAGNOSIS — M19132 Post-traumatic osteoarthritis, left wrist: Secondary | ICD-10-CM | POA: Diagnosis not present

## 2023-10-29 ENCOUNTER — Other Ambulatory Visit: Payer: Self-pay

## 2023-10-29 ENCOUNTER — Telehealth (INDEPENDENT_AMBULATORY_CARE_PROVIDER_SITE_OTHER): Payer: No Typology Code available for payment source | Admitting: Internal Medicine

## 2023-10-29 ENCOUNTER — Encounter: Payer: Self-pay | Admitting: Internal Medicine

## 2023-10-29 DIAGNOSIS — R197 Diarrhea, unspecified: Secondary | ICD-10-CM

## 2023-10-29 DIAGNOSIS — T847XXD Infection and inflammatory reaction due to other internal orthopedic prosthetic devices, implants and grafts, subsequent encounter: Secondary | ICD-10-CM

## 2023-11-10 NOTE — Progress Notes (Signed)
 Virtual Visit via Video Note  I connected with Nancy Simmons on 11/10/23 at 11:15 AM EDT by a video enabled telemedicine application and verified that I am speaking with the correct person using two identifiers.  Location: Patient: at home Provider: in clinic   I discussed the limitations of evaluation and management by telemedicine and the availability of in person appointments. The patient expressed understanding and agreed to proceed.  History of Present Illness: Nancy Simmons is a 68yo F with history of previously underwent an instrumented lumbar fusion surgery followed by an extension of the fusion of L5-S1 with subsequent wound infection. I and D done on 09/24/22 and cultures grew both MSSA and Pseudomonas. She completed prolonged IV cefepime , changed to piperacillin/tazobactam due to hives on cefepime  and transitioned in April 2024 to oral levaquin  + cefadroxil  through 1 year mark -- which is now April 2025. She does report in the last few days (3 days) having frequent watery diarrhea. No abdominal cramping nor any blood in stool. She is staying hydrated   Observations/Objective: Non toxic Pleasant, in no distress  Assessment and Plan: Abtx associated diarrhea vs. Other causes vs. Possibly c.difficile = will have patient stop abtx as had planned and see if diarrhea resolves. If having ongoing diarrhea in the next 2 days, we will test for cdifficile. If having perfuse diarrhea and poor oral intake, recommend to go to ED.  Hx of lumbar fusion ssi = completes prolonged suppression today. Will see back in 3 months off of abtx to evaluate how she is doing  Follow Up Instructions:  See a /p   I discussed the assessment and treatment plan with the patient. The patient was provided an opportunity to ask questions and all were answered. The patient agreed with the plan and demonstrated an understanding of the instructions.   The patient was advised to call back or seek an in-person evaluation if the  symptoms worsen or if the condition fails to improve as anticipated.  I have personally spent 30 minutes involved in face-to-face and non-face-to-face activities for this patient on the day of the visit. Professional time spent includes the following activities: Preparing to see the patient (review of tests), Obtaining and/or reviewing separately obtained history (admission/discharge record), Performing a medically appropriate examination and/or evaluation , Ordering medications/tests/procedures, referring and communicating with other health care professionals, Documenting clinical information in the EMR, Independently interpreting results (not separately reported), Communicating results to the patient/family/caregiver, Counseling and educating the patient/family/caregiver and Care coordination (not separately reported).     Liane Redman, MD

## 2023-11-19 ENCOUNTER — Telehealth: Payer: Self-pay | Admitting: Physical Medicine and Rehabilitation

## 2023-11-19 DIAGNOSIS — M25551 Pain in right hip: Secondary | ICD-10-CM

## 2023-11-19 NOTE — Telephone Encounter (Signed)
 Pt called requesting a call to set an appt wit [facility]. Please call pt at [phone].

## 2023-11-20 DIAGNOSIS — M961 Postlaminectomy syndrome, not elsewhere classified: Secondary | ICD-10-CM | POA: Diagnosis not present

## 2023-11-20 DIAGNOSIS — M542 Cervicalgia: Secondary | ICD-10-CM | POA: Diagnosis not present

## 2023-11-20 DIAGNOSIS — Z5181 Encounter for therapeutic drug level monitoring: Secondary | ICD-10-CM | POA: Diagnosis not present

## 2023-11-27 DIAGNOSIS — Z7901 Long term (current) use of anticoagulants: Secondary | ICD-10-CM | POA: Diagnosis not present

## 2023-11-27 DIAGNOSIS — K208 Other esophagitis without bleeding: Secondary | ICD-10-CM | POA: Diagnosis not present

## 2023-11-27 DIAGNOSIS — K2100 Gastro-esophageal reflux disease with esophagitis, without bleeding: Secondary | ICD-10-CM | POA: Diagnosis not present

## 2023-11-27 DIAGNOSIS — R109 Unspecified abdominal pain: Secondary | ICD-10-CM | POA: Diagnosis not present

## 2023-11-27 DIAGNOSIS — K219 Gastro-esophageal reflux disease without esophagitis: Secondary | ICD-10-CM | POA: Diagnosis not present

## 2023-11-27 DIAGNOSIS — R079 Chest pain, unspecified: Secondary | ICD-10-CM | POA: Diagnosis not present

## 2023-11-27 DIAGNOSIS — I2510 Atherosclerotic heart disease of native coronary artery without angina pectoris: Secondary | ICD-10-CM | POA: Diagnosis not present

## 2023-11-27 DIAGNOSIS — K449 Diaphragmatic hernia without obstruction or gangrene: Secondary | ICD-10-CM | POA: Diagnosis not present

## 2023-11-27 DIAGNOSIS — I10 Essential (primary) hypertension: Secondary | ICD-10-CM | POA: Diagnosis not present

## 2023-11-28 DIAGNOSIS — R079 Chest pain, unspecified: Secondary | ICD-10-CM | POA: Diagnosis not present

## 2023-11-28 DIAGNOSIS — R109 Unspecified abdominal pain: Secondary | ICD-10-CM | POA: Diagnosis not present

## 2023-11-28 DIAGNOSIS — K219 Gastro-esophageal reflux disease without esophagitis: Secondary | ICD-10-CM | POA: Diagnosis not present

## 2023-11-28 DIAGNOSIS — K208 Other esophagitis without bleeding: Secondary | ICD-10-CM | POA: Diagnosis not present

## 2023-11-28 DIAGNOSIS — I10 Essential (primary) hypertension: Secondary | ICD-10-CM | POA: Diagnosis not present

## 2023-12-03 ENCOUNTER — Ambulatory Visit: Admitting: Physical Medicine and Rehabilitation

## 2023-12-03 ENCOUNTER — Telehealth: Payer: Self-pay | Admitting: Physical Medicine and Rehabilitation

## 2023-12-03 NOTE — Telephone Encounter (Signed)
 Pt request to reschedule appt

## 2023-12-19 ENCOUNTER — Other Ambulatory Visit: Payer: Self-pay

## 2023-12-19 ENCOUNTER — Ambulatory Visit: Admitting: Physical Medicine and Rehabilitation

## 2023-12-19 DIAGNOSIS — M25551 Pain in right hip: Secondary | ICD-10-CM | POA: Diagnosis not present

## 2023-12-19 MED ORDER — BUPIVACAINE HCL 0.25 % IJ SOLN
4.0000 mL | INTRAMUSCULAR | Status: AC | PRN
  Administered 2023-12-19: 4 mL via INTRA_ARTICULAR

## 2023-12-19 MED ORDER — TRIAMCINOLONE ACETONIDE 40 MG/ML IJ SUSP
40.0000 mg | INTRAMUSCULAR | Status: AC | PRN
Start: 2023-12-19 — End: 2023-12-19
  Administered 2023-12-19: 40 mg via INTRA_ARTICULAR

## 2023-12-19 NOTE — Progress Notes (Signed)
 Pain Scale   Average Pain 6 Patient advising she has Chronic Right Hip pain        +Driver, -BT, -Dye Allergies.

## 2023-12-19 NOTE — Progress Notes (Signed)
 Nancy Simmons - 68 y.o. female MRN [id number]  Date of birth: 1956-01-16  Office Visit Note: Visit Date: 12/19/2023 PCP: Alonso Jan., MD Referred by: Alonso Jan., MD  Subjective: Chief Complaint  Patient presents with   Right Hip - Pain   HPI:  Nancy Simmons is a 68 y.o. female who comes in today for planned repeat Right anesthetic hip arthrogram with fluoroscopic guidance.  The patient has failed conservative care including home exercise, medications, time and activity modification. Prior injection gave more than 50% relief for several months. This injection will be diagnostic and hopefully therapeutic.  Please see requesting physician notes for further details and justification.  Referring: Elvan Hamel, FNP   ROS Otherwise per HPI.  Assessment & Plan: Visit Diagnoses:    ICD-10-CM   1. Pain in right hip  M25.551 XR C-ARM NO REPORT      Plan: No additional findings.   Meds & Orders: No orders of the defined types were placed in this encounter.   Orders Placed This Encounter  Procedures   Large Joint Inj   XR C-ARM NO REPORT    Follow-up: Return if symptoms worsen or fail to improve.   Procedures: Large Joint Inj: R hip joint on 12/19/2023 1:22 PM Indications: diagnostic evaluation and pain Details: 22 G 3.5 in needle, fluoroscopy-guided anterior approach  Arthrogram: No  Medications: 4 mL bupivacaine  0.25 %; 40 mg triamcinolone  acetonide 40 MG/ML Outcome: tolerated well, no immediate complications  There was excellent flow of contrast producing a partial arthrogram of the hip. The patient did have relief of symptoms during the anesthetic phase of the injection. Procedure, treatment alternatives, risks and benefits explained, specific risks discussed. Consent was given by the patient. Immediately prior to procedure a time out was called to verify the correct patient, procedure, equipment, support staff and site/side marked as required. Patient was  prepped and draped in the usual sterile fashion.          Clinical History: No specialty comments available.     Objective:  VS:  HT:    WT:   BMI:     BP:   HR: bpm  TEMP: ( )  RESP:  Physical Exam Vitals and nursing note reviewed.  Constitutional:      General: She is not in acute distress.    Appearance: Normal appearance. She is well-developed. She is obese. She is not ill-appearing.  HENT:     Head: Normocephalic and atraumatic.  Eyes:     Conjunctiva/sclera: Conjunctivae normal.     Pupils: Pupils are equal, round, and reactive to light.  Cardiovascular:     Rate and Rhythm: Normal rate.     Pulses: Normal pulses.  Pulmonary:     Effort: Pulmonary effort is normal.  Musculoskeletal:        General: Tenderness present.     Right lower leg: No edema.     Left lower leg: No edema.  Skin:    General: Skin is warm and dry.     Findings: No erythema or rash.  Neurological:     General: No focal deficit present.     Mental Status: She is alert and oriented to person, place, and time.     Cranial Nerves: No cranial nerve deficit.     Sensory: No sensory deficit.     Motor: No weakness or abnormal muscle tone.     Coordination: Coordination normal.     Gait: Gait abnormal.  Psychiatric:        Mood and Affect: Mood normal.        Behavior: Behavior normal.      Imaging: No results found.

## 2024-01-02 DIAGNOSIS — E559 Vitamin D deficiency, unspecified: Secondary | ICD-10-CM | POA: Diagnosis not present

## 2024-01-02 DIAGNOSIS — K754 Autoimmune hepatitis: Secondary | ICD-10-CM | POA: Diagnosis not present

## 2024-01-02 DIAGNOSIS — Z6841 Body Mass Index (BMI) 40.0 and over, adult: Secondary | ICD-10-CM | POA: Diagnosis not present

## 2024-01-02 DIAGNOSIS — M48061 Spinal stenosis, lumbar region without neurogenic claudication: Secondary | ICD-10-CM | POA: Diagnosis not present

## 2024-01-02 DIAGNOSIS — E1165 Type 2 diabetes mellitus with hyperglycemia: Secondary | ICD-10-CM | POA: Diagnosis not present

## 2024-01-02 DIAGNOSIS — E785 Hyperlipidemia, unspecified: Secondary | ICD-10-CM | POA: Diagnosis not present

## 2024-01-02 DIAGNOSIS — J449 Chronic obstructive pulmonary disease, unspecified: Secondary | ICD-10-CM | POA: Diagnosis not present

## 2024-01-02 DIAGNOSIS — I10 Essential (primary) hypertension: Secondary | ICD-10-CM | POA: Diagnosis not present

## 2024-01-02 DIAGNOSIS — M159 Polyosteoarthritis, unspecified: Secondary | ICD-10-CM | POA: Diagnosis not present

## 2024-02-12 DIAGNOSIS — M4306 Spondylolysis, lumbar region: Secondary | ICD-10-CM | POA: Diagnosis not present

## 2024-02-12 DIAGNOSIS — M25551 Pain in right hip: Secondary | ICD-10-CM | POA: Diagnosis not present

## 2024-02-12 DIAGNOSIS — M542 Cervicalgia: Secondary | ICD-10-CM | POA: Diagnosis not present

## 2024-02-12 DIAGNOSIS — M51362 Other intervertebral disc degeneration, lumbar region with discogenic back pain and lower extremity pain: Secondary | ICD-10-CM | POA: Diagnosis not present

## 2024-02-12 DIAGNOSIS — Z9689 Presence of other specified functional implants: Secondary | ICD-10-CM | POA: Diagnosis not present

## 2024-02-12 DIAGNOSIS — M961 Postlaminectomy syndrome, not elsewhere classified: Secondary | ICD-10-CM | POA: Diagnosis not present

## 2024-02-20 ENCOUNTER — Encounter: Payer: Self-pay | Admitting: Internal Medicine

## 2024-02-20 ENCOUNTER — Other Ambulatory Visit: Payer: Self-pay

## 2024-02-20 ENCOUNTER — Ambulatory Visit: Admitting: Internal Medicine

## 2024-02-20 VITALS — BP 168/76 | HR 68 | Temp 98.6°F | Ht 62.0 in | Wt 287.0 lb

## 2024-02-20 DIAGNOSIS — B9561 Methicillin susceptible Staphylococcus aureus infection as the cause of diseases classified elsewhere: Secondary | ICD-10-CM

## 2024-02-20 DIAGNOSIS — T847XXD Infection and inflammatory reaction due to other internal orthopedic prosthetic devices, implants and grafts, subsequent encounter: Secondary | ICD-10-CM | POA: Diagnosis not present

## 2024-02-20 NOTE — Progress Notes (Signed)
 RFV: follow up for chronic SSI  Patient ID: Nancy Simmons, female   DOB: 1955/10/26, 68 y.o.   MRN: [id number]  HPI [name] is a 68yo F with history of previously underwent an instrumented lumbar fusion surgery followed by an extension of the fusion of L5-S1 with subsequent wound infection. I and D done on 09/24/22 and cultures grew both MSSA and Pseudomonas. She completed prolonged IV cefepime , changed to piperacillin/tazobactam due to hives on cefepime  and transitioned in April 2024 to oral levaquin  + cefadroxil  through 1 year mark April 2025. She is now off of abtx since April 2025. She reports that she had her Last had steroid injection to right hip at end fof may which helped her symptoms. Hx of left THA Had recent dental surgery.   Outpatient Encounter Medications as of 02/20/2024  Medication Sig   albuterol  (PROVENTIL ) (2.5 MG/3ML) 0.083% nebulizer solution Take 2.5 mg by nebulization every 6 (six) hours as needed for wheezing or shortness of breath.   albuterol  (VENTOLIN  HFA) 108 (90 Base) MCG/ACT inhaler Inhale 1-2 puffs into the lungs every 6 (six) hours as needed for wheezing or shortness of breath.   amLODipine  (NORVASC ) 5 MG tablet Take 5 mg by mouth daily.   apixaban  (ELIQUIS ) 5 MG TABS tablet Take 1 tablet (5 mg total) by mouth 2 (two) times daily. Restart on 09/07/2019   carboxymethylcellulose (REFRESH TEARS) 0.5 % SOLN Place 1 drop into both eyes in the morning and at bedtime.   Choline Fenofibrate  (TRILIPIX) 135 MG capsule Take 135 mg by mouth every evening.    clindamycin  (CLEOCIN ) 300 MG capsule Take 1 capsule (300 mg total) by mouth 3 (three) times daily.   diclofenac  sodium (VOLTAREN ) 1 % GEL Apply 1 application topically 4 (four) times daily as needed (for pain).   escitalopram  (LEXAPRO ) 10 MG tablet Take 10 mg by mouth daily.   fluticasone  (FLONASE ) 50 MCG/ACT nasal spray Place 2 sprays into both nostrils daily.   Glycopyrrolate -Formoterol (BEVESPI AEROSPHERE) 9-4.8  MCG/ACT AERO Inhale 2 puffs into the lungs 2 (two) times daily.   hydrALAZINE  (APRESOLINE ) 10 MG tablet Take 10 mg by mouth 4 (four) times daily.   ketoconazole (NIZORAL) 2 % cream Apply 1 Application topically 2 (two) times daily as needed for irritation (under breasts).   linaclotide  (LINZESS ) 145 MCG CAPS capsule Take 145 mcg by mouth daily as needed (constipation).   meclizine  (ANTIVERT ) 25 MG tablet Take 25 mg by mouth every 8 (eight) hours as needed for dizziness.   meloxicam  (MOBIC ) 7.5 MG tablet Take 7.5 mg by mouth daily.   metoprolol  tartrate (LOPRESSOR ) 50 MG tablet Take 50 mg by mouth 2 (two) times daily.   Multiple Vitamins-Minerals (MULTIVITAMIN GUMMIES WOMENS PO) Take 2 each by mouth daily.   oxybutynin (DITROPAN XL) 15 MG 24 hr tablet Take 15 mg by mouth at bedtime.   oxyCODONE  10 MG TABS Take 1 tablet (10 mg total) by mouth every 4 (four) hours as needed for severe pain ((score 7 to 10)).   OZEMPIC, 0.25 OR 0.5 MG/DOSE, 2 MG/3ML SOPN Inject into the skin.   pantoprazole  (PROTONIX ) 40 MG tablet Take 40 mg by mouth daily.   pregabalin  (LYRICA ) 100 MG capsule Take 1 capsule (100 mg total) by mouth 3 (three) times daily. (Patient taking differently: Take 100-200 mg by mouth See admin instructions. 100 mg in the morning, 200 mg at bedtime)   rOPINIRole  (REQUIP ) 1 MG tablet Take 1 mg by mouth at bedtime.  rosuvastatin  (CRESTOR ) 5 MG tablet Take 5 mg by mouth daily.   Suvorexant  (BELSOMRA ) 10 MG TABS Take 10 mg by mouth at bedtime as needed (sleep).   umeclidinium-vilanterol (ANORO ELLIPTA ) 62.5-25 MCG/ACT AEPB Inhale 1 puff into the lungs daily.   Vitamin D , Ergocalciferol , (DRISDOL ) 1.25 MG (50000 UNIT) CAPS capsule Take 50,000 Units by mouth every 7 (seven) days. Sunday   cefadroxil  (DURICEF) 500 MG capsule Take 2 capsules (1,000 mg total) by mouth 2 (two) times daily. (Patient not taking: Reported on 02/20/2024)   ibuprofen  (ADVIL ) 800 MG tablet Take 1 tablet (800 mg total) by mouth  every 8 (eight) hours as needed.   levofloxacin  (LEVAQUIN ) 750 MG tablet Take 1 tablet (750 mg total) by mouth daily. (Patient not taking: Reported on 02/20/2024)   liraglutide  (VICTOZA ) 18 MG/3ML SOPN Inject 1.8 mg into the skin daily.   No facility-administered encounter medications on file as of 02/20/2024.     Patient Active Problem List   Diagnosis Date Noted   Pseudomonas aeruginosa infection 10/29/2022   Staph aureus infection 09/25/2022   Class 3 severe obesity due to excess calories with serious comorbidity and body mass index (BMI) of 50.0 to 59.9 in adult 09/25/2022   Wound infection complicating hardware (HCC) 09/24/2022   Postoperative complication of skin involving drainage from surgical wound 09/21/2022   Status post lumbar spinal fusion 09/10/2022   History of kidney stones 08/27/2022   History of blood transfusion 08/27/2022   GERD (gastroesophageal reflux disease) 08/27/2022   Family history of adverse reaction to anesthesia 08/27/2022   COPD (chronic obstructive pulmonary disease) (HCC) 08/27/2022   Arthritis 08/27/2022   Chest pain, unspecified 06/20/2018   CAD in native artery 06/20/2018   Morbid obesity (HCC) 06/20/2018   History of DVT (deep vein thrombosis) 06/20/2018   Chest pain 06/20/2018   Lumbar herniated disc 05/22/2018   Status post left hip replacement 12/10/2017   Unilateral primary osteoarthritis, left hip 11/04/2017   Abnormal stress echocardiography    Stable angina (HCC) 04/03/2017   DM2 (diabetes mellitus, type 2) (HCC) 04/03/2017   Asthma 04/03/2017   OSA on CPAP 04/03/2017   Angina at rest [facility]) 04/03/2017   Medication monitoring encounter 07/09/2016   Autoimmune hepatitis (HCC) 07/09/2016   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   HTN (hypertension) 09/27/2015   CAD (coronary artery disease) of artery bypass graft 09/27/2015   Hypercholesterolemia 09/27/2015   S/P cholecystectomy 09/27/2015     Health Maintenance  Due  Topic Date Due   FOOT EXAM  Never done   OPHTHALMOLOGY EXAM  Never done   Diabetic kidney evaluation - Urine ACR  Never done   Hepatitis C Screening  Never done   DTaP/Tdap/Td (1 - Tdap) Never done   Zoster Vaccines- Shingrix (1 of 2) Never done   Colonoscopy  Never done   Lung Cancer Screening  Never done   MAMMOGRAM  Never done   DEXA SCAN  Never done   COVID-19 Vaccine (2 - Pfizer risk series) 04/26/2023   HEMOGLOBIN A1C  09/05/2023   Medicare Annual Wellness (AWV)  04/09/2024     Review of Systems Review of Systems  Constitutional: Negative for fever, chills, diaphoresis, activity change, appetite change, fatigue and unexpected weight change.  HENT: Negative for congestion, sore throat, rhinorrhea, sneezing, trouble swallowing and sinus pressure.  Eyes: Negative for photophobia and visual disturbance.  Respiratory: Negative for cough, chest tightness, shortness of breath, wheezing and stridor.  Cardiovascular: Negative for  chest pain, palpitations and leg swelling.  Gastrointestinal: Negative for nausea, vomiting, abdominal pain, diarrhea, constipation, blood in stool, abdominal distention and anal bleeding.  Genitourinary: Negative for dysuria, hematuria, flank pain and difficulty urinating.  Musculoskeletal: Negative for myalgias, back pain, joint swelling, arthralgias and gait problem.  Skin: Negative for color change, pallor, rash and wound.  Neurological: Negative for dizziness, tremors, weakness and light-headedness.  Hematological: Negative for adenopathy. Does not bruise/bleed easily.  Psychiatric/Behavioral: Negative for behavioral problems, confusion, sleep disturbance, dysphoric mood, decreased concentration and agitation.   Physical Exam   BP (!) 168/76   Pulse 68   Temp 98.6 F (37 C) (Oral)   Ht 5' 2 (1.575 m)   Wt 287 lb (130.2 kg)   SpO2 92%   BMI 52.49 kg/m    No results found for: CD4TCELL No results found for: CD4TABS No results found  for: HIV1RNAQUANT No results found for: HEPBSAB No results found for: RPR, LABRPR  CBC Lab Results  Component Value Date   WBC 5.9 07/31/2023   RBC 4.13 07/31/2023   HGB 12.2 07/31/2023   HCT 38.6 07/31/2023   PLT 184 07/31/2023   MCV 93.5 07/31/2023   MCH 29.5 07/31/2023   MCHC 31.6 (L) 07/31/2023   RDW 12.2 07/31/2023   LYMPHSABS 2,344 04/05/2023   MONOABS 1.0 09/21/2022   EOSABS 218 07/31/2023    BMET Lab Results  Component Value Date   NA 144 07/31/2023   K 3.5 07/31/2023   CL 110 07/31/2023   CO2 26 07/31/2023   GLUCOSE 94 07/31/2023   BUN 12 07/31/2023   CREATININE 0.58 07/31/2023   CALCIUM  9.3 07/31/2023   GFRNONAA >60 03/05/2023   GFRAA >60 08/31/2019    Lab Results  Component Value Date   ESRSEDRATE 28 02/20/2024   Lab Results  Component Value Date   CRP <3.0 02/20/2024   Lab Results  Component Value Date   WBC 4.6 02/20/2024   HGB 12.3 02/20/2024   HCT 39.9 02/20/2024   MCV 95.2 02/20/2024   PLT 151 02/20/2024     Assessment and Plan  History of chronic MSSA lumbar fusion SSI = monitor off of abtx presently. Will check inflammatory markers and cbc to see if any signs of recurrence of infection  Addendum= markers are WNL. Plan to see back in 3-4 months and continue off of abtx

## 2024-02-21 LAB — CBC WITH DIFFERENTIAL/PLATELET
Absolute Lymphocytes: 2406 {cells}/uL (ref 850–3900)
Absolute Monocytes: 465 {cells}/uL (ref 200–950)
Basophils Absolute: 18 {cells}/uL (ref 0–200)
Basophils Relative: 0.4 %
Eosinophils Absolute: 152 {cells}/uL (ref 15–500)
Eosinophils Relative: 3.3 %
HCT: 39.9 % (ref 35.0–45.0)
Hemoglobin: 12.3 g/dL (ref 11.7–15.5)
MCH: 29.4 pg (ref 27.0–33.0)
MCHC: 30.8 g/dL — ABNORMAL LOW (ref 32.0–36.0)
MCV: 95.2 fL (ref 80.0–100.0)
MPV: 12.5 fL (ref 7.5–12.5)
Monocytes Relative: 10.1 %
Neutro Abs: 1559 {cells}/uL (ref 1500–7800)
Neutrophils Relative %: 33.9 %
Platelets: 151 Thousand/uL (ref 140–400)
RBC: 4.19 Million/uL (ref 3.80–5.10)
RDW: 13.6 % (ref 11.0–15.0)
Total Lymphocyte: 52.3 %
WBC: 4.6 Thousand/uL (ref 3.8–10.8)

## 2024-02-21 LAB — C-REACTIVE PROTEIN: CRP: <3 mg/L (ref <8.0)

## 2024-02-21 LAB — SEDIMENTATION RATE: Sed Rate: 28 mm/h (ref 0–30)

## 2024-04-02 ENCOUNTER — Telehealth: Payer: Self-pay | Admitting: Physical Medicine and Rehabilitation

## 2024-04-02 ENCOUNTER — Other Ambulatory Visit: Payer: Self-pay | Admitting: Physical Medicine and Rehabilitation

## 2024-04-02 ENCOUNTER — Telehealth: Payer: Self-pay

## 2024-04-02 DIAGNOSIS — M25551 Pain in right hip: Secondary | ICD-10-CM

## 2024-04-02 DIAGNOSIS — M1611 Unilateral primary osteoarthritis, right hip: Secondary | ICD-10-CM

## 2024-04-02 NOTE — Telephone Encounter (Signed)
 Last injection May 2025 % 100 relief/ Function ability Duration of Relief/improvement --3.5 months Current falls or injuries--none Current  pain score--10 Same location and same pain as last time

## 2024-04-02 NOTE — Telephone Encounter (Signed)
 Patient called and wants a hip injection. Needs to be scheduled. CB#[id number]

## 2024-04-14 ENCOUNTER — Ambulatory Visit (INDEPENDENT_AMBULATORY_CARE_PROVIDER_SITE_OTHER): Admitting: Physical Medicine and Rehabilitation

## 2024-04-14 ENCOUNTER — Other Ambulatory Visit: Payer: Self-pay

## 2024-04-14 DIAGNOSIS — M25551 Pain in right hip: Secondary | ICD-10-CM | POA: Diagnosis not present

## 2024-04-14 MED ORDER — BUPIVACAINE HCL 0.25 % IJ SOLN
4.0000 mL | INTRAMUSCULAR | Status: AC | PRN
  Administered 2024-04-14: 4 mL via INTRA_ARTICULAR

## 2024-04-14 MED ORDER — TRIAMCINOLONE ACETONIDE 40 MG/ML IJ SUSP
40.0000 mg | INTRAMUSCULAR | Status: AC | PRN
  Administered 2024-04-14: 40 mg via INTRA_ARTICULAR

## 2024-04-14 NOTE — Progress Notes (Signed)
 Pain Scale   Average Pain 10 Patient advising she has right hip pain that is constant without relief        +Driver, -BT, -Dye Allergies.

## 2024-04-14 NOTE — Progress Notes (Signed)
   Nancy Simmons - 68 y.o. female MRN [id number]  Date of birth: [birth year]  Office Visit Note: Visit Date: 04/14/2024 PCP: Elaine Garnette BIRCH., MD Referred by: Elaine Garnette BIRCH., MD  Subjective: Chief Complaint  Patient presents with   Right Hip - Pain   HPI:  Nancy Simmons is a 68 y.o. female who comes in today for planned repeat Right anesthetic hip arthrogram with fluoroscopic guidance.  The patient has failed conservative care including home exercise, medications, time and activity modification. Prior injection gave more than 50% relief for several months. This injection will be diagnostic and hopefully therapeutic.  Please see requesting physician notes for further details and justification.  This will be third injection this year.  Will need follow-up with Dr. Dante.  Referring: Duwaine Pouch, FNP   ROS Otherwise per HPI.  Assessment & Plan: Visit Diagnoses:    ICD-10-CM   1. Pain in right hip  M25.551 XR C-ARM NO REPORT      Plan: No additional findings.   Meds & Orders: No orders of the defined types were placed in this encounter.   Orders Placed This Encounter  Procedures   Large Joint Inj   XR C-ARM NO REPORT    Follow-up: Return for visit to requesting provider as needed.   Procedures: Large Joint Inj: R hip joint on 04/14/2024 9:33 AM Indications: diagnostic evaluation and pain Details: 22 G 3.5 in needle, fluoroscopy-guided anterior approach  Arthrogram: No  Medications: 4 mL bupivacaine  0.25 %; 40 mg triamcinolone  acetonide 40 MG/ML Outcome: tolerated well, no immediate complications  There was excellent flow of contrast producing a partial arthrogram of the hip. The patient did have relief of symptoms during the anesthetic phase of the injection. Procedure, treatment alternatives, risks and benefits explained, specific risks discussed. Consent was given by the patient. Immediately prior to procedure a time out was called to verify the correct patient,  procedure, equipment, support staff and site/side marked as required. Patient was prepped and draped in the usual sterile fashion.          Clinical History: No specialty comments available.     Objective:  VS:  HT:    WT:   BMI:     BP:   HR: bpm  TEMP: ( )  RESP:  Physical Exam   Imaging: No results found.

## 2024-04-16 ENCOUNTER — Encounter: Admitting: Physical Medicine and Rehabilitation

## 2024-05-06 DIAGNOSIS — J449 Chronic obstructive pulmonary disease, unspecified: Secondary | ICD-10-CM | POA: Diagnosis not present

## 2024-05-06 DIAGNOSIS — E1165 Type 2 diabetes mellitus with hyperglycemia: Secondary | ICD-10-CM | POA: Diagnosis not present

## 2024-05-06 DIAGNOSIS — I10 Essential (primary) hypertension: Secondary | ICD-10-CM | POA: Diagnosis not present

## 2024-05-06 DIAGNOSIS — E559 Vitamin D deficiency, unspecified: Secondary | ICD-10-CM | POA: Diagnosis not present

## 2024-05-06 DIAGNOSIS — M48061 Spinal stenosis, lumbar region without neurogenic claudication: Secondary | ICD-10-CM | POA: Diagnosis not present

## 2024-05-06 DIAGNOSIS — E785 Hyperlipidemia, unspecified: Secondary | ICD-10-CM | POA: Diagnosis not present

## 2024-05-06 DIAGNOSIS — N390 Urinary tract infection, site not specified: Secondary | ICD-10-CM | POA: Diagnosis not present

## 2024-05-06 DIAGNOSIS — G4700 Insomnia, unspecified: Secondary | ICD-10-CM | POA: Diagnosis not present

## 2024-05-06 DIAGNOSIS — Z23 Encounter for immunization: Secondary | ICD-10-CM | POA: Diagnosis not present

## 2024-05-06 DIAGNOSIS — M159 Polyosteoarthritis, unspecified: Secondary | ICD-10-CM | POA: Diagnosis not present

## 2024-05-06 DIAGNOSIS — K754 Autoimmune hepatitis: Secondary | ICD-10-CM | POA: Diagnosis not present

## 2024-05-21 ENCOUNTER — Other Ambulatory Visit: Payer: Self-pay

## 2024-05-21 ENCOUNTER — Encounter: Payer: Self-pay | Admitting: Internal Medicine

## 2024-05-21 ENCOUNTER — Ambulatory Visit: Admitting: Internal Medicine

## 2024-05-21 VITALS — BP 138/87 | HR 87

## 2024-05-21 DIAGNOSIS — T8463XD Infection and inflammatory reaction due to internal fixation device of spine, subsequent encounter: Secondary | ICD-10-CM | POA: Diagnosis not present

## 2024-05-21 DIAGNOSIS — T847XXD Infection and inflammatory reaction due to other internal orthopedic prosthetic devices, implants and grafts, subsequent encounter: Secondary | ICD-10-CM

## 2024-05-21 NOTE — Progress Notes (Signed)
 Patient ID: Nancy Simmons, female   DOB: May 20, 1956, 68 y.o.   MRN: [id number]  HPI  Nancy Simmons is a 68yo F with history of previously underwent an instrumented lumbar fusion surgery followed by an extension of the fusion of L5-S1 with subsequent wound infection. I and D done on 09/24/22 and cultures grew both MSSA and Pseudomonas. She completed prolonged IV cefepime , changed to piperacillin/tazobactam due to hives on cefepime  and transitioned in April 2024 to oral levaquin  + cefadroxil  through 1 year mark April 2025. She is now off of abtx since April 2025.  She feels that her back pain exacerbated when she is standing, but it improves if she bends forward. Sitting appears to be fine.   She had 3 family members died this spring. Including her daughter (pancreatic ca) and her brother who died of metastatic colon ca. Initially she felt numb but now improving back to her baseline.  Lab Results  Component Value Date   ESRSEDRATE 28 02/20/2024     Outpatient Encounter Medications as of 05/21/2024  Medication Sig   albuterol  (PROVENTIL ) (2.5 MG/3ML) 0.083% nebulizer solution Take 2.5 mg by nebulization every 6 (six) hours as needed for wheezing or shortness of breath.   albuterol  (VENTOLIN  HFA) 108 (90 Base) MCG/ACT inhaler Inhale 1-2 puffs into the lungs every 6 (six) hours as needed for wheezing or shortness of breath.   amLODipine  (NORVASC ) 5 MG tablet Take 5 mg by mouth daily.   apixaban  (ELIQUIS ) 5 MG TABS tablet Take 1 tablet (5 mg total) by mouth 2 (two) times daily. Restart on 09/07/2019   carboxymethylcellulose (REFRESH TEARS) 0.5 % SOLN Place 1 drop into both eyes in the morning and at bedtime.   cefadroxil  (DURICEF) 500 MG capsule Take 2 capsules (1,000 mg total) by mouth 2 (two) times daily. (Patient not taking: Reported on 02/20/2024)   Choline Fenofibrate  (TRILIPIX) 135 MG capsule Take 135 mg by mouth every evening.    clindamycin  (CLEOCIN ) 300 MG capsule Take 1 capsule (300 mg total)  by mouth 3 (three) times daily.   diclofenac  sodium (VOLTAREN ) 1 % GEL Apply 1 application topically 4 (four) times daily as needed (for pain).   escitalopram  (LEXAPRO ) 10 MG tablet Take 10 mg by mouth daily.   fluticasone  (FLONASE ) 50 MCG/ACT nasal spray Place 2 sprays into both nostrils daily.   Glycopyrrolate -Formoterol (BEVESPI AEROSPHERE) 9-4.8 MCG/ACT AERO Inhale 2 puffs into the lungs 2 (two) times daily.   hydrALAZINE  (APRESOLINE ) 10 MG tablet Take 10 mg by mouth 4 (four) times daily.   ibuprofen  (ADVIL ) 800 MG tablet Take 1 tablet (800 mg total) by mouth every 8 (eight) hours as needed.   ketoconazole (NIZORAL) 2 % cream Apply 1 Application topically 2 (two) times daily as needed for irritation (under breasts).   levofloxacin  (LEVAQUIN ) 750 MG tablet Take 1 tablet (750 mg total) by mouth daily. (Patient not taking: Reported on 02/20/2024)   linaclotide  (LINZESS ) 145 MCG CAPS capsule Take 145 mcg by mouth daily as needed (constipation).   liraglutide  (VICTOZA ) 18 MG/3ML SOPN Inject 1.8 mg into the skin daily.   meclizine  (ANTIVERT ) 25 MG tablet Take 25 mg by mouth every 8 (eight) hours as needed for dizziness.   meloxicam  (MOBIC ) 7.5 MG tablet Take 7.5 mg by mouth daily.   metoprolol  tartrate (LOPRESSOR ) 50 MG tablet Take 50 mg by mouth 2 (two) times daily.   Multiple Vitamins-Minerals (MULTIVITAMIN GUMMIES WOMENS PO) Take 2 each by mouth daily.   oxybutynin (DITROPAN  XL) 15 MG 24 hr tablet Take 15 mg by mouth at bedtime.   oxyCODONE  10 MG TABS Take 1 tablet (10 mg total) by mouth every 4 (four) hours as needed for severe pain ((score 7 to 10)).   OZEMPIC, 0.25 OR 0.5 MG/DOSE, 2 MG/3ML SOPN Inject into the skin.   pantoprazole  (PROTONIX ) 40 MG tablet Take 40 mg by mouth daily.   pregabalin  (LYRICA ) 100 MG capsule Take 1 capsule (100 mg total) by mouth 3 (three) times daily. (Patient taking differently: Take 100-200 mg by mouth See admin instructions. 100 mg in the morning, 200 mg at  bedtime)   rOPINIRole  (REQUIP ) 1 MG tablet Take 1 mg by mouth at bedtime.   rosuvastatin  (CRESTOR ) 5 MG tablet Take 5 mg by mouth daily.   Suvorexant  (BELSOMRA ) 10 MG TABS Take 10 mg by mouth at bedtime as needed (sleep).   umeclidinium-vilanterol (ANORO ELLIPTA ) 62.5-25 MCG/ACT AEPB Inhale 1 puff into the lungs daily.   Vitamin D , Ergocalciferol , (DRISDOL ) 1.25 MG (50000 UNIT) CAPS capsule Take 50,000 Units by mouth every 7 (seven) days. Sunday   No facility-administered encounter medications on file as of 05/21/2024.     Patient Active Problem List   Diagnosis Date Noted   Pseudomonas aeruginosa infection 10/29/2022   Staph aureus infection 09/25/2022   Class 3 severe obesity due to excess calories with serious comorbidity and body mass index (BMI) of 50.0 to 59.9 in adult (HCC) 09/25/2022   Wound infection complicating hardware 09/24/2022   Postoperative complication of skin involving drainage from surgical wound 09/21/2022   Status post lumbar spinal fusion 09/10/2022   History of kidney stones 08/27/2022   History of blood transfusion 08/27/2022   GERD (gastroesophageal reflux disease) 08/27/2022   Family history of adverse reaction to anesthesia 08/27/2022   COPD (chronic obstructive pulmonary disease) (HCC) 08/27/2022   Arthritis 08/27/2022   Chest pain, unspecified 06/20/2018   CAD in native artery 06/20/2018   Morbid obesity (HCC) 06/20/2018   History of DVT (deep vein thrombosis) 06/20/2018   Chest pain 06/20/2018   Lumbar herniated disc 05/22/2018   Status post left hip replacement 12/10/2017   Unilateral primary osteoarthritis, left hip 11/04/2017   Abnormal stress echocardiography    Stable angina 04/03/2017   DM2 (diabetes mellitus, type 2) (HCC) 04/03/2017   Asthma 04/03/2017   OSA on CPAP 04/03/2017   Angina at rest 04/03/2017   Medication monitoring encounter 07/09/2016   Autoimmune hepatitis (HCC) 07/09/2016   Infection of prosthetic right knee joint  09/27/2015   HTN (hypertension) 09/27/2015   CAD (coronary artery disease) of artery bypass graft 09/27/2015   Hypercholesterolemia 09/27/2015   S/P cholecystectomy 09/27/2015     Health Maintenance Due  Topic Date Due   FOOT EXAM  Never done   Diabetic kidney evaluation - Urine ACR  Never done   Hepatitis C Screening  Never done   DTaP/Tdap/Td (1 - Tdap) Never done   Zoster Vaccines- Shingrix (1 of 2) Never done   Mammogram  Never done   Colonoscopy  Never done   Lung Cancer Screening  Never done   DEXA SCAN  Never done   COVID-19 Vaccine (2 - Pfizer risk series) 04/26/2023   HEMOGLOBIN A1C  09/05/2023   Influenza Vaccine  02/21/2024   Medicare Annual Wellness (AWV)  04/09/2024     Review of Systems 12 point ros is otherwise negative Physical Exam  BP 138/87   Pulse 87   SpO2 92%  Physical Exam  Constitutional:  oriented to person, place, and time. appears well-developed and well-nourished. No distress.  HENT: [place]/AT, PERRLA, no scleral icterus Mouth/Throat: Oropharynx is clear and moist. No oropharyngeal exudate.  Cardiovascular: Normal rate, regular rhythm and normal heart sounds. Exam reveals no gallop and no friction rub.  No murmur heard.  Pulmonary/Chest: Effort normal and breath sounds normal. No respiratory distress.  has no wheezes.  Neck = supple, no nuchal rigidity Abdominal: Soft. Bowel sounds are normal.  exhibits no distension. There is no tenderness.  Lymphadenopathy: no cervical adenopathy. No axillary adenopathy Neurological: alert and oriented to person, place, and time.  Skin: Skin is warm and dry. No rash noted. No erythema.  Psychiatric: a normal mood and affect.  behavior is normal.   CBC Lab Results  Component Value Date   WBC 4.6 02/20/2024   RBC 4.19 02/20/2024   HGB 12.3 02/20/2024   HCT 39.9 02/20/2024   PLT 151 02/20/2024   MCV 95.2 02/20/2024   MCH 29.4 02/20/2024   MCHC 30.8 (L) 02/20/2024   RDW 13.6 02/20/2024   LYMPHSABS 2,344  04/05/2023   MONOABS 1.0 09/21/2022   EOSABS 152 02/20/2024    BMET Lab Results  Component Value Date   NA 144 07/31/2023   K 3.5 07/31/2023   CL 110 07/31/2023   CO2 26 07/31/2023   GLUCOSE 94 07/31/2023   BUN 12 07/31/2023   CREATININE 0.58 07/31/2023   CALCIUM  9.3 07/31/2023   GFRNONAA >60 03/05/2023   GFRAA >60 08/31/2019    Lab Results  Component Value Date   ESRSEDRATE 28 05/21/2024   Lab Results  Component Value Date   CRP 5.2 05/21/2024     Assessment and Plan Lumbar fusion ssi, polymicrobial = extended period of treatment. Now being monitored off of abtx. Today, Will do bloodwork -check crp and sed rate to compare to previous results. Appears stable  Rtc 3 month Health maintenance= Already vaccinated

## 2024-05-22 LAB — BASIC METABOLIC PANEL WITH GFR
BUN: 15 mg/dL (ref 7–25)
CO2: 29 mmol/L (ref 20–32)
Calcium: 9.2 mg/dL (ref 8.6–10.4)
Chloride: 108 mmol/L (ref 98–110)
Creat: 0.61 mg/dL (ref 0.50–1.05)
Glucose, Bld: 167 mg/dL — ABNORMAL HIGH (ref 65–99)
Potassium: 4 mmol/L (ref 3.5–5.3)
Sodium: 143 mmol/L (ref 135–146)
eGFR: 97 mL/min/1.73m2 (ref >=60)

## 2024-05-22 LAB — C-REACTIVE PROTEIN: CRP: 5.2 mg/L (ref <8.0)

## 2024-05-22 LAB — CBC WITH DIFFERENTIAL/PLATELET
Absolute Lymphocytes: 2116 {cells}/uL (ref 850–3900)
Absolute Monocytes: 515 {cells}/uL (ref 200–950)
Basophils Absolute: 51 {cells}/uL (ref 0–200)
Basophils Relative: 1.1 %
Eosinophils Absolute: 147 {cells}/uL (ref 15–500)
Eosinophils Relative: 3.2 %
HCT: 39.3 % (ref 35.0–45.0)
Hemoglobin: 12.3 g/dL (ref 11.7–15.5)
MCH: 30.2 pg (ref 27.0–33.0)
MCHC: 31.3 g/dL — ABNORMAL LOW (ref 32.0–36.0)
MCV: 96.6 fL (ref 80.0–100.0)
MPV: 13.9 fL — ABNORMAL HIGH (ref 7.5–12.5)
Monocytes Relative: 11.2 %
Neutro Abs: 1771 {cells}/uL (ref 1500–7800)
Neutrophils Relative %: 38.5 %
Platelets: 155 Thousand/uL (ref 140–400)
RBC: 4.07 Million/uL (ref 3.80–5.10)
RDW: 13.2 % (ref 11.0–15.0)
Total Lymphocyte: 46 %
WBC: 4.6 Thousand/uL (ref 3.8–10.8)

## 2024-05-22 LAB — SEDIMENTATION RATE: Sed Rate: 28 mm/h (ref 0–30)

## 2024-05-25 ENCOUNTER — Encounter: Payer: Self-pay | Admitting: Radiology

## 2024-07-28 ENCOUNTER — Other Ambulatory Visit: Payer: Self-pay | Admitting: Physical Medicine and Rehabilitation

## 2024-07-28 ENCOUNTER — Telehealth: Payer: Self-pay | Admitting: Physical Medicine and Rehabilitation

## 2024-07-28 DIAGNOSIS — M25551 Pain in right hip: Secondary | ICD-10-CM

## 2024-07-28 NOTE — Telephone Encounter (Signed)
 Patient called. She would like an injection.

## 2024-08-04 ENCOUNTER — Ambulatory Visit: Admitting: Physical Medicine and Rehabilitation

## 2024-08-04 ENCOUNTER — Other Ambulatory Visit: Payer: Self-pay

## 2024-08-04 DIAGNOSIS — M25551 Pain in right hip: Secondary | ICD-10-CM | POA: Diagnosis not present

## 2024-08-04 NOTE — Progress Notes (Signed)
" ° °  Nancy Simmons - 69 y.o. female MRN [id number]  Date of birth: February 01, 1956  Office Visit Note: Visit Date: 08/04/2024 PCP: Elaine Garnette BIRCH., MD Referred by: Elaine Garnette BIRCH., MD  Subjective: Chief Complaint  Patient presents with   Right Hip - Pain   HPI:  Nancy Simmons is a 69 y.o. female who comes in today at the request of Duwaine Pouch, FNP for planned Right anesthetic hip arthrogram with fluoroscopic guidance.  The patient has failed conservative care including home exercise, medications, time and activity modification.  This injection will be diagnostic and hopefully therapeutic.  Please see requesting physician notes for further details and justification.   ROS Otherwise per HPI.  Assessment & Plan: Visit Diagnoses:    ICD-10-CM   1. Pain in right hip  M25.551 Large Joint Inj    XR C-ARM NO REPORT      Plan: No additional findings.   Meds & Orders: No orders of the defined types were placed in this encounter.   Orders Placed This Encounter  Procedures   Large Joint Inj   XR C-ARM NO REPORT    Follow-up: Return for visit to requesting provider as needed.   Procedures: Large Joint Inj on 08/04/2024 3:48 PM Indications: diagnostic evaluation and pain Details: 22 G 3.5 in needle, fluoroscopy-guided anterior approach  Arthrogram: No  Medications: 4 mL bupivacaine  0.25 %; 40 mg triamcinolone  acetonide 40 MG/ML Outcome: tolerated well, no immediate complications  There was excellent flow of contrast producing a partial arthrogram of the hip. The patient did have relief of symptoms during the anesthetic phase of the injection. Procedure, treatment alternatives, risks and benefits explained, specific risks discussed. Consent was given by the patient. Immediately prior to procedure a time out was called to verify the correct patient, procedure, equipment, support staff and site/side marked as required. Patient was prepped and draped in the usual sterile fashion.           Clinical History: No specialty comments available.     Objective:  VS:  HT:    WT:   BMI:     BP:   HR: bpm  TEMP: ( )  RESP:  Physical Exam   Imaging: No results found. "

## 2024-08-04 NOTE — Progress Notes (Signed)
 Pain Scale   Average Pain 10 Patient advising she has Chronic right hip pain that radiates to right leg pain is constant.        +Driver, -BT, -Dye Allergies.

## 2024-08-16 MED ORDER — TRIAMCINOLONE ACETONIDE 40 MG/ML IJ SUSP
40.0000 mg | INTRAMUSCULAR | Status: AC | PRN
  Administered 2024-08-04: 40 mg via INTRA_ARTICULAR

## 2024-08-16 MED ORDER — BUPIVACAINE HCL 0.25 % IJ SOLN
4.0000 mL | INTRAMUSCULAR | Status: AC | PRN
  Administered 2024-08-04: 4 mL via INTRA_ARTICULAR

## 2024-08-27 ENCOUNTER — Ambulatory Visit: Admitting: Internal Medicine
